# Patient Record
Sex: Female | Born: 1935 | Race: Black or African American | Hispanic: No | Smoking: Former smoker
Health system: Southern US, Community
[De-identification: ages and names within clinical notes are randomized; demographics above are authoritative.]

## PROBLEM LIST (undated history)

## (undated) DIAGNOSIS — I739 Peripheral vascular disease, unspecified: Secondary | ICD-10-CM

## (undated) DIAGNOSIS — R7989 Other specified abnormal findings of blood chemistry: Secondary | ICD-10-CM

## (undated) DIAGNOSIS — I10 Essential (primary) hypertension: Secondary | ICD-10-CM

## (undated) DIAGNOSIS — E876 Hypokalemia: Secondary | ICD-10-CM

## (undated) DIAGNOSIS — R001 Bradycardia, unspecified: Secondary | ICD-10-CM

## (undated) DIAGNOSIS — E039 Hypothyroidism, unspecified: Secondary | ICD-10-CM

## (undated) DIAGNOSIS — I2699 Other pulmonary embolism without acute cor pulmonale: Secondary | ICD-10-CM

## (undated) DIAGNOSIS — D631 Anemia in chronic kidney disease: Secondary | ICD-10-CM

## (undated) DIAGNOSIS — I714 Abdominal aortic aneurysm, without rupture: Secondary | ICD-10-CM

## (undated) DIAGNOSIS — F418 Other specified anxiety disorders: Secondary | ICD-10-CM

## (undated) DIAGNOSIS — F32A Depression, unspecified: Secondary | ICD-10-CM

## (undated) DIAGNOSIS — F419 Anxiety disorder, unspecified: Secondary | ICD-10-CM

## (undated) DIAGNOSIS — N186 End stage renal disease: Secondary | ICD-10-CM

## (undated) DIAGNOSIS — N039 Chronic nephritic syndrome with unspecified morphologic changes: Secondary | ICD-10-CM

## (undated) DIAGNOSIS — M48061 Spinal stenosis, lumbar region without neurogenic claudication: Secondary | ICD-10-CM

## (undated) DIAGNOSIS — K449 Diaphragmatic hernia without obstruction or gangrene: Secondary | ICD-10-CM

## (undated) DIAGNOSIS — D7589 Other specified diseases of blood and blood-forming organs: Secondary | ICD-10-CM

## (undated) DIAGNOSIS — F329 Major depressive disorder, single episode, unspecified: Secondary | ICD-10-CM

## (undated) DIAGNOSIS — D509 Iron deficiency anemia, unspecified: Secondary | ICD-10-CM

## (undated) DIAGNOSIS — D7289 Other specified disorders of white blood cells: Secondary | ICD-10-CM

## (undated) DIAGNOSIS — M1990 Unspecified osteoarthritis, unspecified site: Secondary | ICD-10-CM

## (undated) DIAGNOSIS — Z87898 Personal history of other specified conditions: Secondary | ICD-10-CM

## (undated) HISTORY — PX: DG AV DIALYSIS GRAFT DECLOT OR: HXRAD813

## (undated) HISTORY — DX: Personal history of other specified conditions: Z87.898

## (undated) HISTORY — DX: Bradycardia, unspecified: R00.1

## (undated) HISTORY — DX: Other specified diseases of blood and blood-forming organs: D75.89

## (undated) HISTORY — DX: Anemia in chronic kidney disease: D63.1

## (undated) HISTORY — DX: Other specified disorders of white blood cells: D72.89

## (undated) HISTORY — DX: Spinal stenosis, lumbar region without neurogenic claudication: M48.061

## (undated) HISTORY — DX: Anemia in chronic kidney disease: N03.9

## (undated) HISTORY — DX: Hypokalemia: E87.6

## (undated) HISTORY — PX: ABDOMINAL SURGERY: SHX537

## (undated) HISTORY — DX: Anxiety disorder, unspecified: F41.9

## (undated) HISTORY — DX: Other specified abnormal findings of blood chemistry: R79.89

## (undated) HISTORY — DX: Major depressive disorder, single episode, unspecified: F32.9

## (undated) HISTORY — DX: Depression, unspecified: F32.A

---

## 1997-03-08 DIAGNOSIS — I714 Abdominal aortic aneurysm, without rupture, unspecified: Secondary | ICD-10-CM

## 1997-03-08 HISTORY — DX: Abdominal aortic aneurysm, without rupture, unspecified: I71.40

## 1997-03-08 HISTORY — DX: Abdominal aortic aneurysm, without rupture: I71.4

## 1997-06-28 ENCOUNTER — Ambulatory Visit (HOSPITAL_COMMUNITY): Admission: RE | Admit: 1997-06-28 | Discharge: 1997-06-28 | Payer: Self-pay | Admitting: Family Medicine

## 1997-08-22 ENCOUNTER — Other Ambulatory Visit: Admission: RE | Admit: 1997-08-22 | Discharge: 1997-08-22 | Payer: Self-pay | Admitting: Family Medicine

## 2001-10-09 ENCOUNTER — Emergency Department (HOSPITAL_COMMUNITY): Admission: EM | Admit: 2001-10-09 | Discharge: 2001-10-09 | Payer: Self-pay | Admitting: Emergency Medicine

## 2001-10-10 ENCOUNTER — Emergency Department (HOSPITAL_COMMUNITY): Admission: EM | Admit: 2001-10-10 | Discharge: 2001-10-10 | Payer: Self-pay | Admitting: Emergency Medicine

## 2001-10-10 ENCOUNTER — Encounter: Payer: Self-pay | Admitting: Emergency Medicine

## 2001-10-21 ENCOUNTER — Encounter: Payer: Self-pay | Admitting: Emergency Medicine

## 2001-10-21 ENCOUNTER — Emergency Department (HOSPITAL_COMMUNITY): Admission: EM | Admit: 2001-10-21 | Discharge: 2001-10-21 | Payer: Self-pay | Admitting: Emergency Medicine

## 2003-03-06 ENCOUNTER — Ambulatory Visit (HOSPITAL_COMMUNITY): Admission: RE | Admit: 2003-03-06 | Discharge: 2003-03-06 | Payer: Self-pay | Admitting: Internal Medicine

## 2003-11-02 ENCOUNTER — Emergency Department (HOSPITAL_COMMUNITY): Admission: EM | Admit: 2003-11-02 | Discharge: 2003-11-02 | Payer: Self-pay | Admitting: Emergency Medicine

## 2003-11-07 ENCOUNTER — Emergency Department (HOSPITAL_COMMUNITY): Admission: EM | Admit: 2003-11-07 | Discharge: 2003-11-07 | Payer: Self-pay | Admitting: Emergency Medicine

## 2003-11-12 ENCOUNTER — Inpatient Hospital Stay (HOSPITAL_COMMUNITY): Admission: EM | Admit: 2003-11-12 | Discharge: 2003-11-20 | Payer: Self-pay | Admitting: Emergency Medicine

## 2003-11-22 ENCOUNTER — Encounter (HOSPITAL_COMMUNITY): Admission: RE | Admit: 2003-11-22 | Discharge: 2004-02-20 | Payer: Self-pay | Admitting: Endocrinology

## 2003-11-25 ENCOUNTER — Ambulatory Visit: Payer: Self-pay | Admitting: *Deleted

## 2003-12-26 ENCOUNTER — Emergency Department (HOSPITAL_COMMUNITY): Admission: EM | Admit: 2003-12-26 | Discharge: 2003-12-26 | Payer: Self-pay | Admitting: Emergency Medicine

## 2004-03-13 ENCOUNTER — Ambulatory Visit: Payer: Self-pay | Admitting: *Deleted

## 2004-03-13 ENCOUNTER — Ambulatory Visit: Payer: Self-pay | Admitting: Nurse Practitioner

## 2004-03-31 ENCOUNTER — Ambulatory Visit: Payer: Self-pay | Admitting: Nurse Practitioner

## 2004-06-11 ENCOUNTER — Ambulatory Visit: Payer: Self-pay | Admitting: Nurse Practitioner

## 2004-06-12 ENCOUNTER — Ambulatory Visit: Payer: Self-pay | Admitting: Internal Medicine

## 2004-08-12 ENCOUNTER — Other Ambulatory Visit: Admission: RE | Admit: 2004-08-12 | Discharge: 2004-08-12 | Payer: Self-pay | Admitting: Internal Medicine

## 2004-08-14 ENCOUNTER — Ambulatory Visit (HOSPITAL_COMMUNITY): Admission: RE | Admit: 2004-08-14 | Discharge: 2004-08-14 | Payer: Self-pay | Admitting: Internal Medicine

## 2005-10-07 ENCOUNTER — Ambulatory Visit (HOSPITAL_COMMUNITY): Admission: RE | Admit: 2005-10-07 | Discharge: 2005-10-07 | Payer: Self-pay | Admitting: Internal Medicine

## 2005-10-19 ENCOUNTER — Ambulatory Visit: Payer: Self-pay | Admitting: Cardiology

## 2005-11-29 ENCOUNTER — Ambulatory Visit: Payer: Self-pay | Admitting: Cardiology

## 2006-11-01 ENCOUNTER — Ambulatory Visit (HOSPITAL_COMMUNITY): Admission: RE | Admit: 2006-11-01 | Discharge: 2006-11-01 | Payer: Self-pay | Admitting: Internal Medicine

## 2007-08-16 ENCOUNTER — Ambulatory Visit (HOSPITAL_COMMUNITY): Admission: RE | Admit: 2007-08-16 | Discharge: 2007-08-16 | Payer: Self-pay | Admitting: Internal Medicine

## 2007-12-01 ENCOUNTER — Ambulatory Visit (HOSPITAL_COMMUNITY): Admission: RE | Admit: 2007-12-01 | Discharge: 2007-12-01 | Payer: Self-pay | Admitting: Internal Medicine

## 2008-07-31 ENCOUNTER — Encounter: Payer: Self-pay | Admitting: Physician Assistant

## 2008-09-05 ENCOUNTER — Emergency Department (HOSPITAL_COMMUNITY): Admission: EM | Admit: 2008-09-05 | Discharge: 2008-09-05 | Payer: Self-pay | Admitting: Emergency Medicine

## 2008-12-17 ENCOUNTER — Encounter: Admission: RE | Admit: 2008-12-17 | Discharge: 2008-12-17 | Payer: Self-pay | Admitting: Endocrinology

## 2008-12-18 ENCOUNTER — Inpatient Hospital Stay (HOSPITAL_COMMUNITY): Admission: EM | Admit: 2008-12-18 | Discharge: 2008-12-23 | Payer: Self-pay | Admitting: Emergency Medicine

## 2009-04-16 ENCOUNTER — Inpatient Hospital Stay (HOSPITAL_COMMUNITY): Admission: EM | Admit: 2009-04-16 | Discharge: 2009-04-26 | Payer: Self-pay | Admitting: Emergency Medicine

## 2009-04-17 ENCOUNTER — Ambulatory Visit: Payer: Self-pay | Admitting: Surgery

## 2009-04-18 ENCOUNTER — Encounter (INDEPENDENT_AMBULATORY_CARE_PROVIDER_SITE_OTHER): Payer: Self-pay | Admitting: Internal Medicine

## 2009-04-18 ENCOUNTER — Encounter: Payer: Self-pay | Admitting: Surgery

## 2009-04-18 HISTORY — PX: OTHER SURGICAL HISTORY: SHX169

## 2009-04-21 ENCOUNTER — Encounter (INDEPENDENT_AMBULATORY_CARE_PROVIDER_SITE_OTHER): Payer: Self-pay | Admitting: Internal Medicine

## 2009-06-18 ENCOUNTER — Ambulatory Visit (HOSPITAL_COMMUNITY): Admission: RE | Admit: 2009-06-18 | Discharge: 2009-06-18 | Payer: Self-pay | Admitting: Vascular Surgery

## 2009-06-18 ENCOUNTER — Ambulatory Visit: Payer: Self-pay | Admitting: Vascular Surgery

## 2009-08-07 ENCOUNTER — Ambulatory Visit (HOSPITAL_COMMUNITY): Admission: RE | Admit: 2009-08-07 | Discharge: 2009-08-07 | Payer: Self-pay | Admitting: Nephrology

## 2009-08-28 ENCOUNTER — Ambulatory Visit (HOSPITAL_COMMUNITY): Admission: AD | Admit: 2009-08-28 | Discharge: 2009-08-28 | Payer: Self-pay | Admitting: Vascular Surgery

## 2009-08-28 ENCOUNTER — Ambulatory Visit: Payer: Self-pay | Admitting: Vascular Surgery

## 2009-09-30 ENCOUNTER — Ambulatory Visit (HOSPITAL_COMMUNITY): Admission: RE | Admit: 2009-09-30 | Discharge: 2009-09-30 | Payer: Self-pay | Admitting: Nephrology

## 2009-11-15 ENCOUNTER — Ambulatory Visit: Payer: Self-pay | Admitting: Vascular Surgery

## 2009-11-15 ENCOUNTER — Ambulatory Visit (HOSPITAL_COMMUNITY): Admission: RE | Admit: 2009-11-15 | Discharge: 2009-11-15 | Payer: Self-pay | Admitting: Vascular Surgery

## 2009-11-28 ENCOUNTER — Emergency Department (HOSPITAL_COMMUNITY): Admission: EM | Admit: 2009-11-28 | Discharge: 2009-11-28 | Payer: Self-pay | Admitting: Emergency Medicine

## 2009-12-02 HISTORY — PX: AVGG REMOVAL: SHX5153

## 2009-12-03 ENCOUNTER — Ambulatory Visit (HOSPITAL_COMMUNITY): Admission: RE | Admit: 2009-12-03 | Discharge: 2009-12-03 | Payer: Self-pay | Admitting: Vascular Surgery

## 2009-12-14 ENCOUNTER — Other Ambulatory Visit: Payer: Self-pay | Admitting: Emergency Medicine

## 2009-12-14 ENCOUNTER — Inpatient Hospital Stay (HOSPITAL_COMMUNITY): Admission: EM | Admit: 2009-12-14 | Discharge: 2009-12-16 | Payer: Self-pay | Admitting: Internal Medicine

## 2009-12-22 ENCOUNTER — Ambulatory Visit: Payer: Self-pay | Admitting: Surgery

## 2010-01-09 ENCOUNTER — Ambulatory Visit: Payer: Self-pay | Admitting: Surgery

## 2010-01-09 ENCOUNTER — Ambulatory Visit (HOSPITAL_COMMUNITY): Admission: RE | Admit: 2010-01-09 | Discharge: 2010-01-09 | Payer: Self-pay | Admitting: Surgery

## 2010-01-09 HISTORY — PX: AV FISTULA PLACEMENT, RADIOCEPHALIC: SHX1208

## 2010-02-02 ENCOUNTER — Ambulatory Visit: Payer: Self-pay | Admitting: Surgery

## 2010-03-23 ENCOUNTER — Ambulatory Visit
Admission: RE | Admit: 2010-03-23 | Discharge: 2010-03-23 | Payer: Self-pay | Source: Home / Self Care | Attending: Surgery | Admitting: Surgery

## 2010-03-23 ENCOUNTER — Ambulatory Visit: Admit: 2010-03-23 | Payer: Self-pay | Admitting: Surgery

## 2010-03-28 ENCOUNTER — Encounter: Payer: Self-pay | Admitting: Internal Medicine

## 2010-03-29 ENCOUNTER — Encounter: Payer: Self-pay | Admitting: Internal Medicine

## 2010-03-29 ENCOUNTER — Encounter: Payer: Self-pay | Admitting: Nephrology

## 2010-03-30 ENCOUNTER — Encounter: Payer: Self-pay | Admitting: Nephrology

## 2010-04-06 NOTE — Procedures (Unsigned)
VASCULAR LAB EXAM  INDICATION:  Follow up left AV fistula duplex  HISTORY:  EXAM:  Left arm arteriovenous fistula duplex.  IMPRESSION: 1. Patent left radiocephalic arteriovenous fistula with no focal     stenosis noted. 2. There are 2 patent branches noted at the proximal forearm and     antecubital fossa levels, as described on the attached worksheet. 3. Turbulent flow is noted in a remnant segment of an old     arteriovenous graft noted at the antecubital fossa level. 4. Bidirectional (mostly retrograde) flow is noted in the left wrist     level radial artery. 5. Depth, diameter, and velocity measurements are noted on the     attached worksheet.     ___________________________________________ V. Charlena Cross, MD  CH/MEDQ  D:  03/24/2010  T:  03/24/2010  Job:  [id number]

## 2010-04-08 ENCOUNTER — Ambulatory Visit (HOSPITAL_COMMUNITY)
Admission: RE | Admit: 2010-04-08 | Discharge: 2010-04-08 | Disposition: A | Discharge status: Home / Self Care | Payer: No Typology Code available for payment source | Attending: Surgery | Admitting: Surgery

## 2010-04-08 DIAGNOSIS — Y849 Medical procedure, unspecified as the cause of abnormal reaction of the patient, or of later complication, without mention of misadventure at the time of the procedure: Secondary | ICD-10-CM | POA: Insufficient documentation

## 2010-04-08 DIAGNOSIS — N186 End stage renal disease: Secondary | ICD-10-CM

## 2010-04-08 DIAGNOSIS — T82598A Other mechanical complication of other cardiac and vascular devices and implants, initial encounter: Secondary | ICD-10-CM | POA: Insufficient documentation

## 2010-04-08 DIAGNOSIS — I120 Hypertensive chronic kidney disease with stage 5 chronic kidney disease or end stage renal disease: Secondary | ICD-10-CM

## 2010-04-08 DIAGNOSIS — T82898A Other specified complication of vascular prosthetic devices, implants and grafts, initial encounter: Secondary | ICD-10-CM

## 2010-04-19 NOTE — Op Note (Addendum)
  NAMEJENI, DULING                 ACCOUNT NO.:  [id number]  MEDICAL RECORD NO.:  [id number]           PATIENT TYPE:  O  LOCATION:  [facility]                         FACILITY:  [facility]  PHYSICIAN:  Juleen China IV, MDDATE OF BIRTH:  12/16/1932  DATE OF PROCEDURE:  04/08/2010 DATE OF DISCHARGE:                              OPERATIVE REPORT   PREOPERATIVE DIAGNOSIS:  Non-maturing left atrioventricular fistula.  POSTOPERATIVE DIAGNOSIS:  Non-maturing left atrioventricular fistula.  PROCEDURE PERFORMED: 1. Ultrasound access left cephalic vein. 2. Fistulogram.  INDICATIONS:  This is a 78 year old female with a left radiocephalic fistula that has not matured.  She comes in today for evaluation.  PROCEDURE:  The patient was identified in the holding area and taken to room 8, placed supine on the table.  The left arm was prepped and draped in usual fashion.  Time-out was called.  Ultrasound was used to evaluate the cephalic vein, it was small in caliber but patent.  A digital image was acquired, it was accessed under ultrasound guidance and the micropuncture needle.  A 0.018 Mandrel wire was advanced to the fistula without resistance.  Micropuncture sheath was placed.  Contrast injection was then performed through the sheath.  FINDINGS:  The cephalic vein is very small in caliber, approximately 2 mm in the upper arm.  At the antecubital crease, there are multiple branches going into the deep system.  The cephalic vein is patent in the upper arm but it continues to be small in caliber measuring approximately 2.5 mm.  Centrally, the subclavian vein is open proximally, however, contrast centrally goes through the internal jugular vein into the azygos system to get back centrally.  There appears to be a high-grade stenosis throughout the entire length of the innominate vein.  Based on these images, my initial plan was to proceed with balloon- assisted maturation; however, with the size  of the cephalic vein in addition to the innominate vein stenosis, I think this has very low yield and therefore I elected to abort the procedure with plans for discussions of a right-sided access.  Sheath will be removed in the holding area.  IMPRESSION: 1. Small size cephalic vein throughout the entire upper arm with     branches in the antecubital crease.  The vein     measured between 2 and 2.5 mm. 2. 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  The patient will be considered for right-sided access.     Jorge Ny, MD     VWB/MEDQ  D:  04/08/2010  T:  04/09/2010  Job:  [id number]  Electronically Signed by Arelia Longest IV MD on 04/19/2010 09:59:32 PM

## 2010-04-27 ENCOUNTER — Ambulatory Visit: Payer: PRIVATE HEALTH INSURANCE | Admitting: Surgery

## 2010-04-27 DIAGNOSIS — N186 End stage renal disease: Secondary | ICD-10-CM

## 2010-04-27 NOTE — Assessment & Plan Note (Signed)
OFFICE VISIT  Nicole, [name] DOB:  1932/07/07                                       04/27/2010 DGUYQ#:[id number]  The patient comes back in today for followup.  She had a left radiocephalic fistula placed following removal of an infected forearm graft.  Her vein has not matured as I would like therefore I got a fistulogram.  This showed a very small vein as well as significant central stenosis.  Based on those findings I did not feel that this would be a usable access for her.  She comes back in today to discuss new options.  PHYSICAL EXAMINATION:  Heart rate 67, blood pressure 214/79, temperature is 98.2.  General:  She is well-appearing, in no distress.  I do not really feel a significant thrill in her left arm fistula at this time. Cardiovascular:  Somewhat irregular today.  Lungs:  Respirations are nonlabored.  I reviewed her old vein mapping which shows that her right cephalic vein is acceptable for fistula placement.  It measures 0.28 to 0.4 in the forearm and 0.28 to 0.33 in the upper arm.  PLAN:  I have scheduled the patient to proceed with a right radiocephalic (Cimino) fistula on Friday March 2.  We discussed the risks of nonmaturity and the need for future interventions.  She understands all these and wishes to proceed.    Jorge Ny, MD Electronically Signed  VWB/MEDQ  D:  04/27/2010  T:  04/27/2010  Job:  [id number]  cc:   [facility]

## 2010-05-08 ENCOUNTER — Ambulatory Visit (HOSPITAL_COMMUNITY)
Admission: RE | Admit: 2010-05-08 | Discharge: 2010-05-08 | Disposition: A | Discharge status: Home / Self Care | Payer: No Typology Code available for payment source | Source: Ambulatory Visit | Attending: Surgery | Admitting: Surgery

## 2010-05-08 ENCOUNTER — Ambulatory Visit (HOSPITAL_COMMUNITY): Payer: No Typology Code available for payment source

## 2010-05-08 DIAGNOSIS — I120 Hypertensive chronic kidney disease with stage 5 chronic kidney disease or end stage renal disease: Secondary | ICD-10-CM

## 2010-05-08 DIAGNOSIS — E039 Hypothyroidism, unspecified: Secondary | ICD-10-CM | POA: Insufficient documentation

## 2010-05-08 DIAGNOSIS — Z01818 Encounter for other preprocedural examination: Secondary | ICD-10-CM | POA: Insufficient documentation

## 2010-05-08 DIAGNOSIS — N186 End stage renal disease: Secondary | ICD-10-CM | POA: Insufficient documentation

## 2010-05-08 DIAGNOSIS — Z01812 Encounter for preprocedural laboratory examination: Secondary | ICD-10-CM | POA: Insufficient documentation

## 2010-05-08 DIAGNOSIS — F17200 Nicotine dependence, unspecified, uncomplicated: Secondary | ICD-10-CM | POA: Insufficient documentation

## 2010-05-08 HISTORY — PX: AV FISTULA PLACEMENT, RADIOCEPHALIC: SHX1208

## 2010-05-08 LAB — SURGICAL PCR SCREEN: Staphylococcus aureus: NEGATIVE

## 2010-05-10 NOTE — Op Note (Addendum)
Nicole [name], Nicole [name]                 ACCOUNT NO.:  [id number]  MEDICAL RECORD NO.:  [id number]           PATIENT TYPE:  O  LOCATION:  [facility]                         FACILITY:  [facility]  PHYSICIAN:  Juleen China IV, MDDATE OF BIRTH:  04-11-1932  DATE OF PROCEDURE:  05/08/2010 DATE OF DISCHARGE:  05/08/2010                              OPERATIVE REPORT   PREOPERATIVE DIAGNOSIS:  End-stage renal disease.  POSTOPERATIVE DIAGNOSIS:  End-stage renal disease.  PROCEDURE PERFORMED:  Right radiocephalic fistula.  SURGEON: 1. Durene Cal IV, MD  ANESTHESIA:  MAC.  BLOOD LOSS:  Minimal.  FINDINGS:  Diseased radial artery, small vein.  PROCEDURE IN DETAILS:  The patient was identified in the holding area, taken to room 6, placed supine on the table.  MAC anesthesia was administered.  The patient was prepped and draped in usual fashion. Time-out was called.  Antibiotics were given.  Ultrasound was used to map course of cephalic vein in the forearm and upper arm.  It appeared to be about 3 mm by my ultrasound evaluation.  I made a longitudinal incision between the artery and vein just proximal to the wrist.  The vein was dissected out.  It had a spasm down to less than 1.5 mm; however, with ultrasound appearance of the vein, I elected to proceed with dissecting out of this vein.  The vein was mobilized proximally and distally throughout the length of the incision.  It was marked with an ink pen for orientation.  Next, I dissected out the radial artery.  The radial artery was also rather small.  It was 1.5 mm.  It did have some calcified plaque around it, however, there are multiple soft areas where I felt it was adequate for an anastomosis.  At this point the patient was heparinized.  The vein was then occluded with a right angle, I transected distally and the distal end was tied off with a 3-0 silk tie. I then was able to dilate the vein with sequential dilators up to a #3 dilator.   The artery was occluded.  It was opened with a #11 blade.  I did not get good inflow and therefore I passed a 3 Fogarty across where the artery had been clamped and this established a good inflow.  The artery was then opened longitudinally and end-to-side anastomosis was created with running 7-0 Prolene.  Prior to completion, appropriate flush maneuvers were performed.  I then completed the anastomosis.  At this point I had a signal in the vein as well as the proximal artery, however I did not hear a signal within the distal radial artery.  There was a multiphasic ulnar signal however.  I felt that I wanted to make sure there was no spasm, my hope made a small opening in the hood of the anastomosis and passed a 1.5 dilator which went easily across where the clamp had been.  There was some improved backbleeding.  I then closed the venotomy with 7-0 Prolene.  I reevaluated with Doppler.  There was a faint signal in the distal radial artery at this point.  I did  not augment or change with occlusion of the fistula.  Next, I evaluated the fistula.  I could get a signal up to the mid forearm.  At this point I was not very enthusiastic about the long-term success of this fistula. However, I did feel like it was worthwhile to see if this would be majority of the reason.  Hemostasis was achieved.  I then reapproximated the soft tissue with 3-0 Vicryl and closed the skin with 3- 0 Vicryl.  Dermabond was placed on the wound.  The patient was then reevaluated with Doppler signal.  There was a signal in the fistula. Down near the wrist there was a radial artery signal.  The patient tolerated the procedure well without acute complications.     Jorge Ny, MD     VWB/MEDQ  D:  05/08/2010  T:  05/08/2010  Job:  [id number]  Electronically Signed by Arelia Longest IV MD on 05/10/2010 07:41:09 PM

## 2010-05-19 LAB — SURGICAL PCR SCREEN: MRSA, PCR: NEGATIVE

## 2010-05-19 LAB — POCT I-STAT 4, (NA,K, GLUC, HGB,HCT)
HCT: 43 % (ref 36.0–46.0)
Hemoglobin: 14.6 g/dL (ref 12.0–15.0)
Sodium: 140 mEq/L (ref 135–145)

## 2010-05-20 LAB — CBC
Hemoglobin: 6.5 g/dL — CL (ref 12.0–15.0)
MCH: 35.7 pg — ABNORMAL HIGH (ref 26.0–34.0)
RDW: 19.4 % — ABNORMAL HIGH (ref 11.5–15.5)
WBC: 9.4 10*3/uL (ref 4.0–10.5)

## 2010-05-20 LAB — COMPREHENSIVE METABOLIC PANEL
ALT: 15 U/L (ref 0–35)
AST: 44 U/L — ABNORMAL HIGH (ref 0–37)
Albumin: 2.5 g/dL — ABNORMAL LOW (ref 3.5–5.2)
Alkaline Phosphatase: 54 U/L (ref 39–117)
BUN: 34 mg/dL — ABNORMAL HIGH (ref 6–23)
CO2: 26 mEq/L (ref 19–32)
Calcium: 8.4 mg/dL (ref 8.4–10.5)
Creatinine, Ser: 9.23 mg/dL — ABNORMAL HIGH (ref 0.4–1.2)
GFR calc Af Amer: 5 mL/min — ABNORMAL LOW (ref >60)
Glucose, Bld: 93 mg/dL (ref 70–99)
Potassium: 5 mEq/L (ref 3.5–5.1)
Total Bilirubin: 0.7 mg/dL (ref 0.3–1.2)
Total Protein: 6.5 g/dL (ref 6.0–8.3)

## 2010-05-20 LAB — CROSSMATCH
ABO/RH(D): A POS
Donor AG Type: NEGATIVE

## 2010-05-20 LAB — DIFFERENTIAL
Basophils Absolute: 0 10*3/uL (ref 0.0–0.1)
Basophils Relative: 0 % (ref 0–1)
Eosinophils Relative: 2 % (ref 0–5)
Neutro Abs: 5.9 10*3/uL (ref 1.7–7.7)

## 2010-05-20 LAB — PHOSPHORUS: Phosphorus: 6 mg/dL — ABNORMAL HIGH (ref 2.3–4.6)

## 2010-05-21 LAB — CROSSMATCH
Antibody Screen: POSITIVE
Donor AG Type: NEGATIVE

## 2010-05-21 LAB — HEMOCCULT GUIAC POC 1CARD (OFFICE): Fecal Occult Bld: NEGATIVE

## 2010-05-21 LAB — RENAL FUNCTION PANEL
Albumin: 2.6 g/dL — ABNORMAL LOW (ref 3.5–5.2)
BUN: 27 mg/dL — ABNORMAL HIGH (ref 6–23)
CO2: 33 mEq/L — ABNORMAL HIGH (ref 19–32)
Chloride: 99 mEq/L (ref 96–112)
GFR calc Af Amer: 6 mL/min — ABNORMAL LOW (ref >60)
GFR calc non Af Amer: 4 mL/min — ABNORMAL LOW (ref >60)
GFR calc non Af Amer: 5 mL/min — ABNORMAL LOW (ref >60)
Glucose, Bld: 83 mg/dL (ref 70–99)
Phosphorus: 6.4 mg/dL — ABNORMAL HIGH (ref 2.3–4.6)
Potassium: 4.7 mEq/L (ref 3.5–5.1)
Potassium: 5 mEq/L (ref 3.5–5.1)
Sodium: 139 mEq/L (ref 135–145)
Sodium: 140 mEq/L (ref 135–145)

## 2010-05-21 LAB — CBC
HCT: 17.9 % — ABNORMAL LOW (ref 36.0–46.0)
HCT: 27.6 % — ABNORMAL LOW (ref 36.0–46.0)
Hemoglobin: 10.6 g/dL — ABNORMAL LOW (ref 12.0–15.0)
Hemoglobin: 5.8 g/dL — CL (ref 12.0–15.0)
Hemoglobin: 9.1 g/dL — ABNORMAL LOW (ref 12.0–15.0)
MCH: 33.1 pg (ref 26.0–34.0)
MCH: 33.2 pg (ref 26.0–34.0)
MCHC: 33 g/dL (ref 30.0–36.0)
Platelets: 345 10*3/uL (ref 150–400)
Platelets: 354 10*3/uL (ref 150–400)
Platelets: 371 10*3/uL (ref 150–400)
RBC: 1.65 MIL/uL — ABNORMAL LOW (ref 3.87–5.11)
RBC: 2.99 MIL/uL — ABNORMAL LOW (ref 3.87–5.11)
RBC: 3.19 MIL/uL — ABNORMAL LOW (ref 3.87–5.11)
RBC: 3.26 MIL/uL — ABNORMAL LOW (ref 3.87–5.11)
RDW: 19.6 % — ABNORMAL HIGH (ref 11.5–15.5)
RDW: 20.4 % — ABNORMAL HIGH (ref 11.5–15.5)
RDW: 16.5 % — ABNORMAL HIGH (ref 11.5–15.5)
WBC: 11.3 10*3/uL — ABNORMAL HIGH (ref 4.0–10.5)
WBC: 7.8 10*3/uL (ref 4.0–10.5)
WBC: 8 10*3/uL (ref 4.0–10.5)
WBC: 9.2 10*3/uL (ref 4.0–10.5)
WBC: 9.6 10*3/uL (ref 4.0–10.5)

## 2010-05-21 LAB — DIFFERENTIAL
Basophils Absolute: 0 10*3/uL (ref 0.0–0.1)
Basophils Relative: 0 % (ref 0–1)
Lymphocytes Relative: 25 % (ref 12–46)
Monocytes Relative: 10 % (ref 3–12)
Neutro Abs: 6.1 10*3/uL (ref 1.7–7.7)
Neutrophils Relative %: 64 % (ref 43–77)

## 2010-05-21 LAB — RETICULOCYTES: RBC.: 3.3 MIL/uL — ABNORMAL LOW (ref 3.87–5.11)

## 2010-05-21 LAB — WOUND CULTURE
Culture: NO GROWTH
Gram Stain: NONE SEEN

## 2010-05-21 LAB — BASIC METABOLIC PANEL
CO2: 29 mEq/L (ref 19–32)
Calcium: 8.5 mg/dL (ref 8.4–10.5)
Calcium: 8.6 mg/dL (ref 8.4–10.5)
Creatinine, Ser: 5.58 mg/dL — ABNORMAL HIGH (ref 0.4–1.2)
GFR calc Af Amer: 9 mL/min — ABNORMAL LOW (ref >60)
GFR calc non Af Amer: 7 mL/min — ABNORMAL LOW (ref >60)
GFR calc non Af Amer: 5 mL/min — ABNORMAL LOW (ref >60)
Glucose, Bld: 137 mg/dL — ABNORMAL HIGH (ref 70–99)
Potassium: 2.7 mEq/L — CL (ref 3.5–5.1)
Sodium: 141 mEq/L (ref 135–145)
Sodium: 141 mEq/L (ref 135–145)

## 2010-05-21 LAB — PHOSPHORUS
Phosphorus: 5.7 mg/dL — ABNORMAL HIGH (ref 2.3–4.6)
Phosphorus: 3.8 mg/dL (ref 2.3–4.6)

## 2010-05-21 LAB — POCT I-STAT 4, (NA,K, GLUC, HGB,HCT)
Glucose, Bld: 97 mg/dL (ref 70–99)
HCT: 31 % — ABNORMAL LOW (ref 36.0–46.0)
Hemoglobin: 10.5 g/dL — ABNORMAL LOW (ref 12.0–15.0)
Potassium: 4.4 mEq/L (ref 3.5–5.1)
Potassium: 3.8 mEq/L (ref 3.5–5.1)
Sodium: 141 mEq/L (ref 135–145)

## 2010-05-21 LAB — IRON AND TIBC
Iron: 124 ug/dL (ref 42–135)
Saturation Ratios: 58 % — ABNORMAL HIGH (ref 20–55)
UIBC: 89 ug/dL

## 2010-05-21 LAB — MAGNESIUM: Magnesium: 2 mg/dL (ref 1.5–2.5)

## 2010-05-21 LAB — TSH: TSH: 82.91 u[IU]/mL — ABNORMAL HIGH (ref 0.350–4.500)

## 2010-05-21 LAB — VITAMIN B12: Vitamin B-12: 1508 pg/mL — ABNORMAL HIGH (ref 211–911)

## 2010-05-23 LAB — POTASSIUM: Potassium: 3.2 mEq/L — ABNORMAL LOW (ref 3.5–5.1)

## 2010-05-24 LAB — POCT I-STAT 4, (NA,K, GLUC, HGB,HCT): HCT: 36 % (ref 36.0–46.0)

## 2010-05-25 ENCOUNTER — Ambulatory Visit (INDEPENDENT_AMBULATORY_CARE_PROVIDER_SITE_OTHER): Payer: No Typology Code available for payment source | Admitting: Surgery

## 2010-05-25 DIAGNOSIS — N186 End stage renal disease: Secondary | ICD-10-CM

## 2010-05-25 LAB — POTASSIUM: Potassium: 3.4 mEq/L — ABNORMAL LOW (ref 3.5–5.1)

## 2010-05-26 NOTE — Assessment & Plan Note (Addendum)
OFFICE VISIT  Nicole [name], Nicole [name] DOB:  1932/05/20                                       05/25/2010 [id number]#:[id number]  The patient comes back today for followup.  She is status post right radiocephalic fistula on 05/08/2010.  Intraoperative findings included a diseased radial artery and small vein.  The patient is back today for followup.  I am not very impressed with the thrill within her fistula. Since she is only 2 weeks out I am going to give her another 2-3 weeks to see if this shows any improvement.  I will have her follow up with me at that time.  We will repeat an ultrasound to see if there is any technical explanation for why this is not maturing other than just it being a small vein and then we will proceed most likely with an upper arm access, fistula.    Jorge Ny, MD Electronically Signed  VWB/MEDQ  D:  05/25/2010  T:  05/26/2010  Job:  [id number]  cc:   [facility]

## 2010-05-27 LAB — CBC
HCT: 22.5 % — ABNORMAL LOW (ref 36.0–46.0)
HCT: 27.4 % — ABNORMAL LOW (ref 36.0–46.0)
HCT: 28.8 % — ABNORMAL LOW (ref 36.0–46.0)
HCT: 29.6 % — ABNORMAL LOW (ref 36.0–46.0)
HCT: 29.8 % — ABNORMAL LOW (ref 36.0–46.0)
Hemoglobin: 10.4 g/dL — ABNORMAL LOW (ref 12.0–15.0)
Hemoglobin: 7.5 g/dL — ABNORMAL LOW (ref 12.0–15.0)
Hemoglobin: 9.9 g/dL — ABNORMAL LOW (ref 12.0–15.0)
MCHC: 33.6 g/dL (ref 30.0–36.0)
MCHC: 34.2 g/dL (ref 30.0–36.0)
MCHC: 34.2 g/dL (ref 30.0–36.0)
MCHC: 34.5 g/dL (ref 30.0–36.0)
MCHC: 35 g/dL (ref 30.0–36.0)
MCV: 91.3 fL (ref 78.0–100.0)
MCV: 92.1 fL (ref 78.0–100.0)
MCV: 92.7 fL (ref 78.0–100.0)
MCV: 92.7 fL (ref 78.0–100.0)
MCV: 93.4 fL (ref 78.0–100.0)
Platelets: 140 10*3/uL — ABNORMAL LOW (ref 150–400)
Platelets: 156 10*3/uL (ref 150–400)
Platelets: 167 10*3/uL (ref 150–400)
Platelets: 223 10*3/uL (ref 150–400)
Platelets: 271 10*3/uL (ref 150–400)
Platelets: 275 10*3/uL (ref 150–400)
RBC: 3.19 MIL/uL — ABNORMAL LOW (ref 3.87–5.11)
RBC: 3.27 MIL/uL — ABNORMAL LOW (ref 3.87–5.11)
RDW: 14.6 % (ref 11.5–15.5)
RDW: 14.9 % (ref 11.5–15.5)
RDW: 15 % (ref 11.5–15.5)
RDW: 15.1 % (ref 11.5–15.5)
RDW: 15.5 % (ref 11.5–15.5)
WBC: 11 10*3/uL — ABNORMAL HIGH (ref 4.0–10.5)
WBC: 6.3 10*3/uL (ref 4.0–10.5)
WBC: 6.9 10*3/uL (ref 4.0–10.5)

## 2010-05-27 LAB — POCT I-STAT, CHEM 8
BUN: 79 mg/dL — ABNORMAL HIGH (ref 6–23)
Chloride: 117 mEq/L — ABNORMAL HIGH (ref 96–112)
Chloride: 118 mEq/L — ABNORMAL HIGH (ref 96–112)
HCT: 22 % — ABNORMAL LOW (ref 36.0–46.0)
HCT: 23 % — ABNORMAL LOW (ref 36.0–46.0)
Hemoglobin: 7.5 g/dL — ABNORMAL LOW (ref 12.0–15.0)
Potassium: 5.4 mEq/L — ABNORMAL HIGH (ref 3.5–5.1)
Sodium: 135 mEq/L (ref 135–145)
Sodium: 137 mEq/L (ref 135–145)
TCO2: 15 mmol/L (ref 0–100)

## 2010-05-27 LAB — RENAL FUNCTION PANEL
Albumin: 2.1 g/dL — ABNORMAL LOW (ref 3.5–5.2)
Albumin: 2.2 g/dL — ABNORMAL LOW (ref 3.5–5.2)
Albumin: 2.2 g/dL — ABNORMAL LOW (ref 3.5–5.2)
Albumin: 2.3 g/dL — ABNORMAL LOW (ref 3.5–5.2)
Albumin: 2.6 g/dL — ABNORMAL LOW (ref 3.5–5.2)
BUN: 30 mg/dL — ABNORMAL HIGH (ref 6–23)
BUN: 45 mg/dL — ABNORMAL HIGH (ref 6–23)
BUN: 63 mg/dL — ABNORMAL HIGH (ref 6–23)
CO2: 20 mEq/L (ref 19–32)
CO2: 22 mEq/L (ref 19–32)
CO2: 26 mEq/L (ref 19–32)
Calcium: 7.5 mg/dL — ABNORMAL LOW (ref 8.4–10.5)
Calcium: 7.6 mg/dL — ABNORMAL LOW (ref 8.4–10.5)
Calcium: 7.6 mg/dL — ABNORMAL LOW (ref 8.4–10.5)
Calcium: 7.6 mg/dL — ABNORMAL LOW (ref 8.4–10.5)
Calcium: 7.9 mg/dL — ABNORMAL LOW (ref 8.4–10.5)
Calcium: 7.9 mg/dL — ABNORMAL LOW (ref 8.4–10.5)
Chloride: 101 mEq/L (ref 96–112)
Chloride: 96 mEq/L (ref 96–112)
Creatinine, Ser: 11.71 mg/dL — ABNORMAL HIGH (ref 0.4–1.2)
Creatinine, Ser: 6.61 mg/dL — ABNORMAL HIGH (ref 0.4–1.2)
Creatinine, Ser: 7.92 mg/dL — ABNORMAL HIGH (ref 0.4–1.2)
Creatinine, Ser: 8.49 mg/dL — ABNORMAL HIGH (ref 0.4–1.2)
GFR calc Af Amer: 4 mL/min — ABNORMAL LOW (ref >60)
GFR calc Af Amer: 5 mL/min — ABNORMAL LOW (ref >60)
GFR calc Af Amer: 6 mL/min — ABNORMAL LOW (ref >60)
GFR calc Af Amer: 6 mL/min — ABNORMAL LOW (ref >60)
GFR calc Af Amer: 7 mL/min — ABNORMAL LOW (ref >60)
GFR calc non Af Amer: 3 mL/min — ABNORMAL LOW (ref >60)
GFR calc non Af Amer: 4 mL/min — ABNORMAL LOW (ref >60)
GFR calc non Af Amer: 5 mL/min — ABNORMAL LOW (ref >60)
GFR calc non Af Amer: 5 mL/min — ABNORMAL LOW (ref >60)
Glucose, Bld: 106 mg/dL — ABNORMAL HIGH (ref 70–99)
Glucose, Bld: 107 mg/dL — ABNORMAL HIGH (ref 70–99)
Glucose, Bld: 108 mg/dL — ABNORMAL HIGH (ref 70–99)
Glucose, Bld: 115 mg/dL — ABNORMAL HIGH (ref 70–99)
Phosphorus: 3.2 mg/dL (ref 2.3–4.6)
Phosphorus: 5.1 mg/dL — ABNORMAL HIGH (ref 2.3–4.6)
Phosphorus: 5.2 mg/dL — ABNORMAL HIGH (ref 2.3–4.6)
Phosphorus: 6.6 mg/dL — ABNORMAL HIGH (ref 2.3–4.6)
Potassium: 4.1 mEq/L (ref 3.5–5.1)
Potassium: 4.1 mEq/L (ref 3.5–5.1)
Sodium: 133 mEq/L — ABNORMAL LOW (ref 135–145)
Sodium: 133 mEq/L — ABNORMAL LOW (ref 135–145)
Sodium: 136 mEq/L (ref 135–145)
Sodium: 137 mEq/L (ref 135–145)

## 2010-05-27 LAB — POCT I-STAT 3, ART BLOOD GAS (G3+)
Bicarbonate: 18.7 mEq/L — ABNORMAL LOW (ref 20.0–24.0)
pH, Arterial: 7.444 — ABNORMAL HIGH (ref 7.350–7.400)
pO2, Arterial: 56 mmHg — ABNORMAL LOW (ref 80.0–100.0)

## 2010-05-27 LAB — CROSSMATCH
Antibody Screen: POSITIVE
DAT, IgG: NEGATIVE

## 2010-05-27 LAB — URINE MICROSCOPIC-ADD ON

## 2010-05-27 LAB — DIFFERENTIAL
Basophils Absolute: 0 10*3/uL (ref 0.0–0.1)
Eosinophils Relative: 1 % (ref 0–5)
Lymphocytes Relative: 12 % (ref 12–46)
Neutro Abs: 5.3 10*3/uL (ref 1.7–7.7)

## 2010-05-27 LAB — URINALYSIS, ROUTINE W REFLEX MICROSCOPIC
Bilirubin Urine: NEGATIVE
Glucose, UA: NEGATIVE mg/dL
Ketones, ur: NEGATIVE mg/dL
Nitrite: NEGATIVE
Protein, ur: >300 mg/dL — AB
Protein, ur: >300 mg/dL — AB
Specific Gravity, Urine: 1.017 (ref 1.005–1.030)
Urobilinogen, UA: 0.2 mg/dL (ref 0.0–1.0)
Urobilinogen, UA: 0.2 mg/dL (ref 0.0–1.0)

## 2010-05-27 LAB — HEMOCCULT GUIAC POC 1CARD (OFFICE): Fecal Occult Bld: NEGATIVE

## 2010-05-27 LAB — HEPATITIS B SURFACE ANTIBODY,QUALITATIVE: Hep B S Ab: POSITIVE — AB

## 2010-05-27 LAB — PTH, INTACT AND CALCIUM: Calcium, Total (PTH): 7.5 mg/dL — ABNORMAL LOW (ref 8.4–10.5)

## 2010-05-27 LAB — TSH: TSH: 27.552 u[IU]/mL — ABNORMAL HIGH (ref 0.350–4.500)

## 2010-05-27 LAB — CULTURE, BLOOD (ROUTINE X 2)
Culture: NO GROWTH
Culture: NO GROWTH

## 2010-05-27 LAB — MRSA PCR SCREENING: MRSA by PCR: NEGATIVE

## 2010-05-27 LAB — URINE CULTURE: Colony Count: 70000

## 2010-05-27 LAB — IRON AND TIBC
Iron: 15 ug/dL — ABNORMAL LOW (ref 42–135)
UIBC: 135 ug/dL

## 2010-05-27 LAB — ALT: ALT: 16 U/L (ref 0–35)

## 2010-05-27 LAB — BRAIN NATRIURETIC PEPTIDE: Pro B Natriuretic peptide (BNP): >3200 pg/mL — ABNORMAL HIGH (ref 0.0–100.0)

## 2010-05-27 LAB — POCT CARDIAC MARKERS: Myoglobin, poc: >500 ng/mL (ref 12–200)

## 2010-06-11 LAB — CBC
HCT: 21.2 % — ABNORMAL LOW (ref 36.0–46.0)
HCT: 24.4 % — ABNORMAL LOW (ref 36.0–46.0)
Hemoglobin: 10.1 g/dL — ABNORMAL LOW (ref 12.0–15.0)
Hemoglobin: 8.3 g/dL — ABNORMAL LOW (ref 12.0–15.0)
Hemoglobin: 9.8 g/dL — ABNORMAL LOW (ref 12.0–15.0)
MCHC: 33.4 g/dL (ref 30.0–36.0)
MCHC: 34.3 g/dL (ref 30.0–36.0)
MCHC: 34.4 g/dL (ref 30.0–36.0)
MCV: 94.4 fL (ref 78.0–100.0)
Platelets: 255 10*3/uL (ref 150–400)
Platelets: 261 10*3/uL (ref 150–400)
RBC: 2.58 MIL/uL — ABNORMAL LOW (ref 3.87–5.11)
RBC: 3.05 MIL/uL — ABNORMAL LOW (ref 3.87–5.11)
RBC: 3.14 MIL/uL — ABNORMAL LOW (ref 3.87–5.11)
RBC: 3.14 MIL/uL — ABNORMAL LOW (ref 3.87–5.11)
RDW: 14.9 % (ref 11.5–15.5)
RDW: 15.2 % (ref 11.5–15.5)
RDW: 15.5 % (ref 11.5–15.5)
RDW: 15.6 % — ABNORMAL HIGH (ref 11.5–15.5)
RDW: 15.7 % — ABNORMAL HIGH (ref 11.5–15.5)
WBC: 8.3 10*3/uL (ref 4.0–10.5)
WBC: 9.9 10*3/uL (ref 4.0–10.5)

## 2010-06-11 LAB — UIFE/LIGHT CHAINS/TP QN, 24-HR UR
Alpha 1, Urine: DETECTED — AB
Alpha 1, Urine: DETECTED — AB
Alpha 2, Urine: DETECTED — AB
Beta, Urine: DETECTED — AB
Free Kappa Lt Chains,Ur: 13.1 mg/dL — ABNORMAL HIGH (ref 0.04–1.51)
Free Kappa Lt Chains,Ur: 7.9 mg/dL — ABNORMAL HIGH (ref 0.04–1.51)
Free Kappa/Lambda Ratio: 2.14 ratio (ref 0.46–4.00)
Free Lambda Excretion/Day: 71.03 mg/d
Gamma Globulin, Urine: DETECTED — AB
Time: 24 hours
Total Protein, Urine: 118.6 mg/dL
Volume, Urine: 1925 mL

## 2010-06-11 LAB — URINALYSIS, ROUTINE W REFLEX MICROSCOPIC
Glucose, UA: NEGATIVE mg/dL
Ketones, ur: NEGATIVE mg/dL
Leukocytes, UA: NEGATIVE
Leukocytes, UA: NEGATIVE
Protein, ur: >300 mg/dL — AB
Protein, ur: >300 mg/dL — AB
Specific Gravity, Urine: 1.013 (ref 1.005–1.030)
Urobilinogen, UA: 0.2 mg/dL (ref 0.0–1.0)
pH: 6 (ref 5.0–8.0)

## 2010-06-11 LAB — PROTEIN ELECTROPHORESIS, SERUM
Beta 2: 9 % — ABNORMAL HIGH (ref 3.2–6.5)
Beta Globulin: 6.3 % (ref 4.7–7.2)
M-Spike, %: NOT DETECTED g/dL
Total Protein ELP: 5.5 g/dL — ABNORMAL LOW (ref 6.0–8.3)

## 2010-06-11 LAB — RENAL FUNCTION PANEL
Albumin: 2.2 g/dL — ABNORMAL LOW (ref 3.5–5.2)
Albumin: 2.3 g/dL — ABNORMAL LOW (ref 3.5–5.2)
Albumin: 2.3 g/dL — ABNORMAL LOW (ref 3.5–5.2)
BUN: 36 mg/dL — ABNORMAL HIGH (ref 6–23)
CO2: 17 mEq/L — ABNORMAL LOW (ref 19–32)
Calcium: 8 mg/dL — ABNORMAL LOW (ref 8.4–10.5)
Chloride: 112 mEq/L (ref 96–112)
Chloride: 112 mEq/L (ref 96–112)
Chloride: 117 mEq/L — ABNORMAL HIGH (ref 96–112)
Creatinine, Ser: 4.55 mg/dL — ABNORMAL HIGH (ref 0.4–1.2)
Creatinine, Ser: 4.65 mg/dL — ABNORMAL HIGH (ref 0.4–1.2)
Creatinine, Ser: 5.49 mg/dL — ABNORMAL HIGH (ref 0.4–1.2)
GFR calc Af Amer: 10 mL/min — ABNORMAL LOW (ref >60)
GFR calc Af Amer: 11 mL/min — ABNORMAL LOW (ref >60)
GFR calc Af Amer: 11 mL/min — ABNORMAL LOW (ref >60)
GFR calc Af Amer: 9 mL/min — ABNORMAL LOW (ref >60)
GFR calc non Af Amer: 8 mL/min — ABNORMAL LOW (ref >60)
GFR calc non Af Amer: 8 mL/min — ABNORMAL LOW (ref >60)
GFR calc non Af Amer: 9 mL/min — ABNORMAL LOW (ref >60)
Phosphorus: 5.1 mg/dL — ABNORMAL HIGH (ref 2.3–4.6)
Potassium: 4.7 mEq/L (ref 3.5–5.1)
Potassium: 5.2 mEq/L — ABNORMAL HIGH (ref 3.5–5.1)
Sodium: 135 mEq/L (ref 135–145)
Sodium: 142 mEq/L (ref 135–145)

## 2010-06-11 LAB — HEMOGLOBIN A1C: Mean Plasma Glucose: 105 mg/dL

## 2010-06-11 LAB — COMPREHENSIVE METABOLIC PANEL
ALT: 15 U/L (ref 0–35)
AST: 7 U/L (ref 0–37)
Albumin: 2.2 g/dL — ABNORMAL LOW (ref 3.5–5.2)
Alkaline Phosphatase: 76 U/L (ref 39–117)
BUN: 31 mg/dL — ABNORMAL HIGH (ref 6–23)
BUN: 35 mg/dL — ABNORMAL HIGH (ref 6–23)
CO2: 18 mEq/L — ABNORMAL LOW (ref 19–32)
Calcium: 7.5 mg/dL — ABNORMAL LOW (ref 8.4–10.5)
Chloride: 115 mEq/L — ABNORMAL HIGH (ref 96–112)
Creatinine, Ser: 4.73 mg/dL — ABNORMAL HIGH (ref 0.4–1.2)
GFR calc Af Amer: 11 mL/min — ABNORMAL LOW (ref >60)
GFR calc non Af Amer: 8 mL/min — ABNORMAL LOW (ref >60)
Glucose, Bld: 113 mg/dL — ABNORMAL HIGH (ref 70–99)
Potassium: 4.8 mEq/L (ref 3.5–5.1)
Sodium: 141 mEq/L (ref 135–145)
Total Bilirubin: 0.7 mg/dL (ref 0.3–1.2)
Total Protein: 5.5 g/dL — ABNORMAL LOW (ref 6.0–8.3)
Total Protein: 6.7 g/dL (ref 6.0–8.3)

## 2010-06-11 LAB — CROSSMATCH: Antibody Screen: NEGATIVE

## 2010-06-11 LAB — URINE MICROSCOPIC-ADD ON

## 2010-06-11 LAB — DIFFERENTIAL
Basophils Absolute: 0 10*3/uL (ref 0.0–0.1)
Basophils Relative: 0 % (ref 0–1)
Eosinophils Absolute: 0.2 10*3/uL (ref 0.0–0.7)
Neutro Abs: 5.3 10*3/uL (ref 1.7–7.7)
Neutrophils Relative %: 75 % (ref 43–77)

## 2010-06-11 LAB — HEMOCCULT GUIAC POC 1CARD (OFFICE): Fecal Occult Bld: NEGATIVE

## 2010-06-11 LAB — GLUCOSE, CAPILLARY: Glucose-Capillary: 92 mg/dL (ref 70–99)

## 2010-06-11 LAB — PTH, INTACT AND CALCIUM: Calcium, Total (PTH): 7.7 mg/dL — ABNORMAL LOW (ref 8.4–10.5)

## 2010-06-11 LAB — RETICULOCYTES
RBC.: 2.53 MIL/uL — ABNORMAL LOW (ref 3.87–5.11)
Retic Count, Absolute: 25.3 10*3/uL (ref 19.0–186.0)

## 2010-06-11 LAB — LIPID PANEL
Cholesterol: 114 mg/dL (ref 0–200)
HDL: 34 mg/dL — ABNORMAL LOW (ref >39)
LDL Cholesterol: 59 mg/dL (ref 0–99)
Total CHOL/HDL Ratio: 3.4 RATIO
Triglycerides: 106 mg/dL (ref <150)

## 2010-06-11 LAB — MICROALBUMIN / CREATININE URINE RATIO
Microalb Creat Ratio: 2126.9 mg/g — ABNORMAL HIGH (ref 0.0–30.0)
Microalb, Ur: 71.04 mg/dL — ABNORMAL HIGH (ref 0.00–1.89)

## 2010-06-11 LAB — FERRITIN: Ferritin: 92 ng/mL (ref 10–291)

## 2010-06-11 LAB — FOLATE: Folate: >20 ng/mL

## 2010-06-11 LAB — MAGNESIUM: Magnesium: 1.7 mg/dL (ref 1.5–2.5)

## 2010-06-11 LAB — IRON AND TIBC
Iron: 35 ug/dL — ABNORMAL LOW (ref 42–135)
TIBC: 213 ug/dL — ABNORMAL LOW (ref 250–470)

## 2010-06-15 ENCOUNTER — Ambulatory Visit (INDEPENDENT_AMBULATORY_CARE_PROVIDER_SITE_OTHER): Payer: No Typology Code available for payment source | Admitting: Surgery

## 2010-06-15 ENCOUNTER — Encounter (INDEPENDENT_AMBULATORY_CARE_PROVIDER_SITE_OTHER): Payer: No Typology Code available for payment source

## 2010-06-15 DIAGNOSIS — N186 End stage renal disease: Secondary | ICD-10-CM

## 2010-06-15 DIAGNOSIS — T82598A Other mechanical complication of other cardiac and vascular devices and implants, initial encounter: Secondary | ICD-10-CM

## 2010-06-16 NOTE — Assessment & Plan Note (Signed)
OFFICE VISIT  Nicole [name], Nicole [name] DOB:  1932-09-27                                       06/15/2010 YQMVH#:[id number]  The patient comes back in today for followup of her right radiocephalic fistula which was placed on 05/08/2010.  I was not very pleased with the thrill within her vein at her first postoperative visit.  She comes back in today for evaluation with an ultrasound.  On examination the thrill is much more prominent, however, I still have some reservations.  The ultrasound suggests that the vein is dilating but that there are some smaller areas, particularly around the antecubital crease and at the arterial anastomosis.  I think at this time we need to proceed with a fistulogram and intervention.  I will probably access near the anastomosis to study the vein.  It looks like an area up near the shoulder that may need to be addressed.  I also may have to address the arterial anastomosis.    Jorge Ny, MD Electronically Signed  VWB/MEDQ  D:  06/15/2010  T:  06/16/2010  Job:  [id number]  cc:   [facility]

## 2010-06-21 ENCOUNTER — Emergency Department (HOSPITAL_COMMUNITY): Payer: No Typology Code available for payment source

## 2010-06-21 ENCOUNTER — Emergency Department (HOSPITAL_COMMUNITY)
Admission: EM | Admit: 2010-06-21 | Discharge: 2010-06-22 | Disposition: A | Discharge status: Home / Self Care | Payer: No Typology Code available for payment source | Attending: Emergency Medicine | Admitting: Emergency Medicine

## 2010-06-21 DIAGNOSIS — Z992 Dependence on renal dialysis: Secondary | ICD-10-CM | POA: Insufficient documentation

## 2010-06-21 DIAGNOSIS — E7800 Pure hypercholesterolemia, unspecified: Secondary | ICD-10-CM | POA: Insufficient documentation

## 2010-06-21 DIAGNOSIS — I120 Hypertensive chronic kidney disease with stage 5 chronic kidney disease or end stage renal disease: Secondary | ICD-10-CM | POA: Insufficient documentation

## 2010-06-21 DIAGNOSIS — R059 Cough, unspecified: Secondary | ICD-10-CM | POA: Insufficient documentation

## 2010-06-21 DIAGNOSIS — R5381 Other malaise: Secondary | ICD-10-CM | POA: Insufficient documentation

## 2010-06-21 DIAGNOSIS — N186 End stage renal disease: Secondary | ICD-10-CM | POA: Insufficient documentation

## 2010-06-21 DIAGNOSIS — R42 Dizziness and giddiness: Secondary | ICD-10-CM | POA: Insufficient documentation

## 2010-06-21 DIAGNOSIS — E785 Hyperlipidemia, unspecified: Secondary | ICD-10-CM | POA: Insufficient documentation

## 2010-06-21 DIAGNOSIS — E860 Dehydration: Secondary | ICD-10-CM | POA: Insufficient documentation

## 2010-06-21 DIAGNOSIS — R05 Cough: Secondary | ICD-10-CM | POA: Insufficient documentation

## 2010-06-21 LAB — DIFFERENTIAL
Basophils Absolute: 0 10*3/uL (ref 0.0–0.1)
Basophils Relative: 0 % (ref 0–1)
Eosinophils Relative: 1 % (ref 0–5)
Monocytes Absolute: 1 10*3/uL (ref 0.1–1.0)

## 2010-06-21 LAB — POCT CARDIAC MARKERS
CKMB, poc: 1.5 ng/mL (ref 1.0–8.0)
Myoglobin, poc: >500 ng/mL (ref 12–200)

## 2010-06-21 LAB — CBC
MCHC: 33.4 g/dL (ref 30.0–36.0)
Platelets: 400 10*3/uL (ref 150–400)
RDW: 14 % (ref 11.5–15.5)
WBC: 10.3 10*3/uL (ref 4.0–10.5)

## 2010-06-21 LAB — BASIC METABOLIC PANEL
Calcium: 9.9 mg/dL (ref 8.4–10.5)
GFR calc Af Amer: 5 mL/min — ABNORMAL LOW (ref >60)
GFR calc non Af Amer: 4 mL/min — ABNORMAL LOW (ref >60)
Potassium: 3.7 mEq/L (ref 3.5–5.1)
Sodium: 139 mEq/L (ref 135–145)

## 2010-06-21 LAB — OCCULT BLOOD, POC DEVICE: Fecal Occult Bld: NEGATIVE

## 2010-06-22 NOTE — Procedures (Unsigned)
VASCULAR LAB EXAM  INDICATION:  Non-maturing right radiocephalic AV fistula.  HISTORY: Previous failed left upper extremity AV fistula.  EXAM:  Right arm AV fistula duplex.  IMPRESSION:  Patent right radiocephalic fistula with elevated velocities in the anastomotic area.  A large branch is observed in the antecubital fossa with velocities beyond the branch decreased with dampened waveforms.  ___________________________________________ V. Charlena Cross, MD  LT/MEDQ  D:  06/15/2010  T:  06/15/2010  Job:  [id number]

## 2010-06-24 ENCOUNTER — Ambulatory Visit (HOSPITAL_COMMUNITY)
Admission: RE | Admit: 2010-06-24 | Discharge: 2010-06-24 | Disposition: A | Discharge status: Home / Self Care | Payer: No Typology Code available for payment source | Source: Ambulatory Visit | Attending: Surgery | Admitting: Surgery

## 2010-06-24 DIAGNOSIS — T82598A Other mechanical complication of other cardiac and vascular devices and implants, initial encounter: Secondary | ICD-10-CM | POA: Insufficient documentation

## 2010-06-24 DIAGNOSIS — Y849 Medical procedure, unspecified as the cause of abnormal reaction of the patient, or of later complication, without mention of misadventure at the time of the procedure: Secondary | ICD-10-CM | POA: Insufficient documentation

## 2010-06-24 LAB — POCT I-STAT, CHEM 8
Calcium, Ion: 1.05 mmol/L — ABNORMAL LOW (ref 1.12–1.32)
Chloride: 103 mEq/L (ref 96–112)
Glucose, Bld: 147 mg/dL — ABNORMAL HIGH (ref 70–99)
HCT: 41 % (ref 36.0–46.0)
Hemoglobin: 13.9 g/dL (ref 12.0–15.0)
TCO2: 23 mmol/L (ref 0–100)

## 2010-07-13 ENCOUNTER — Ambulatory Visit (INDEPENDENT_AMBULATORY_CARE_PROVIDER_SITE_OTHER): Payer: No Typology Code available for payment source | Admitting: Surgery

## 2010-07-13 DIAGNOSIS — T82898A Other specified complication of vascular prosthetic devices, implants and grafts, initial encounter: Secondary | ICD-10-CM

## 2010-07-13 DIAGNOSIS — N186 End stage renal disease: Secondary | ICD-10-CM

## 2010-07-13 NOTE — Assessment & Plan Note (Signed)
OFFICE VISIT  Nicole [name], Nicole [name] DOB:  1932/03/24                                       07/13/2010 ZHYQM#:[id number]  The patient comes back today for followup.  She is status post right radiocephalic fistula placed on 05/08/2010.  She has subsequently undergone a fistulogram which revealed a very small caliber vein which I did not feel was going to mature.  She comes back today to discuss additional access options.  I have reviewed her initial vein mapping. This suggests that she has an adequate caliber right basilic vein measuring at smallest diameter 0.38 in the upper arm.  I have discussed proceeding with a right basilic vein transposition.  We discussed the risks and benefits including the risk of steal, the risk of nonmaturity and the aspects of the operation.  She wishes to proceed.  I did discuss that if I felt her vein was not adequate in the operating room that I would place a forearm graft at that time.  In addition, I will consider ligating her right radiocephalic fistula if I feel it is necessary at that time.  Her procedure has been scheduled for Friday, May 18.    Jorge Ny, MD Electronically Signed  VWB/MEDQ  D:  07/13/2010  T:  07/13/2010  Job:  [id number]  cc:   Mindi Slicker. Lowell Guitar, M.D.

## 2010-07-20 NOTE — Op Note (Signed)
  Nicole [name], Nicole [name]                 ACCOUNT NO.:  [id number]  MEDICAL RECORD NO.:  [id number]           PATIENT TYPE:  O  LOCATION:  [facility]                         FACILITY:  [facility]  PHYSICIAN:  Juleen China IV, MDDATE OF BIRTH:  1932/10/28  DATE OF PROCEDURE:  06/24/2010 DATE OF DISCHARGE:  06/24/2010                              OPERATIVE REPORT   PREOPERATIVE DIAGNOSIS:  Nonmaturing right wrist fistula.  POSTOPERATIVE DIAGNOSIS:  Nonmaturing right wrist fistula.  PROCEDURES PERFORMED: 1. Ultrasound access right arm fistula. 2. Fistulogram.  [name] is a 77 year old female who is status post right radiocephalic fistula.  She comes in today for fistulogram for nonmaturity.  PROCEDURE:  The patient was identified in the holding area, taken to room A, placed supine on the table.  The right arm was prepped and draped in the usual fashion.  A time-out was called.  Ultrasound was used to evaluate the fistula.  It was small in caliber.  Lidocaine 1% was used for local anesthesia.  The fistula was accessed under ultrasound guidance with a micropuncture needle.  An  0.018 wire was then advanced without resistance into the fistula.  A micropuncture sheath was placed.  Contrast injections were performed through the sheath.  FINDINGS:  The cephalic vein in the forearm was extremely small.  The cephalic vein in the upper arm also measures very small in caliber. There was drainage into the brachial veins.  The axillary, subclavian, right innominate, and superior vena cava were patent without stenosis.  IMPRESSION:  Small-caliber fistula not amendable to percutaneous intervention.  The patient most likely will need graft placement for dialysis as her cephalic vein is too small for access.     Jorge Ny, MD     VWB/MEDQ  D:  06/25/2010  T:  06/26/2010  Job:  [id number]  Electronically Signed by Arelia Longest IV MD on 07/20/2010 10:57:44 PM

## 2010-07-21 NOTE — Assessment & Plan Note (Signed)
OFFICE VISIT   Nicole [name], Nicole [name]  DOB:  1932/07/01                                       02/02/2010  CHART#:[id number]   The patient comes back today.  She is status post left radiocephalic  fistula on 01/09/2010.  Today her cephalic vein is patent however it  appears small in caliber.  I am concerned that this may not mature.  I  would like to give it a couple more weeks to see if it makes any further  progress.  I am going to bring her back in 3 weeks.  We will get an  ultrasound to evaluate her fistula to see why it is not dilated any  further.  We may need to consider an upper arm fistula versus going to  her right arm for access.     Jorge Ny, MD  Electronically Signed   VWB/MEDQ  D:  02/02/2010  T:  02/02/2010  Job:  [id number]   cc:   [facility]

## 2010-07-21 NOTE — Procedures (Signed)
CEPHALIC VEIN MAPPING   INDICATION:  Preop AVF placement.   HISTORY:  End-stage renal disease.   EXAM:  The right cephalic vein is compressible.   Diameter measurements range from 0.21 cm to 0.40 cm.   The right basilic vein is compressible.   Diameter measurements range from 0.32 cm to 0.41 cm.   The left cephalic vein is compressible.   Diameter measurements range from 0.25 cm to 0.37 cm.   The left basilic vein is not compressible.   See attached worksheet for all measurements.   IMPRESSION:  1. Patent right and left cephalic veins and patent right basilic vein.  2. There is a possible focal thrombus located in the left upper arm      basilic vein.   ___________________________________________  V. Charlena Cross, MD   EM/MEDQ  D:  12/22/2009  T:  12/22/2009  Job:  [id number]

## 2010-07-21 NOTE — Assessment & Plan Note (Signed)
OFFICE VISIT   Nicole [name], Nicole [name]  DOB:  18-Nov-1932                                       03/23/2010  CHART#:[id number]   [name] comes back today for follow-up of her fistula.  I placed a  left radiocephalic fistula on 01/09/2010.  She previously had undergone  removal of infected graft by Dr. Darrick Penna.  I was concerned about the  maturation of her vein and order a duplex for her visit today.  Duplex  shows that her vein diameters are rather on the small side, ranging from  0.21-0.28 going up the arm.  This is different from her preoperative  vein mapping which showed her left cephalic vein to be over 3.  I think  at this point before we abandon this arm as an access site for a  fistula, I would like to get a fistulogram to evaluate the size and  caliber of the vein.  I have scheduled this done Wednesday February 1  and we will make plans accordingly based on what is found.  She does  appear to have an adequate right cephalic vein beginning at the wrist.  If she is not a candidate for revision on the left, we can consider a  fistula on the right.     Jorge Ny, MD  Electronically Signed   VWB/MEDQ  D:  03/23/2010  T:  03/24/2010  Job:  [id number]   cc:   [facility]

## 2010-07-21 NOTE — Assessment & Plan Note (Signed)
OFFICE VISIT   Nicole [name], Nicole [name]  DOB:  01-21-1933                                       12/22/2009  ZOXWR#:[id number]   The patient most recently had an infected left forearm graft taken out  by Dr. Darrick Penna.  She now needs new access.  Her graft was removed on  09/28.  She comes back in today for new access.  She did have vein  mapping performed today.  Her left cephalic vein is widely patent.  It  measures from 0.34 at the wrist and is consistently above 0.3.  I  discussed with her attempting a fistula in the left wrist.  We discussed  the risk of not maturing and the need for additional procedures.  Despite having had a graft in her forearm I still think the cephalic  vein is usable.  It is actually what appears to be adequate size by  ultrasound.  I am going to give her another 3 weeks to get over her  graft removal operation.  She has been scheduled for left radiocephalic  fistula on Friday 11/04.     Jorge Ny, MD  Electronically Signed   VWB/MEDQ  D:  12/22/2009  T:  12/23/2009  Job:  [id number]

## 2010-07-24 ENCOUNTER — Ambulatory Visit (HOSPITAL_COMMUNITY)
Admission: RE | Admit: 2010-07-24 | Discharge: 2010-07-24 | Disposition: A | Discharge status: Home / Self Care | Payer: No Typology Code available for payment source | Source: Ambulatory Visit | Attending: Surgery | Admitting: Surgery

## 2010-07-24 DIAGNOSIS — N186 End stage renal disease: Secondary | ICD-10-CM | POA: Insufficient documentation

## 2010-07-24 DIAGNOSIS — Y832 Surgical operation with anastomosis, bypass or graft as the cause of abnormal reaction of the patient, or of later complication, without mention of misadventure at the time of the procedure: Secondary | ICD-10-CM | POA: Insufficient documentation

## 2010-07-24 DIAGNOSIS — Z01812 Encounter for preprocedural laboratory examination: Secondary | ICD-10-CM | POA: Insufficient documentation

## 2010-07-24 DIAGNOSIS — I120 Hypertensive chronic kidney disease with stage 5 chronic kidney disease or end stage renal disease: Secondary | ICD-10-CM

## 2010-07-24 DIAGNOSIS — T82598A Other mechanical complication of other cardiac and vascular devices and implants, initial encounter: Secondary | ICD-10-CM | POA: Insufficient documentation

## 2010-07-24 DIAGNOSIS — T82898A Other specified complication of vascular prosthetic devices, implants and grafts, initial encounter: Secondary | ICD-10-CM

## 2010-07-24 HISTORY — PX: ARTERIOVENOUS GRAFT PLACEMENT: SUR1029

## 2010-07-24 LAB — SURGICAL PCR SCREEN: MRSA, PCR: NEGATIVE

## 2010-07-24 LAB — POCT I-STAT 4, (NA,K, GLUC, HGB,HCT): HCT: 35 % — ABNORMAL LOW (ref 36.0–46.0)

## 2010-07-24 NOTE — Assessment & Plan Note (Signed)
HEALTHCARE                              CARDIOLOGY OFFICE NOTE   NAME:GOINSEnola, Siebers                        MRN:          [id number]  DATE:10/19/2005                            DOB:          1932/09/25    Ms. Pacetti is a 77 year old female, with past medical history of  hyperthyroidism, hypertension, hyperlipidemia, depression, osteoarthritis,  peripheral vascular disease, gastroesophageal reflux disease, who I am asked  to evaluate for a murmur and carotid bruits.  The patient has no prior  cardiac history.  She does have an echocardiogram from November 13, 2003  which showed normal LV function without regional wall motion abnormalities  and mild concentric left ventricular hypertrophy.  She typically does not  have dyspnea on exertion, orthopnea, PND, pedal edema, palpitations,  presyncope, syncope or exertional chest pain.  She was recently noted by her  primary care physician to have carotid bruits and a murmur, and we were  asked to further evaluate.  Her medications at present include Zocor 40 mg  p.o. q.h.s., Synthroid 0.075 mg p.o. q.d., Toprol 100 mg p.o. q.d.,  lisinopril HCT 20/12.5-mg tablets 1 p.o. b.i.d. and aspirin 325 mg p.o. q.d.  She has no known drug allergies.   SOCIAL HISTORY:  She does smoke a pack of cigarettes every 3-4 days.  She  does not consume alcohol.   FAMILY HISTORY:  Negative for coronary artery disease.   PAST MEDICAL HISTORY:  Significant for hypertension, hyperlipidemia, but  there is no diabetes mellitus.  She has a history of hyperthyroidism  associated with hypercalcemia.  She also has a history of depression,  osteoarthritis.  She also has gastroesophageal reflux disease.  She has had  peripheral vascular disease and has had prior revascularization surgery  approximately 9 years ago by her report.  She does not recall the details.   REVIEW OF SYSTEMS:  She denies any headaches or fevers or chills.  There  is  no productive cough or hemoptysis.  There is no dysphagia, odynophagia,  melena or hematochezia.  There is no dysuria or hematuria.  There is no rash  or seizure activity.  There is no orthopnea, PND or pedal edema.  The  remainder of systems are negative.   PHYSICAL EXAMINATION:  VITAL SIGNS:  Her physical exam today shows a blood  pressure of 172/80 in the right arm and 179/90 in the left arm.  Her pulse  is 57.  She weighs 151 pounds.  GENERAL:  She is well developed, well nourished in no acute distress.  SKIN:  Warm and dry.  She does not appear to be depressed.  There is no peripheral clubbing.  HEENT:  Unremarkable with normal eyelids.  NECK:  Supple.  There are bilateral carotid bruits, right greater than left.  There is no jugular venous distention, and there is no thyromegaly noted.  CHEST:  Clear to auscultation.  Normal expansion.  CARDIOVASCULAR:  Regular rate and rhythm with a normal S1 and S2.  There is  a 1-2/6 systolic ejection murmur at the left sternal border.  There is  no  diastolic murmur noted.  There is no S3 or S4.  ABDOMEN:  Not tender or distended.  Positive bowel sounds.  No  hepatosplenomegaly.  No masses appreciated.  There is a loud mid abdominal  bruit.  She has 2+ femoral pulse on the right and 1+ on the left.  She has  bilateral femoral bruits.  EXTREMITIES:  Her extremities show no edema, and I can palate no cords.  Her  distal pulses are diminished.  NEUROLOGICAL:  Grossly intact.   Her electrocardiogram shows a sinus bradycardia, rate of 57.  There is no  left ventricular hypertrophy noted.  There are no ST changes noted.   DIAGNOSES:  1. Soft systolic murmur.  2. Bilateral carotid bruits.  3. Bilateral femoral and abdominal bruit.  4. Hypertension.  5. Hyperlipidemia.  6. Tobacco abuse.  7. Peripheral vascular disease status post revascularization surgery.  8. Gastroesophageal reflux disease.  9. Hypothyroidism.   PLAN:  Ms. Vlachos  presents for evaluation of a murmur, and we will schedule  her to have an echocardiogram to more fully assess, although this sounds to  be an ejection murmur.  She also has bilateral carotid Dopplers, right  greater than left, and we will schedule her for carotid Dopplers to more  fully evaluate.  She will also need an abdominal ultrasound given her bruit.  I will add Norvasc 5 mg p.o. q.d. as her blood pressure is elevated today.  She will continue on her Zocor, and her goal LDL should be less than 70  given her history of vascular disease.  I will leave this to Dr. Elisabeth Most.  I discussed the importance of exercise and diet, and I also discussed the  importance of discontinuing her tobacco use.  I will see her back in six  weeks.                              Madolyn Frieze Jens Som, MD, [facility]    BSC/MedQ  DD:  10/19/2005  DT:  10/20/2005  Job #:  [id number]   cc:   Lovenia Kim, DO

## 2010-07-24 NOTE — Assessment & Plan Note (Signed)
[facility]                              CARDIOLOGY OFFICE NOTE   NAME:Nicole [name]                        MRN:          [id number]  DATE:11/29/2005                            DOB:          04-12-1932    Nicole [name] is a 77 year old female whom I recently saw on October 19, 2005  secondary to murmur and carotid bruits.  Please refer to my note for  details.  At that time we ordered carotid Dopplers, echocardiogram, and  abdominal ultrasound.  However, she did not have these studies performed  stating that she forgot her appointment.  We did add Norvasc for her blood  pressure.  Since then, she denies any dyspnea, chest pain, palpitations, or  syncope, and there is no pedal edema.   MEDICATIONS AT PRESENT:  1. Zocor 40 mg p.o. nightly.  2. Synthroid 0.075 mg p.o. q. day.  3. Toprol 100 mg p.o. q. day.  4. Lisinopril/Hydrochlorothiazide 20/12.5 mg tablets 1 p.o. b.i.d.  5. Aspirin 325 mg p.o. q. day.  6. Norvasc 5 mg p.o. q. day.   PHYSICAL EXAM:  Blood pressure of 148/62 and her pulse is 60.  NECK:  Supple.  There are bilateral bruits, right greater than left.  CHEST:  Clear.  CARDIOVASCULAR:  Regular rate and rhythm.  There is a 1/6 to 2/6 systolic  ejection murmur over the left sternal border.  ABDOMEN:  Significant for a mid abdominal bruit.  EXTREMITIES:  No edema.   DIAGNOSES:  1. Soft systolic murmur.  2. Bilateral carotid bruits.  3. Bilateral femoral and abdominal bruit.  4. Hypertension.  5. Hyperlipidemia.  6. Tobacco abuse.  7. Peripheral vascular disease status post revascularization surgery.  8. Gastroesophageal reflux disease.  9. Hypothyroidism.   PLAN:  [name] blood pressure is somewhat Improved although her systolic  remains above 140.  Given her history of vascular disease we will increase  her Norvasc to 10 mg p.o. q. day with a goal systolic pressure of less than  130.  She did not have a previous study performed  and we will reschedule her  echocardiogram to assess her murmur, her carotid Dopplers, and her abdominal  ultrasound.  We discussed the importance of discontinuing tobacco use.  Dr.  Elisabeth Most is following her lipids and her liver.  Our goal LDL should be  less than 70 given her history of vascular disease.  We will see her back in  8 weeks to review her blood pressure.                              Madolyn Frieze Jens Som, MD, [facility]    BSC/MedQ  DD:  11/29/2005  DT:  12/01/2005  Job #:  [id number]   cc:   Lovenia Kim, D.O.

## 2010-07-28 NOTE — Op Note (Signed)
Nicole [name], Nicole [name]                 ACCOUNT NO.:  [id number]  MEDICAL RECORD NO.:  [id number]           PATIENT TYPE:  O  LOCATION:  [facility]                         FACILITY:  [facility]  PHYSICIAN:  Juleen China IV, MDDATE OF BIRTH:  1933/01/04  DATE OF PROCEDURE:  07/24/2010 DATE OF DISCHARGE:  07/24/2010                              OPERATIVE REPORT   PREOPERATIVE DIAGNOSIS:  End-stage renal disease.  POSTOPERATIVE DIAGNOSIS:  End-stage renal disease.  PROCEDURE PERFORMED: 1. Ligation of right arm AV fistula. 2. Right forearm AV graft.  ANESTHESIA:  General.  SURGEON: 1. Charlena Cross, MD  ASSISTANT:  Newton Pigg, PA  FINDINGS:  Basilic vein was deemed to be too small, to be used for transposition and therefore, right forearm graft was placed.  This was a 6-mm graft with end-to-side anastomosis to the basilic vein.  INDICATIONS:  This is a 77 year old female who has previously undergone a right radiocephalic fistula which has not matured.  Ultrasound, vein mapping deemed her to be a candidate for basilic vein transposition.  I discussed proceeding with basilic vein transposition and then for graft and if the vein was not adequate, placing a right forearm graft at this time.  PROCEDURE:  The patient was identified in the holding are and taken to room 8, placed supine on the table.  General anesthesia was administered.  The patient was prepped and draped in usual fashion. Time-out was called.  Antibiotics were given.  Ultrasound was used to map the course of the basilic vein in the upper arm.  A transverse incision was made medially over the basilic vein.  Sharp dissection was used to expose the basilic vein.  It appeared to be very small in the arm, I dissected it out further proximally and about the mid arm, it dilated to a nice sized vein.  However, I did not feel that there was enough adequate vein to proceed with this vein transposition, and therefore I  elected to place a graft.  I made a medial incision at the antecubital crease and dissected down sharply to expose the brachial artery.  This was a rather small artery approximately 2.5 mm.  Once the artery was fully mobilized, I created a counter incision slightly off the midline so that I could ligate her previous radiocephalic fistula. Through this counter incision, I identified the radiocephalic fistula and ligated with two 2-0 silk ties.  I then used a straight tunneler to create a horseshoe-shaped path for the graft.  A 6-mm graft was brought onto the field and brought through the tunnel.  At this point, the patient was heparinized.  After the heparin was circulated, the brachial artery was occluded with serrefine clamps and an 11 blade was used to make an arteriotomy, which was extended longitudinally with Potts scissors.  An end-to-side anastomosis was then created with running 6-0 Prolene.  The artery was allowed to be flushed in antegrade and retrograde fashion.  The anastomosis was completed.  There was excellent pulsatile flow through the graft.  The graft was then flushed with heparin saline and re-occluded.  The venous anastomosis was  first to the brachial and at most to the basilic vein where it became dilated in the proximal portion of the incision that had previously been created.  The vein was occluded with vascular clamps and #11 blade was used to make a venotomy which was extended longitudinally with Potts scissors.  The graft was cut at the appropriate length and then beveled to fit the size of the venotomy.  An end-to-side anastomosis was created with running 6- 0 Prolene.  Prior to completion, appropriate flush maneuvers were performed and the anastomosis was completed.  There was an excellent thrill within the graft.  The patient's heparin was then reversed with 50 mg of protamine.  The counter incision was closed with running 3-0 Vicryl.  The transverse incision  was closed with 2 layers of 3-0 Vicryl and the longitudinal incision in the upper arm was closed with 2 layers of 3-0 Vicryl.  The patient tolerated the procedure well.  Dermabond was placed in the wound.  The patient was successfully extubated, taken to recovery room in stable condition.     Jorge Ny, MD     VWB/MEDQ  D:  07/26/2010  T:  07/27/2010  Job:  [id number]  Electronically Signed by Arelia Longest IV MD on 07/28/2010 10:11:21 PM

## 2010-09-07 ENCOUNTER — Ambulatory Visit (HOSPITAL_COMMUNITY): Payer: No Typology Code available for payment source | Attending: Vascular Surgery

## 2010-09-07 DIAGNOSIS — N186 End stage renal disease: Secondary | ICD-10-CM | POA: Insufficient documentation

## 2010-09-07 DIAGNOSIS — Z452 Encounter for adjustment and management of vascular access device: Secondary | ICD-10-CM | POA: Insufficient documentation

## 2010-12-05 ENCOUNTER — Other Ambulatory Visit (HOSPITAL_COMMUNITY): Payer: Self-pay | Admitting: Nephrology

## 2010-12-05 ENCOUNTER — Ambulatory Visit (HOSPITAL_COMMUNITY)
Admission: RE | Admit: 2010-12-05 | Discharge: 2010-12-05 | Disposition: A | Discharge status: Home / Self Care | Payer: No Typology Code available for payment source | Source: Ambulatory Visit | Attending: Nephrology | Admitting: Nephrology

## 2010-12-05 DIAGNOSIS — T82898A Other specified complication of vascular prosthetic devices, implants and grafts, initial encounter: Secondary | ICD-10-CM | POA: Insufficient documentation

## 2010-12-05 DIAGNOSIS — N186 End stage renal disease: Secondary | ICD-10-CM

## 2010-12-05 DIAGNOSIS — I120 Hypertensive chronic kidney disease with stage 5 chronic kidney disease or end stage renal disease: Secondary | ICD-10-CM | POA: Insufficient documentation

## 2010-12-05 DIAGNOSIS — Y849 Medical procedure, unspecified as the cause of abnormal reaction of the patient, or of later complication, without mention of misadventure at the time of the procedure: Secondary | ICD-10-CM | POA: Insufficient documentation

## 2010-12-05 LAB — POCT I-STAT 4, (NA,K, GLUC, HGB,HCT)
HCT: 37 % (ref 36.0–46.0)
Hemoglobin: 12.6 g/dL (ref 12.0–15.0)

## 2010-12-05 MED ORDER — IOHEXOL 300 MG/ML  SOLN
100.0000 mL | Freq: Once | INTRAMUSCULAR | Status: AC | PRN
  Administered 2010-12-05: 25 mL via INTRAVENOUS

## 2011-06-11 ENCOUNTER — Encounter (HOSPITAL_COMMUNITY): Payer: Self-pay | Admitting: Emergency Medicine

## 2011-06-11 ENCOUNTER — Emergency Department (HOSPITAL_COMMUNITY): Payer: No Typology Code available for payment source

## 2011-06-11 ENCOUNTER — Inpatient Hospital Stay (HOSPITAL_COMMUNITY)
Admission: EM | Admit: 2011-06-11 | Discharge: 2011-06-16 | DRG: 682 | Disposition: A | Discharge status: Home / Self Care | Payer: No Typology Code available for payment source | Attending: Nephrology | Admitting: Nephrology

## 2011-06-11 ENCOUNTER — Other Ambulatory Visit: Payer: Self-pay

## 2011-06-11 DIAGNOSIS — N186 End stage renal disease: Secondary | ICD-10-CM | POA: Diagnosis present

## 2011-06-11 DIAGNOSIS — I739 Peripheral vascular disease, unspecified: Secondary | ICD-10-CM | POA: Insufficient documentation

## 2011-06-11 DIAGNOSIS — R0902 Hypoxemia: Secondary | ICD-10-CM | POA: Diagnosis present

## 2011-06-11 DIAGNOSIS — N2581 Secondary hyperparathyroidism of renal origin: Secondary | ICD-10-CM | POA: Diagnosis present

## 2011-06-11 DIAGNOSIS — D631 Anemia in chronic kidney disease: Secondary | ICD-10-CM

## 2011-06-11 DIAGNOSIS — E039 Hypothyroidism, unspecified: Secondary | ICD-10-CM | POA: Insufficient documentation

## 2011-06-11 DIAGNOSIS — Z79899 Other long term (current) drug therapy: Secondary | ICD-10-CM

## 2011-06-11 DIAGNOSIS — F17200 Nicotine dependence, unspecified, uncomplicated: Secondary | ICD-10-CM | POA: Diagnosis present

## 2011-06-11 DIAGNOSIS — J811 Chronic pulmonary edema: Secondary | ICD-10-CM

## 2011-06-11 DIAGNOSIS — Z7982 Long term (current) use of aspirin: Secondary | ICD-10-CM

## 2011-06-11 DIAGNOSIS — I120 Hypertensive chronic kidney disease with stage 5 chronic kidney disease or end stage renal disease: Principal | ICD-10-CM | POA: Diagnosis present

## 2011-06-11 DIAGNOSIS — Z992 Dependence on renal dialysis: Secondary | ICD-10-CM

## 2011-06-11 DIAGNOSIS — I10 Essential (primary) hypertension: Secondary | ICD-10-CM | POA: Insufficient documentation

## 2011-06-11 HISTORY — DX: Essential (primary) hypertension: I10

## 2011-06-11 LAB — DIFFERENTIAL
Basophils Absolute: 0 10*3/uL (ref 0.0–0.1)
Basophils Relative: 0 % (ref 0–1)
Eosinophils Relative: 3 % (ref 0–5)
Monocytes Absolute: 0.8 10*3/uL (ref 0.1–1.0)

## 2011-06-11 LAB — CBC
HCT: 26 % — ABNORMAL LOW (ref 36.0–46.0)
MCHC: 32.3 g/dL (ref 30.0–36.0)
MCV: 106.1 fL — ABNORMAL HIGH (ref 78.0–100.0)
RDW: 17.2 % — ABNORMAL HIGH (ref 11.5–15.5)

## 2011-06-11 LAB — COMPREHENSIVE METABOLIC PANEL
AST: 13 U/L (ref 0–37)
CO2: 22 mEq/L (ref 19–32)
Calcium: 9.1 mg/dL (ref 8.4–10.5)
Creatinine, Ser: 11.06 mg/dL — ABNORMAL HIGH (ref 0.50–1.10)
GFR calc non Af Amer: 3 mL/min — ABNORMAL LOW (ref >90)

## 2011-06-11 LAB — URINALYSIS, ROUTINE W REFLEX MICROSCOPIC
Bilirubin Urine: NEGATIVE
Ketones, ur: NEGATIVE mg/dL
Nitrite: NEGATIVE
Protein, ur: 100 mg/dL — AB
Specific Gravity, Urine: 1.012 (ref 1.005–1.030)
Urobilinogen, UA: 0.2 mg/dL (ref 0.0–1.0)

## 2011-06-11 LAB — CK TOTAL AND CKMB (NOT AT ARMC)
CK, MB: 3.2 ng/mL (ref 0.3–4.0)
Relative Index: INVALID (ref 0.0–2.5)

## 2011-06-11 LAB — POCT I-STAT TROPONIN I

## 2011-06-11 LAB — URINE MICROSCOPIC-ADD ON

## 2011-06-11 NOTE — ED Provider Notes (Signed)
History     CSN: [id number]  Arrival date & time 06/11/11  1907   First MD Initiated Contact with Patient 06/11/11 2022      Chief Complaint  Patient presents with  . Fatigue    (Consider location/radiation/quality/duration/timing/severity/associated sxs/prior treatment) Patient is a 78 y.o. female presenting with weakness. The history is provided by the patient.  Weakness Primary symptoms do not include headaches, loss of consciousness, dizziness, focal weakness, fever, nausea or vomiting. The symptoms began 2 days ago. The symptoms are worsening. The neurological symptoms are diffuse. Context: all the time.  Additional symptoms include weakness. Additional symptoms do not include neck stiffness, pain or photophobia.    Past Medical History  Diagnosis Date  . Hypertension     No past surgical history on file.  No family history on file.  History  Substance Use Topics  . Smoking status: Current Everyday Smoker  . Smokeless tobacco: Not on file  . Alcohol Use:     OB History    Grav Para Term Preterm Abortions TAB SAB Ect Mult Living                  Review of Systems  Constitutional: Negative for fever and chills.  HENT: Negative for congestion, rhinorrhea and neck stiffness.   Eyes: Negative for photophobia.  Respiratory: Positive for cough. Negative for shortness of breath.   Cardiovascular: Negative for chest pain and leg swelling.  Gastrointestinal: Negative for nausea, vomiting, abdominal pain and constipation.  Genitourinary: Negative for urgency, decreased urine volume and difficulty urinating.  Skin: Negative for wound.  Neurological: Positive for weakness and light-headedness. Negative for dizziness, focal weakness, loss of consciousness and headaches.  Psychiatric/Behavioral: Negative for confusion.  All other systems reviewed and are negative.    Allergies  Review of patient's allergies indicates no known allergies.  Home Medications   Current  Outpatient Rx  Name Route Sig Dispense Refill  . AMLODIPINE BESYLATE 10 MG PO TABS Oral Take 10 mg by mouth at bedtime.    Marland Kitchen LISINOPRIL 40 MG PO TABS Oral Take 40 mg by mouth at bedtime.      BP 172/53  Pulse 63  Temp(Src) 98.6 F (37 C) (Oral)  Resp 18  SpO2 88%  Physical Exam  Constitutional: She is oriented to person, place, and time. She appears well-developed and well-nourished. No distress.  HENT:  Head: Normocephalic and atraumatic.  Right Ear: External ear normal.  Left Ear: External ear normal.  Nose: Nose normal.  Mouth/Throat: Oropharynx is clear and moist.  Neck: Neck supple.  Cardiovascular: Normal rate, regular rhythm, normal heart sounds and intact distal pulses.   Pulmonary/Chest: Effort normal. No respiratory distress. She has no wheezes. She has rales in the right lower field and the left lower field.  Abdominal: Soft. She exhibits no distension. There is no tenderness.  Musculoskeletal: She exhibits no edema.  Lymphadenopathy:    She has no cervical adenopathy.  Neurological: She is alert and oriented to person, place, and time.  Skin: Skin is warm and dry. She is not diaphoretic. No pallor.    ED Course  Procedures (including critical care time)  Labs Reviewed  CBC - Abnormal; Notable for the following:    WBC 10.8 (*)    RBC 2.45 (*)    Hemoglobin 8.4 (*)    HCT 26.0 (*)    MCV 106.1 (*)    MCH 34.3 (*)    RDW 17.2 (*)    All other  components within normal limits  COMPREHENSIVE METABOLIC PANEL - Abnormal; Notable for the following:    Glucose, Bld 105 (*)    BUN 64 (*)    Creatinine, Ser 11.06 (*)    Albumin 3.0 (*)    GFR calc non Af Amer 3 (*)    GFR calc Af Amer 3 (*)    All other components within normal limits  URINALYSIS, ROUTINE W REFLEX MICROSCOPIC - Abnormal; Notable for the following:    APPearance CLOUDY (*)    Hgb urine dipstick TRACE (*)    Protein, ur 100 (*)    Leukocytes, UA SMALL (*)    All other components within normal  limits  POCT I-STAT TROPONIN I - Abnormal; Notable for the following:    Troponin i, poc 0.12 (*)    All other components within normal limits  DIFFERENTIAL  CK TOTAL AND CKMB  URINE MICROSCOPIC-ADD ON   Dg Chest 2 View  06/11/2011  *RADIOLOGY REPORT*  Clinical Data: Weakness, cough, smoking history  CHEST - 2 VIEW  Comparison: Chest x-ray of 06/21/2010  Findings: There are prominent interstitial markings consistent with interstitial edema with probable tiny effusions.  Mild cardiomegaly is present.  No acute bony abnormality is seen.  IMPRESSION:   Interstitial edema.  Mild cardiomegaly.  Original Report Authenticated By: Juline Patch, M.D.     Date: 06/12/2011  Rate: 66  Rhythm: normal sinus rhythm  QRS Axis: normal  Intervals: normal  ST/T Wave abnormalities: normal  Conduction Disutrbances:none  Narrative Interpretation:   Old EKG Reviewed: unchanged     1. ESRD (end stage renal disease) on dialysis   2. Pulmonary edema       MDM  78 yo female with weakness, lightheadedness x 3 days. Missed her dialysis yesterday. HR, BP ok, but new hypoxia noted, which improved with nasal cannula O2. Edema seen on CXR. EKG unchanged from prior. No hyperkalemia. Renal contacted for admission secondary to symptomatic fluid overload.         Pricilla Loveless, MD 06/12/11 [phone]

## 2011-06-11 NOTE — H&P (Signed)
  Nicole [name] is an 78 y.o. female presents to ER C/O weakness x 3days Chief Complaint: Weakness x 3days HPI: Overall poor historian.  ESRD presumeably sec to HTN has just felt weak for 3 days.  Dialyses TTS at [facility] and had last HD Tuesday, missed HD yest.  No SOB, No CP, No cough.  No fever/chills.  Past Medical History  Diagnosis Date  . Hypertension     Past Surgical History  Procedure Date  . Abdominal surgery     History reviewed. No pertinent family history. Social History:  reports that she has been smoking.  She does not have any smokeless tobacco history on file. She reports that she does not use illicit drugs. Her alcohol history not on file.  Allergies: No Known Allergies  No current facility-administered medications on file as of 06/11/2011.   Medications Prior to Admission  Medication Sig Dispense Refill  . amLODipine (NORVASC) 10 MG tablet Take 10 mg by mouth at bedtime.      Marland Kitchen lisinopril (PRINIVIL,ZESTRIL) 40 MG tablet Take 40 mg by mouth at bedtime.         Lab Results: UA: benign  Basename 06/11/11 2054  WBC 10.8*  HGB 8.4*  HCT 26.0*  PLT 282   BMET  Basename 06/11/11 2054  NA 143  K 4.6  CL 104  CO2 22  GLUCOSE 105*  BUN 64*  CREATININE 11.06*  CALCIUM 9.1  PHOS --   LFT  Basename 06/11/11 2054  PROT 6.5  ALBUMIN 3.0*  AST 13  ALT 7  ALKPHOS 59  BILITOT 0.3  BILIDIR --  IBILI --   Dg Chest 2 View  06/11/2011  *RADIOLOGY REPORT*  Clinical Data: Weakness, cough, smoking history  CHEST - 2 VIEW  Comparison: Chest x-ray of 06/21/2010  Findings: There are prominent interstitial markings consistent with interstitial edema with probable tiny effusions.  Mild cardiomegaly is present.  No acute bony abnormality is seen.  IMPRESSION:   Interstitial edema.  Mild cardiomegaly.  Original Report Authenticated By: Juline Patch, M.D.    ROS: No change vision No change in bowels No abd pain No arthritic co No neuropathic co  PHYSICAL  EXAM: Blood pressure 196/67, pulse 67, temperature 97.8 F (36.6 C), temperature source Rectal, resp. rate 25, SpO2 87.00%. HEENT: PERRLA EOMI NECK:+ JVD LUNGS:few faint bibasilar crackles CARDIAC:RRR with3/6 systolic murmur at LSB and apex ABD:+ BS NTND no HSM EXT:No CCE, Rt AVG + bruit NEURO:CNI M&SI No asterixis  Assessment: 1. Pulm edema 2. HTN 3. Anemia 4. Heart murmur 5. ESRD PLAN: 1. HD tonight 2. Will get info from her Dx unit in AM 3. Not sure if murmur new, if so check echo 4. Will need lower DW 5. Will need better control of BP 6. Check BC at HD   Nicole [name] T 06/11/2011, 11:57 PM

## 2011-06-11 NOTE — ED Notes (Signed)
Pt is aware that we need to collect urine specimen. Pt was using bedside commode and had bowel movement  While trying to give urine.

## 2011-06-11 NOTE — ED Notes (Signed)
The pt is alert she says she has not been feeling well for the past few days.  She has no pain  And she is not on 02 at home.   Requested a urine specimen pt does not need to go.

## 2011-06-11 NOTE — ED Notes (Signed)
Pt does not wear home O2.  Acuity increased at triage due to vitals.

## 2011-06-11 NOTE — ED Notes (Signed)
C/o generalized weakness x 3 days.  Denies pain.  States, "I just feel so bad."

## 2011-06-12 ENCOUNTER — Other Ambulatory Visit: Payer: Self-pay | Admitting: Nephrology

## 2011-06-12 ENCOUNTER — Other Ambulatory Visit: Payer: Self-pay

## 2011-06-12 ENCOUNTER — Emergency Department (HOSPITAL_COMMUNITY): Payer: No Typology Code available for payment source

## 2011-06-12 DIAGNOSIS — E039 Hypothyroidism, unspecified: Secondary | ICD-10-CM | POA: Insufficient documentation

## 2011-06-12 LAB — CBC
MCH: 34.9 pg — ABNORMAL HIGH (ref 26.0–34.0)
MCHC: 32.8 g/dL (ref 30.0–36.0)
MCV: 106.7 fL — ABNORMAL HIGH (ref 78.0–100.0)
Platelets: 278 10*3/uL (ref 150–400)
RBC: 2.69 MIL/uL — ABNORMAL LOW (ref 3.87–5.11)

## 2011-06-12 LAB — TSH: TSH: 17.418 u[IU]/mL — ABNORMAL HIGH (ref 0.350–4.500)

## 2011-06-12 LAB — CARDIAC PANEL(CRET KIN+CKTOT+MB+TROPI)
CK, MB: 3.5 ng/mL (ref 0.3–4.0)
Relative Index: INVALID (ref 0.0–2.5)
Relative Index: INVALID (ref 0.0–2.5)
Total CK: 62 U/L (ref 7–177)
Troponin I: 0.83 ng/mL (ref <0.30)

## 2011-06-12 MED ORDER — ONDANSETRON HCL 4 MG PO TABS: 4.0000 mg | ORAL_TABLET | Freq: Four times a day (QID) | ORAL | Status: DC | PRN

## 2011-06-12 MED ORDER — SORBITOL 70 % SOLN: 30.0000 mL | Status: DC | PRN

## 2011-06-12 MED ORDER — SODIUM CHLORIDE 0.9 % IJ SOLN
3.0000 mL | Freq: Two times a day (BID) | INTRAMUSCULAR | Status: DC
  Administered 2011-06-14 – 2011-06-16 (×3): 3 mL via INTRAVENOUS

## 2011-06-12 MED ORDER — SIMVASTATIN 80 MG PO TABS: 80.0000 mg | ORAL_TABLET | Freq: Every day | ORAL | Status: DC

## 2011-06-12 MED ORDER — LEVOTHYROXINE SODIUM 100 MCG PO TABS
100.0000 ug | ORAL_TABLET | Freq: Every day | ORAL | Status: DC
  Administered 2011-06-12 – 2011-06-16 (×5): 100 ug via ORAL
  Filled 2011-06-12 (×6): qty 1

## 2011-06-12 MED ORDER — DARBEPOETIN ALFA-POLYSORBATE 60 MCG/0.3ML IJ SOLN
60.0000 ug | INTRAMUSCULAR | Status: DC
  Filled 2011-06-12: qty 0.3

## 2011-06-12 MED ORDER — ACETAMINOPHEN 325 MG PO TABS
650.0000 mg | ORAL_TABLET | Freq: Four times a day (QID) | ORAL | Status: DC | PRN
  Administered 2011-06-16: 650 mg via ORAL
  Filled 2011-06-12: qty 2

## 2011-06-12 MED ORDER — ATORVASTATIN CALCIUM 40 MG PO TABS
40.0000 mg | ORAL_TABLET | Freq: Every day | ORAL | Status: DC
  Administered 2011-06-12 – 2011-06-16 (×5): 40 mg via ORAL
  Filled 2011-06-12 (×5): qty 1

## 2011-06-12 MED ORDER — AMLODIPINE BESYLATE 10 MG PO TABS
10.0000 mg | ORAL_TABLET | Freq: Every day | ORAL | Status: DC
  Administered 2011-06-12 – 2011-06-16 (×5): 10 mg via ORAL
  Filled 2011-06-12 (×5): qty 1

## 2011-06-12 MED ORDER — ADULT MULTIVITAMIN W/MINERALS CH: 1.0000 | ORAL_TABLET | Freq: Every day | ORAL | Status: DC

## 2011-06-12 MED ORDER — SODIUM CHLORIDE 0.9 % IJ SOLN
3.0000 mL | Freq: Two times a day (BID) | INTRAMUSCULAR | Status: DC
  Administered 2011-06-12 – 2011-06-16 (×7): 3 mL via INTRAVENOUS

## 2011-06-12 MED ORDER — ONDANSETRON HCL 4 MG/2ML IJ SOLN: 4.0000 mg | Freq: Four times a day (QID) | INTRAMUSCULAR | Status: DC | PRN

## 2011-06-12 MED ORDER — PARICALCITOL 5 MCG/ML IV SOLN
5.0000 ug | INTRAVENOUS | Status: DC
  Administered 2011-06-15: 5 ug via INTRAVENOUS
  Filled 2011-06-12 (×2): qty 1

## 2011-06-12 MED ORDER — NEPRO/CARBSTEADY PO LIQD: 237.0000 mL | Freq: Three times a day (TID) | ORAL | Status: DC | PRN

## 2011-06-12 MED ORDER — DOCUSATE SODIUM 283 MG RE ENEM: 1.0000 | ENEMA | RECTAL | Status: DC | PRN

## 2011-06-12 MED ORDER — ACETAMINOPHEN 650 MG RE SUPP: 650.0000 mg | Freq: Four times a day (QID) | RECTAL | Status: DC | PRN

## 2011-06-12 MED ORDER — SODIUM CHLORIDE 0.9 % IV SOLN: 250.0000 mL | INTRAVENOUS | Status: DC | PRN

## 2011-06-12 MED ORDER — RENA-VITE PO TABS
1.0000 | ORAL_TABLET | Freq: Every day | ORAL | Status: DC
  Administered 2011-06-12 – 2011-06-15 (×4): 1 via ORAL
  Filled 2011-06-12 (×5): qty 1

## 2011-06-12 MED ORDER — SODIUM CHLORIDE 0.9 % IJ SOLN: 3.0000 mL | INTRAMUSCULAR | Status: DC | PRN

## 2011-06-12 MED ORDER — ZOLPIDEM TARTRATE 5 MG PO TABS: 5.0000 mg | ORAL_TABLET | Freq: Every evening | ORAL | Status: DC | PRN

## 2011-06-12 MED ORDER — AMLODIPINE 1 MG/ML ORAL SUSPENSION: 10.0000 mg | Freq: Every day | ORAL | Status: DC

## 2011-06-12 MED ORDER — HYDROXYZINE HCL 25 MG PO TABS: 25.0000 mg | ORAL_TABLET | Freq: Three times a day (TID) | ORAL | Status: DC | PRN

## 2011-06-12 MED ORDER — CALCIUM CARBONATE 1250 MG/5ML PO SUSP: 500.0000 mg | Freq: Four times a day (QID) | ORAL | Status: DC | PRN

## 2011-06-12 MED ORDER — LISINOPRIL 40 MG PO TABS
40.0000 mg | ORAL_TABLET | Freq: Every day | ORAL | Status: DC
  Administered 2011-06-12 – 2011-06-16 (×5): 40 mg via ORAL
  Filled 2011-06-12 (×5): qty 1

## 2011-06-12 MED ORDER — CLONIDINE HCL 0.1 MG PO TABS
0.1000 mg | ORAL_TABLET | ORAL | Status: DC | PRN
  Administered 2011-06-13 – 2011-06-14 (×3): 0.1 mg via ORAL
  Filled 2011-06-12 (×4): qty 1

## 2011-06-12 MED ORDER — CALCIUM ACETATE 667 MG PO CAPS
1334.0000 mg | ORAL_CAPSULE | Freq: Three times a day (TID) | ORAL | Status: DC
  Administered 2011-06-12 – 2011-06-16 (×14): 1334 mg via ORAL
  Filled 2011-06-12 (×16): qty 2

## 2011-06-12 MED ORDER — PENTAFLUOROPROP-TETRAFLUOROETH EX AERO
INHALATION_SPRAY | CUTANEOUS | Status: AC
  Filled 2011-06-12: qty 103.5

## 2011-06-12 MED ORDER — CAMPHOR-MENTHOL 0.5-0.5 % EX LOTN: 1.0000 "application " | TOPICAL_LOTION | Freq: Three times a day (TID) | CUTANEOUS | Status: DC | PRN

## 2011-06-12 MED ORDER — ASPIRIN EC 81 MG PO TBEC
81.0000 mg | DELAYED_RELEASE_TABLET | Freq: Every day | ORAL | Status: DC
  Administered 2011-06-12 – 2011-06-16 (×5): 81 mg via ORAL
  Filled 2011-06-12 (×5): qty 1

## 2011-06-12 NOTE — Progress Notes (Signed)
S:Pt seen on HD.  Feeling better O:There were no vitals taken for this visit. @IOBRIEF @ @WEIGHTCHANGE @ ZOX:WRUEA and alert CVS:RRR 3/6 systolic M Resp:clear Abd:+BS NTND Ext:no edema NEURO:CNI ox3  @LAB24 @    Dg Chest 2 View  06/11/2011  *RADIOLOGY REPORT*  Clinical Data: Weakness, cough, smoking history  CHEST - 2 VIEW  Comparison: Chest x-ray of 06/21/2010  Findings: There are prominent interstitial markings consistent with interstitial edema with probable tiny effusions.  Mild cardiomegaly is present.  No acute bony abnormality is seen.  IMPRESSION:   Interstitial edema.  Mild cardiomegaly.  Original Report Authenticated By: Juline Patch, M.D.   BMET    Component Value Date/Time   NA 143 06/11/2011 2054   K 4.6 06/11/2011 2054   CL 104 06/11/2011 2054   CO2 22 06/11/2011 2054   GLUCOSE 105* 06/11/2011 2054   BUN 64* 06/11/2011 2054   CREATININE 11.06* 06/11/2011 2054   CALCIUM 9.1 06/11/2011 2054   CALCIUM 7.4* 04/19/2009 1427   GFRNONAA 3* 06/11/2011 2054   GFRAA 3* 06/11/2011 2054   CBC    Component Value Date/Time   WBC 10.8* 06/11/2011 2054   RBC 2.45* 06/11/2011 2054   HGB 8.4* 06/11/2011 2054   HCT 26.0* 06/11/2011 2054   PLT 282 06/11/2011 2054   MCV 106.1* 06/11/2011 2054   MCH 34.3* 06/11/2011 2054   MCHC 32.3 06/11/2011 2054   RDW 17.2* 06/11/2011 2054   LYMPHSABS 1.9 06/11/2011 2054   MONOABS 0.8 06/11/2011 2054   EOSABS 0.4 06/11/2011 2054   BASOSABS 0.0 06/11/2011 2054     Assessment 1. Pulm edema, symptomatically better 2. Htn, improved 3. Hypothyroid 4. Anemia 5. ESRD 6. Sec HPTH  Plan: 1. hemocult stools 2. Recheck HG 3. Check tsh 4 resume synthroid 5. Resume epo 6 resume zemplar   Nicole [name] T

## 2011-06-12 NOTE — ED Provider Notes (Signed)
I saw and evaluated the patient, reviewed the resident's note and I agree with the findings and plan.   .Face to face Exam:  General:  Awake HEENT:  Atraumatic Resp:  Normal effort Abd:  Nondistended Neuro:No focal weakness Lymph: No adenopathy   Maurilio Puryear L Decklyn Hyder, MD 06/12/11 0206 

## 2011-06-13 LAB — GLUCOSE, CAPILLARY

## 2011-06-13 MED ORDER — BENZONATATE 100 MG PO CAPS
100.0000 mg | ORAL_CAPSULE | Freq: Three times a day (TID) | ORAL | Status: DC | PRN
  Administered 2011-06-13 – 2011-06-16 (×5): 100 mg via ORAL
  Filled 2011-06-13 (×5): qty 1

## 2011-06-13 NOTE — Progress Notes (Signed)
S: feels much better. Admits to not taking meds O:BP 157/66  Pulse 55  Temp(Src) 98 F (36.7 C) (Oral)  Resp 16  Ht 5\' 2"  (1.575 m)  Wt 61.009 kg (134 lb 8 oz)  BMI 24.60 kg/m2  SpO2 98% @IOBRIEF @ @WEIGHTCHANGE @ ZOX:WRUEA and alert CVS:RRR 3/6 systolic M Resp:clear Abd:+BS NTND Ext:no edema NEURO:CNI ox3  @LAB24 @    . amLODipine  10 mg Oral Daily  . aspirin EC  81 mg Oral Daily  . atorvastatin  40 mg Oral q1800  . calcium acetate  1,334 mg Oral TID WC  . darbepoetin (ARANESP) injection - DIALYSIS  60 mcg Intravenous Q Sat-HD  . levothyroxine  100 mcg Oral QAC breakfast  . lisinopril  40 mg Oral Daily  . multivitamin  1 tablet Oral QHS  . paricalcitol  5 mcg Intravenous Q T,Th,Sa-HD  . pentafluoroprop-tetrafluoroeth      . sodium chloride  3 mL Intravenous Q12H  . sodium chloride  3 mL Intravenous Q12H   Dg Chest 2 View  06/11/2011  *RADIOLOGY REPORT*  Clinical Data: Weakness, cough, smoking history  CHEST - 2 VIEW  Comparison: Chest x-ray of 06/21/2010  Findings: There are prominent interstitial markings consistent with interstitial edema with probable tiny effusions.  Mild cardiomegaly is present.  No acute bony abnormality is seen.  IMPRESSION:   Interstitial edema.  Mild cardiomegaly.  Original Report Authenticated By: Juline Patch, M.D.   BMET    Component Value Date/Time   NA 143 06/11/2011 2054   K 4.6 06/11/2011 2054   CL 104 06/11/2011 2054   CO2 22 06/11/2011 2054   GLUCOSE 105* 06/11/2011 2054   BUN 64* 06/11/2011 2054   CREATININE 11.06* 06/11/2011 2054   CALCIUM 9.1 06/11/2011 2054   CALCIUM 7.4* 04/19/2009 1427   GFRNONAA 3* 06/11/2011 2054   GFRAA 3* 06/11/2011 2054   CBC    Component Value Date/Time   WBC 10.3 06/12/2011 0915   RBC 2.69* 06/12/2011 0915   HGB 9.4* 06/12/2011 0915   HCT 28.7* 06/12/2011 0915   PLT 278 06/12/2011 0915   MCV 106.7* 06/12/2011 0915   MCH 34.9* 06/12/2011 0915   MCHC 32.8 06/12/2011 0915   RDW 17.5* 06/12/2011 0915   LYMPHSABS 1.9 06/11/2011 2054   MONOABS 0.8 06/11/2011 2054   EOSABS 0.4 06/11/2011 2054   BASOSABS 0.0 06/11/2011 2054     Assessment 1. Pulm edema, symptomatically better 2. Htn, improved 3. Hypothyroid 4. Anemia, hg 9.4 5. ESRD 6. Sec HPTH  Plan: 1.Up walking halls and check O2 sats 2. Await echo 3. If O2  sats are ok then ? Home after echo Kilani Joffe T

## 2011-06-13 NOTE — Progress Notes (Signed)
  Echocardiogram 2D Echocardiogram has been performed.  Zebadiah Willert L 06/13/2011, 2:33 PM

## 2011-06-13 NOTE — Progress Notes (Signed)
Patient ambulated in hallway from her room past nursing station and back to her room. O2 sats were 94% while sitting at rest and were maintained at 94-97% on room air while ambulating. Patient denied having any shortness of breath or dizziness while ambulating.

## 2011-06-14 ENCOUNTER — Inpatient Hospital Stay (HOSPITAL_COMMUNITY): Payer: No Typology Code available for payment source

## 2011-06-14 MED ORDER — HEPARIN SODIUM (PORCINE) 1000 UNIT/ML DIALYSIS
100.0000 [IU]/kg | INTRAMUSCULAR | Status: DC | PRN
  Administered 2011-06-15: 6200 [IU] via INTRAVENOUS_CENTRAL
  Filled 2011-06-14: qty 7

## 2011-06-14 NOTE — Progress Notes (Signed)
Subjective:   No current complaints, slightly weak, but feeling better.  Objective: Vital signs in last 24 hours: Temp:  [98 F (36.7 C)-99.3 F (37.4 C)] 98 F (36.7 C) (04/08 0354) Pulse Rate:  [52-64] 52  (04/08 0354) Resp:  [16-17] 16  (04/08 0354) BP: (163-199)/(48-67) 168/48 mmHg (04/08 0354) SpO2:  [91 %-97 %] 97 % (04/08 0354) Weight:  [61.598 kg (135 lb 12.8 oz)] 61.598 kg (135 lb 12.8 oz) (04/07 2054) Weight change: 2.199 kg (4 lb 13.6 oz)  Intake/Output from previous day: 04/07 0701 - 04/08 0700 In: 720 [P.O.:720] Out: 0  Intake/Output this shift: Total I/O In: 3 [I.V.:3] Out: -    EXAM: General appearance:  Alert, in no apparent distress Resp:  CTA bilaterally  Cardio:  RRR with Gr II/VI systolic murmur GI:  + BS, soft and nontender Extremities:  No edema Access:  AVG @ RFA with + bruit  Lab Results:  Basename 06/12/11 0915 06/11/11 2054  WBC 10.3 10.8*  HGB 9.4* 8.4*  HCT 28.7* 26.0*  PLT 278 282   BMET:  Basename 06/11/11 2054  NA 143  K 4.6  CL 104  CO2 22  GLUCOSE 105*  BUN 64*  CREATININE 11.06*  CALCIUM 9.1  ALBUMIN 3.0*   No results found for this basename: PTH:2 in the last 72 hours Iron Studies: No results found for this basename: IRON,TIBC,TRANSFERRIN,FERRITIN in the last 72 hours  Assessment/Plan: 1.  Weakness - initially believed to be secondary to pulmonary edema (per chest x-ray on 4/5), missed HD on 4/4, symptoms better post-HD on 4/5 after net UF 3.4 L, but wt 61.6 yesterday (still over EDW of 59 kg); TSH was elevated (17.418) on 4/6, but pt was no longer on Synthroid, which was restarted at 100 mcg qd.  Pt had definite IS pulm edema at time of admission, as well as anemia, probably both contributing to presenting complaint (fatigue). Will plan HD in am with extra UF, try to challenge dry weight. Should be ready for discharge after HD tomorrow if remains stable.   2.  ESRD - on HD on TTS @ [facility], K 4.6 on 4/5.  HD tomorrow. 3.   Anemia - Hgb improved to 9.4, s/p Aranesp 60 mcg on Sat (Epogen 6000 U as outpatient). 4.  HTN/Volume - BP slightly high (recently 169/63), on Amlodipine 10 mg qd and Lisinopril 40 mg qd.   5.  Secondary hyperparathyroidism - Ca 9.1, P 5, on Phoslo 2 with meals, Zemplar 5 mcg per HD. 6.  Hypothyroidism - Synthroid restarted at 100 mcg qd.    LOS: 3 days   LYLES,CHARLES 06/14/2011,10:05 AM  Patient seen and examined and agree with assessment and plan as above with additions in bold.   Vinson Moselle  MD [facility] [phone] pgr    [phone] cell 06/14/2011, 2:06 PM

## 2011-06-15 ENCOUNTER — Inpatient Hospital Stay (HOSPITAL_COMMUNITY): Payer: No Typology Code available for payment source

## 2011-06-15 DIAGNOSIS — J811 Chronic pulmonary edema: Secondary | ICD-10-CM

## 2011-06-15 DIAGNOSIS — D631 Anemia in chronic kidney disease: Secondary | ICD-10-CM

## 2011-06-15 LAB — RENAL FUNCTION PANEL
CO2: 23 mEq/L (ref 19–32)
GFR calc Af Amer: 3 mL/min — ABNORMAL LOW (ref >90)
Glucose, Bld: 127 mg/dL — ABNORMAL HIGH (ref 70–99)
Phosphorus: 5.8 mg/dL — ABNORMAL HIGH (ref 2.3–4.6)
Potassium: 5.1 mEq/L (ref 3.5–5.1)
Sodium: 135 mEq/L (ref 135–145)

## 2011-06-15 LAB — CBC
Hemoglobin: 7.6 g/dL — ABNORMAL LOW (ref 12.0–15.0)
MCHC: 33.3 g/dL (ref 30.0–36.0)
RBC: 2.22 MIL/uL — ABNORMAL LOW (ref 3.87–5.11)

## 2011-06-15 LAB — PREPARE RBC (CROSSMATCH)

## 2011-06-15 MED ORDER — CALCIUM ACETATE 667 MG PO CAPS: 1334.0000 mg | ORAL_CAPSULE | Freq: Three times a day (TID) | ORAL | Status: AC

## 2011-06-15 MED ORDER — LEVOTHYROXINE SODIUM 100 MCG PO TABS: 100.0000 ug | ORAL_TABLET | Freq: Every day | ORAL | Status: DC

## 2011-06-15 MED ORDER — LABETALOL HCL 200 MG PO TABS: 200.0000 mg | ORAL_TABLET | Freq: Two times a day (BID) | ORAL | Status: DC

## 2011-06-15 MED ORDER — PARICALCITOL 5 MCG/ML IV SOLN
INTRAVENOUS | Status: AC
  Administered 2011-06-15: 5 ug via INTRAVENOUS
  Filled 2011-06-15: qty 1

## 2011-06-15 MED ORDER — LIDOCAINE-PRILOCAINE 2.5-2.5 % EX CREA
TOPICAL_CREAM | Freq: Once | CUTANEOUS | Status: AC
  Administered 2011-06-15: 1 via TOPICAL
  Filled 2011-06-15: qty 5

## 2011-06-15 MED ORDER — HEPARIN SODIUM (PORCINE) 1000 UNIT/ML DIALYSIS
2500.0000 [IU] | INTRAMUSCULAR | Status: DC | PRN
  Filled 2011-06-15: qty 3

## 2011-06-15 MED ORDER — ASPIRIN 81 MG PO TBEC: 81.0000 mg | DELAYED_RELEASE_TABLET | Freq: Every day | ORAL | Status: DC

## 2011-06-15 MED ORDER — CELECOXIB 200 MG PO CAPS: 200.0000 mg | ORAL_CAPSULE | Freq: Two times a day (BID) | ORAL | Status: AC

## 2011-06-15 MED ORDER — RENA-VITE PO TABS: 1.0000 | ORAL_TABLET | Freq: Every day | ORAL | Status: DC

## 2011-06-15 NOTE — Procedures (Signed)
I was present at this dialysis session. I have reviewed the session itself and made appropriate changes.   Vinson Moselle, MD [employer] 06/15/2011, 1:22 PM

## 2011-06-15 NOTE — Progress Notes (Signed)
Patient's Hb dropped into mid 7 range.  Will hold d/c and plan short HD tomorrow am with transfusion of 2u PRBC's, tentative d/c thereafter tomorrow.   Vinson Moselle  MD 06/15/2011, 3:08 PM

## 2011-06-15 NOTE — Progress Notes (Signed)
Subjective:  Feeling well, no complaints.  Objective: Vital signs in last 24 hours: Temp:  [97.7 F (36.5 C)-98.6 F (37 C)] 97.8 F (36.6 C) (04/09 0606) Pulse Rate:  [48-64] 48  (04/09 0606) Resp:  [17-20] 18  (04/09 0606) BP: (149-186)/(44-70) 159/44 mmHg (04/09 0606) SpO2:  [94 %-99 %] 99 % (04/09 0606) Weight:  [62.3 kg (137 lb 5.6 oz)] 62.3 kg (137 lb 5.6 oz) (04/08 2037) Weight change: 0.702 kg (1 lb 8.8 oz)  Intake/Output from previous day: 04/08 0701 - 04/09 0700 In: 726 [P.O.:720; I.V.:6] Out: -    EXAM: General appearance:  Alert, in no apparent distress Resp:  CTA bilaterally Cardio:  RRR with Gr II/VI systolic murmur GI:  + BS, soft and nontender Extremities:  No edema Access:  AVG @ RFA with + bruit   Lab Results: No results found for this basename: WBC:2,HGB:2,HCT:2,PLT:2 in the last 72 hours BMET: No results found for this basename: NA:2,K:2,CL:2,CO2:2,GLUCOSE:2,BUN:2,CREATININE:2,CALCIUM:2,PHOSPHORUS:2,ALBUMIN:2 in the last 72 hours No results found for this basename: PTH:2 in the last 72 hours Iron Studies: No results found for this basename: IRON,TIBC,TRANSFERRIN,FERRITIN in the last 72 hours  Assessment/Plan: 1.  Weakness - believed to be secondary to pulmonary edema (per chest x-ray on 4/5), missed HD on 4/4, symptoms better post-HD on 4/5 after net UF 3.4 L, but wt 62.3 yesterday (still over EDW of 59 kg); TSH was elevated (17.418) on 4/6, but pt was no longer on Synthroid, which was restarted at 100 mcg qd; also with anemia (Hgb down to 8.4 at admission). 2.  ESRD - on HD on TTS @ [facility], K 4.6 on 4/5. HD today.  3.  Anemia - Hgb improved to 9.4, s/p Aranesp 60 mcg on Sat (Epogen 6000 U as outpatient).  4.  HTN/Volume - BP slightly high (recently 159/44), on Amlodipine 10 mg qd and Lisinopril 40 mg qd; wt of 62.3 kg today (EDW 59 kg).  UF goal of 4 L today.  5.  Secondary hyperparathyroidism - Ca 9.1, P 5, on Phoslo 2 with meals, Zemplar 5 mcg per HD.  6.   Hypothyroidism - Synthroid restarted at 100 mcg qd.     LOS: 4 days   LYLES,CHARLES 06/15/2011,9:20 AM  Patient seen and examined and agree with assessment and plan as above. Probable discharge after HD today with lower dry weight. Synthroid restarted. Original symptoms have resolved.   Vinson Moselle  MD [facility] [employer] [phone] pgr    [phone] cell 06/15/2011, 1:21 PM

## 2011-06-16 ENCOUNTER — Inpatient Hospital Stay (HOSPITAL_COMMUNITY): Payer: No Typology Code available for payment source

## 2011-06-16 MED ORDER — LIDOCAINE-PRILOCAINE 2.5-2.5 % EX CREA
TOPICAL_CREAM | Freq: Once | CUTANEOUS | Status: AC
  Administered 2011-06-16: 11:00:00 via TOPICAL
  Filled 2011-06-16: qty 5

## 2011-06-16 MED ORDER — LEVOTHYROXINE SODIUM 100 MCG PO TABS: 100.0000 ug | ORAL_TABLET | Freq: Every day | ORAL | Status: DC

## 2011-06-16 NOTE — Progress Notes (Signed)
Subjective:  No current complaints  Objective: Vital signs in last 24 hours: Temp:  [97.8 F (36.6 C)-98.4 F (36.9 C)] 98.2 F (36.8 C) (04/10 0558) Pulse Rate:  [49-77] 54  (04/10 0558) Resp:  [15-26] 20  (04/10 0558) BP: (76-177)/(42-84) 166/60 mmHg (04/10 0558) SpO2:  [93 %-100 %] 97 % (04/10 0558) Weight:  [58.8 kg (129 lb 10.1 oz)-59 kg (130 lb 1.1 oz)] 59 kg (130 lb 1.1 oz) (04/09 2230) Weight change: 0.4 kg (14.1 oz)  Intake/Output from previous day: 04/09 0701 - 04/10 0700 In: 720 [P.O.:720] Out: 3451    EXAM: General appearance:  Alert, in no apparent distress Resp:  CTA bilaterally Cardio:  RRR with Gr II/VI systolic murmur GI:  + BS, soft and nontender Extremities:  No edema  Access:  AVG @ RFA with + bruit  Lab Results:  Basename 06/15/11 1044  WBC 7.4  HGB 7.6*  HCT 22.8*  PLT 258   BMET:  Basename 06/15/11 1044  NA 135  K 5.1  CL 95*  CO2 23  GLUCOSE 127*  BUN 72*  CREATININE 11.62*  CALCIUM 8.7  ALBUMIN 2.7*   No results found for this basename: PTH:2 in the last 72 hours Iron Studies: No results found for this basename: IRON,TIBC,TRANSFERRIN,FERRITIN in the last 72 hours  Assessment/Plan: 1.  Weakness - believed to be secondary to pulmonary edema (per chest x-ray on 4/5), missed HD on 4/4, symptoms better s/p HD on 4/5 and 4/9 with post-HD wt of 59 kg yesterday (EDW of 59 kg); TSH was elevated (17.418) on 4/6, but pt was no longer on Synthroid, which was restarted at 100 mcg qd; also with anemia (Hgb down to 8.4 at admission).  2.  ESRD - on HD on TTS @ [facility], K 5.1 pre-HD yesterday.  Extra HD today.  3.  Anemia - Hgb improved to 9.4, s/p Aranesp 60 mcg on Sat (Epogen 6000 U as outpatient), but fell to 7.6 yesterday delaying DC.  HD today for 2.5 hrs to transfuse 2 units of PRBCs.  4.  HTN/Volume - BP slightly high (recently 166/60), on Amlodipine 10 mg qd and Lisinopril 40 mg qd; post-HD wt of 59 kg yesteday (EDW 59 kg). UF goal of 2-3 L today.    5.  Secondary hyperparathyroidism - Ca 8.7, P 5, on Phoslo 2 with meals, Zemplar 5 mcg per HD.  6.  Hypothyroidism - Synthroid restarted at 100 mcg qd.     LOS: 5 days   LYLES,CHARLES 06/16/2011,9:57 AM  Patient seen and examined and agree with assessment and plan as above.  Vinson Moselle  MD [facility] [phone] pgr    [phone] cell 06/16/2011, 5:10 PM

## 2011-06-16 NOTE — Discharge Summary (Signed)
Physician Discharge Summary  Patient ID: Nicole [name] MRN: [id number] DOB/AGE: [id number] 76 y.o.  Admit date: 06/11/2011 Discharge date: 06/16/2011  Discharge Diagnoses:  Principal Problem:  *Pulmonary edema Active Problems:  Anemia in chronic kidney disease  Hypothyroidism  ESRD (end stage renal disease) on dialysis   Discharged Condition: good  Hospital Course:  1. Pulmonary edema: patient admitted with fatigue secondary to pulm edema and anemia. She had her dry weight lowered from 59 to 57 kg, may need further reduction as outpatient. Repeat CXR showed clearing of edema and fatigue resolved.  2. Anemia- transfused 2 units PRBC's on day of discharge for Hb of 7.6.   3. ESRD- had usual HD in hospital and extra HD on Wed 4/10 the day of discharge to give blood and challenge EDW.  4. Hypothyroidism- patient says she wasn't taking thyroid medication at home.  TSH was elevated at 17.4 and pt was restarted on her synthroid at usual dose of 100 mcg/day and Rx was given at discharge. 5.  HTN- discharge on norvasc and lisinopril; she has not taken labetalol since 2010 acc to her pharmacy, will d/c from HD med list.  Also not taking omerprazole or simvastatin.  Will d/c these as well.  Discharge Exam: Blood pressure 157/69, pulse 55, temperature 97.6 F (36.4 C), temperature source Oral, resp. rate 18, height 5\' 2"  (1.575 m), weight 57.7 kg (127 lb 3.3 oz), SpO2 98.00%. See prog notes  Disposition: 01-Home or Self Care   Medication List  As of 06/16/2011  5:17 PM   TAKE these medications         amLODipine 10 MG tablet   Commonly known as: NORVASC   Take 10 mg by mouth at bedtime.      aspirin 81 MG EC tablet   Take 1 tablet (81 mg total) by mouth daily.      calcium acetate 667 MG capsule   Commonly known as: PHOSLO   Take 2 capsules (1,334 mg total) by mouth 3 (three) times daily with meals.      celecoxib 200 MG capsule   Commonly known as: CELEBREX   Take 1 capsule (200 mg  total) by mouth 2 (two) times daily.      levothyroxine 100 MCG tablet   Commonly known as: SYNTHROID, LEVOTHROID   Take 1 tablet (100 mcg total) by mouth daily before breakfast.      lisinopril 40 MG tablet   Commonly known as: PRINIVIL,ZESTRIL   Take 40 mg by mouth at bedtime.      multivitamin Tabs tablet   Take 1 tablet by mouth at bedtime.           Follow-up Information    Follow up with COLADONATO,JOSEPH A, MD. (Resume usual outpatient HD schedule on Tues, Thurs, and Sat as previous)          Signed: Lealon Vanputten D 06/16/2011, 5:17 PM

## 2011-06-16 NOTE — Progress Notes (Signed)
Hemodialysis- Pt due for 1st round HD treatment. When arrived to pt room she stated she has not had her EMLA cream yet. Cream ordered and will get pt for HD asap. KHurtRN

## 2011-06-17 LAB — TYPE AND SCREEN
Donor AG Type: NEGATIVE
Unit division: 0
Unit division: 0

## 2011-06-18 LAB — CULTURE, BLOOD (ROUTINE X 2)
Culture  Setup Time: 201304061147
Culture: NO GROWTH

## 2011-06-21 LAB — CULTURE, BLOOD (ROUTINE X 2)

## 2011-08-16 ENCOUNTER — Other Ambulatory Visit (HOSPITAL_COMMUNITY): Payer: Self-pay | Admitting: Nephrology

## 2011-08-16 DIAGNOSIS — N186 End stage renal disease: Secondary | ICD-10-CM

## 2011-08-18 ENCOUNTER — Telehealth (HOSPITAL_COMMUNITY): Payer: Self-pay

## 2011-08-18 ENCOUNTER — Ambulatory Visit (HOSPITAL_COMMUNITY): Payer: No Typology Code available for payment source

## 2011-08-18 NOTE — Telephone Encounter (Signed)
Nicole [name] came in today a little confused.  She thought that she had an appt today.  I expressed to her and her family that [name] from her kidney center called in and stated that the pt had no transportation to get to her appt so we rescheduled for Monday the 17th.  Her niece stated that the pt told her she had an appt today.  Unfortunately, the pt does not drive.  She is going to contact her son to get him to bring her Monday.

## 2011-08-23 ENCOUNTER — Ambulatory Visit (HOSPITAL_COMMUNITY)
Admission: RE | Admit: 2011-08-23 | Discharge: 2011-08-23 | Disposition: A | Discharge status: Home / Self Care | Payer: No Typology Code available for payment source | Source: Ambulatory Visit | Attending: Nephrology | Admitting: Nephrology

## 2011-08-23 ENCOUNTER — Other Ambulatory Visit (HOSPITAL_COMMUNITY): Payer: Self-pay | Admitting: Nephrology

## 2011-08-23 DIAGNOSIS — Y832 Surgical operation with anastomosis, bypass or graft as the cause of abnormal reaction of the patient, or of later complication, without mention of misadventure at the time of the procedure: Secondary | ICD-10-CM | POA: Insufficient documentation

## 2011-08-23 DIAGNOSIS — N186 End stage renal disease: Secondary | ICD-10-CM | POA: Insufficient documentation

## 2011-08-23 DIAGNOSIS — T82898A Other specified complication of vascular prosthetic devices, implants and grafts, initial encounter: Secondary | ICD-10-CM | POA: Insufficient documentation

## 2011-08-23 DIAGNOSIS — I871 Compression of vein: Secondary | ICD-10-CM | POA: Insufficient documentation

## 2011-08-23 DIAGNOSIS — I120 Hypertensive chronic kidney disease with stage 5 chronic kidney disease or end stage renal disease: Secondary | ICD-10-CM | POA: Insufficient documentation

## 2011-08-23 MED ORDER — FENTANYL CITRATE 0.05 MG/ML IJ SOLN
INTRAMUSCULAR | Status: AC
  Filled 2011-08-23: qty 4

## 2011-08-23 MED ORDER — MIDAZOLAM HCL 5 MG/5ML IJ SOLN
INTRAMUSCULAR | Status: AC | PRN
  Administered 2011-08-23 (×2): 1 mg via INTRAVENOUS

## 2011-08-23 MED ORDER — MIDAZOLAM HCL 2 MG/2ML IJ SOLN
INTRAMUSCULAR | Status: AC
  Filled 2011-08-23: qty 4

## 2011-08-23 MED ORDER — FENTANYL CITRATE 0.05 MG/ML IJ SOLN
INTRAMUSCULAR | Status: AC | PRN
  Administered 2011-08-23: 50 ug via INTRAVENOUS

## 2011-08-23 MED ORDER — IOHEXOL 300 MG/ML  SOLN
100.0000 mL | Freq: Once | INTRAMUSCULAR | Status: AC | PRN
  Administered 2011-08-23: 60 mL via INTRAVENOUS

## 2011-08-23 NOTE — H&P (Signed)
Nicole [name] is an 78 y.o. female.   Chief Complaint: Decreased RUE AFG flow HPI: See above  Past Medical History  Diagnosis Date  . Hypertension     Past Surgical History  Procedure Date  . Abdominal surgery     No family history on file. Social History:  reports that she has been smoking.  She does not have any smokeless tobacco history on file. She reports that she does not use illicit drugs. Her alcohol history not on file.  Allergies: No Known Allergies   (Not in a hospital admission)  No results found for this or any previous visit (from the past 48 hour(s)). No results found.  ROS  There were no vitals taken for this visit. Physical Exam  Constitutional: She is oriented to person, place, and time.  Eyes: Pupils are equal, round, and reactive to light.  Neck: Normal range of motion.  Cardiovascular: Normal rate, regular rhythm and normal heart sounds.   Respiratory: Effort normal and breath sounds normal.  GI: Soft. Bowel sounds are normal.  Musculoskeletal: Normal range of motion.  Neurological: She is alert and oriented to person, place, and time.  Skin: Skin is warm and dry.  Psychiatric: She has a normal mood and affect.     Assessment/Plan Venous PTA  Caretha Rumbaugh,ART A 08/23/2011, 2:54 PM

## 2011-08-23 NOTE — Procedures (Signed)
RUE AVG, venous PTA 7mm No comp

## 2011-08-23 NOTE — Discharge Instructions (Addendum)
Moderate Sedation, Adult Moderate sedation is given to help you relax or even sleep through a procedure. You may remain sleepy, be clumsy, or have poor balance for several hours following this procedure. Arrange for a responsible adult, family member, or friend to take you home. A responsible adult should stay with you for at least 24 hours or until the medicines have worn off.  Do not participate in any activities where you could become injured for the next 24 hours, or until you feel normal again. Do not:   Drive.   Swim.   Ride a bicycle.   Operate heavy machinery.   Cook.   Use power tools.   Climb ladders.   Work at heights.   Do not make important decisions or sign legal documents until you are improved.   Vomiting may occur if you eat too soon. When you can drink without vomiting, try water, juice, or soup. Try solid foods if you feel little or no nausea.   Only take over-the-counter or prescription medications for pain, discomfort, or fever as directed by your caregiver.If pain medications have been prescribed for you, ask your caregiver how soon it is safe to take them.   Make sure you and your family fully understands everything about the medication given to you. Make sure you understand what side effects may occur.   You should not drink alcohol, take sleeping pills, or medications that cause drowsiness for at least 24 hours.   If you smoke, do not smoke alone.   If you are feeling better, you may resume normal activities 24 hours after receiving sedation.   Keep all appointments as scheduled. Follow all instructions.   Ask questions if you do not understand.  SEEK MEDICAL CARE IF:   Your skin is pale or bluish in color.   You continue to feel sick to your stomach (nauseous) or throw up (vomit).   Your pain is getting worse and not helped by medication.   You have bleeding or swelling.   You are still sleepy or feeling clumsy after 24 hours.  SEEK IMMEDIATE  MEDICAL CARE IF:   You develop a rash.   You have difficulty breathing.   You develop any type of allergic problem.   You have a fever.  Document Released: 11/17/2000 Document Revised: 02/11/2011 Document Reviewed: 04/10/2007 [employer] Patient Information 2012 [employer]. 

## 2011-08-27 ENCOUNTER — Ambulatory Visit: Payer: No Typology Code available for payment source | Admitting: Vascular Surgery

## 2011-09-07 ENCOUNTER — Other Ambulatory Visit: Payer: Self-pay

## 2011-09-07 DIAGNOSIS — T82898A Other specified complication of vascular prosthetic devices, implants and grafts, initial encounter: Secondary | ICD-10-CM

## 2011-09-08 IMAGING — CR DG CHEST 2V
2 series · 2 of 2 positions shown · non-contrast
Comparison: Portable chest x-ray of 04/18/2009

CLINICAL DATA: Congestive heart failure, renal failure

CHEST - 2 VIEW

[w chest pa]
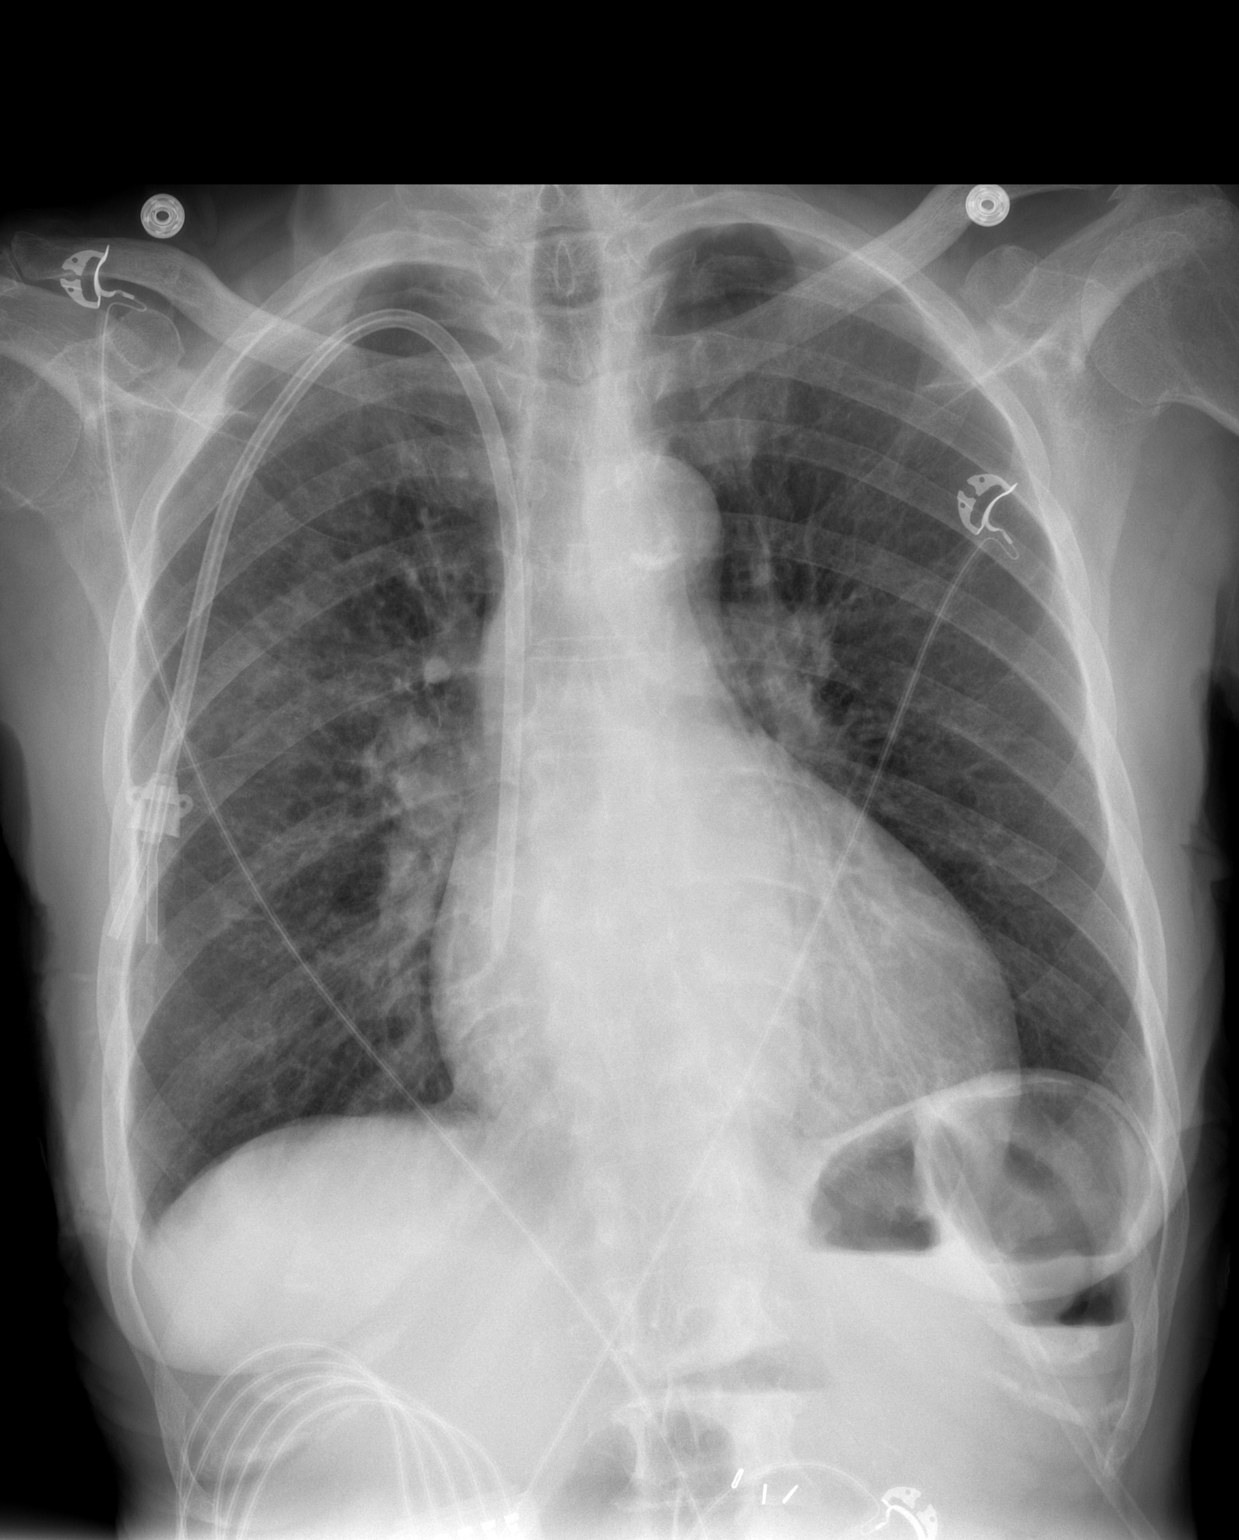

[w chest lat]
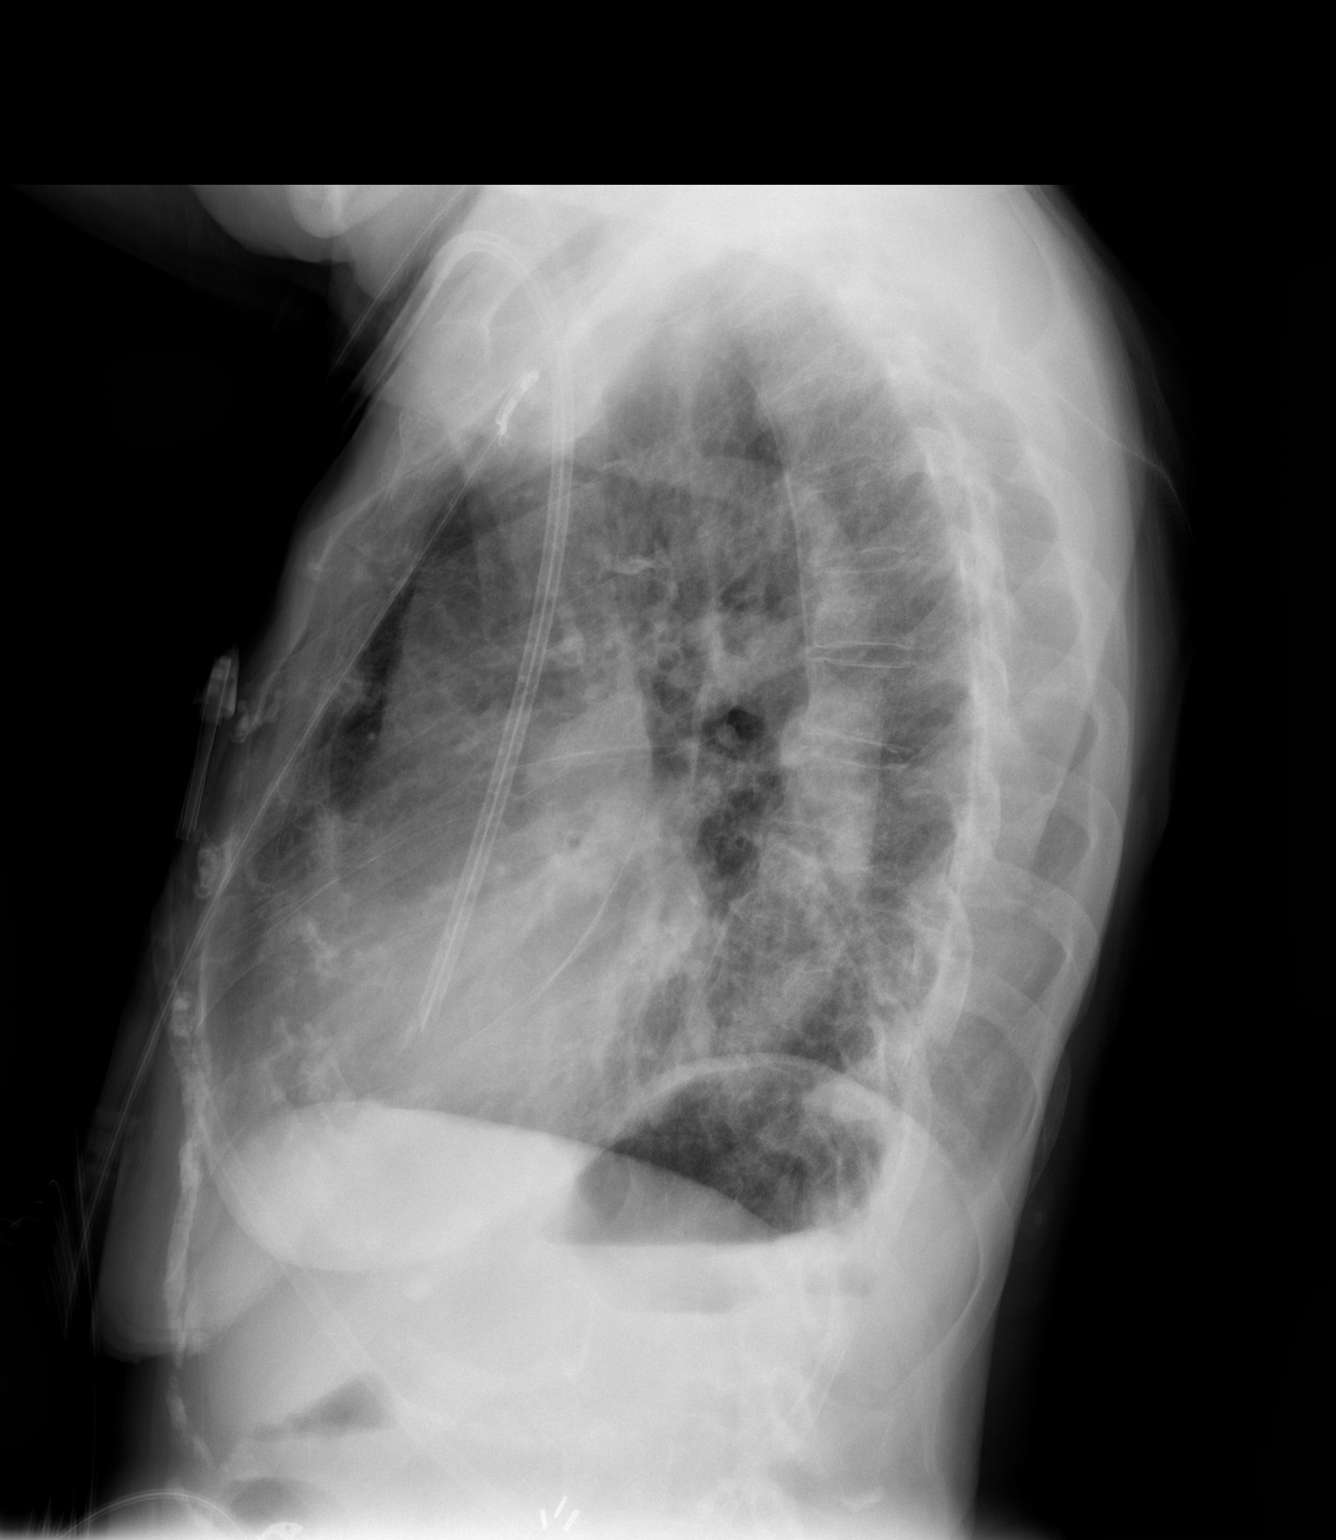

[2 of 2 positions shown; findings below may reference images not displayed]

FINDINGS: Mild pulmonary vascular congestion remains. The
previously noted mild edema has improved. Cardiomegaly is stable.
Dialysis catheter tips remain in the right atrium.  No effusion is
seen.  No bony abnormality is noted.
IMPRESSION: Stable cardiomegaly with mild pulmonary vascular congestion.
Previous edema has improved.

## 2011-09-23 ENCOUNTER — Ambulatory Visit (HOSPITAL_COMMUNITY): Admission: RE | Admit: 2011-09-23 | Payer: No Typology Code available for payment source | Source: Ambulatory Visit

## 2011-09-23 ENCOUNTER — Other Ambulatory Visit (HOSPITAL_COMMUNITY): Payer: Self-pay | Admitting: Medical

## 2011-09-23 ENCOUNTER — Other Ambulatory Visit: Payer: Self-pay | Admitting: Medical

## 2011-09-23 DIAGNOSIS — T82868A Thrombosis of vascular prosthetic devices, implants and grafts, initial encounter: Secondary | ICD-10-CM

## 2011-09-24 ENCOUNTER — Ambulatory Visit (HOSPITAL_COMMUNITY)
Admission: RE | Admit: 2011-09-24 | Discharge: 2011-09-24 | Disposition: A | Discharge status: Home / Self Care | Payer: No Typology Code available for payment source | Source: Ambulatory Visit | Attending: Medical | Admitting: Medical

## 2011-09-24 ENCOUNTER — Encounter: Payer: Self-pay | Admitting: Surgery

## 2011-09-24 ENCOUNTER — Other Ambulatory Visit: Payer: Self-pay | Admitting: Medical

## 2011-09-24 ENCOUNTER — Encounter (HOSPITAL_COMMUNITY): Payer: Self-pay

## 2011-09-24 DIAGNOSIS — M129 Arthropathy, unspecified: Secondary | ICD-10-CM | POA: Insufficient documentation

## 2011-09-24 DIAGNOSIS — Z86711 Personal history of pulmonary embolism: Secondary | ICD-10-CM | POA: Insufficient documentation

## 2011-09-24 DIAGNOSIS — F3289 Other specified depressive episodes: Secondary | ICD-10-CM | POA: Insufficient documentation

## 2011-09-24 DIAGNOSIS — F411 Generalized anxiety disorder: Secondary | ICD-10-CM | POA: Insufficient documentation

## 2011-09-24 DIAGNOSIS — T82898A Other specified complication of vascular prosthetic devices, implants and grafts, initial encounter: Secondary | ICD-10-CM | POA: Insufficient documentation

## 2011-09-24 DIAGNOSIS — I82609 Acute embolism and thrombosis of unspecified veins of unspecified upper extremity: Secondary | ICD-10-CM | POA: Insufficient documentation

## 2011-09-24 DIAGNOSIS — T82868A Thrombosis of vascular prosthetic devices, implants and grafts, initial encounter: Secondary | ICD-10-CM

## 2011-09-24 DIAGNOSIS — Y832 Surgical operation with anastomosis, bypass or graft as the cause of abnormal reaction of the patient, or of later complication, without mention of misadventure at the time of the procedure: Secondary | ICD-10-CM | POA: Insufficient documentation

## 2011-09-24 DIAGNOSIS — I120 Hypertensive chronic kidney disease with stage 5 chronic kidney disease or end stage renal disease: Secondary | ICD-10-CM | POA: Insufficient documentation

## 2011-09-24 DIAGNOSIS — I739 Peripheral vascular disease, unspecified: Secondary | ICD-10-CM | POA: Insufficient documentation

## 2011-09-24 DIAGNOSIS — I76 Septic arterial embolism: Secondary | ICD-10-CM | POA: Insufficient documentation

## 2011-09-24 DIAGNOSIS — F329 Major depressive disorder, single episode, unspecified: Secondary | ICD-10-CM | POA: Insufficient documentation

## 2011-09-24 DIAGNOSIS — K449 Diaphragmatic hernia without obstruction or gangrene: Secondary | ICD-10-CM | POA: Insufficient documentation

## 2011-09-24 DIAGNOSIS — N186 End stage renal disease: Secondary | ICD-10-CM | POA: Insufficient documentation

## 2011-09-24 HISTORY — DX: Hypothyroidism, unspecified: E03.9

## 2011-09-24 HISTORY — DX: Other pulmonary embolism without acute cor pulmonale: I26.99

## 2011-09-24 HISTORY — DX: Unspecified osteoarthritis, unspecified site: M19.90

## 2011-09-24 HISTORY — DX: Abdominal aortic aneurysm, without rupture: I71.4

## 2011-09-24 HISTORY — DX: Diaphragmatic hernia without obstruction or gangrene: K44.9

## 2011-09-24 HISTORY — DX: Iron deficiency anemia, unspecified: D50.9

## 2011-09-24 HISTORY — DX: Peripheral vascular disease, unspecified: I73.9

## 2011-09-24 HISTORY — DX: Other specified anxiety disorders: F41.8

## 2011-09-24 HISTORY — DX: End stage renal disease: N18.6

## 2011-09-24 MED ORDER — IOHEXOL 300 MG/ML  SOLN
100.0000 mL | Freq: Once | INTRAMUSCULAR | Status: AC | PRN
  Administered 2011-09-24: 40 mL via INTRAVENOUS

## 2011-09-24 MED ORDER — MIDAZOLAM HCL 2 MG/2ML IJ SOLN
INTRAMUSCULAR | Status: AC
  Filled 2011-09-24: qty 4

## 2011-09-24 MED ORDER — ALTEPLASE 100 MG IV SOLR
2.0000 mg | Freq: Once | INTRAVENOUS | Status: AC
  Administered 2011-09-24: 2 mg
  Filled 2011-09-24: qty 2

## 2011-09-24 MED ORDER — FENTANYL CITRATE 0.05 MG/ML IJ SOLN
INTRAMUSCULAR | Status: AC | PRN
  Administered 2011-09-24 (×2): 25 ug via INTRAVENOUS
  Administered 2011-09-24: 50 ug via INTRAVENOUS

## 2011-09-24 MED ORDER — FENTANYL CITRATE 0.05 MG/ML IJ SOLN
INTRAMUSCULAR | Status: AC
  Filled 2011-09-24: qty 4

## 2011-09-24 MED ORDER — HEPARIN SODIUM (PORCINE) 1000 UNIT/ML IJ SOLN
INTRAMUSCULAR | Status: AC
  Administered 2011-09-24: 3000 [IU]
  Filled 2011-09-24: qty 1

## 2011-09-24 MED ORDER — MIDAZOLAM HCL 5 MG/5ML IJ SOLN
INTRAMUSCULAR | Status: AC | PRN
  Administered 2011-09-24: 2 mg via INTRAVENOUS

## 2011-09-24 NOTE — H&P (Signed)
Nicole [name] is an 78 y.o. female.   Chief Complaint: Right forearm AVG clotted.  HPI: ESRD on HD> Rt forearm AVG created 07/24/10, declotted 12/05/10, angioplasty 08/23/11. Noted to be clotted when presented for HD yesterday.    Past Medical History  Diagnosis Date  . Hypertension   . ESRD (end stage renal disease)   . Peripheral vascular disease     revascularization appx. 14 years ago  . Hypothyroidism   . Hiatal hernia   . Iron deficiency anemia   . Osteoarthritis   . Depression with anxiety   . Pulmonary embolism   . AAA (abdominal aortic aneurysm) 1999    aortic byfem bypass    Past Surgical History  Procedure Date  . Abdominal surgery about 14 years ago    REVASCULARIZATION  . Ateriovenous graft 04/18/2009    LEFT FOREARM avg  . Av fistula placement, radiocephalic 01/09/10  . Avgg removal 12/02/09    SECONDARY TO INFECTION LT FOREARM  . Av fistula placement, radiocephalic 05/08/10    NONMATURED  . Arteriovenous graft placement 07/24/10    RIGHT FOREARM LOOP  . Dg av dialysis graft declot or 12/05/10, 6/17/123, 09/24/11    CLOTTED GRAFT    No family history on file. Social History:  reports that she has been smoking.  She does not have any smokeless tobacco history on file. She reports that she does not use illicit drugs. Her alcohol history not on file.  Allergies: No Known Allergies  Review of Systems  Constitutional: Negative for fever, chills and weight loss.  Respiratory: Positive for wheezing. Negative for cough and sputum production.   Cardiovascular: Positive for leg swelling. Negative for chest pain.  Gastrointestinal: Negative for heartburn, nausea, vomiting and abdominal pain.  Musculoskeletal: Positive for joint pain.  Neurological: Negative for dizziness, sensory change, seizures and loss of consciousness.  Psychiatric/Behavioral: Positive for depression. Negative for substance abuse. The patient is nervous/anxious. The patient does not have insomnia.      Blood pressure 188/67, pulse 61, SpO2 98.00%. Physical Exam  Constitutional: She is oriented to person, place, and time. She appears well-developed. No distress.  HENT:  Head: Normocephalic and atraumatic.  Cardiovascular: Normal rate and regular rhythm.  Exam reveals no gallop and no friction rub.   Murmur heard. Respiratory: Effort normal. She has wheezes.       L>R   GI: Soft.  Musculoskeletal: Normal range of motion. She exhibits no edema.  Neurological: She is alert and oriented to person, place, and time.  Skin: Skin is warm and dry.  Psychiatric: She has a normal mood and affect. Her behavior is normal. Thought content normal.     Assessment/Plan Procedure details for declot procedure reviewed with patient. Potential complications including but not limited to infection, bleeding, vessel damage, complications with moderate sedation if used, and possible unsuccessful declot requiring HD catheter placement. All patient's questions answered to his satisfaction. Written consent obtained. Patient to present to HD tomorrow for normal scheduled dialysis.   Aubreana Cornacchia D 09/24/2011, 1:17 PM

## 2011-09-24 NOTE — Procedures (Signed)
Successful R FA AVG DECLOT WITH PTA NO COMP STABLE READY FOR USE FULL REPORT IN PACS

## 2011-09-27 ENCOUNTER — Ambulatory Visit: Payer: No Typology Code available for payment source | Admitting: Surgery

## 2011-09-27 ENCOUNTER — Telehealth (HOSPITAL_COMMUNITY): Payer: Self-pay | Admitting: *Deleted

## 2011-09-27 NOTE — Telephone Encounter (Signed)
Spoke with pt, says she's doing well.  Post procedure follow up call

## 2011-12-08 ENCOUNTER — Other Ambulatory Visit (HOSPITAL_COMMUNITY): Payer: Self-pay | Admitting: Nephrology

## 2011-12-08 ENCOUNTER — Institutional Professional Consult (permissible substitution): Payer: No Typology Code available for payment source | Admitting: Cardiology

## 2011-12-08 DIAGNOSIS — N186 End stage renal disease: Secondary | ICD-10-CM

## 2011-12-17 ENCOUNTER — Ambulatory Visit (HOSPITAL_COMMUNITY)
Admission: RE | Admit: 2011-12-17 | Discharge: 2011-12-17 | Disposition: A | Discharge status: Home / Self Care | Payer: No Typology Code available for payment source | Source: Ambulatory Visit | Attending: Nephrology | Admitting: Nephrology

## 2011-12-17 DIAGNOSIS — N186 End stage renal disease: Secondary | ICD-10-CM

## 2011-12-21 HISTORY — PX: ANGIOPLASTY: SHX39

## 2011-12-22 ENCOUNTER — Other Ambulatory Visit (HOSPITAL_COMMUNITY): Payer: Self-pay | Admitting: Nephrology

## 2011-12-22 ENCOUNTER — Encounter (HOSPITAL_COMMUNITY): Payer: Self-pay

## 2011-12-22 ENCOUNTER — Ambulatory Visit (HOSPITAL_COMMUNITY)
Admission: RE | Admit: 2011-12-22 | Discharge: 2011-12-22 | Disposition: A | Discharge status: Home / Self Care | Payer: No Typology Code available for payment source | Source: Ambulatory Visit | Attending: Nephrology | Admitting: Nephrology

## 2011-12-22 DIAGNOSIS — I120 Hypertensive chronic kidney disease with stage 5 chronic kidney disease or end stage renal disease: Secondary | ICD-10-CM | POA: Insufficient documentation

## 2011-12-22 DIAGNOSIS — N186 End stage renal disease: Secondary | ICD-10-CM

## 2011-12-22 DIAGNOSIS — Z86711 Personal history of pulmonary embolism: Secondary | ICD-10-CM | POA: Insufficient documentation

## 2011-12-22 DIAGNOSIS — Y849 Medical procedure, unspecified as the cause of abnormal reaction of the patient, or of later complication, without mention of misadventure at the time of the procedure: Secondary | ICD-10-CM | POA: Insufficient documentation

## 2011-12-22 DIAGNOSIS — Z992 Dependence on renal dialysis: Secondary | ICD-10-CM | POA: Insufficient documentation

## 2011-12-22 DIAGNOSIS — E039 Hypothyroidism, unspecified: Secondary | ICD-10-CM | POA: Insufficient documentation

## 2011-12-22 DIAGNOSIS — T82898A Other specified complication of vascular prosthetic devices, implants and grafts, initial encounter: Secondary | ICD-10-CM | POA: Insufficient documentation

## 2011-12-22 DIAGNOSIS — D509 Iron deficiency anemia, unspecified: Secondary | ICD-10-CM | POA: Insufficient documentation

## 2011-12-22 MED ORDER — IOHEXOL 300 MG/ML  SOLN
100.0000 mL | Freq: Once | INTRAMUSCULAR | Status: AC | PRN
  Administered 2011-12-22: 50 mL via INTRAVENOUS

## 2011-12-22 NOTE — Procedures (Signed)
Successful RT FA AVG SHUNTOGRAM WITH PTA No comp Stable Ready for use Full report in pacs

## 2011-12-22 NOTE — H&P (Signed)
Chief Complaint: Excessive bleeding after HD from Rt arm AVG Referring Physician:Coladonato HPI: Nicole [name] is an 78 y.o. female with ESRD. Having excessive bleeding after HD. Hx of stenosis and clotting in the past. Familiar with PTA dn declot procedures, last intervention 7/13. Here today for shuntogram and poss intervention.  Past Medical History:  Past Medical History  Diagnosis Date  . Hypertension   . ESRD (end stage renal disease)   . Peripheral vascular disease     revascularization appx. 14 years ago  . Hypothyroidism   . Hiatal hernia   . Iron deficiency anemia   . Osteoarthritis   . Depression with anxiety   . Pulmonary embolism   . AAA (abdominal aortic aneurysm) 1999    aortic byfem bypass    Past Surgical History:  Past Surgical History  Procedure Date  . Abdominal surgery about 14 years ago    REVASCULARIZATION  . Ateriovenous graft 04/18/2009    LEFT FOREARM avg  . Av fistula placement, radiocephalic 01/09/10  . Avgg removal 12/02/09    SECONDARY TO INFECTION LT FOREARM  . Av fistula placement, radiocephalic 05/08/10    NONMATURED  . Arteriovenous graft placement 07/24/10    RIGHT FOREARM LOOP  . Dg av dialysis graft declot or 12/05/10, 6/17/123, 09/24/11    CLOTTED GRAFT    Family History: No family history on file.  Social History:  reports that she has been smoking.  She does not have any smokeless tobacco history on file. She reports that she does not use illicit drugs. Her alcohol history not on file.  Allergies: No Known Allergies  Medications: ASA 81, Phoslo 667, Synthroid, Prinivil, Rena-Vit  Please HPI for pertinent positives, otherwise complete 10 system ROS negative.  Physical Exam: There were no vitals taken for this visit. There is no height or weight on file to calculate BMI.   General Appearance:  Alert, cooperative, no distress, appears stated age  Head:  Normocephalic, without obvious abnormality, atraumatic  ENT: Unremarkable    Neck: Supple, symmetrical, trachea midline, no adenopathy, thyroid: not enlarged, symmetric, no tenderness/mass/nodules  Lungs:   Clear to auscultation bilaterally, no w/r/r, respirations unlabored without use of accessory muscles.  Chest Wall:  No tenderness or deformity  Heart:  Regular rate and rhythm, S1, S2 normal, no murmur, rub or gallop. Carotids 2+ without bruit.  Abdomen:   Soft, non-tender, non distended. Bowel sounds active all four quadrants,  no masses, no organomegaly.  Extremities: Rt arm AV loop graft palpable, ecchymosis from recent tx,  Neurologic: Normal affect, no gross deficits.   No results found for this or any previous visit (from the past 48 hour(s)). No results found.  Assessment/Plan ESRD Recurrent stenosis of (R) AVG For completion shuntogram and PTA Pt understands procedure and risks. Consent signed in chart   Brayton El PA-C 12/22/2011, 11:00 AM

## 2011-12-28 ENCOUNTER — Institutional Professional Consult (permissible substitution): Payer: No Typology Code available for payment source | Admitting: Cardiology

## 2012-01-25 ENCOUNTER — Other Ambulatory Visit (HOSPITAL_COMMUNITY): Payer: Self-pay

## 2012-01-25 DIAGNOSIS — Z992 Dependence on renal dialysis: Secondary | ICD-10-CM

## 2012-01-26 ENCOUNTER — Encounter (HOSPITAL_COMMUNITY): Payer: Self-pay

## 2012-01-26 ENCOUNTER — Ambulatory Visit (HOSPITAL_COMMUNITY)
Admission: RE | Admit: 2012-01-26 | Discharge: 2012-01-26 | Disposition: A | Discharge status: Home / Self Care | Payer: No Typology Code available for payment source | Source: Ambulatory Visit | Attending: Nephrology | Admitting: Nephrology

## 2012-01-26 ENCOUNTER — Other Ambulatory Visit (HOSPITAL_COMMUNITY): Payer: Self-pay

## 2012-01-26 VITALS — BP 149/85 | HR 54 | Resp 18

## 2012-01-26 DIAGNOSIS — N186 End stage renal disease: Secondary | ICD-10-CM

## 2012-01-26 DIAGNOSIS — Z992 Dependence on renal dialysis: Secondary | ICD-10-CM

## 2012-01-26 DIAGNOSIS — T82898A Other specified complication of vascular prosthetic devices, implants and grafts, initial encounter: Secondary | ICD-10-CM | POA: Insufficient documentation

## 2012-01-26 DIAGNOSIS — I120 Hypertensive chronic kidney disease with stage 5 chronic kidney disease or end stage renal disease: Secondary | ICD-10-CM | POA: Insufficient documentation

## 2012-01-26 DIAGNOSIS — I871 Compression of vein: Secondary | ICD-10-CM | POA: Insufficient documentation

## 2012-01-26 DIAGNOSIS — Y832 Surgical operation with anastomosis, bypass or graft as the cause of abnormal reaction of the patient, or of later complication, without mention of misadventure at the time of the procedure: Secondary | ICD-10-CM | POA: Insufficient documentation

## 2012-01-26 LAB — POTASSIUM: Potassium: 4.1 mEq/L (ref 3.5–5.1)

## 2012-01-26 MED ORDER — HEPARIN SODIUM (PORCINE) 1000 UNIT/ML IJ SOLN
INTRAMUSCULAR | Status: AC | PRN
  Administered 2012-01-26: 3000 [IU] via INTRAVENOUS

## 2012-01-26 MED ORDER — HEPARIN SODIUM (PORCINE) 1000 UNIT/ML IJ SOLN
INTRAMUSCULAR | Status: AC
  Filled 2012-01-26: qty 1

## 2012-01-26 MED ORDER — MIDAZOLAM HCL 2 MG/2ML IJ SOLN
INTRAMUSCULAR | Status: AC
  Filled 2012-01-26: qty 4

## 2012-01-26 MED ORDER — MIDAZOLAM HCL 2 MG/2ML IJ SOLN
INTRAMUSCULAR | Status: AC | PRN
  Administered 2012-01-26: 0.5 mg via INTRAVENOUS

## 2012-01-26 MED ORDER — IOHEXOL 300 MG/ML  SOLN
100.0000 mL | Freq: Once | INTRAMUSCULAR | Status: AC | PRN
  Administered 2012-01-26: 60 mL via INTRAVENOUS

## 2012-01-26 MED ORDER — FENTANYL CITRATE 0.05 MG/ML IJ SOLN
INTRAMUSCULAR | Status: AC | PRN
  Administered 2012-01-26: 25 ug via INTRAVENOUS
  Administered 2012-01-26: 50 ug via INTRAVENOUS

## 2012-01-26 MED ORDER — ALTEPLASE 100 MG IV SOLR
2.0000 mg | Freq: Once | INTRAVENOUS | Status: AC
  Administered 2012-01-26: 2 mg
  Filled 2012-01-26: qty 2

## 2012-01-26 MED ORDER — FENTANYL CITRATE 0.05 MG/ML IJ SOLN
INTRAMUSCULAR | Status: AC
  Filled 2012-01-26: qty 4

## 2012-01-26 NOTE — Procedures (Signed)
Successful declot of right forearm AV graft.  No immediate complication.  See dictated Radiology report.

## 2012-01-26 NOTE — H&P (Signed)
Nicole [name] is an 78 y.o. female.   Chief Complaint: clotted Right upper arm AVG.  Recently angioplastied in IR 12/2011. HPI: noted to be clotted upon arrival to HD.  Has had 3 declots on this access and  pta on 12/2011.  Past Medical History  Diagnosis Date  . Hypertension   . ESRD (end stage renal disease)   . Peripheral vascular disease     revascularization appx. 14 years ago  . Hypothyroidism   . Hiatal hernia   . Iron deficiency anemia   . Osteoarthritis   . Depression with anxiety   . Pulmonary embolism   . AAA (abdominal aortic aneurysm) 1999    aortic byfem bypass    Past Surgical History  Procedure Date  . Abdominal surgery about 14 years ago    REVASCULARIZATION  . Ateriovenous graft 04/18/2009    LEFT FOREARM avg  . Av fistula placement, radiocephalic 01/09/10  . Avgg removal 12/02/09    SECONDARY TO INFECTION LT FOREARM  . Av fistula placement, radiocephalic 05/08/10    NONMATURED  . Arteriovenous graft placement 07/24/10    RIGHT FOREARM LOOP  . Dg av dialysis graft declot or 12/05/10, 6/17/123, 09/24/11, 01/26/12    CLOTTED GRAFT  . Angioplasty 12/21/11    of RUE AVG   Patient denies any complications with anesthesia or moderate sedation.  Social History:  reports that she has been smoking.  She does not have any smokeless tobacco history on file. She reports that she does not use illicit drugs. Her alcohol history not on file.  Allergies: No Known Allergies    Medication List     As of 01/26/2012  8:42 AM    ASK your doctor about these medications         amLODipine 10 MG tablet   Commonly known as: NORVASC   Take 10 mg by mouth at bedtime.      aspirin 81 MG EC tablet   Take 1 tablet (81 mg total) by mouth daily.      calcium acetate 667 MG capsule   Commonly known as: PHOSLO   Take 2 capsules (1,334 mg total) by mouth 3 (three) times daily with meals.      levothyroxine 100 MCG tablet   Commonly known as: SYNTHROID, LEVOTHROID   Take 1  tablet (100 mcg total) by mouth daily before breakfast.      lisinopril 40 MG tablet   Commonly known as: PRINIVIL,ZESTRIL   Take 40 mg by mouth at bedtime.      multivitamin Tabs tablet   Take 1 tablet by mouth at bedtime.         Review of Systems  Constitutional: Positive for malaise/fatigue. Negative for fever and chills.  HENT: Positive for congestion.   Eyes: Positive for blurred vision.  Respiratory: Positive for cough. Negative for hemoptysis, sputum production, shortness of breath and wheezing.   Cardiovascular: Negative for chest pain and palpitations.  Gastrointestinal: Negative for nausea, vomiting and abdominal pain.  Musculoskeletal: Positive for joint pain.  Neurological: Positive for weakness.  Endo/Heme/Allergies: Negative.   Psychiatric/Behavioral: Positive for depression. Negative for memory loss. The patient is nervous/anxious.     Blood pressure 192/55, pulse 63, resp. rate 16, SpO2 100.00%. Physical Exam  Constitutional: She is oriented to person, place, and time. She appears well-developed. No distress.  HENT:  Head: Normocephalic and atraumatic.  Cardiovascular: Normal rate.  Exam reveals no gallop and no friction rub.   Murmur heard. Respiratory:  Effort normal and breath sounds normal. No respiratory distress. She has no wheezes.       Few scattered rhonchi - clears with cough   GI: Soft. Bowel sounds are normal.  Musculoskeletal: She exhibits edema.       RUE AVG with palpable pulse, no thrill or evidence of hematoma, erythema or pseudoaneurysm  Neurological: She is alert and oriented to person, place, and time.  Skin: Skin is warm and dry.  Psychiatric: She has a normal mood and affect. Her behavior is normal. Judgment and thought content normal.     Assessment/Plan Patient very familiar with declot procedures and angioplasty. Reviewed potential complications including but not limited to infection, bleeding, vessel damage, complications with  contrast and moderate sedation as well as unsuccessful declot requiring HD catheter placement. Patient verbalized her understanding and written consent obtained.     CAMPBELL,PAMELA D 01/26/2012, 8:38 AM

## 2012-01-26 NOTE — ED Notes (Signed)
Pt given sprite and crackers; tolerated well

## 2012-01-27 ENCOUNTER — Telehealth (HOSPITAL_COMMUNITY): Payer: Self-pay | Admitting: *Deleted

## 2012-02-04 ENCOUNTER — Emergency Department (HOSPITAL_COMMUNITY)
Admission: EM | Admit: 2012-02-04 | Discharge: 2012-02-04 | Disposition: A | Discharge status: Home / Self Care | Payer: No Typology Code available for payment source | Attending: Emergency Medicine | Admitting: Emergency Medicine

## 2012-02-04 ENCOUNTER — Encounter (HOSPITAL_COMMUNITY): Payer: Self-pay | Admitting: Emergency Medicine

## 2012-02-04 DIAGNOSIS — I120 Hypertensive chronic kidney disease with stage 5 chronic kidney disease or end stage renal disease: Secondary | ICD-10-CM | POA: Insufficient documentation

## 2012-02-04 DIAGNOSIS — Z862 Personal history of diseases of the blood and blood-forming organs and certain disorders involving the immune mechanism: Secondary | ICD-10-CM | POA: Insufficient documentation

## 2012-02-04 DIAGNOSIS — M545 Low back pain, unspecified: Secondary | ICD-10-CM | POA: Insufficient documentation

## 2012-02-04 DIAGNOSIS — Z8719 Personal history of other diseases of the digestive system: Secondary | ICD-10-CM | POA: Insufficient documentation

## 2012-02-04 DIAGNOSIS — M5430 Sciatica, unspecified side: Secondary | ICD-10-CM | POA: Insufficient documentation

## 2012-02-04 DIAGNOSIS — M549 Dorsalgia, unspecified: Secondary | ICD-10-CM

## 2012-02-04 DIAGNOSIS — Z86711 Personal history of pulmonary embolism: Secondary | ICD-10-CM | POA: Insufficient documentation

## 2012-02-04 DIAGNOSIS — Z79899 Other long term (current) drug therapy: Secondary | ICD-10-CM | POA: Insufficient documentation

## 2012-02-04 DIAGNOSIS — Z8739 Personal history of other diseases of the musculoskeletal system and connective tissue: Secondary | ICD-10-CM | POA: Insufficient documentation

## 2012-02-04 DIAGNOSIS — Z8659 Personal history of other mental and behavioral disorders: Secondary | ICD-10-CM | POA: Insufficient documentation

## 2012-02-04 DIAGNOSIS — I714 Abdominal aortic aneurysm, without rupture, unspecified: Secondary | ICD-10-CM | POA: Insufficient documentation

## 2012-02-04 DIAGNOSIS — F17200 Nicotine dependence, unspecified, uncomplicated: Secondary | ICD-10-CM | POA: Insufficient documentation

## 2012-02-04 DIAGNOSIS — E039 Hypothyroidism, unspecified: Secondary | ICD-10-CM | POA: Insufficient documentation

## 2012-02-04 DIAGNOSIS — N186 End stage renal disease: Secondary | ICD-10-CM | POA: Insufficient documentation

## 2012-02-04 DIAGNOSIS — Z8679 Personal history of other diseases of the circulatory system: Secondary | ICD-10-CM | POA: Insufficient documentation

## 2012-02-04 DIAGNOSIS — Z7982 Long term (current) use of aspirin: Secondary | ICD-10-CM | POA: Insufficient documentation

## 2012-02-04 DIAGNOSIS — Z992 Dependence on renal dialysis: Secondary | ICD-10-CM | POA: Insufficient documentation

## 2012-02-04 LAB — BASIC METABOLIC PANEL
CO2: 26 mEq/L (ref 19–32)
Calcium: 10.1 mg/dL (ref 8.4–10.5)
Sodium: 140 mEq/L (ref 135–145)

## 2012-02-04 LAB — CBC
MCV: 107.2 fL — ABNORMAL HIGH (ref 78.0–100.0)
Platelets: 396 10*3/uL (ref 150–400)
RBC: 3.21 MIL/uL — ABNORMAL LOW (ref 3.87–5.11)
WBC: 8.4 10*3/uL (ref 4.0–10.5)

## 2012-02-04 MED ORDER — OXYCODONE-ACETAMINOPHEN 5-325 MG PO TABS
1.0000 | ORAL_TABLET | Freq: Once | ORAL | Status: AC
  Administered 2012-02-04: 1 via ORAL
  Filled 2012-02-04: qty 1

## 2012-02-04 MED ORDER — IBUPROFEN 400 MG PO TABS
600.0000 mg | ORAL_TABLET | Freq: Once | ORAL | Status: AC
  Administered 2012-02-04: 600 mg via ORAL
  Filled 2012-02-04: qty 2

## 2012-02-04 MED ORDER — OXYCODONE-ACETAMINOPHEN 5-325 MG PO TABS: 1.0000 | ORAL_TABLET | ORAL | Status: DC | PRN

## 2012-02-04 MED ORDER — IBUPROFEN 400 MG PO TABS: 400.0000 mg | ORAL_TABLET | Freq: Four times a day (QID) | ORAL | Status: DC | PRN

## 2012-02-04 NOTE — ED Notes (Signed)
Pt states she is a Nicole [name], [id number] dialysis patient and wasn't able to go yesterday d/t the pain she was having. Pt states she did take her medications this morning.

## 2012-02-04 NOTE — ED Notes (Signed)
Assumed pt care. Pt sts that pain has improved and is currently resting comfortably. Pt informed RN that she does make urine but unable to provide urine sample at this time. Patient will let staff know once she is able to void. Will continue to monitor.

## 2012-02-04 NOTE — ED Provider Notes (Signed)
History     CSN: [id number]  Arrival date & time 02/04/12  1238   First MD Initiated Contact with Patient 02/04/12 1256      Chief Complaint  Patient presents with  . Back Pain    (Consider location/radiation/quality/duration/timing/severity/associated sxs/prior treatment) Patient is a 78 y.o. female presenting with back pain. The history is provided by the patient and a relative. No language interpreter was used.  Back Pain  This is a new problem. The current episode started more than 1 week ago. The problem occurs daily. The problem has been gradually worsening. The pain is associated with no known injury. Pain location: Left para lumbar pain rediates to R LE. The quality of the pain is described as shooting. The pain radiates to the right thigh. The pain is at a severity of 8/10. The pain is moderate. The symptoms are aggravated by certain positions. The pain is the same all the time. Pertinent negatives include no fever, no numbness, no bowel incontinence, no bladder incontinence, no dysuria, no pelvic pain, no paresthesias, no paresis and no tingling.   78 yo female with R lower back pain with radiation into the RLE posteriorly. No incontenence or weakness. No SOB or chest pain.  Difficulty ambulating.  Has taken nothing for pain.  ESRD. She missed dialysis yesterday.   B/P 185/91.  Did not take her blood pressure medication today. Here with her son who she lives with.   Past Medical History  Diagnosis Date  . Hypertension   . ESRD (end stage renal disease)   . Peripheral vascular disease     revascularization appx. 14 years ago  . Hypothyroidism   . Hiatal hernia   . Iron deficiency anemia   . Osteoarthritis   . Depression with anxiety   . Pulmonary embolism   . AAA (abdominal aortic aneurysm) 1999    aortic byfem bypass    Past Surgical History  Procedure Date  . Abdominal surgery about 14 years ago    REVASCULARIZATION  . Ateriovenous graft 04/18/2009    LEFT FOREARM  avg  . Av fistula placement, radiocephalic 01/09/10  . Avgg removal 12/02/09    SECONDARY TO INFECTION LT FOREARM  . Av fistula placement, radiocephalic 05/08/10    NONMATURED  . Arteriovenous graft placement 07/24/10    RIGHT FOREARM LOOP  . Dg av dialysis graft declot or 12/05/10, 6/17/123, 09/24/11, 01/26/12    CLOTTED GRAFT  . Angioplasty 12/21/11    of RUE AVG    History reviewed. No pertinent family history.  History  Substance Use Topics  . Smoking status: Current Every Day Smoker  . Smokeless tobacco: Not on file  . Alcohol Use:     OB History    Grav Para Term Preterm Abortions TAB SAB Ect Mult Living                  Review of Systems  Constitutional: Negative for fever.  Gastrointestinal: Negative for bowel incontinence.  Genitourinary: Negative for bladder incontinence, dysuria and pelvic pain.  Musculoskeletal: Positive for back pain.  Neurological: Negative for tingling, numbness and paresthesias.    Allergies  Review of patient's allergies indicates no known allergies.  Home Medications   Current Outpatient Rx  Name  Route  Sig  Dispense  Refill  . AMLODIPINE BESYLATE 10 MG PO TABS   Oral   Take 10 mg by mouth at bedtime.         . ASPIRIN 81 MG PO TBEC  Oral   Take 81 mg by mouth daily.         Marland Kitchen CALCIUM ACETATE 667 MG PO CAPS   Oral   Take 2 capsules (1,334 mg total) by mouth 3 (three) times daily with meals.         Marland Kitchen LEVOTHYROXINE SODIUM 100 MCG PO TABS   Oral   Take 1 tablet (100 mcg total) by mouth daily before breakfast.   60 tablet   2   . LISINOPRIL 40 MG PO TABS   Oral   Take 40 mg by mouth at bedtime.         Marland Kitchen RENA-VITE PO TABS   Oral   Take 1 tablet by mouth at bedtime.           BP 237/46  Pulse 47  Temp 98.5 F (36.9 C) (Oral)  Resp 20  SpO2 97%  Physical Exam  Nursing note and vitals reviewed. Constitutional: She is oriented to person, place, and time. She appears well-developed and well-nourished.    HENT:  Head: Normocephalic and atraumatic.  Eyes: Conjunctivae normal and EOM are normal. Pupils are equal, round, and reactive to light.  Neck: Normal range of motion. Neck supple.  Cardiovascular: Normal rate.   Pulmonary/Chest: Effort normal.  Abdominal: Soft.  Musculoskeletal: Normal range of motion. She exhibits tenderness. She exhibits no edema.       Tenderness to the R buttocks over the sciatic nerve   Neurological: She is alert and oriented to person, place, and time. She has normal reflexes. She displays normal reflexes. No cranial nerve deficit. Coordination normal.       Arthritis to the R knee with limited ROM. +CMS to RLE.  Skin: Skin is warm and dry.  Psychiatric: She has a normal mood and affect.    ED Course  Procedures (including critical care time)  Labs Reviewed  BASIC METABOLIC PANEL - Abnormal; Notable for the following:    BUN 58 (*)     Creatinine, Ser 13.54 (*)     GFR calc non Af Amer 2 (*)     GFR calc Af Amer 3 (*)     All other components within normal limits  CBC - Abnormal; Notable for the following:    RBC 3.21 (*)     Hemoglobin 11.5 (*)     HCT 34.4 (*)     MCV 107.2 (*)     MCH 35.8 (*)     All other components within normal limits  URINALYSIS, ROUTINE W REFLEX MICROSCOPIC   No results found.   No diagnosis found.    MDM  R lower sciatica back pain better after percocet and ibuprofen. Ambulated in the ER with assistance.  She will use her walker when she gets home and follow up with her dialysis tomorrow.  K is 4.2 Monday.  Understands to return for worsening symptoms.  No fever, weakness or cauda equina symptoms.    Labs Reviewed  BASIC METABOLIC PANEL - Abnormal; Notable for the following:    BUN 58 (*)     Creatinine, Ser 13.54 (*)     GFR calc non Af Amer 2 (*)     GFR calc Af Amer 3 (*)     All other components within normal limits  CBC - Abnormal; Notable for the following:    RBC 3.21 (*)     Hemoglobin 11.5 (*)     HCT  34.4 (*)     MCV 107.2 (*)  MCH 35.8 (*)     All other components within normal limits  URINALYSIS, ROUTINE W REFLEX MICROSCOPIC          Remi Haggard, NP 02/04/12 [phone]

## 2012-02-04 NOTE — ED Notes (Signed)
Pt c/o right lower back pain with radiation down right leg x 1.5 weeks; pt denies fall or obvious injury; pt last dialysis was Tuesday and missed yesterday due to pain

## 2012-02-05 NOTE — ED Provider Notes (Signed)
Medical screening examination/treatment/procedure(s) were conducted as a shared visit with non-physician practitioner(s) and myself.  I personally evaluated the patient during the encounter  Pt with reproducible pain over sciatic area.  No abdominal pain or other symptoms to suggest AAA.  No neuro deficits.  Pain treated.  Pt to have dialysis tomorrow.  Rolan Bucco, MD 02/05/12 [phone]

## 2012-03-18 ENCOUNTER — Emergency Department (HOSPITAL_COMMUNITY)
Admission: EM | Admit: 2012-03-18 | Discharge: 2012-03-18 | Disposition: A | Discharge status: Home / Self Care | Payer: No Typology Code available for payment source | Attending: Emergency Medicine | Admitting: Emergency Medicine

## 2012-03-18 ENCOUNTER — Encounter (HOSPITAL_COMMUNITY): Payer: Self-pay

## 2012-03-18 DIAGNOSIS — I120 Hypertensive chronic kidney disease with stage 5 chronic kidney disease or end stage renal disease: Secondary | ICD-10-CM | POA: Insufficient documentation

## 2012-03-18 DIAGNOSIS — Z862 Personal history of diseases of the blood and blood-forming organs and certain disorders involving the immune mechanism: Secondary | ICD-10-CM | POA: Insufficient documentation

## 2012-03-18 DIAGNOSIS — Z8719 Personal history of other diseases of the digestive system: Secondary | ICD-10-CM | POA: Insufficient documentation

## 2012-03-18 DIAGNOSIS — E039 Hypothyroidism, unspecified: Secondary | ICD-10-CM | POA: Insufficient documentation

## 2012-03-18 DIAGNOSIS — Z8659 Personal history of other mental and behavioral disorders: Secondary | ICD-10-CM | POA: Insufficient documentation

## 2012-03-18 DIAGNOSIS — F17200 Nicotine dependence, unspecified, uncomplicated: Secondary | ICD-10-CM | POA: Insufficient documentation

## 2012-03-18 DIAGNOSIS — M5430 Sciatica, unspecified side: Secondary | ICD-10-CM

## 2012-03-18 DIAGNOSIS — M79609 Pain in unspecified limb: Secondary | ICD-10-CM | POA: Insufficient documentation

## 2012-03-18 DIAGNOSIS — Z992 Dependence on renal dialysis: Secondary | ICD-10-CM | POA: Insufficient documentation

## 2012-03-18 DIAGNOSIS — Z7982 Long term (current) use of aspirin: Secondary | ICD-10-CM | POA: Insufficient documentation

## 2012-03-18 DIAGNOSIS — I739 Peripheral vascular disease, unspecified: Secondary | ICD-10-CM | POA: Insufficient documentation

## 2012-03-18 DIAGNOSIS — Z8679 Personal history of other diseases of the circulatory system: Secondary | ICD-10-CM

## 2012-03-18 DIAGNOSIS — Z79899 Other long term (current) drug therapy: Secondary | ICD-10-CM | POA: Insufficient documentation

## 2012-03-18 DIAGNOSIS — Z9889 Other specified postprocedural states: Secondary | ICD-10-CM | POA: Insufficient documentation

## 2012-03-18 DIAGNOSIS — N186 End stage renal disease: Secondary | ICD-10-CM

## 2012-03-18 DIAGNOSIS — Z86711 Personal history of pulmonary embolism: Secondary | ICD-10-CM | POA: Insufficient documentation

## 2012-03-18 LAB — URINALYSIS, ROUTINE W REFLEX MICROSCOPIC
Bilirubin Urine: NEGATIVE
Nitrite: NEGATIVE
Specific Gravity, Urine: 1.013 (ref 1.005–1.030)
pH: 8.5 — ABNORMAL HIGH (ref 5.0–8.0)

## 2012-03-18 LAB — URINE MICROSCOPIC-ADD ON

## 2012-03-18 MED ORDER — HYDROCODONE-ACETAMINOPHEN 5-325 MG PO TABS
2.0000 | ORAL_TABLET | Freq: Once | ORAL | Status: AC
  Administered 2012-03-18: 2 via ORAL
  Filled 2012-03-18: qty 2

## 2012-03-18 MED ORDER — MELOXICAM 15 MG PO TABS: 15.0000 mg | ORAL_TABLET | Freq: Every day | ORAL | Status: DC

## 2012-03-18 MED ORDER — HYDROCODONE-ACETAMINOPHEN 5-325 MG PO TABS: 1.0000 | ORAL_TABLET | Freq: Four times a day (QID) | ORAL | Status: DC | PRN

## 2012-03-18 MED ORDER — POLYETHYLENE GLYCOL 3350 17 GM/SCOOP PO POWD: 17.0000 g | Freq: Two times a day (BID) | ORAL | Status: DC

## 2012-03-18 NOTE — ED Notes (Signed)
Son a bedside.

## 2012-03-18 NOTE — ED Notes (Signed)
Pt c/o right side flank pain going into right back of leg x 2 weeks. States that pain has gotten progressively worse. Has  Tried her pain pills but they are not helping

## 2012-03-18 NOTE — ED Notes (Signed)
Pt to BR via W/C

## 2012-03-18 NOTE — ED Notes (Signed)
Patient ambulated down hallway with mild dizziness and had increased pain with walking.  Stated 6 on pain scale with ambulating.  Patient assisted back to room and made comfortable in bed.  Call light in reach.

## 2012-03-18 NOTE — ED Provider Notes (Addendum)
79 year old female history of approximately 6 weeks of right lower back and buttocks pain. She states that extends down into her leg posteriorly, worse with ambulation and palpation. She denies urinary symptoms, retention or incontinence or dysuria. She also denies focal weakness or numbness.  On exam the patient is no tenderness to palpation over the right lower back, right buttock and with straight leg raise.moving all extremities x4, normal strength and sensation to all 4 extremities.   Pain medication, followup with surgeon. No indication for MRI at this time.   Medical screening examination/treatment/procedure(s) were conducted as a shared visit with non-physician practitioner(s) and myself.  I personally evaluated the patient during the encounter    Vida Roller, MD 03/18/12 1610  Vida Roller, MD 03/18/12 [phone]

## 2012-03-18 NOTE — ED Provider Notes (Signed)
History     CSN: [id number]  Arrival date & time 03/18/12  0605   First MD Initiated Contact with Patient 03/18/12 [phone]      Chief Complaint  Patient presents with  . Back Pain  . Leg Pain    (Consider location/radiation/quality/duration/timing/severity/associated sxs/prior treatment) The history is provided by the patient and medical records.    Nicole [name] is a 79 y.o. female  with a hx of HTN, ESRD, PVD, anemia, PE, AAA presents to the Emergency Department complaining of gradual, persistent, progressively worsening low back pain that radiates into the R hip and thigh onset 6 weeks ago.  Pt was seen here on 11/29 for similar pain and diagnosed with sciatica.  Pt states the pain medications helped but the pain has persisted.  She did not follow-up with anyone after the last visit.  She does not have any abdominal pain or difficulty urinating.  Associated symptoms include back pain, leg pain, gait disturbance 2/2 pain, decreased activity 2/2 pain.  Nothing makes it better and walking, stairs makes it worse.  Pt denies fever, chills, neck pain, saddle anesthesia, urinary frequency, urinary incontinence, urinary urgency, chest pain, shortness of breath, abdominal pain, nausea, vomiting, diarrhea.      Past Medical History  Diagnosis Date  . Hypertension   . ESRD (end stage renal disease)   . Peripheral vascular disease     revascularization appx. 14 years ago  . Hypothyroidism   . Hiatal hernia   . Iron deficiency anemia   . Osteoarthritis   . Depression with anxiety   . Pulmonary embolism   . AAA (abdominal aortic aneurysm) 1999    aortic byfem bypass    Past Surgical History  Procedure Date  . Abdominal surgery about 14 years ago    REVASCULARIZATION  . Ateriovenous graft 04/18/2009    LEFT FOREARM avg  . Av fistula placement, radiocephalic 01/09/10  . Avgg removal 12/02/09    SECONDARY TO INFECTION LT FOREARM  . Av fistula placement, radiocephalic 05/08/10    NONMATURED   . Arteriovenous graft placement 07/24/10    RIGHT FOREARM LOOP  . Dg av dialysis graft declot or 12/05/10, 6/17/123, 09/24/11, 01/26/12    CLOTTED GRAFT  . Angioplasty 12/21/11    of RUE AVG    No family history on file.  History  Substance Use Topics  . Smoking status: Current Every Day Smoker  . Smokeless tobacco: Not on file  . Alcohol Use:     OB History    Grav Para Term Preterm Abortions TAB SAB Ect Mult Living                  Review of Systems  Constitutional: Positive for activity change (2/2 pain). Negative for fever and fatigue.  HENT: Negative for neck pain and neck stiffness.   Respiratory: Negative for chest tightness and shortness of breath.   Cardiovascular: Negative for chest pain.  Gastrointestinal: Negative for nausea, vomiting, abdominal pain and diarrhea.  Genitourinary: Negative for dysuria, urgency, frequency and hematuria.  Musculoskeletal: Positive for back pain and gait problem ( 2/2 pain). Negative for joint swelling.  Skin: Negative for rash.  Neurological: Negative for weakness, light-headedness, numbness and headaches.  All other systems reviewed and are negative.    Allergies  Review of patient's allergies indicates no known allergies.  Home Medications   Current Outpatient Rx  Name  Route  Sig  Dispense  Refill  . AMLODIPINE BESYLATE 10 MG PO TABS  Oral   Take 10 mg by mouth at bedtime.         . ASPIRIN 81 MG PO TBEC   Oral   Take 81 mg by mouth daily.         Marland Kitchen CALCIUM ACETATE 667 MG PO CAPS   Oral   Take 2 capsules (1,334 mg total) by mouth 3 (three) times daily with meals.         . IBUPROFEN 400 MG PO TABS   Oral   Take 1 tablet (400 mg total) by mouth every 6 (six) hours as needed for pain.   30 tablet   0   . LEVOTHYROXINE SODIUM 100 MCG PO TABS   Oral   Take 1 tablet (100 mcg total) by mouth daily before breakfast.   60 tablet   2   . LISINOPRIL 40 MG PO TABS   Oral   Take 40 mg by mouth at bedtime.          Marland Kitchen RENA-VITE PO TABS   Oral   Take 1 tablet by mouth at bedtime.         . OXYCODONE-ACETAMINOPHEN 5-325 MG PO TABS   Oral   Take 1 tablet by mouth every 4 (four) hours as needed for pain.   10 tablet   0   . HYDROCODONE-ACETAMINOPHEN 5-325 MG PO TABS   Oral   Take 1 tablet by mouth every 6 (six) hours as needed for pain.   30 tablet   0   . MELOXICAM 15 MG PO TABS   Oral   Take 1 tablet (15 mg total) by mouth daily.   30 tablet   2   . POLYETHYLENE GLYCOL 3350 PO POWD   Oral   Take 17 g by mouth 2 (two) times daily. Until daily soft stools  OTC   255 g   0     BP 168/110  Pulse 68  Temp 98.3 F (36.8 C) (Oral)  Resp 20  SpO2 98%  Physical Exam  Nursing note and vitals reviewed. Constitutional: She is oriented to person, place, and time. She appears well-developed and well-nourished. No distress.  HENT:  Head: Normocephalic and atraumatic.  Mouth/Throat: Oropharynx is clear and moist. No oropharyngeal exudate.  Eyes: Conjunctivae normal are normal. Pupils are equal, round, and reactive to light.  Neck: Normal range of motion. Neck supple.       Full ROM without pain  Cardiovascular: Normal rate, regular rhythm and intact distal pulses.   Murmur heard.  Systolic murmur is present with a grade of 3/6  Pulses:      Radial pulses are 2+ on the right side, and 2+ on the left side.       Dorsalis pedis pulses are 2+ on the right side, and 2+ on the left side.       Posterior tibial pulses are 2+ on the right side, and 2+ on the left side.  Pulmonary/Chest: Effort normal and breath sounds normal. No respiratory distress. She has no wheezes. She has no rales. She exhibits no tenderness.  Abdominal: Soft. Bowel sounds are normal. She exhibits no distension and no mass. There is no tenderness. There is no rebound and no guarding.  Musculoskeletal:       Pain to palpation of the right L-spine paraspinal muscles, sacroiliac joint and focally over the R buttock  reproducing symptoms.   Positive straight leg raise  Lymphadenopathy:    She has no cervical adenopathy.  Neurological: She is alert and oriented to person, place, and time. She has normal strength and normal reflexes. No sensory deficit. Gait normal. GCS eye subscore is 4. GCS verbal subscore is 5. GCS motor subscore is 6.  Reflex Scores:      Tricep reflexes are 2+ on the right side and 2+ on the left side.      Bicep reflexes are 2+ on the right side and 2+ on the left side.      Brachioradialis reflexes are 2+ on the right side and 2+ on the left side.      Patellar reflexes are 2+ on the right side and 2+ on the left side.      Achilles reflexes are 2+ on the right side and 2+ on the left side.      Speech is clear and goal oriented, follows commands Normal strength in upper and lower extremities bilaterally including dorsiflexion and plantar flexion, strong and equal grip strength Sensation normal to light and sharp touch Moves extremities without ataxia, coordination intact Normal gait; Normal balance   Skin: Skin is warm and dry. No rash noted. She is not diaphoretic. No erythema.    ED Course  Procedures (including critical care time)  Labs Reviewed  URINALYSIS, ROUTINE W REFLEX MICROSCOPIC - Abnormal; Notable for the following:    APPearance CLOUDY (*)     pH 8.5 (*)     Hgb urine dipstick SMALL (*)     Protein, ur >300 (*)     Leukocytes, UA MODERATE (*)     All other components within normal limits  URINE MICROSCOPIC-ADD ON - Abnormal; Notable for the following:    Squamous Epithelial / LPF MANY (*)     Bacteria, UA FEW (*)     All other components within normal limits  URINE CULTURE   No results found.   1. Sciatica   2. H/O abdominal aortic aneurysm   3. ESRD (end stage renal disease) on dialysis       MDM  LENNI RECKNER presents with sciatic pain.  Considered other causes of back pain including AAA, cauda equina and other concerning causes of back pain  however, exam remains consistent with sciatica.  Normal neurological exam, no evidence of urinary incontinence or retention, pain is consistently reproducible. Pt with Hx of AAA, not palpable on exam, no concern for dissection at this time.   Patient can walk but states is painful.  No loss of bowel or bladder control.  No concern for cauda equina.  No fever, night sweats, weight loss, h/o cancer, IVDU.  Pain treated here in the department with adequate improvement and pt able to ambulate with steady gait afterwards. RICE protocol and pain medicine indicated and discussed with patient. I have also discussed reasons to return immediately to the ER.  Patient expresses understanding and agrees with plan.   1. Medications: vicodin, mobic, usual home medications 2. Treatment: rest, drink plenty of fluids, take medications as prescribed, rest, gentle stretching, use ice to help with pain 3. Follow Up: Please followup with your primary doctor for discussion of your diagnoses and further evaluation after today's visit; if you do not have a primary care doctor use the resource guide provided to find one; follow-up with neurosurgery for further evaluation of your back pain and further treatment options; return to the ER if you experience weakness, loss of function in your legs, bowel or bladder incontinence or inability to urinate.  Dahlia Client Guneet Delpino, PA-C 03/18/12 1630

## 2012-03-18 NOTE — ED Notes (Signed)
Pt son is at bedside not husband

## 2012-03-19 LAB — URINE CULTURE

## 2012-03-19 NOTE — ED Provider Notes (Signed)
Medical screening examination/treatment/procedure(s) were conducted as a shared visit with non-physician practitioner(s) and myself.  I personally evaluated the patient during the encounter  Please see my separate respective documentation pertaining to this patient encounter   Vida Roller, MD 03/19/12 [phone]

## 2012-03-20 ENCOUNTER — Inpatient Hospital Stay (HOSPITAL_COMMUNITY)
Admission: EM | Admit: 2012-03-20 | Discharge: 2012-03-28 | DRG: 477 | Disposition: A | Discharge status: Skilled Nursing Facility | Payer: No Typology Code available for payment source | Attending: Internal Medicine | Admitting: Internal Medicine

## 2012-03-20 ENCOUNTER — Emergency Department (HOSPITAL_COMMUNITY): Payer: No Typology Code available for payment source

## 2012-03-20 ENCOUNTER — Encounter (HOSPITAL_COMMUNITY): Payer: Self-pay

## 2012-03-20 DIAGNOSIS — I120 Hypertensive chronic kidney disease with stage 5 chronic kidney disease or end stage renal disease: Secondary | ICD-10-CM | POA: Diagnosis present

## 2012-03-20 DIAGNOSIS — K5900 Constipation, unspecified: Secondary | ICD-10-CM | POA: Diagnosis present

## 2012-03-20 DIAGNOSIS — R259 Unspecified abnormal involuntary movements: Secondary | ICD-10-CM | POA: Diagnosis present

## 2012-03-20 DIAGNOSIS — N186 End stage renal disease: Secondary | ICD-10-CM | POA: Diagnosis present

## 2012-03-20 DIAGNOSIS — E039 Hypothyroidism, unspecified: Secondary | ICD-10-CM | POA: Diagnosis present

## 2012-03-20 DIAGNOSIS — M549 Dorsalgia, unspecified: Secondary | ICD-10-CM | POA: Diagnosis present

## 2012-03-20 DIAGNOSIS — M869 Osteomyelitis, unspecified: Secondary | ICD-10-CM | POA: Diagnosis present

## 2012-03-20 DIAGNOSIS — D631 Anemia in chronic kidney disease: Secondary | ICD-10-CM | POA: Diagnosis present

## 2012-03-20 DIAGNOSIS — I739 Peripheral vascular disease, unspecified: Secondary | ICD-10-CM | POA: Diagnosis present

## 2012-03-20 DIAGNOSIS — J811 Chronic pulmonary edema: Secondary | ICD-10-CM

## 2012-03-20 DIAGNOSIS — I10 Essential (primary) hypertension: Secondary | ICD-10-CM | POA: Diagnosis present

## 2012-03-20 DIAGNOSIS — M519 Unspecified thoracic, thoracolumbar and lumbosacral intervertebral disc disorder: Principal | ICD-10-CM | POA: Diagnosis present

## 2012-03-20 DIAGNOSIS — Z992 Dependence on renal dialysis: Secondary | ICD-10-CM

## 2012-03-20 LAB — POCT I-STAT, CHEM 8
BUN: 61 mg/dL — ABNORMAL HIGH (ref 6–23)
Chloride: 98 mEq/L (ref 96–112)
Sodium: 137 mEq/L (ref 135–145)

## 2012-03-20 MED ORDER — HYDROCODONE-ACETAMINOPHEN 5-325 MG PO TABS
1.0000 | ORAL_TABLET | Freq: Once | ORAL | Status: AC
  Administered 2012-03-20: 1 via ORAL
  Filled 2012-03-20: qty 1

## 2012-03-20 MED ORDER — AMLODIPINE BESYLATE 10 MG PO TABS
10.0000 mg | ORAL_TABLET | Freq: Once | ORAL | Status: AC
  Administered 2012-03-20: 10 mg via ORAL
  Filled 2012-03-20 (×2): qty 1

## 2012-03-20 MED ORDER — LISINOPRIL 20 MG PO TABS
40.0000 mg | ORAL_TABLET | Freq: Once | ORAL | Status: AC
  Administered 2012-03-20: 40 mg via ORAL
  Filled 2012-03-20: qty 2

## 2012-03-20 NOTE — ED Provider Notes (Signed)
History   This chart was scribed for Nicole Sou, MD by Nicole [name], ED Scribe. This patient was seen in room TR11C/TR11C and the patient's care was started at 7:46 PM    CSN: [id number]  Arrival date & time 03/20/12  1716   First MD Initiated Contact with Patient 03/20/12 1945      Chief Complaint  Patient presents with  . Back Pain  . Leg Pain    The history is provided by the patient. No language interpreter was used.   Nicole [name] is a 79 y.o. female dialysis patient with h/o HTN, PVD, and kidney disease who presents to the Emergency Department complaining of 2 weeks of constant, gradual onset, sharp, mild right side pain that radiates down right buttocks into right knee with no reported injuries, falls, or traumas as cause.  Pain worsened by ambulation and has improved with remaining still.  Pt denies incontinence, fever, abdominal pain.  Pt reports she has not been to dialysis since 01/02.  Pt seen here 01/11 for same complaint and prescribed Vicodin but states she has only been taking one pill each day with no relief to pain.  Pt is a former smoker and denies alcohol use.   Past Medical History  Diagnosis Date  . Hypertension   . ESRD (end stage renal disease)   . Peripheral vascular disease     revascularization appx. 14 years ago  . Hypothyroidism   . Hiatal hernia   . Iron deficiency anemia   . Osteoarthritis   . Depression with anxiety   . Pulmonary embolism   . AAA (abdominal aortic aneurysm) 1999    aortic byfem bypass    Past Surgical History  Procedure Date  . Abdominal surgery about 14 years ago    REVASCULARIZATION  . Ateriovenous graft 04/18/2009    LEFT FOREARM avg  . Av fistula placement, radiocephalic 01/09/10  . Avgg removal 12/02/09    SECONDARY TO INFECTION LT FOREARM  . Av fistula placement, radiocephalic 05/08/10    NONMATURED  . Arteriovenous graft placement 07/24/10    RIGHT FOREARM LOOP  . Dg av dialysis graft declot or 12/05/10, 6/17/123,  09/24/11, 01/26/12    CLOTTED GRAFT  . Angioplasty 12/21/11    of RUE AVG    No family history on file.  History  Substance Use Topics  . Smoking status: Former Games developer  . Smokeless tobacco: Not on file  . Alcohol Use: No    No OB history provided.   Review of Systems  Constitutional: Negative.  Negative for fever.  HENT: Negative.   Respiratory: Negative.   Cardiovascular: Negative.   Gastrointestinal: Negative.  Negative for abdominal pain.  Musculoskeletal: Negative.        Back pain, leg pain  Skin: Negative.   Neurological: Negative.   Hematological: Negative.   Psychiatric/Behavioral: Negative.     Allergies  Review of patient's allergies indicates no known allergies.  Home Medications   Current Outpatient Rx  Name  Route  Sig  Dispense  Refill  . AMLODIPINE BESYLATE 10 MG PO TABS   Oral   Take 10 mg by mouth at bedtime.         . ASPIRIN 81 MG PO TBEC   Oral   Take 81 mg by mouth daily.         Marland Kitchen CALCIUM ACETATE 667 MG PO CAPS   Oral   Take 2 capsules (1,334 mg total) by mouth 3 (three) times daily with  meals.         Marland Kitchen HYDROCODONE-ACETAMINOPHEN 5-325 MG PO TABS   Oral   Take 1 tablet by mouth every 6 (six) hours as needed for pain.   30 tablet   0   . IBUPROFEN 400 MG PO TABS   Oral   Take 1 tablet (400 mg total) by mouth every 6 (six) hours as needed for pain.   30 tablet   0   . LEVOTHYROXINE SODIUM 100 MCG PO TABS   Oral   Take 1 tablet (100 mcg total) by mouth daily before breakfast.   60 tablet   2   . LISINOPRIL 40 MG PO TABS   Oral   Take 40 mg by mouth at bedtime.         . MELOXICAM 15 MG PO TABS   Oral   Take 1 tablet (15 mg total) by mouth daily.   30 tablet   2   . RENA-VITE PO TABS   Oral   Take 1 tablet by mouth at bedtime.         Marland Kitchen POLYETHYLENE GLYCOL 3350 PO POWD   Oral   Take 17 g by mouth 2 (two) times daily. Until daily soft stools  OTC   255 g   0     BP 232/68  Pulse 70  Temp 98.9 F  (37.2 C) (Oral)  Resp 20  SpO2 100%  Physical Exam  Nursing note and vitals reviewed. Constitutional: She appears well-developed and well-nourished.  HENT:  Head: Normocephalic and atraumatic.  Eyes: Conjunctivae normal are normal. Pupils are equal, round, and reactive to light.  Neck: Neck supple. No tracheal deviation present. No thyromegaly present.  Cardiovascular: Normal rate and regular rhythm.   No murmur heard. Pulmonary/Chest: Effort normal and breath sounds normal.  Abdominal: Soft. Bowel sounds are normal. She exhibits no distension and no mass. There is no tenderness.  Musculoskeletal: Normal range of motion. She exhibits no edema and no tenderness.       Left upper extremity with dialysis fistula with good thrill  Neurological: She is alert. Coordination normal.  Skin: Skin is warm and dry. No rash noted.  Psychiatric: She has a normal mood and affect.    ED Course  Procedures (including critical care time) DIAGNOSTIC STUDIES: Oxygen Saturation is 100% on room air, normal by my interpretation.    COORDINATION OF CARE: 7:52 PM- Patient informed of clinical course, understands medical decision-making process, and agrees with plan.  Informed pt that one Vicodin daily is not enough to relieve pain.  Results for orders placed during the hospital encounter of 03/20/12  POCT I-STAT, CHEM 8      Component Value Range   Sodium 137  135 - 145 mEq/L   Potassium 4.2  3.5 - 5.1 mEq/L   Chloride 98  96 - 112 mEq/L   BUN 61 (*) 6 - 23 mg/dL   Creatinine, Ser 46.96 (*) 0.50 - 1.10 mg/dL   Glucose, Bld 86  70 - 99 mg/dL   Calcium, Ion 2.95 (*) 1.13 - 1.30 mmol/L   TCO2 27  0 - 100 mmol/L   Hemoglobin 9.9 (*) 12.0 - 15.0 g/dL   HCT 28.4 (*) 13.2 - 44.0 %    Dg Lumbar Spine Complete  03/20/2012  *RADIOLOGY REPORT*  Clinical Data: Back and leg pain.  LUMBAR SPINE - COMPLETE 4+ VIEW  Comparison: None.  Findings: There is irregular destruction of the endplates about the L2-3  interspace.  There is some sclerosis in the adjacent vertebral bodies.  There is some loss of height of L3 anteriorly.  There is marked narrowing of the L4-5 interspace.  Probable L4 pars defects allow approximately 12 mm anterolisthesis of L4 on L5. Vascular clips in the mid abdomen and over the left hip. Patchy aortic calcifications.  IMPRESSION:  1.  Probable diskitis/osteomyelitis L2-3.  Consider MRI lumbar spine with contrast for further characterization. 2.  Advanced degenerative disc disease L4-5 with grade II anterolisthesis related to L4 pars defects.   Original Report Authenticated By: D. Andria Rhein, MD    Results for orders placed during the hospital encounter of 03/20/12  POCT I-STAT, CHEM 8      Component Value Range   Sodium 137  135 - 145 mEq/L   Potassium 4.2  3.5 - 5.1 mEq/L   Chloride 98  96 - 112 mEq/L   BUN 61 (*) 6 - 23 mg/dL   Creatinine, Ser 56.21 (*) 0.50 - 1.10 mg/dL   Glucose, Bld 86  70 - 99 mg/dL   Calcium, Ion 3.08 (*) 1.13 - 1.30 mmol/L   TCO2 27  0 - 100 mmol/L   Hemoglobin 9.9 (*) 12.0 - 15.0 g/dL   HCT 65.7 (*) 84.6 - 96.2 %   Dg Lumbar Spine Complete  03/20/2012  *RADIOLOGY REPORT*  Clinical Data: Back and leg pain.  LUMBAR SPINE - COMPLETE 4+ VIEW  Comparison: None.  Findings: There is irregular destruction of the endplates about the L2-3 interspace.  There is some sclerosis in the adjacent vertebral bodies.  There is some loss of height of L3 anteriorly.  There is marked narrowing of the L4-5 interspace.  Probable L4 pars defects allow approximately 12 mm anterolisthesis of L4 on L5. Vascular clips in the mid abdomen and over the left hip. Patchy aortic calcifications.  IMPRESSION:  1.  Probable diskitis/osteomyelitis L2-3.  Consider MRI lumbar spine with contrast for further characterization. 2.  Advanced degenerative disc disease L4-5 with grade II anterolisthesis related to L4 pars defects.   Original Report Authenticated By: D. Andria Rhein, MD   Plain xrays  reviewed by me   No diagnosis found.  9:35 PM and improved after treatment with Norco  115 am resting comfortably aler gcs 15 MDM  Awaiting MRI result'  Pt's bp elevated, but has been noncompliant with her medications and is asymptomatic . No signs of hypertensive emergency I stresed to pt she must go to dialysis tomorrow. Pt signed out to Dr. Dierdre Highman at 115 am  Dx #1 lumbar radiculopathy #2 hypertension #3 medication non compliance   I personally performed the services described in this documentation, which was scribed in my presence. The recorded information has been reviewed and is accurate.         Nicole Sou, MD 03/21/12 [phone]

## 2012-03-20 NOTE — ED Notes (Signed)
Pt c/o back pain on the right side radiating down her right leg. No known injury

## 2012-03-20 NOTE — ED Notes (Signed)
Dr. Ethelda Chick made aware of pts.

## 2012-03-20 NOTE — ED Notes (Addendum)
Pt states onset of pain began two weeks ago, shooting pain from lower back to legs affects her walking, A&Ox4, uses walker, nad.

## 2012-03-21 ENCOUNTER — Emergency Department (HOSPITAL_COMMUNITY): Payer: No Typology Code available for payment source

## 2012-03-21 DIAGNOSIS — N186 End stage renal disease: Secondary | ICD-10-CM

## 2012-03-21 DIAGNOSIS — I739 Peripheral vascular disease, unspecified: Secondary | ICD-10-CM

## 2012-03-21 DIAGNOSIS — N039 Chronic nephritic syndrome with unspecified morphologic changes: Secondary | ICD-10-CM

## 2012-03-21 DIAGNOSIS — Z992 Dependence on renal dialysis: Secondary | ICD-10-CM | POA: Diagnosis present

## 2012-03-21 DIAGNOSIS — M519 Unspecified thoracic, thoracolumbar and lumbosacral intervertebral disc disorder: Principal | ICD-10-CM

## 2012-03-21 DIAGNOSIS — R6889 Other general symptoms and signs: Secondary | ICD-10-CM

## 2012-03-21 DIAGNOSIS — E039 Hypothyroidism, unspecified: Secondary | ICD-10-CM

## 2012-03-21 DIAGNOSIS — I10 Essential (primary) hypertension: Secondary | ICD-10-CM | POA: Diagnosis present

## 2012-03-21 DIAGNOSIS — G253 Myoclonus: Secondary | ICD-10-CM

## 2012-03-21 DIAGNOSIS — M869 Osteomyelitis, unspecified: Secondary | ICD-10-CM | POA: Diagnosis present

## 2012-03-21 DIAGNOSIS — R259 Unspecified abnormal involuntary movements: Secondary | ICD-10-CM | POA: Diagnosis present

## 2012-03-21 DIAGNOSIS — M549 Dorsalgia, unspecified: Secondary | ICD-10-CM

## 2012-03-21 LAB — HEPATIC FUNCTION PANEL
ALT: 5 U/L (ref 0–35)
Alkaline Phosphatase: 72 U/L (ref 39–117)
Bilirubin, Direct: 0.2 mg/dL (ref 0.0–0.3)
Indirect Bilirubin: 0.1 mg/dL — ABNORMAL LOW (ref 0.3–0.9)

## 2012-03-21 LAB — CBC
Hemoglobin: 9.4 g/dL — ABNORMAL LOW (ref 12.0–15.0)
MCHC: 34.1 g/dL (ref 30.0–36.0)
RDW: 14.7 % (ref 11.5–15.5)
WBC: 8.4 10*3/uL (ref 4.0–10.5)

## 2012-03-21 LAB — RENAL FUNCTION PANEL
Albumin: 2.9 g/dL — ABNORMAL LOW (ref 3.5–5.2)
Calcium: 8.5 mg/dL (ref 8.4–10.5)
Creatinine, Ser: 18.13 mg/dL — ABNORMAL HIGH (ref 0.50–1.10)
GFR calc Af Amer: 2 mL/min — ABNORMAL LOW (ref >90)
GFR calc non Af Amer: 2 mL/min — ABNORMAL LOW (ref >90)
Phosphorus: 4.9 mg/dL — ABNORMAL HIGH (ref 2.3–4.6)
Sodium: 135 mEq/L (ref 135–145)

## 2012-03-21 LAB — SEDIMENTATION RATE: Sed Rate: 38 mm/hr — ABNORMAL HIGH (ref 0–22)

## 2012-03-21 MED ORDER — HYDRALAZINE HCL 20 MG/ML IJ SOLN
5.0000 mg | INTRAMUSCULAR | Status: DC | PRN
  Administered 2012-03-22 (×3): 5 mg via INTRAVENOUS
  Filled 2012-03-21 (×3): qty 0.25

## 2012-03-21 MED ORDER — HYDROMORPHONE HCL PF 1 MG/ML IJ SOLN: 1.0000 mg | INTRAMUSCULAR | Status: DC | PRN

## 2012-03-21 MED ORDER — ALUM & MAG HYDROXIDE-SIMETH 200-200-20 MG/5ML PO SUSP: 30.0000 mL | Freq: Four times a day (QID) | ORAL | Status: DC | PRN

## 2012-03-21 MED ORDER — ONDANSETRON HCL 4 MG PO TABS: 4.0000 mg | ORAL_TABLET | Freq: Four times a day (QID) | ORAL | Status: DC | PRN

## 2012-03-21 MED ORDER — ACETAMINOPHEN 325 MG PO TABS
650.0000 mg | ORAL_TABLET | Freq: Four times a day (QID) | ORAL | Status: DC | PRN
  Administered 2012-03-24 – 2012-03-25 (×3): 650 mg via ORAL
  Filled 2012-03-21 (×3): qty 2

## 2012-03-21 MED ORDER — LEVOTHYROXINE SODIUM 100 MCG PO TABS
100.0000 ug | ORAL_TABLET | Freq: Every day | ORAL | Status: DC
  Administered 2012-03-21 – 2012-03-22 (×2): 100 ug via ORAL
  Filled 2012-03-21 (×3): qty 1

## 2012-03-21 MED ORDER — HALOPERIDOL LACTATE 5 MG/ML IJ SOLN
0.5000 mg | Freq: Four times a day (QID) | INTRAMUSCULAR | Status: DC | PRN
  Filled 2012-03-21: qty 0.1

## 2012-03-21 MED ORDER — BOOST / RESOURCE BREEZE PO LIQD
1.0000 | Freq: Three times a day (TID) | ORAL | Status: DC
  Administered 2012-03-23 – 2012-03-28 (×12): 1 via ORAL
  Filled 2012-03-21: qty 1

## 2012-03-21 MED ORDER — FLEET ENEMA 7-19 GM/118ML RE ENEM
1.0000 | ENEMA | Freq: Every day | RECTAL | Status: DC | PRN
  Filled 2012-03-21: qty 1

## 2012-03-21 MED ORDER — ONDANSETRON HCL 4 MG/2ML IJ SOLN: 4.0000 mg | Freq: Four times a day (QID) | INTRAMUSCULAR | Status: DC | PRN

## 2012-03-21 MED ORDER — AMLODIPINE BESYLATE 10 MG PO TABS
10.0000 mg | ORAL_TABLET | Freq: Every day | ORAL | Status: DC
  Administered 2012-03-22 – 2012-03-27 (×6): 10 mg via ORAL
  Filled 2012-03-21 (×7): qty 1

## 2012-03-21 MED ORDER — RENA-VITE PO TABS
1.0000 | ORAL_TABLET | Freq: Every day | ORAL | Status: DC
  Administered 2012-03-21 – 2012-03-27 (×7): 1 via ORAL
  Filled 2012-03-21 (×8): qty 1

## 2012-03-21 MED ORDER — HEPARIN SODIUM (PORCINE) 5000 UNIT/ML IJ SOLN
5000.0000 [IU] | Freq: Three times a day (TID) | INTRAMUSCULAR | Status: DC
  Administered 2012-03-22 – 2012-03-26 (×7): 5000 [IU] via SUBCUTANEOUS
  Filled 2012-03-21 (×25): qty 1

## 2012-03-21 MED ORDER — VALPROIC ACID 250 MG PO CAPS: 250.0000 mg | ORAL_CAPSULE | Freq: Three times a day (TID) | ORAL | Status: DC

## 2012-03-21 MED ORDER — HYDROCODONE-ACETAMINOPHEN 5-325 MG PO TABS
1.0000 | ORAL_TABLET | Freq: Four times a day (QID) | ORAL | Status: DC | PRN
  Administered 2012-03-21 – 2012-03-28 (×7): 1 via ORAL
  Filled 2012-03-21 (×6): qty 1

## 2012-03-21 MED ORDER — BISACODYL 10 MG RE SUPP: 10.0000 mg | Freq: Every day | RECTAL | Status: DC | PRN

## 2012-03-21 MED ORDER — DARBEPOETIN ALFA-POLYSORBATE 25 MCG/0.42ML IJ SOLN
25.0000 ug | INTRAMUSCULAR | Status: DC
Start: 2012-03-21 — End: 2012-03-25

## 2012-03-21 MED ORDER — SODIUM CHLORIDE 0.9 % IJ SOLN
3.0000 mL | Freq: Two times a day (BID) | INTRAMUSCULAR | Status: DC
  Administered 2012-03-21 – 2012-03-27 (×13): 3 mL via INTRAVENOUS

## 2012-03-21 MED ORDER — ASPIRIN 81 MG PO TBEC
81.0000 mg | DELAYED_RELEASE_TABLET | Freq: Every day | ORAL | Status: DC
  Administered 2012-03-21 – 2012-03-28 (×8): 81 mg via ORAL
  Filled 2012-03-21 (×9): qty 1

## 2012-03-21 MED ORDER — VALPROIC ACID 250 MG PO CAPS
250.0000 mg | ORAL_CAPSULE | Freq: Three times a day (TID) | ORAL | Status: DC
  Filled 2012-03-21 (×4): qty 1

## 2012-03-21 MED ORDER — LABETALOL HCL 5 MG/ML IV SOLN
20.0000 mg | Freq: Once | INTRAVENOUS | Status: AC
  Administered 2012-03-21: 20 mg via INTRAVENOUS
  Filled 2012-03-21: qty 4

## 2012-03-21 MED ORDER — LISINOPRIL 40 MG PO TABS
40.0000 mg | ORAL_TABLET | Freq: Every day | ORAL | Status: DC
  Administered 2012-03-21 – 2012-03-27 (×7): 40 mg via ORAL
  Filled 2012-03-21 (×8): qty 1

## 2012-03-21 MED ORDER — ACETAMINOPHEN 650 MG RE SUPP: 650.0000 mg | Freq: Four times a day (QID) | RECTAL | Status: DC | PRN

## 2012-03-21 MED ORDER — MELOXICAM 15 MG PO TABS
15.0000 mg | ORAL_TABLET | Freq: Every day | ORAL | Status: DC
  Administered 2012-03-21 – 2012-03-28 (×8): 15 mg via ORAL
  Filled 2012-03-21 (×8): qty 1

## 2012-03-21 MED ORDER — DARBEPOETIN ALFA-POLYSORBATE 25 MCG/0.42ML IJ SOLN
INTRAMUSCULAR | Status: AC
  Filled 2012-03-21: qty 0.42

## 2012-03-21 MED ORDER — POLYETHYLENE GLYCOL 3350 17 G PO PACK
17.0000 g | PACK | Freq: Every day | ORAL | Status: DC
  Administered 2012-03-22: 17 g via ORAL
  Filled 2012-03-21 (×3): qty 1

## 2012-03-21 MED ORDER — LORAZEPAM 2 MG/ML IJ SOLN
1.0000 mg | Freq: Once | INTRAMUSCULAR | Status: AC
  Administered 2012-03-21: 1 mg via INTRAVENOUS
  Filled 2012-03-21: qty 1

## 2012-03-21 MED ORDER — DEXTROSE 5 % IV SOLN
0.5000 mg/min | INTRAVENOUS | Status: DC
  Administered 2012-03-21: 0.5 mg/min via INTRAVENOUS
  Filled 2012-03-21: qty 100

## 2012-03-21 MED ORDER — DOCUSATE SODIUM 100 MG PO CAPS
100.0000 mg | ORAL_CAPSULE | Freq: Two times a day (BID) | ORAL | Status: DC
  Administered 2012-03-21 – 2012-03-28 (×15): 100 mg via ORAL
  Filled 2012-03-21 (×16): qty 1

## 2012-03-21 MED ORDER — IBUPROFEN 400 MG PO TABS
400.0000 mg | ORAL_TABLET | Freq: Four times a day (QID) | ORAL | Status: DC | PRN
  Filled 2012-03-21: qty 1

## 2012-03-21 MED ORDER — CALCIUM ACETATE 667 MG PO CAPS
1334.0000 mg | ORAL_CAPSULE | Freq: Three times a day (TID) | ORAL | Status: DC
  Administered 2012-03-21 – 2012-03-28 (×15): 1334 mg via ORAL
  Filled 2012-03-21 (×24): qty 2

## 2012-03-21 MED ORDER — POLYETHYLENE GLYCOL 3350 17 GM/SCOOP PO POWD: 17.0000 g | Freq: Two times a day (BID) | ORAL | Status: DC

## 2012-03-21 NOTE — Progress Notes (Signed)
Pt arrived to the unit via stretcher from the ED. Pt alert and oriented x4. Ambulatory x 1 assist. No skin breakdown noted. Pt VS stable. Oriented to staff and unit. No distress noted. Will cont to monitor.

## 2012-03-21 NOTE — ED Provider Notes (Signed)
MRI PRELIM REVIEWED Prob discitis/ Osteo L2-L3, final read pending.  MED consult for admit and recs. 2:44 AM d/w Dr Isidoro Donning, wants to speak with renal and NSG and then determine if PT can get dialysis this am.  Will hold off on ABx and BP management at this time, other than home meds.    Sunnie Nielsen, MD 03/21/12 [phone]

## 2012-03-21 NOTE — H&P (Signed)
History and Physical       Hospital Admission Note Date: 03/21/2012  Patient name: Nicole [name] Medical record number: [id number] Date of birth: 10/02/1932 Age: 79 y.o. Gender: female PCP: COLADONATO,JOSEPH A, MD    Chief Complaint:  Back pain   HPI: Patient is a 79 year old female with end-stage renal disease on hemodialysis TTS, peripherovascular disease, hypertension who presented to the ER today with 2 weeks of constant, sharp pain in her lower back mostly on the right side and radiates down to the right buttocks into the right leg. She denies any injuries, fall or any trauma. Patient states that the pain was worsened on ambulation. Patient otherwise denied any urinary symptoms. She was seen in the ED on 03/18/2012 for the same and was discharged home. Patient says that she was taking Vicodin at home with no improvement in the pain. ED workup also showed uncontrolled malignant hypertension with highest BP of 286/74. MRI of the lumbar spine was done, final results are still pending but preliminary results shows probable discitis or osteomyelitis in the L2-L3 region.    Review of Systems:  Constitutional: Denies fever, chills, diaphoresis, appetite change and + fatigue .  HEENT: Denies photophobia, eye pain, redness, hearing loss, ear pain, congestion, sore throat, rhinorrhea, sneezing, mouth sores, trouble swallowing, neck pain, neck stiffness and tinnitus.   Respiratory: Denies SOB, DOE, cough, chest tightness,  and wheezing.   Cardiovascular: Denies chest pain, palpitations and leg swelling.  Gastrointestinal: Denies nausea, vomiting, abdominal pain, diarrhea, constipation, blood in stool and abdominal distention.  Genitourinary: Denies dysuria, urgency, frequency, hematuria, flank pain and difficulty urinating.  Musculoskeletal: Please see history of present illness  Skin: Denies pallor, rash and wound.  Neurological: Denies  dizziness, seizures, syncope, weakness, light-headedness, numbness and headaches.  Hematological: Denies adenopathy. Easy bruising, personal or family bleeding history  Psychiatric/Behavioral: Denies suicidal ideation, mood changes, confusion, nervousness, sleep disturbance and agitation  Past Medical History: Past Medical History  Diagnosis Date  . Hypertension   . ESRD (end stage renal disease)   . Peripheral vascular disease     revascularization appx. 14 years ago  . Hypothyroidism   . Hiatal hernia   . Iron deficiency anemia   . Osteoarthritis   . Depression with anxiety   . Pulmonary embolism   . AAA (abdominal aortic aneurysm) 1999    aortic byfem bypass   Past Surgical History  Procedure Date  . Abdominal surgery about 14 years ago    REVASCULARIZATION  . Ateriovenous graft 04/18/2009    LEFT FOREARM avg  . Av fistula placement, radiocephalic 01/09/10  . Avgg removal 12/02/09    SECONDARY TO INFECTION LT FOREARM  . Av fistula placement, radiocephalic 05/08/10    NONMATURED  . Arteriovenous graft placement 07/24/10    RIGHT FOREARM LOOP  . Dg av dialysis graft declot or 12/05/10, 6/17/123, 09/24/11, 01/26/12    CLOTTED GRAFT  . Angioplasty 12/21/11    of RUE AVG    Medications: Prior to Admission medications   Medication Sig Start Date End Date Taking? Authorizing Provider  amLODipine (NORVASC) 10 MG tablet Take 10 mg by mouth at bedtime.   Yes Historical Provider, MD  aspirin 81 MG EC tablet Take 81 mg by mouth daily. 06/15/11 06/14/12 Yes Maree Krabbe, MD  calcium acetate (PHOSLO) 667 MG capsule Take 2 capsules (1,334 mg total) by mouth 3 (three) times daily with meals. 06/15/11 06/14/12 Yes Maree Krabbe, MD  HYDROcodone-acetaminophen (NORCO/VICODIN) 5-325 MG per  tablet Take 1 tablet by mouth every 6 (six) hours as needed for pain. 03/18/12  Yes Hannah Muthersbaugh, PA-C  ibuprofen (ADVIL,MOTRIN) 400 MG tablet Take 1 tablet (400 mg total) by mouth every 6 (six) hours as  needed for pain. 02/04/12  Yes Remi Haggard, NP  levothyroxine (SYNTHROID, LEVOTHROID) 100 MCG tablet Take 1 tablet (100 mcg total) by mouth daily before breakfast. 06/16/11 06/15/12 Yes Maree Krabbe, MD  lisinopril (PRINIVIL,ZESTRIL) 40 MG tablet Take 40 mg by mouth at bedtime.   Yes Historical Provider, MD  meloxicam (MOBIC) 15 MG tablet Take 1 tablet (15 mg total) by mouth daily. 03/18/12  Yes Hannah Muthersbaugh, PA-C  multivitamin (RENA-VIT) TABS tablet Take 1 tablet by mouth at bedtime. 06/15/11  Yes Maree Krabbe, MD  polyethylene glycol powder (GLYCOLAX/MIRALAX) powder Take 17 g by mouth 2 (two) times daily. Until daily soft stools  OTC 03/18/12  Yes Hannah Muthersbaugh, PA-C    Allergies:  No Known Allergies  Social History:  reports that she has quit smoking. She does not have any smokeless tobacco history on file. She reports that she does not drink alcohol or use illicit drugs.  Family History: No family history on file.  Physical Exam: Blood pressure 215/68, pulse 70, temperature 98.9 F (37.2 C), temperature source Oral, resp. rate 18, SpO2 98.00%. General: Alert, awake, oriented x3, in no acute distress. HEENT: normocephalic, atraumatic, anicteric sclera, pink conjunctiva, pupils equal and reactive to light and accomodation, oropharynx clear Neck: supple, no masses or lymphadenopathy, no goiter, no bruits  Heart: Regular rate and rhythm, without murmurs, rubs or gallops. Lungs: Clear to auscultation bilaterally, no wheezing, rales or rhonchi. Abdomen: Soft, nontender, nondistended, positive bowel sounds, no masses. Extremities: No clubbing, cyanosis or edema with positive pedal pulses. Neuro: Grossly intact, no focal neurological deficits, strength 5/5 upper and lower extremities bilaterally Psych: alert and oriented x 3, normal mood and affect Skin: no rashes or lesions, warm and dry   LABS on Admission:  Basic Metabolic Panel:  Lab 03/20/12 0865  NA 137  K  4.2  CL 98  CO2 --  GLUCOSE 86  BUN 61*  CREATININE 14.60*  CALCIUM --  MG --  PHOS --   CBC:  Lab 03/21/12 0115 03/20/12 2114  WBC 8.4 --  NEUTROABS -- --  HGB 9.4* 9.9*  HCT 27.6* 29.0*  MCV 96.5 --  PLT 179 --     Radiological Exams on Admission: Dg Lumbar Spine Complete  03/20/2012  *RADIOLOGY REPORT*  Clinical Data: Back and leg pain.  LUMBAR SPINE - COMPLETE 4+ VIEW  Comparison: None.  Findings: There is irregular destruction of the endplates about the L2-3 interspace.  There is some sclerosis in the adjacent vertebral bodies.  There is some loss of height of L3 anteriorly.  There is marked narrowing of the L4-5 interspace.  Probable L4 pars defects allow approximately 12 mm anterolisthesis of L4 on L5. Vascular clips in the mid abdomen and over the left hip. Patchy aortic calcifications.  IMPRESSION:  1.  Probable diskitis/osteomyelitis L2-3.  Consider MRI lumbar spine with contrast for further characterization. 2.  Advanced degenerative disc disease L4-5 with grade II anterolisthesis related to L4 pars defects.   Original Report Authenticated By: D. Andria Rhein, MD     Assessment/Plan Principal Problem:  *Acute back pain/lumbar radiculopathy possibly secondary to probable discitis/osteomyelitis in the L2-3 region (final report of MRI still pending)  - admit to step down due to malignant hypertension, obtain  blood cultures x2, ESR, CRP - Pain control, patient will need bone biopsy/culture for definitive diagnosis and treatment - Renal, infectious disease and orthospine consultations will be obtained, patient is afebrile, no leukocytosis.  Patient will need to be started on IV antibiotics, cultures will determine the duration of antibiotics.If blood cultures are positive, patient will need to be ruled out for endocarditis and to identify the source.   Active Problems:  HTN (hypertension), malignant - Will start patient on labetalol drip, once BP is better controlled, place  on oral antihypertensives   Hypothyroidism: Continue Synthroid    Anemia in chronic kidney disease(285.21): Per renal   ESRD (end stage renal disease) on hemodialysis TTS  - Patient needs hemodialysis today per her schedule, will consult renal service   DVT prophylaxis:  heparin subcutaneous   CODE STATUS:Full code Further plan will depend as patient's clinical course evolves and further radiologic and laboratory data become available.   Time Spent on Admission: 1 hour  RAI,RIPUDEEP M.D. [facility] 03/21/2012, 3:33 AM Pager: [phone]  If 7PM-7AM, please contact night-coverage [email or web address] Password [email or web address]

## 2012-03-21 NOTE — ED Notes (Signed)
Pt up to br states needs something  To have bm, neuro here to see pt for  Tremors. Pt aaox4 refused her phos lo because she  Did not eat any lunch she states  Pt made comfortable call light in reach

## 2012-03-21 NOTE — Progress Notes (Signed)
[facility] Progress Note  TEAM 1 - Stepdown/ICU TEAM   Nicole [name] ZOX:[id number] DOB: 1932/10/19 DOA: 03/20/2012 PCP: [name], MD  Brief narrative: 79 year old female patient with chronic kidney disease who presented to the ER because of 2 weeks of constant sharp pain in her lower back primarily on the right side with radiculopathy to the right leg and buttock. Denied any recent injuries fall or trauma. Increased pain on ambulation. Was evaluated in the emergency department on 03/18/2012 for the same but was discharged home. Was taking Vicodin at home without improvement in the pain. In the ER patient had uncontrolled malignant hypertension with highest blood pressure to 86/74 in the setting of severe pain. X-rays including MRI of the lumbar spine revealed question of discitis or osteomyelitis in the L2/L3 region. Patient did require IV labetalol infusion initially to control her blood pressure but she developed bradycardia with heart rates in the 30s so this was discontinued and since that time patient's blood pressure although not well controlled is much better controlled.  Assessment/Plan: Principal Problem:  *Acute back pain/ ? Osteomyelitis vs diskitis spine -Back pain is better but c/o ongoing pain in right leg -Cause of question potential infectious source for the pain infectious disease consultation has been obtained -Patient does not have leukocytosis or fever so we'll defer initiation of antibiotics unless infectious disease service feels this is warranted -Continue supportive care with analgesics -Please note ESR mildly elevated at 38 and CRP low at 0.5 which point less toward an infectious etiology to the patient's back pain  Active Problems:  Abnormal involuntary movements -Patient presented to the hospital with uncontrolled hypertension since admission has developed dyskinetic involuntary movements involving the right side of the face and upper  extremities -Neurology has been consulted -Agree with EEG to rule out seizure activity-started on Depakote by Neurology -Patient recently started on Vicodin on 03/18/2012 and the only other new medication she has received this admission his labetalol and neither of these drugs are likely to cause medication induced dystonia  UPDATE--1604: Spoke with nephrologist who states these movements are likely from uremia from her missing HD several times- (we were not aware of her missing dialysis). He has HELD the MRI and plans are to dialyze today. We will d/c Depakote for now as well.    PVD (peripheral vascular disease) -Given underlying vascular disease the radiculopathy and pain that occurs with activity could also be a sign of claudication so may benefit from lower extremity arterial Dopplers   HTN (hypertension), malignant -Diastolic blood pressure back up to 113 -Maximize pain control -Normally dialyzes on Tuesdays Thursdays and Saturdays 7 may need to be dialyzed (see below) -Currently on amlodipine, lisinopril for blood pressure-if after dialysis blood pressure is not improved may need to adjust medications up or any medications for hypertension   CKD (chronic kidney disease) stage V requiring chronic dialysis -Dialyzes Tuesday Thursday Saturday, will make sure nephrology aware of patient's admission so can have dialysis today   Hypothyroidism -Continue Synthroid   Anemia in chronic kidney disease(285.21) -Hemoglobin stable   DVT prophylaxis: Subcutaneous heparin Code Status: Full Family Communication: Spoke with patient Disposition Plan: Change admit status from stepdown to MedSurg floor  Consultants: Nephrology Neurology  Procedures: None  Antibiotics: None  HPI/Subjective: Patient awake and states hip and leg pain better controlled. Endorses the involuntary movements seen today are new and she has never had these before. Denies shortness of breath or chest  pain.   Objective: Blood pressure  140/113, pulse 61, temperature 98.1 F (36.7 C), temperature source Oral, resp. rate 24, SpO2 97.00%. No intake or output data in the 24 hours ending 03/21/12 1527   Exam: Followup exam completed  Data Reviewed: Basic Metabolic Panel:  Lab 03/20/12 1610  NA 137  K 4.2  CL 98  CO2 --  GLUCOSE 86  BUN 61*  CREATININE 14.60*  CALCIUM --  MG --  PHOS --   Liver Function Tests:  Lab 03/20/12 2011  AST 12  ALT 5  ALKPHOS 72  BILITOT 0.3  PROT 7.1  ALBUMIN 3.3*   No results found for this basename: LIPASE:5,AMYLASE:5 in the last 168 hours No results found for this basename: AMMONIA:5 in the last 168 hours CBC:  Lab 03/21/12 0115 03/20/12 2114  WBC 8.4 --  NEUTROABS -- --  HGB 9.4* 9.9*  HCT 27.6* 29.0*  MCV 96.5 --  PLT 179 --   Cardiac Enzymes: No results found for this basename: CKTOTAL:5,CKMB:5,CKMBINDEX:5,TROPONINI:5 in the last 168 hours BNP (last 3 results) No results found for this basename: PROBNP:3 in the last 8760 hours CBG: No results found for this basename: GLUCAP:5 in the last 168 hours  Recent Results (from the past 240 hour(s))  URINE CULTURE     Status: Normal   Collection Time   03/18/12  6:58 AM      Component Value Range Status Comment   Specimen Description URINE, RANDOM   Final    Special Requests NONE   Final    Culture  Setup Time 03/18/2012 15:55   Final    Colony Count >=100,000 COLONIES/ML   Final    Culture     Final    Value: Multiple bacterial morphotypes present, none predominant. Suggest appropriate recollection if clinically indicated.   Report Status 03/19/2012 FINAL   Final      Studies:  Recent x-ray studies have been reviewed in detail by the Attending Physician  Scheduled Meds:  Reviewed in detail by the Attending Physician   Junious Silk, ANP [facility] Office  [phone] Pager [phone]  On-Call/Text Page:      [name]      password [email or web address]  If  7PM-7AM, please contact night-coverage [email or web address] Password [email or web address] 03/21/2012, 3:27 PM   LOS: 1 day   I have examined the patient and reviewed the chart and agree with the above note which I have modified.   Calvert Cantor, MD [phone]

## 2012-03-21 NOTE — Consult Note (Signed)
[name] Consultation Note    Indication for Consultation:  Management of ESRD/hemodialysis; anemia, hypertension/volume and secondary hyperparathyroidism  HPI: Nicole [name] is a 79 y.o. female ESRD patient (TTS HD) who presented to the ER today with 2 weeks of constant, sharp pain in her lower back mostly on the right side and radiates down to the right buttocks into the right leg, worse with abulation. She denies any falls or trauma and has no urinary symptoms.  She was seen in the ED on 03/18/2012 for the same and was discharged home. Patient says that she was taking Vicodin at home with no improvement. ED workup also showed uncontrolled malignant hypertension with highest BP of 286/74. MRI of the lumbar spine was done, final results are still pending but preliminary results shows probable discitis or osteomyelitis in the L2-L3 region  At the time of this encounter she is resting in the ED with no complaints other than back pain and constipation. However, she exhibits profound myoclonic movements of the head, neck and right upper body and extremity that she does not acknowledge until asked. These symptoms have been present for about a day. While she does endorse starting new medications for her thyroid, she also has not had an HD treatment since 03/14/12 making these symptoms more suggestive of a uremic process.   Past Medical History  Diagnosis Date  . Hypertension   . ESRD (end stage renal disease)   . Peripheral vascular disease     revascularization appx. 14 years ago  . Hypothyroidism   . Hiatal hernia   . Iron deficiency anemia   . Osteoarthritis   . Depression with anxiety   . Pulmonary embolism   . AAA (abdominal aortic aneurysm) 1999    aortic byfem bypass   Past Surgical History  Procedure Date  . Abdominal surgery about 14 years ago    REVASCULARIZATION  . Ateriovenous graft 04/18/2009    LEFT FOREARM avg  . Av fistula placement, radiocephalic  01/09/10  . Avgg removal 12/02/09    SECONDARY TO INFECTION LT FOREARM  . Av fistula placement, radiocephalic 05/08/10    NONMATURED  . Arteriovenous graft placement 07/24/10    RIGHT FOREARM LOOP  . Dg av dialysis graft declot or 12/05/10, 6/17/123, 09/24/11, 01/26/12    CLOTTED GRAFT  . Angioplasty 12/21/11    of RUE AVG   No family history on file. Social History:  reports that she has quit smoking. She does not have any smokeless tobacco history on file. She reports that she does not drink alcohol or use illicit drugs. No Known Allergies Prior to Admission medications   Medication Sig Start Date End Date Taking? Authorizing Provider  amLODipine (NORVASC) 10 MG tablet Take 10 mg by mouth at bedtime.   Yes Historical Provider, MD  aspirin 81 MG EC tablet Take 81 mg by mouth daily. 06/15/11 06/14/12 Yes Maree Krabbe, MD  calcium acetate (PHOSLO) 667 MG capsule Take 2 capsules (1,334 mg total) by mouth 3 (three) times daily with meals. 06/15/11 06/14/12 Yes Maree Krabbe, MD  HYDROcodone-acetaminophen (NORCO/VICODIN) 5-325 MG per tablet Take 1 tablet by mouth every 6 (six) hours as needed for pain. 03/18/12  Yes Hannah Muthersbaugh, PA-C  ibuprofen (ADVIL,MOTRIN) 400 MG tablet Take 1 tablet (400 mg total) by mouth every 6 (six) hours as needed for pain. 02/04/12  Yes Remi Haggard, NP  levothyroxine (SYNTHROID, LEVOTHROID) 100 MCG tablet Take 1 tablet (100 mcg total) by mouth daily  before breakfast. 06/16/11 06/15/12 Yes Maree Krabbe, MD  lisinopril (PRINIVIL,ZESTRIL) 40 MG tablet Take 40 mg by mouth at bedtime.   Yes Historical Provider, MD  meloxicam (MOBIC) 15 MG tablet Take 1 tablet (15 mg total) by mouth daily. 03/18/12  Yes Hannah Muthersbaugh, PA-C  multivitamin (RENA-VIT) TABS tablet Take 1 tablet by mouth at bedtime. 06/15/11  Yes Maree Krabbe, MD  polyethylene glycol powder (GLYCOLAX/MIRALAX) powder Take 17 g by mouth 2 (two) times daily. Until daily soft stools  OTC 03/18/12  Yes  Hannah Muthersbaugh, PA-C   Current Facility-Administered Medications  Medication Dose Route Frequency Provider Last Rate Last Dose  . acetaminophen (TYLENOL) tablet 650 mg  650 mg Oral Q6H PRN Ripudeep Jenna Luo, MD       Or  . acetaminophen (TYLENOL) suppository 650 mg  650 mg Rectal Q6H PRN Ripudeep K Rai, MD      . amLODipine (NORVASC) tablet 10 mg  10 mg Oral QHS Ripudeep K Rai, MD      . aspirin EC tablet 81 mg  81 mg Oral Daily Ripudeep K Rai, MD   81 mg at 03/21/12 1356  . bisacodyl (DULCOLAX) suppository 10 mg  10 mg Rectal Daily PRN Russella Dar, NP      . calcium acetate (PHOSLO) capsule 1,334 mg  1,334 mg Oral TID WC Ripudeep Jenna Luo, MD   1,334 mg at 03/21/12 1357  . docusate sodium (COLACE) capsule 100 mg  100 mg Oral BID Russella Dar, NP   100 mg at 03/21/12 1513  . heparin injection 5,000 Units  5,000 Units Subcutaneous Q8H Ripudeep K Rai, MD      . HYDROcodone-acetaminophen (NORCO/VICODIN) 5-325 MG per tablet 1 tablet  1 tablet Oral Q6H PRN Ripudeep Jenna Luo, MD   1 tablet at 03/21/12 1621  . HYDROmorphone (DILAUDID) injection 1 mg  1 mg Intravenous Q4H PRN Ripudeep K Rai, MD      . ibuprofen (ADVIL,MOTRIN) tablet 400 mg  400 mg Oral Q6H PRN Ripudeep K Rai, MD      . levothyroxine (SYNTHROID, LEVOTHROID) tablet 100 mcg  100 mcg Oral QAC breakfast Ripudeep Jenna Luo, MD   100 mcg at 03/21/12 1358  . lisinopril (PRINIVIL,ZESTRIL) tablet 40 mg  40 mg Oral QHS Ripudeep K Rai, MD      . meloxicam (MOBIC) tablet 15 mg  15 mg Oral Daily Ripudeep K Rai, MD   15 mg at 03/21/12 1358  . multivitamin (RENA-VIT) tablet 1 tablet  1 tablet Oral QHS Ripudeep K Rai, MD      . ondansetron (ZOFRAN) tablet 4 mg  4 mg Oral Q6H PRN Ripudeep Jenna Luo, MD       Or  . ondansetron (ZOFRAN) injection 4 mg  4 mg Intravenous Q6H PRN Ripudeep K Rai, MD      . polyethylene glycol (MIRALAX / GLYCOLAX) packet 17 g  17 g Oral Daily Russella Dar, NP      . sodium chloride 0.9 % injection 3 mL  3 mL Intravenous Q12H  Ripudeep K Rai, MD      . sodium phosphate (FLEET) 7-19 GM/118ML enema 1 enema  1 enema Rectal Daily PRN Russella Dar, NP      . valproic acid (DEPAKENE) 250 MG capsule 250 mg  250 mg Oral Q8H Ulice Dash, [place]       Labs: Basic Metabolic Panel:  Lab 03/20/12 5188  NA 137  K 4.2  CL 98  CO2 --  GLUCOSE 86  BUN 61*  CREATININE 14.60*  CALCIUM --  ALB --  PHOS --   Liver Function Tests:  Lab 03/20/12 2011  AST 12  ALT 5  ALKPHOS 72  BILITOT 0.3  PROT 7.1  ALBUMIN 3.3*   No results found for this basename: LIPASE:3,AMYLASE:3 in the last 168 hours No results found for this basename: AMMONIA:3 in the last 168 hours CBC:  Lab 03/21/12 0115 03/20/12 2114  WBC 8.4 --  NEUTROABS -- --  HGB 9.4* 9.9*  HCT 27.6* 29.0*  MCV 96.5 --  PLT 179 --   Studies/Results: Dg Lumbar Spine Complete  03/20/2012  *RADIOLOGY REPORT*  Clinical Data: Back and leg pain.  LUMBAR SPINE - COMPLETE 4+ VIEW  Comparison: None.  Findings: There is irregular destruction of the endplates about the L2-3 interspace.  There is some sclerosis in the adjacent vertebral bodies.  There is some loss of height of L3 anteriorly.  There is marked narrowing of the L4-5 interspace.  Probable L4 pars defects allow approximately 12 mm anterolisthesis of L4 on L5. Vascular clips in the mid abdomen and over the left hip. Patchy aortic calcifications.  IMPRESSION:  1.  Probable diskitis/osteomyelitis L2-3.  Consider MRI lumbar spine with contrast for further characterization. 2.  Advanced degenerative disc disease L4-5 with grade II anterolisthesis related to L4 pars defects.   Original Report Authenticated By: D. Andria Rhein, MD    Mr Lumbar Spine Wo Contrast  03/21/2012  *RADIOLOGY REPORT*  Clinical Data: Low back pain and right leg pain.  Abnormal lumbar radiographs.  MRI LUMBAR SPINE WITHOUT CONTRAST  Technique:  Multiplanar and multiecho pulse sequences of the lumbar spine were obtained without intravenous contrast.   Comparison: Radiographs dated 03/20/2012 and a CT scan dated 12/26/2003  Findings: Tip of the conus is at L1 and appears normal.  Paraspinal soft tissues demonstrate a single tiny stone in the gallbladder wall and bilateral renal atrophy.  The patient has an aortobifemoral graft.   T10-11 through L1-2:  Normal.  L2-3:  There is a broad-based soft disc protrusion severely compressing the thecal sac trapping the nerve roots.  Hypertrophy of the ligamenta flava contributes to the severe spinal stenosis. The disc protrusion extends into both neural foramina, right more than left.  The right L2 nerve is compressed in the neural foramen.  There is sclerosis and edema in the endplates of L2 and L3 with erosions of the endplates as well.  There is a tiny amount of fluid in the disc space.  There is no paraspinal phlegmon or abscess. There is no epidural abscess.  L3-4:  Normal.  L4-5:  12 mm spondylolisthesis due to bilateral pars defects. Marked disc space narrowing.  Severe right foraminal stenosis.  The right L4 nerve is markedly compressed in the lateral aspect of the neural foramen.  The left L4 nerve appears to exit without severe compression.  No spinal stenosis.  No disc protrusion.  L5-S1:  Tiny bulge of the disc into the left lateral recess. Hypertrophy of the facet joints and ligamenta flava narrows the left lateral recess with osteophyte immediately adjacent to the left S1 nerve root sleeve without neural impingement.  IMPRESSION:  1.  Severe spinal stenosis at L2-3 with broad-based soft disc protrusion extending into the right neural foramen compressing the right L2 nerve in the neural foramen. 2.  Abnormal appearance of the disc space and endplates at L2-3.  I suspect this is  chronic degenerative disc disease but there is a tiny amount of fluid in the disc space as well as a subtle edema in the vertebral bodies which could represent a low grade infection. However, there is no paraspinal soft tissue  inflammation.  The patient does not have an elevated white blood count.  If there is clinical concern for infection , tissue sampling of the disc space could be performed. 3.  Severe right foraminal stenosis at L4-5 compressing the right L4 nerve.  This is felt to be chronic.   Original Report Authenticated By: Francene Boyers, M.D.     ROS:   Physical Exam: Filed Vitals:   03/21/12 1615 03/21/12 1629 03/21/12 1641 03/21/12 1700  BP: 151/91 230/87 231/90 207/93  Pulse: 67 63 61 61  Temp: 98.1 F (36.7 C) 98.4 F (36.9 C)    TempSrc: Oral Oral    Resp: 18 18    SpO2: 98% 100%       General: Elderly, chronically ill-appearing, intense myoclonus of bilat UE's and head and neck Head: Normocephalic, atraumatic, sclera non-icteric, mucus membranes are moist Neck: Supple. Pulsatile JVD , R > L Lungs: Clear bilaterally to auscultation without wheezes, rales, or rhonchi. Breathing is unlabored. Heart: RRR with S1 S2. No murmurs, rubs, or gallops appreciated. Abdomen: Soft, non-tender, non-distended with normoactive bowel sounds. No rebound/guarding. No obvious abdominal masses. M-S:  Strength and tone appear normal for age. Lower extremities:without edema or ischemic changes, no open wounds  Neuro: Alert. Oriented to time, place and person. + asterixis and myoclonus but not appropriately concerned.  Psych:  Responds to questions appropriately with a normal affect. Dialysis Access: RFA AVG with + bruit  Dialysis Orders: Center: [facility]  on TTS - no tx since 03/14/12. EDW 58 kg HD Bath 2K+/ 2.25 Ca++   Time 4:00 Heparin 6000. Access RFA AVG BFR 400 DFR A1.5    Hectorol 2 mcg IV/HD Epogen 2400   Units IV/HD  Venofer none. Has been leaving below EDW at OP center by about 0.5 kg prior to 03/14/12.   Assessment/Plan: 1. Acute back pain/lumbar radiculopathy - Per admit, likely secondary to possible discitis/osteomyelitis vs degenerative disease in the L2-3 region.  Admitted to step-down. Blood cultures  x2, ESR and CRP. ID and ortho consults obtained. IV abx pending.  2.  ESRD -  TTS at [facility].  No treatment since 03/14/12.  Profound myoclonus suggestive of uremia.  3.  Hypertension/volume  - Malignant HTN. Most recent BP 207/93 but has been as high as 280s systolic.  HR in the 60s. On Lisinopril 40mg  and Norvasc 10mg  QHS. PE suggestive of volume overload. Will try for UF goal of 3L and evaluate post wgts.  Consider clonidine 0.1mg  PRN. 4.  Anemia  - Hgb 9.4 on OP Epogen. Tsat 69% in Dec. Last Ferritin 800 in October.  Will start Aranesp 25 Q week on HD. 5.  Metabolic bone disease - Ca+ 10.1 (11.1 corrected) Phos 4.2 in December. Last PTH 322.2 in October.  On Hectorol 2 mcg Op - will hold for now. 2.0Ca++ bath for now. 6.  Nutrition - Albumin 2.7. High protein renal diet and multivitamin. Add Breeze supplement. 7. Hypothyroidism - Continue Synthroid per primary.  Claud Kelp, PA-C [name] [phone] pager 03/21/2012, 5:17 PM   Patient seen and examined and reviewed with Claud Kelp, P.A.Marland Kitchen  79 yo ESRD pt with HTN presenting with low back pain and MRI suggestive of infectious vs degenerative process at L2-L3. Also  missed HD x 2, probably due to back pain, and is showing signs of uremia with marked myoclonic jerking. Plan HD today asap, and possibly again tomorrow if symptoms have not improved. Ordered IV ativan prn for jerking as this works well.  Back pain work-up per primary. Will follow. Vinson Moselle  MD [name] [phone] pgr    [phone] cell 03/21/2012, 6:36 PM

## 2012-03-21 NOTE — ED Notes (Signed)
Pt told renal that because of her back pain she has missed the last 2 dialysis Thursday and sat she is due  today ,renal states that her tremors are from not being dialysized.

## 2012-03-21 NOTE — ED Notes (Signed)
Pt up to br states feels need to have bm but could not. Up on side of bed to eat breakfast , washed face and hands and then wanted to nap

## 2012-03-21 NOTE — Procedures (Signed)
I was present at this dialysis session. I have reviewed the session itself and made appropriate changes.   Vinson Moselle, MD [employer] 03/21/2012, 4:40 PM

## 2012-03-21 NOTE — Progress Notes (Signed)
Dr. Craige Cotta called and informed of hypotensive episode in hemodialysis and Tx not being completed, states to take pt back to 6700 on tele and she will be up to evaluate her shortly. Report given to [employer], Charity fundraiser.

## 2012-03-21 NOTE — Progress Notes (Signed)
Shift event:  RN from dialysis unit paged NP earlier because pt was confused and agitated in HD. This NP ordered Haldol. However, RN called back 2/2 to pt having a hypotensive episode in the 80s then BP up to the 200s a few minutes later. NP to HD to eval pt. S: Pt denies chest pain, SOB. + back pain. RN says pt has calmed down and is not combative now. Had to be restrained to finish HD. RN did not give the Haldol. O: Her BP is in the 150 range now. O2 sats are normal. RR rate normal. HR normal. Pt is an elderly AAF lying on stretcher in HD unit in no distress. She is alert, oriented to self. Thinks it is February and that she is at her nephew's house. However, she is calm and cooperative at present. RRR. Resp effort is normal. MOE x 4. I see no focal neuro deficits. Her speech is clear. A/P: 1. Mental status change-? As to whether this is really a change or if pt may have a hx of dementia/sundowning. She is calm now and is in NAD. Will cont her restraints prn for tonight for safety. HOLD Haldol unless absolutely necessary.  2. Hypotensive episode in setting of HD with hx of HTN-episode only lasted a few moments. The episode did not change her mental status as # 1 occurred prior to BP drop. Will monitor BP closer tonight and use Hydralazine as needed for SBP over 170 as she has been as high as 200 range while in dialysis.  Given the pt is more calm and she is in no distress, I think it is reasonable to send her back to 6700.  Will follow.  Jimmye Norman, NP [facility]

## 2012-03-21 NOTE — ED Notes (Addendum)
Labetalol d/c for bradycardia (low 30s).

## 2012-03-21 NOTE — Progress Notes (Signed)
Pt very restless and confused. Is oriented to person only. Freuently bending arm and trying to climb OOB. Dr. Eliott Nine  Aware of confusion and safty risk of pulling lines out when on dialysis. DR. Killpatric beeped with no answer. Dr. Eliott Nine in unit and aware of her safety risk trying to get OOB while on hemodialysis.

## 2012-03-21 NOTE — Consult Note (Signed)
INFECTIOUS DISEASE CONSULT NOTE  Date of Admission:  03/20/2012  Date of Consult:  03/21/2012  Reason for Consult: Osteomyelitis/Diskitis Referring Physician: Butler Denmark  Impression/Recommendation L2/3 Osteomyelitis/Diskitis ESRD  Would- Continue to hold anbx unless she clinically changes (fever, hypotension, persistent mental status change) Ask IR to eval for aspirate of her L2/3 area.  Comment- she is certainly at risk for seeding given her frequent acsess. Hopefully we can obtain a Cx before treating her empirically.     Thank you so much for this interesting consult,   Nicole [name] [phone]  Nicole [name] is an 79 y.o. female.  HPI: 79 yo F with hx of ESRD comes to ED today with 2 weeks of sharp R low back pain with radiation into leg. She underwent MRI showing: Abnormal appearance of the disc space and endplates at L2-3. I suspect this is chronic degenerative disc disease but there is a tiny amount of fluid in the disc space as well as a subtle edema in the vertebral bodies which could represent a low grade infection. However, there is no paraspinal soft tissue inflammation.  Her ED course was also notable for SBP 286 and then bradycardia after IV lebetalol. She had involuntary movements in ED as well and has been evaluated by neurology. She was also seen by nephrology who felt that these were due to uremia from pt missing several HD sessions.  In ED she had a NL WBC and was afebrile. BCx were sent.   Past Medical History  Diagnosis Date  . Hypertension   . ESRD (end stage renal disease)   . Peripheral vascular disease     revascularization appx. 14 years ago  . Hypothyroidism   . Hiatal hernia   . Iron deficiency anemia   . Osteoarthritis   . Depression with anxiety   . Pulmonary embolism   . AAA (abdominal aortic aneurysm) 1999    aortic byfem bypass    Past Surgical History  Procedure Date  . Abdominal surgery about 14 years ago    REVASCULARIZATION  .  Ateriovenous graft 04/18/2009    LEFT FOREARM avg  . Av fistula placement, radiocephalic 01/09/10  . Avgg removal 12/02/09    SECONDARY TO INFECTION LT FOREARM  . Av fistula placement, radiocephalic 05/08/10    NONMATURED  . Arteriovenous graft placement 07/24/10    RIGHT FOREARM LOOP  . Dg av dialysis graft declot or 12/05/10, 6/17/123, 09/24/11, 01/26/12    CLOTTED GRAFT  . Angioplasty 12/21/11    of RUE AVG     No Known Allergies  Medications:  Scheduled:   . amLODipine  10 mg Oral QHS  . aspirin  81 mg Oral Daily  . calcium acetate  1,334 mg Oral TID WC  . darbepoetin      . darbepoetin (ARANESP) injection - DIALYSIS  25 mcg Intravenous Q Tue-HD  . docusate sodium  100 mg Oral BID  . feeding supplement  1 Container Oral TID BM  . heparin  5,000 Units Subcutaneous Q8H  . levothyroxine  100 mcg Oral QAC breakfast  . lisinopril  40 mg Oral QHS  . meloxicam  15 mg Oral Daily  . multivitamin  1 tablet Oral QHS  . polyethylene glycol  17 g Oral Daily  . sodium chloride  3 mL Intravenous Q12H    Total days of antibiotics 0          Social History:  reports that she has quit smoking. She does not have any  smokeless tobacco history on file. She reports that she does not drink alcohol or use illicit drugs.  No family history on file.  General ROS: per nsg- pt confused. does not make urine. has been constipated. denies f/c. denies problems with HD graft. no cough. see HPI.   Blood pressure 185/83, pulse 65, temperature 98.4 F (36.9 C), temperature source Oral, resp. rate 18, SpO2 100.00%. General appearance: alert, cooperative and no distress Eyes: negative findings: pupils equal, round, reactive to light and accomodation Throat: normal findings: oropharynx pink & moist without lesions or evidence of thrush Neck: no adenopathy and supple, symmetrical, trachea midline Lungs: wheezes expiratory on R Heart: regular rate and rhythm Abdomen: normal findings: bowel sounds normal and  soft, non-tender Extremities: edema none.  and RUE HD access site Neurologic: Mental status: date- february 2014. location- [facility]". Would you believe me if i told you, you are in the hospital- no.    Results for orders placed during the hospital encounter of 03/20/12 (from the past 48 hour(s))  TSH     Status: Abnormal   Collection Time   03/20/12  8:11 PM      Component Value Range Comment   TSH 25.304 (*) 0.350 - 4.500 uIU/mL   HEPATIC FUNCTION PANEL     Status: Abnormal   Collection Time   03/20/12  8:11 PM      Component Value Range Comment   Total Protein 7.1  6.0 - 8.3 g/dL    Albumin 3.3 (*) 3.5 - 5.2 g/dL    AST 12  0 - 37 U/L    ALT 5  0 - 35 U/L    Alkaline Phosphatase 72  39 - 117 U/L    Total Bilirubin 0.3  0.3 - 1.2 mg/dL    Bilirubin, Direct 0.2  0.0 - 0.3 mg/dL    Indirect Bilirubin 0.1 (*) 0.3 - 0.9 mg/dL   POCT I-STAT, CHEM 8     Status: Abnormal   Collection Time   03/20/12  9:14 PM      Component Value Range Comment   Sodium 137  135 - 145 mEq/L    Potassium 4.2  3.5 - 5.1 mEq/L    Chloride 98  96 - 112 mEq/L    BUN 61 (*) 6 - 23 mg/dL    Creatinine, Ser 91.47 (*) 0.50 - 1.10 mg/dL    Glucose, Bld 86  70 - 99 mg/dL    Calcium, Ion 8.29 (*) 1.13 - 1.30 mmol/L    TCO2 27  0 - 100 mmol/L    Hemoglobin 9.9 (*) 12.0 - 15.0 g/dL    HCT 56.2 (*) 13.0 - 46.0 %   CBC     Status: Abnormal   Collection Time   03/21/12  1:15 AM      Component Value Range Comment   WBC 8.4  4.0 - 10.5 K/uL    RBC 2.86 (*) 3.87 - 5.11 MIL/uL    Hemoglobin 9.4 (*) 12.0 - 15.0 g/dL    HCT 86.5 (*) 78.4 - 46.0 %    MCV 96.5  78.0 - 100.0 fL    MCH 32.9  26.0 - 34.0 pg    MCHC 34.1  30.0 - 36.0 g/dL    RDW 69.6  29.5 - 28.4 %    Platelets 179  150 - 400 K/uL   SEDIMENTATION RATE     Status: Abnormal   Collection Time   03/21/12  3:15 AM  Component Value Range Comment   Sed Rate 38 (*) 0 - 22 mm/hr   C-REACTIVE PROTEIN     Status: Abnormal   Collection Time   03/21/12   3:15 AM      Component Value Range Comment   CRP 0.5 (*) <0.60 mg/dL   RENAL FUNCTION PANEL     Status: Abnormal   Collection Time   03/21/12  4:45 PM      Component Value Range Comment   Sodium 135  135 - 145 mEq/L    Potassium 4.3  3.5 - 5.1 mEq/L    Chloride 92 (*) 96 - 112 mEq/L    CO2 24  19 - 32 mEq/L    Glucose, Bld 121 (*) 70 - 99 mg/dL    BUN 67 (*) 6 - 23 mg/dL    Creatinine, Ser 16.10 (*) 0.50 - 1.10 mg/dL    Calcium 8.5  8.4 - 96.0 mg/dL    Phosphorus 4.9 (*) 2.3 - 4.6 mg/dL    Albumin 2.9 (*) 3.5 - 5.2 g/dL    GFR calc non Af Amer 2 (*) >90 mL/min    GFR calc Af Amer 2 (*) >90 mL/min       Component Value Date/Time   SDES URINE, RANDOM 03/18/2012 0658   SPECREQUEST NONE 03/18/2012 0658   CULT Multiple bacterial morphotypes present, none predominant. Suggest appropriate recollection if clinically indicated. 03/18/2012 0658   REPTSTATUS 03/19/2012 FINAL 03/18/2012 0658   Dg Lumbar Spine Complete  03/20/2012  *RADIOLOGY REPORT*  Clinical Data: Back and leg pain.  LUMBAR SPINE - COMPLETE 4+ VIEW  Comparison: None.  Findings: There is irregular destruction of the endplates about the L2-3 interspace.  There is some sclerosis in the adjacent vertebral bodies.  There is some loss of height of L3 anteriorly.  There is marked narrowing of the L4-5 interspace.  Probable L4 pars defects allow approximately 12 mm anterolisthesis of L4 on L5. Vascular clips in the mid abdomen and over the left hip. Patchy aortic calcifications.  IMPRESSION:  1.  Probable diskitis/osteomyelitis L2-3.  Consider MRI lumbar spine with contrast for further characterization. 2.  Advanced degenerative disc disease L4-5 with grade II anterolisthesis related to L4 pars defects.   Original Report Authenticated By: D. Andria Rhein, MD    Mr Lumbar Spine Wo Contrast  03/21/2012  *RADIOLOGY REPORT*  Clinical Data: Low back pain and right leg pain.  Abnormal lumbar radiographs.  MRI LUMBAR SPINE WITHOUT CONTRAST   Technique:  Multiplanar and multiecho pulse sequences of the lumbar spine were obtained without intravenous contrast.  Comparison: Radiographs dated 03/20/2012 and a CT scan dated 12/26/2003  Findings: Tip of the conus is at L1 and appears normal.  Paraspinal soft tissues demonstrate a single tiny stone in the gallbladder wall and bilateral renal atrophy.  The patient has an aortobifemoral graft.   T10-11 through L1-2:  Normal.  L2-3:  There is a broad-based soft disc protrusion severely compressing the thecal sac trapping the nerve roots.  Hypertrophy of the ligamenta flava contributes to the severe spinal stenosis. The disc protrusion extends into both neural foramina, right more than left.  The right L2 nerve is compressed in the neural foramen.  There is sclerosis and edema in the endplates of L2 and L3 with erosions of the endplates as well.  There is a tiny amount of fluid in the disc space.  There is no paraspinal phlegmon or abscess. There is no epidural abscess.  L3-4:  Normal.  L4-5:  12 mm spondylolisthesis due to bilateral pars defects. Marked disc space narrowing.  Severe right foraminal stenosis.  The right L4 nerve is markedly compressed in the lateral aspect of the neural foramen.  The left L4 nerve appears to exit without severe compression.  No spinal stenosis.  No disc protrusion.  L5-S1:  Tiny bulge of the disc into the left lateral recess. Hypertrophy of the facet joints and ligamenta flava narrows the left lateral recess with osteophyte immediately adjacent to the left S1 nerve root sleeve without neural impingement.  IMPRESSION:  1.  Severe spinal stenosis at L2-3 with broad-based soft disc protrusion extending into the right neural foramen compressing the right L2 nerve in the neural foramen. 2.  Abnormal appearance of the disc space and endplates at L2-3.  I suspect this is chronic degenerative disc disease but there is a tiny amount of fluid in the disc space as well as a subtle edema in  the vertebral bodies which could represent a low grade infection. However, there is no paraspinal soft tissue inflammation.  The patient does not have an elevated white blood count.  If there is clinical concern for infection , tissue sampling of the disc space could be performed. 3.  Severe right foraminal stenosis at L4-5 compressing the right L4 nerve.  This is felt to be chronic.   Original Report Authenticated By: Francene Boyers, M.D.    Recent Results (from the past 240 hour(s))  URINE CULTURE     Status: Normal   Collection Time   03/18/12  6:58 AM      Component Value Range Status Comment   Specimen Description URINE, RANDOM   Final    Special Requests NONE   Final    Culture  Setup Time 03/18/2012 15:55   Final    Colony Count >=100,000 COLONIES/ML   Final    Culture     Final    Value: Multiple bacterial morphotypes present, none predominant. Suggest appropriate recollection if clinically indicated.   Report Status 03/19/2012 FINAL   Final       03/21/2012, 7:23 PM     LOS: 1 day

## 2012-03-21 NOTE — ED Notes (Signed)
MD is aware of latest BP.

## 2012-03-21 NOTE — ED Notes (Signed)
Renal dr into see pt now

## 2012-03-21 NOTE — Consult Note (Signed)
NEURO HOSPITALIST CONSULT NOTE    Reason for Consult: Myoclonus  HPI:                                                                                                                                          Nicole [name] is an 79 y.o. female Who was admitted tot he hospital for right back and leg pain. Upon arrival she was noted to have BP 286/74.  During work up patient was noted to have abnormal movents involving her right face, neck and arm while talking or moving.  Neurology was asked to see patient to asess movents and possible considerations for treatment.   Past Medical History  Diagnosis Date  . Hypertension   . ESRD (end stage renal disease)   . Peripheral vascular disease     revascularization appx. 14 years ago  . Hypothyroidism   . Hiatal hernia   . Iron deficiency anemia   . Osteoarthritis   . Depression with anxiety   . Pulmonary embolism   . AAA (abdominal aortic aneurysm) 1999    aortic byfem bypass    Past Surgical History  Procedure Date  . Abdominal surgery about 14 years ago    REVASCULARIZATION  . Ateriovenous graft 04/18/2009    LEFT FOREARM avg  . Av fistula placement, radiocephalic 01/09/10  . Avgg removal 12/02/09    SECONDARY TO INFECTION LT FOREARM  . Av fistula placement, radiocephalic 05/08/10    NONMATURED  . Arteriovenous graft placement 07/24/10    RIGHT FOREARM LOOP  . Dg av dialysis graft declot or 12/05/10, 6/17/123, 09/24/11, 01/26/12    CLOTTED GRAFT  . Angioplasty 12/21/11    of RUE AVG    No family history on file.  Family History: Mother HTN Father HTN  Social History:  reports that she has quit smoking. She does not have any smokeless tobacco history on file. She reports that she does not drink alcohol or use illicit drugs.  No Known Allergies  MEDICATIONS:                                                                                                                     Current Facility-Administered  Medications  Medication Dose Route Frequency Provider Last Rate Last Dose  .  acetaminophen (TYLENOL) tablet 650 mg  650 mg Oral Q6H PRN Ripudeep Jenna Luo, MD       Or  . acetaminophen (TYLENOL) suppository 650 mg  650 mg Rectal Q6H PRN Ripudeep Jenna Luo, MD      . alum & mag hydroxide-simeth (MAALOX/MYLANTA) 200-200-20 MG/5ML suspension 30 mL  30 mL Oral Q6H PRN Ripudeep K Rai, MD      . amLODipine (NORVASC) tablet 10 mg  10 mg Oral QHS Ripudeep K Rai, MD      . aspirin EC tablet 81 mg  81 mg Oral Daily Ripudeep K Rai, MD      . calcium acetate (PHOSLO) capsule 1,334 mg  1,334 mg Oral TID WC Ripudeep K Rai, MD      . HYDROcodone-acetaminophen (NORCO/VICODIN) 5-325 MG per tablet 1 tablet  1 tablet Oral Q6H PRN Ripudeep K Rai, MD      . HYDROmorphone (DILAUDID) injection 1 mg  1 mg Intravenous Q4H PRN Ripudeep K Rai, MD      . ibuprofen (ADVIL,MOTRIN) tablet 400 mg  400 mg Oral Q6H PRN Ripudeep K Rai, MD      . labetalol (NORMODYNE,TRANDATE) 4 mg/mL in dextrose 5 % 125 mL infusion  0.5-3 mg/min Intravenous Titrated Ripudeep K Rai, MD   0.5 mg/min at 03/21/12 0352  . levothyroxine (SYNTHROID, LEVOTHROID) tablet 100 mcg  100 mcg Oral QAC breakfast Ripudeep K Rai, MD      . lisinopril (PRINIVIL,ZESTRIL) tablet 40 mg  40 mg Oral QHS Ripudeep K Rai, MD      . meloxicam (MOBIC) tablet 15 mg  15 mg Oral Daily Ripudeep K Rai, MD      . multivitamin (RENA-VIT) tablet 1 tablet  1 tablet Oral QHS Ripudeep K Rai, MD      . ondansetron (ZOFRAN) tablet 4 mg  4 mg Oral Q6H PRN Ripudeep K Rai, MD       Or  . ondansetron (ZOFRAN) injection 4 mg  4 mg Intravenous Q6H PRN Ripudeep Jenna Luo, MD       Current Outpatient Prescriptions  Medication Sig Dispense Refill  . amLODipine (NORVASC) 10 MG tablet Take 10 mg by mouth at bedtime.      Marland Kitchen aspirin 81 MG EC tablet Take 81 mg by mouth daily.      . calcium acetate (PHOSLO) 667 MG capsule Take 2 capsules (1,334 mg total) by mouth 3 (three) times daily with meals.      Marland Kitchen  HYDROcodone-acetaminophen (NORCO/VICODIN) 5-325 MG per tablet Take 1 tablet by mouth every 6 (six) hours as needed for pain.  30 tablet  0  . ibuprofen (ADVIL,MOTRIN) 400 MG tablet Take 1 tablet (400 mg total) by mouth every 6 (six) hours as needed for pain.  30 tablet  0  . levothyroxine (SYNTHROID, LEVOTHROID) 100 MCG tablet Take 1 tablet (100 mcg total) by mouth daily before breakfast.  60 tablet  2  . lisinopril (PRINIVIL,ZESTRIL) 40 MG tablet Take 40 mg by mouth at bedtime.      . meloxicam (MOBIC) 15 MG tablet Take 1 tablet (15 mg total) by mouth daily.  30 tablet  2  . multivitamin (RENA-VIT) TABS tablet Take 1 tablet by mouth at bedtime.      . polyethylene glycol powder (GLYCOLAX/MIRALAX) powder Take 17 g by mouth 2 (two) times daily. Until daily soft stools  OTC  255 g  0     ROS:  History obtained from the patient  General ROS: negative for - chills, fatigue, fever, night sweats, weight gain or weight loss Psychological ROS: negative for - behavioral disorder, hallucinations, memory difficulties, mood swings or suicidal ideation Ophthalmic ROS: negative for - blurry vision, double vision, eye pain or loss of vision ENT ROS: negative for - epistaxis, nasal discharge, oral lesions, sore throat, tinnitus or vertigo Allergy and Immunology ROS: negative for - hives or itchy/watery eyes Hematological and Lymphatic ROS: negative for - bleeding problems, bruising or swollen lymph nodes Endocrine ROS: negative for - galactorrhea, hair pattern changes, polydipsia/polyuria or temperature intolerance Respiratory ROS: negative for - cough, hemoptysis, shortness of breath or wheezing Cardiovascular ROS: negative for - chest pain, dyspnea on exertion, edema or irregular heartbeat Gastrointestinal ROS: negative for - abdominal pain, diarrhea, hematemesis,  nausea/vomiting or stool incontinence Genito-Urinary ROS: negative for - dysuria, hematuria, incontinence or urinary frequency/urgency Musculoskeletal ROS: positive for -back and leg pain Neurological ROS: as noted in HPI Dermatological ROS: negative for rash and skin lesion changes   Blood pressure 172/59, pulse 48, temperature 98.1 F (36.7 C), temperature source Oral, resp. rate 18, SpO2 96.00%.   Neurologic Examination:                                                                                                      Mental Status: Alert, oriented, thought content appropriate.  Speech fluent without evidence of aphasia at time but then with intermittently have tick/jerking movements of right face and arm along with interruptions of speech do to movements.  Able to follow 3 step commands without difficulty. Cranial Nerves: II: Discs flat bilaterally; Visual fields grossly normal, pupils equal, round, reactive to light and accommodation III,IV, VI: ptosis not present, extra-ocular motions intact bilaterally V,VII: smile symmetric, facial light touch sensation normal bilaterally VIII: hearing normal bilaterally IX,X: gag reflex present XI: bilateral shoulder shrug XII: midline tongue extension Motor: Right : Upper extremity   5/5    Left:     Upper extremity   5/5  Lower extremity   4/5 (pain)    Lower extremity   5/5 Tone and bulk:normal tone throughout; no atrophy noted Sensory: Pinprick and light touch intact throughout, bilaterally Deep Tendon Reflexes: 1+  Bilateral UE and LE with no AJ. symmetric throughout Plantars: Right: downgoing   Left: downgoing Cerebellar: normal finger-to-nose CV: pulses palpable throughout     Lab Results  Component Value Date/Time   CHOL  Value: 114        ATP III CLASSIFICATION:  <200     mg/dL   Desirable  147-829  mg/dL   Borderline High  >=562    mg/dL   High        13/10/6576  3:15 AM    Results for orders placed during the hospital  encounter of 03/20/12 (from the past 48 hour(s))  POCT I-STAT, CHEM 8     Status: Abnormal   Collection Time   03/20/12  9:14 PM      Component Value Range Comment   Sodium 137  135 -  145 mEq/L    Potassium 4.2  3.5 - 5.1 mEq/L    Chloride 98  96 - 112 mEq/L    BUN 61 (*) 6 - 23 mg/dL    Creatinine, Ser 16.10 (*) 0.50 - 1.10 mg/dL    Glucose, Bld 86  70 - 99 mg/dL    Calcium, Ion 9.60 (*) 1.13 - 1.30 mmol/L    TCO2 27  0 - 100 mmol/L    Hemoglobin 9.9 (*) 12.0 - 15.0 g/dL    HCT 45.4 (*) 09.8 - 46.0 %   CBC     Status: Abnormal   Collection Time   03/21/12  1:15 AM      Component Value Range Comment   WBC 8.4  4.0 - 10.5 K/uL    RBC 2.86 (*) 3.87 - 5.11 MIL/uL    Hemoglobin 9.4 (*) 12.0 - 15.0 g/dL    HCT 11.9 (*) 14.7 - 46.0 %    MCV 96.5  78.0 - 100.0 fL    MCH 32.9  26.0 - 34.0 pg    MCHC 34.1  30.0 - 36.0 g/dL    RDW 82.9  56.2 - 13.0 %    Platelets 179  150 - 400 K/uL   SEDIMENTATION RATE     Status: Abnormal   Collection Time   03/21/12  3:15 AM      Component Value Range Comment   Sed Rate 38 (*) 0 - 22 mm/hr   C-REACTIVE PROTEIN     Status: Abnormal   Collection Time   03/21/12  3:15 AM      Component Value Range Comment   CRP 0.5 (*) <0.60 mg/dL     Dg Lumbar Spine Complete  03/20/2012  *RADIOLOGY REPORT*  Clinical Data: Back and leg pain.  LUMBAR SPINE - COMPLETE 4+ VIEW  Comparison: None.  Findings: There is irregular destruction of the endplates about the L2-3 interspace.  There is some sclerosis in the adjacent vertebral bodies.  There is some loss of height of L3 anteriorly.  There is marked narrowing of the L4-5 interspace.  Probable L4 pars defects allow approximately 12 mm anterolisthesis of L4 on L5. Vascular clips in the mid abdomen and over the left hip. Patchy aortic calcifications.  IMPRESSION:  1.  Probable diskitis/osteomyelitis L2-3.  Consider MRI lumbar spine with contrast for further characterization. 2.  Advanced degenerative disc disease L4-5 with  grade II anterolisthesis related to L4 pars defects.   Original Report Authenticated By: D. Andria Rhein, MD    Mr Lumbar Spine Wo Contrast  03/21/2012  *RADIOLOGY REPORT*  Clinical Data: Low back pain and right leg pain.  Abnormal lumbar radiographs.  MRI LUMBAR SPINE WITHOUT CONTRAST  Technique:  Multiplanar and multiecho pulse sequences of the lumbar spine were obtained without intravenous contrast.  Comparison: Radiographs dated 03/20/2012 and a CT scan dated 12/26/2003  Findings: Tip of the conus is at L1 and appears normal.  Paraspinal soft tissues demonstrate a single tiny stone in the gallbladder wall and bilateral renal atrophy.  The patient has an aortobifemoral graft.   T10-11 through L1-2:  Normal.  L2-3:  There is a broad-based soft disc protrusion severely compressing the thecal sac trapping the nerve roots.  Hypertrophy of the ligamenta flava contributes to the severe spinal stenosis. The disc protrusion extends into both neural foramina, right more than left.  The right L2 nerve is compressed in the neural foramen.  There is sclerosis and edema in the endplates of  L2 and L3 with erosions of the endplates as well.  There is a tiny amount of fluid in the disc space.  There is no paraspinal phlegmon or abscess. There is no epidural abscess.  L3-4:  Normal.  L4-5:  12 mm spondylolisthesis due to bilateral pars defects. Marked disc space narrowing.  Severe right foraminal stenosis.  The right L4 nerve is markedly compressed in the lateral aspect of the neural foramen.  The left L4 nerve appears to exit without severe compression.  No spinal stenosis.  No disc protrusion.  L5-S1:  Tiny bulge of the disc into the left lateral recess. Hypertrophy of the facet joints and ligamenta flava narrows the left lateral recess with osteophyte immediately adjacent to the left S1 nerve root sleeve without neural impingement.  IMPRESSION:  1.  Severe spinal stenosis at L2-3 with broad-based soft disc protrusion  extending into the right neural foramen compressing the right L2 nerve in the neural foramen. 2.  Abnormal appearance of the disc space and endplates at L2-3.  I suspect this is chronic degenerative disc disease but there is a tiny amount of fluid in the disc space as well as a subtle edema in the vertebral bodies which could represent a low grade infection. However, there is no paraspinal soft tissue inflammation.  The patient does not have an elevated white blood count.  If there is clinical concern for infection , tissue sampling of the disc space could be performed. 3.  Severe right foraminal stenosis at L4-5 compressing the right L4 nerve.  This is felt to be chronic.   Original Report Authenticated By: Francene Boyers, M.D.      Assessment/Plan: 79 YO female presenting to ED with BP 286/74, right LE pain and speech induced myoclonus. Although likely not seizure activity will order EEG.  Due to elevated BP on admission will also order MRI to evaluate for hypertensive CVA.   Recommendations: 1) MRI brain without contrast to evaluate for CVA 2) EEG  3) 250 Depakote PO Q 8 hours if LFT functions are normal for symptomatic treatment of myoclonus.     Felicie Morn PA-C [facility] [phone]  I personally participated in this patient's evaluation and management and improved the above management recommendations.  Venetia Maxon M.D. [facility] [phone]  03/21/2012, 12:09 PM

## 2012-03-21 NOTE — Progress Notes (Signed)
L;Pt went from being westless and sitting up in bed to being quiet. I went up to the bedside and she did not appear to be breathing. I stopped her dialysis Tx and began rinsing her blood back and put her in trendelenburg. Her BP dramatically had dropped to 81/47 with a pulse of 58. Dr. Eliott Nine at the bedside and states not to start her Tx back. Following pt getting blood rinsed back she again aroused and was restless and confused and became hypertensive.

## 2012-03-21 NOTE — Progress Notes (Signed)
Called because patient continually attempting to get out of bed with dialysis needles in place and treatment ongoing.  Patient does not seem to comprehend need to stay in bed.  Requiring one on one nursing to keep patient in bed.  For patient safety reasons during dialysis, soft wrist restraints ordered. Nicole [name]

## 2012-03-22 ENCOUNTER — Encounter (HOSPITAL_COMMUNITY): Payer: Self-pay | Admitting: Radiology

## 2012-03-22 ENCOUNTER — Inpatient Hospital Stay (HOSPITAL_COMMUNITY): Payer: No Typology Code available for payment source

## 2012-03-22 DIAGNOSIS — R259 Unspecified abnormal involuntary movements: Secondary | ICD-10-CM

## 2012-03-22 DIAGNOSIS — R29818 Other symptoms and signs involving the nervous system: Secondary | ICD-10-CM

## 2012-03-22 LAB — CBC
HCT: 28.1 % — ABNORMAL LOW (ref 36.0–46.0)
Hemoglobin: 9.5 g/dL — ABNORMAL LOW (ref 12.0–15.0)
MCH: 32.5 pg (ref 26.0–34.0)
MCHC: 33.8 g/dL (ref 30.0–36.0)
RDW: 14.7 % (ref 11.5–15.5)

## 2012-03-22 LAB — BASIC METABOLIC PANEL
BUN: 21 mg/dL (ref 6–23)
Chloride: 95 mEq/L — ABNORMAL LOW (ref 96–112)
Creatinine, Ser: 8.1 mg/dL — ABNORMAL HIGH (ref 0.50–1.10)
GFR calc Af Amer: 5 mL/min — ABNORMAL LOW (ref >90)
GFR calc non Af Amer: 4 mL/min — ABNORMAL LOW (ref >90)
Glucose, Bld: 97 mg/dL (ref 70–99)
Potassium: 3.9 mEq/L (ref 3.5–5.1)

## 2012-03-22 LAB — BODY FLUID CELL COUNT WITH DIFFERENTIAL: Color, Fluid: UNDETERMINED — AB

## 2012-03-22 LAB — MRSA PCR SCREENING: MRSA by PCR: NEGATIVE

## 2012-03-22 LAB — PROTIME-INR: Prothrombin Time: 13.4 seconds (ref 11.6–15.2)

## 2012-03-22 LAB — APTT: aPTT: 36 seconds (ref 24–37)

## 2012-03-22 MED ORDER — HYDRALAZINE HCL 20 MG/ML IJ SOLN
INTRAMUSCULAR | Status: AC
  Filled 2012-03-22: qty 1

## 2012-03-22 MED ORDER — DEXTROSE 5 % IV SOLN
2.0000 g | INTRAVENOUS | Status: DC
  Administered 2012-03-23 – 2012-03-25 (×2): 2 g via INTRAVENOUS
  Filled 2012-03-22 (×5): qty 2

## 2012-03-22 MED ORDER — MIDAZOLAM HCL 2 MG/2ML IJ SOLN
INTRAMUSCULAR | Status: AC
  Filled 2012-03-22: qty 4

## 2012-03-22 MED ORDER — VANCOMYCIN HCL 10 G IV SOLR
1250.0000 mg | Freq: Once | INTRAVENOUS | Status: AC
  Administered 2012-03-22: 1250 mg via INTRAVENOUS
  Filled 2012-03-22: qty 1250

## 2012-03-22 MED ORDER — VANCOMYCIN HCL 500 MG IV SOLR
500.0000 mg | INTRAVENOUS | Status: DC
  Administered 2012-03-23 – 2012-03-25 (×2): 500 mg via INTRAVENOUS
  Filled 2012-03-22 (×6): qty 500

## 2012-03-22 MED ORDER — CLONIDINE HCL 0.1 MG PO TABS
0.1000 mg | ORAL_TABLET | Freq: Two times a day (BID) | ORAL | Status: DC
  Administered 2012-03-22 – 2012-03-23 (×3): 0.1 mg via ORAL
  Filled 2012-03-22 (×4): qty 1

## 2012-03-22 MED ORDER — FENTANYL CITRATE 0.05 MG/ML IJ SOLN
INTRAMUSCULAR | Status: AC
  Filled 2012-03-22: qty 4

## 2012-03-22 MED ORDER — DEXTROSE 5 % IV SOLN
1.0000 g | Freq: Once | INTRAVENOUS | Status: AC
  Administered 2012-03-22: 1 g via INTRAVENOUS
  Filled 2012-03-22 (×2): qty 1

## 2012-03-22 MED ORDER — SORBITOL 70 % SOLN
30.0000 mL | Freq: Once | Status: AC
  Administered 2012-03-22: 30 mL via ORAL
  Filled 2012-03-22: qty 30

## 2012-03-22 MED ORDER — LEVOTHYROXINE SODIUM 150 MCG PO TABS
150.0000 ug | ORAL_TABLET | Freq: Every day | ORAL | Status: DC
  Administered 2012-03-23 – 2012-03-28 (×6): 150 ug via ORAL
  Filled 2012-03-22 (×8): qty 1

## 2012-03-22 MED ORDER — POLYETHYLENE GLYCOL 3350 17 G PO PACK
34.0000 g | PACK | Freq: Once | ORAL | Status: DC
  Filled 2012-03-22: qty 2

## 2012-03-22 MED ORDER — FENTANYL CITRATE 0.05 MG/ML IJ SOLN
INTRAMUSCULAR | Status: AC | PRN
  Administered 2012-03-22 (×3): 25 ug via INTRAVENOUS

## 2012-03-22 MED ORDER — BISACODYL 10 MG RE SUPP
10.0000 mg | Freq: Once | RECTAL | Status: DC
Start: 2012-03-22 — End: 2012-03-28

## 2012-03-22 MED ORDER — MIDAZOLAM HCL 2 MG/2ML IJ SOLN
INTRAMUSCULAR | Status: AC | PRN
  Administered 2012-03-22 (×3): 1 mg via INTRAVENOUS

## 2012-03-22 NOTE — Progress Notes (Signed)
INFECTIOUS DISEASE PROGRESS NOTE  ID: Nicole [name] is a 79 y.o. female with   Principal Problem:  *Acute back pain Active Problems:  PVD (peripheral vascular disease)  Hypothyroidism  Anemia in chronic kidney disease(285.21)  HTN (hypertension), malignant  CKD (chronic kidney disease) stage V requiring chronic dialysis  ? Osteomyelitis vs diskitis spine  Abnormal involuntary movements  Subjective: Resting quietly  Abtx:  Anti-infectives    None      Medications:  Scheduled:   . amLODipine  10 mg Oral QHS  . aspirin  81 mg Oral Daily  . bisacodyl  10 mg Rectal Once  . calcium acetate  1,334 mg Oral TID WC  . cloNIDine  0.1 mg Oral BID  . darbepoetin (ARANESP) injection - DIALYSIS  25 mcg Intravenous Q Tue-HD  . docusate sodium  100 mg Oral BID  . feeding supplement  1 Container Oral TID BM  . fentaNYL      . heparin  5,000 Units Subcutaneous Q8H  . hydrALAZINE      . levothyroxine  150 mcg Oral QAC breakfast  . lisinopril  40 mg Oral QHS  . meloxicam  15 mg Oral Daily  . midazolam      . multivitamin  1 tablet Oral QHS  . polyethylene glycol  34 g Oral Once  . sodium chloride  3 mL Intravenous Q12H    Objective: Vital signs in last 24 hours: Temp:  [97.5 F (36.4 C)-98.8 F (37.1 C)] 97.5 F (36.4 C) (01/15 1724) Pulse Rate:  [52-75] 55  (01/15 1620) Resp:  [0-27] 20  (01/15 1724) BP: (81-222)/(47-103) 136/78 mmHg (01/15 1724) SpO2:  [94 %-100 %] 98 % (01/15 1724) Weight:  [55.4 kg (122 lb 2.2 oz)-56.5 kg (124 lb 9 oz)] 55.4 kg (122 lb 2.2 oz) (01/14 2222)   General appearance: alert, cooperative and no distress Resp: clear to auscultation bilaterally Cardio: regular rate and rhythm GI: normal findings: bowel sounds normal and soft, non-tender  Lab Results  Basename 03/22/12 0049 03/21/12 1645 03/21/12 0115  WBC 8.0 -- 8.4  HGB 9.5* -- 9.4*  HCT 28.1* -- 27.6*  NA 136 135 --  K 3.9 4.3 --  CL 95* 92* --  CO2 26 24 --  BUN 21 67* --    CREATININE 8.10* 18.13* --  GLU -- -- --   Liver Panel  Basename 03/21/12 1645 03/20/12 2011  PROT -- 7.1  ALBUMIN 2.9* 3.3*  AST -- 12  ALT -- 5  ALKPHOS -- 72  BILITOT -- 0.3  BILIDIR -- 0.2  IBILI -- 0.1*   Sedimentation Rate  Basename 03/21/12 0315  ESRSEDRATE 38*   C-Reactive Protein  Basename 03/21/12 0315  CRP 0.5*    Microbiology: Recent Results (from the past 240 hour(s))  URINE CULTURE     Status: Normal   Collection Time   03/18/12  6:58 AM      Component Value Range Status Comment   Specimen Description URINE, RANDOM   Final    Special Requests NONE   Final    Culture  Setup Time 03/18/2012 15:55   Final    Colony Count >=100,000 COLONIES/ML   Final    Culture     Final    Value: Multiple bacterial morphotypes present, none predominant. Suggest appropriate recollection if clinically indicated.   Report Status 03/19/2012 FINAL   Final   CULTURE, BLOOD (ROUTINE X 2)     Status: Normal (Preliminary result)  Collection Time   03/21/12  3:18 AM      Component Value Range Status Comment   Specimen Description BLOOD LEFT HAND   Final    Special Requests BOTTLES DRAWN AEROBIC ONLY 10CC   Final    Culture  Setup Time 03/21/2012 09:28   Final    Culture     Final    Value:        BLOOD CULTURE RECEIVED NO GROWTH TO DATE CULTURE WILL BE HELD FOR 5 DAYS BEFORE ISSUING A FINAL NEGATIVE REPORT   Report Status PENDING   Incomplete   CULTURE, BLOOD (ROUTINE X 2)     Status: Normal (Preliminary result)   Collection Time   03/21/12  3:20 AM      Component Value Range Status Comment   Specimen Description BLOOD RIGHT ARM   Final    Special Requests BOTTLES DRAWN AEROBIC AND ANAEROBIC 10CC EA   Final    Culture  Setup Time 03/21/2012 09:28   Final    Culture     Final    Value:        BLOOD CULTURE RECEIVED NO GROWTH TO DATE CULTURE WILL BE HELD FOR 5 DAYS BEFORE ISSUING A FINAL NEGATIVE REPORT   Report Status PENDING   Incomplete   MRSA PCR SCREENING     Status:  Normal   Collection Time   03/21/12 10:47 PM      Component Value Range Status Comment   MRSA by PCR NEGATIVE  NEGATIVE Final     Studies/Results: Dg Lumbar Spine Complete  03/20/2012  *RADIOLOGY REPORT*  Clinical Data: Back and leg pain.  LUMBAR SPINE - COMPLETE 4+ VIEW  Comparison: None.  Findings: There is irregular destruction of the endplates about the L2-3 interspace.  There is some sclerosis in the adjacent vertebral bodies.  There is some loss of height of L3 anteriorly.  There is marked narrowing of the L4-5 interspace.  Probable L4 pars defects allow approximately 12 mm anterolisthesis of L4 on L5. Vascular clips in the mid abdomen and over the left hip. Patchy aortic calcifications.  IMPRESSION:  1.  Probable diskitis/osteomyelitis L2-3.  Consider MRI lumbar spine with contrast for further characterization. 2.  Advanced degenerative disc disease L4-5 with grade II anterolisthesis related to L4 pars defects.   Original Report Authenticated By: D. Andria Rhein, MD    Mr Brain Wo Contrast  03/22/2012  *RADIOLOGY REPORT*  Clinical Data: 79 year old female with severe back pain.  End-stage renal disease.  Diskitis. Weakness.  MRI HEAD WITHOUT CONTRAST  Technique:  Multiplanar, multiecho pulse sequences of the brain and surrounding structures were obtained according to standard protocol without intravenous contrast.  Comparison: None.  Findings: Bone marrow signal the skull within normal limits.  There is decreased marrow signal intermittently in the cervical spine which appears to be degenerative/sclerotic.  Multiple chronic small lacunar infarcts in the brainstem. Heterogeneous diffusion signal in stem, no convincing area of restricted diffusion to suggest acute infarct.  Absent of the distal right vertebral artery flow void.  Other Major intracranial vascular flow voids are preserved.  Chronic lacunar infarct anterior right corona radiata, anterior right caudate, extending to the right external  capsule.  Patchy other cerebral white matter T2 and FLAIR hyperintensity. Other deep gray matter nuclei are within normal limits.  No cortical encephalomalacia.  No midline shift, mass effect, or evidence of mass lesion.  No ventriculomegaly. No acute intracranial hemorrhage identified. Negative pituitary and cervicomedullary junction.  Postoperative  changes to the globes.  Minimal paranasal sinus mucosal thickening.  Mastoids are clear.  Scalp soft tissues are within normal limits.  There is some T2 heterogeneity of the left tonsillar pillar noted (series 10 image 10) which may relate to a benign inclusion cyst.  There is also suggestion of heterogeneity of the left parotid gland (image 13) not included on the other imaging planes.  Favor benign intraparotid lymph node.  IMPRESSION: 1. No acute intracranial abnormality. 2.  Chronic small vessel ischemia to the brain stem and cerebral white matter. 3.  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   Original Report Authenticated By: Erskine Speed, M.D.    Mr Lumbar Spine Wo Contrast  03/21/2012  *RADIOLOGY REPORT*  Clinical Data: Low back pain and right leg pain.  Abnormal lumbar radiographs.  MRI LUMBAR SPINE WITHOUT CONTRAST  Technique:  Multiplanar and multiecho pulse sequences of the lumbar spine were obtained without intravenous contrast.  Comparison: Radiographs dated 03/20/2012 and a CT scan dated 12/26/2003  Findings: Tip of the conus is at L1 and appears normal.  Paraspinal soft tissues demonstrate a single tiny stone in the gallbladder wall and bilateral renal atrophy.  The patient has an aortobifemoral graft.   T10-11 through L1-2:  Normal.  L2-3:  There is a broad-based soft disc protrusion severely compressing the thecal sac trapping the nerve roots.  Hypertrophy of the ligamenta flava contributes to the severe spinal stenosis. The disc protrusion extends into both neural foramina, right more than left.  The right L2 nerve is  compressed in the neural foramen.  There is sclerosis and edema in the endplates of L2 and L3 with erosions of the endplates as well.  There is a tiny amount of fluid in the disc space.  There is no paraspinal phlegmon or abscess. There is no epidural abscess.  L3-4:  Normal.  L4-5:  12 mm spondylolisthesis due to bilateral pars defects. Marked disc space narrowing.  Severe right foraminal stenosis.  The right L4 nerve is markedly compressed in the lateral aspect of the neural foramen.  The left L4 nerve appears to exit without severe compression.  No spinal stenosis.  No disc protrusion.  L5-S1:  Tiny bulge of the disc into the left lateral recess. Hypertrophy of the facet joints and ligamenta flava narrows the left lateral recess with osteophyte immediately adjacent to the left S1 nerve root sleeve without neural impingement.  IMPRESSION:  1.  Severe spinal stenosis at L2-3 with broad-based soft disc protrusion extending into the right neural foramen compressing the right L2 nerve in the neural foramen. 2.  Abnormal appearance of the disc space and endplates at L2-3.  I suspect this is chronic degenerative disc disease but there is a tiny amount of fluid in the disc space as well as a subtle edema in the vertebral bodies which could represent a low grade infection. However, there is no paraspinal soft tissue inflammation.  The patient does not have an elevated white blood count.  If there is clinical concern for infection , tissue sampling of the disc space could be performed. 3.  Severe right foraminal stenosis at L4-5 compressing the right L4 nerve.  This is felt to be chronic.   Original Report Authenticated By: Francene Boyers, M.D.      Assessment/Plan: L2/3 Osteomyelitis/Diskitis Neurologic syndrome (? Uremia)  Would- start vanco and ceftaz with HD Await Cx My great appreciation to IR  Total days of antibiotics 0  Johny Sax Infectious Diseases [phone] 03/22/2012, 5:27 PM    LOS: 2 days

## 2012-03-22 NOTE — Progress Notes (Signed)
03/21/12 23:35 Received patient from hemodialysis.Patient alert and oriented to person,disoriented to place and time.Bilateral wrist restraints taken off so patient can go to bathroom,patient refused to have it be put back after using the bathroom.Patient cooperative at this time,easily redirected.NP,Kirby notified of discontinuation of restraints.Bed alarm turned on.Will continue to monitor. Dontrelle Mazon Joselita,RN

## 2012-03-22 NOTE — Progress Notes (Signed)
ANTIBIOTIC CONSULT NOTE - INITIAL  Pharmacy Consult for Vancomycin + Ceftazidime Indication: Empiric osteomyelitis vs diskitis spine  No Known Allergies  Patient Measurements: Height: 5' (152.4 cm) Weight: 122 lb 2.2 oz (55.4 kg) IBW/kg (Calculated) : 45.5   Vital Signs: Temp: 97.5 F (36.4 C) (01/15 1724) Temp src: Oral (01/15 1724) BP: 136/78 mmHg (01/15 1724) Pulse Rate: 55  (01/15 1620) Intake/Output from previous day: 01/14 0701 - 01/15 0700 In: 100 [P.O.:100] Out: 2010 [Stool:1] Intake/Output from this shift: Total I/O In: 360 [P.O.:360] Out: 4 [Stool:4]  Labs:  [facility] 03/22/12 0049 03/21/12 1645 03/21/12 0115 03/20/12 2114  WBC 8.0 -- 8.4 --  HGB 9.5* -- 9.4* 9.9*  PLT 204 -- 179 --  LABCREA -- -- -- --  CREATININE 8.10* 18.13* -- 14.60*   Estimated Creatinine Clearance: 4.5 ml/min (by C-G formula based on Cr of 8.1). No results found for this basename: VANCOTROUGH:2,VANCOPEAK:2,VANCORANDOM:2,GENTTROUGH:2,GENTPEAK:2,GENTRANDOM:2,TOBRATROUGH:2,TOBRAPEAK:2,TOBRARND:2,AMIKACINPEAK:2,AMIKACINTROU:2,AMIKACIN:2, in the last 72 hours   Microbiology: Recent Results (from the past 720 hour(s))  URINE CULTURE     Status: Normal   Collection Time   03/18/12  6:58 AM      Component Value Range Status Comment   Specimen Description URINE, RANDOM   Final    Special Requests NONE   Final    Culture  Setup Time 03/18/2012 15:55   Final    Colony Count >=100,000 COLONIES/ML   Final    Culture     Final    Value: Multiple bacterial morphotypes present, none predominant. Suggest appropriate recollection if clinically indicated.   Report Status 03/19/2012 FINAL   Final   CULTURE, BLOOD (ROUTINE X 2)     Status: Normal (Preliminary result)   Collection Time   03/21/12  3:18 AM      Component Value Range Status Comment   Specimen Description BLOOD LEFT HAND   Final    Special Requests BOTTLES DRAWN AEROBIC ONLY 10CC   Final    Culture  Setup Time 03/21/2012 09:28   Final      Culture     Final    Value:        BLOOD CULTURE RECEIVED NO GROWTH TO DATE CULTURE WILL BE HELD FOR 5 DAYS BEFORE ISSUING A FINAL NEGATIVE REPORT   Report Status PENDING   Incomplete   CULTURE, BLOOD (ROUTINE X 2)     Status: Normal (Preliminary result)   Collection Time   03/21/12  3:20 AM      Component Value Range Status Comment   Specimen Description BLOOD RIGHT ARM   Final    Special Requests BOTTLES DRAWN AEROBIC AND ANAEROBIC 10CC EA   Final    Culture  Setup Time 03/21/2012 09:28   Final    Culture     Final    Value:        BLOOD CULTURE RECEIVED NO GROWTH TO DATE CULTURE WILL BE HELD FOR 5 DAYS BEFORE ISSUING A FINAL NEGATIVE REPORT   Report Status PENDING   Incomplete   MRSA PCR SCREENING     Status: Normal   Collection Time   03/21/12 10:47 PM      Component Value Range Status Comment   MRSA by PCR NEGATIVE  NEGATIVE Final     Medical History: Past Medical History  Diagnosis Date  . Hypertension   . ESRD (end stage renal disease)   . Peripheral vascular disease     revascularization appx. 14 years ago  . Hypothyroidism   . Hiatal  hernia   . Iron deficiency anemia   . Osteoarthritis   . Depression with anxiety   . Pulmonary embolism   . AAA (abdominal aortic aneurysm) 1999    aortic byfem bypass    Assessment: 79 y.o. F with ESRD who presented to the [facility] on 1/13 with back pain. A spinal MRI showed disc space edema and a small amount of fluid concerning for diskitis/osteomyelitis. IR was consulted for aspiration and then was sent for cultures. ID has recommended to start Vancomycin + Ceftazidime while awaiting culture results -- and pharmacy has been consulted to dose this.   The patient receives HD on T/Th/Sat with her last session noted on Tues, 1/14. No doses of antibiotics have been given yet this admission.   Goal of Therapy:  Pre-HD Vancomycin level of 15-25 mcg/ml Proper antibiotics for infection/cultures adjusted for renal/hepatic function   Plan:   1. Vancomycin 1250 mg IV x 1 dose this evening to load followed by Vancomycin 500 mg post HD sessions on T/Th/Sat (starting on 1/16) 2. Ceftazidime 1g IV x 1 dose this evening followed by Ceftazidime 2g post HD sessions on T/Th/Sat (starting on 1/16) 3. Will continue to follow HD schedule/duration, culture results, LOT, and antibiotic de-escalation plans   Georgina Pillion, PharmD, BCPS Clinical Pharmacist Pager: [phone] 03/22/2012 6:11 PM

## 2012-03-22 NOTE — ED Notes (Signed)
MRI complete.  Report called to floor RN.Tolwerated well.

## 2012-03-22 NOTE — ED Notes (Signed)
Patient is being monitored post sedation for MRI scan.

## 2012-03-22 NOTE — H&P (Signed)
HPI: Nicole [name] is an 79 y.o. female who has been admitted with back pain and found to have significant stenosis as well as disc space edema and a small amount of fluid concerning for diskitis/osetomyelitis. IR is requested to perform disc aspiration. She also has ESRD and had to be dialyzed late last pm. Her mental status has normalized. PMHx and meds reviewed as below.  Past Medical History:  Past Medical History  Diagnosis Date  . Hypertension   . ESRD (end stage renal disease)   . Peripheral vascular disease     revascularization appx. 14 years ago  . Hypothyroidism   . Hiatal hernia   . Iron deficiency anemia   . Osteoarthritis   . Depression with anxiety   . Pulmonary embolism   . AAA (abdominal aortic aneurysm) 1999    aortic byfem bypass    Past Surgical History:  Past Surgical History  Procedure Date  . Abdominal surgery about 14 years ago    REVASCULARIZATION  . Ateriovenous graft 04/18/2009    LEFT FOREARM avg  . Av fistula placement, radiocephalic 01/09/10  . Avgg removal 12/02/09    SECONDARY TO INFECTION LT FOREARM  . Av fistula placement, radiocephalic 05/08/10    NONMATURED  . Arteriovenous graft placement 07/24/10    RIGHT FOREARM LOOP  . Dg av dialysis graft declot or 12/05/10, 6/17/123, 09/24/11, 01/26/12    CLOTTED GRAFT  . Angioplasty 12/21/11    of RUE AVG    Family History: No family history on file.  Social History:  reports that she has quit smoking. She does not have any smokeless tobacco history on file. She reports that she does not drink alcohol or use illicit drugs.  Allergies: No Known Allergies  Medications: Medications Prior to Admission  Medication Sig Dispense Refill  . amLODipine (NORVASC) 10 MG tablet Take 10 mg by mouth at bedtime.      Marland Kitchen aspirin 81 MG EC tablet Take 81 mg by mouth daily.      . calcium acetate (PHOSLO) 667 MG capsule Take 2 capsules (1,334 mg total) by mouth 3 (three) times daily with meals.      Marland Kitchen  HYDROcodone-acetaminophen (NORCO/VICODIN) 5-325 MG per tablet Take 1 tablet by mouth every 6 (six) hours as needed for pain.  30 tablet  0  . ibuprofen (ADVIL,MOTRIN) 400 MG tablet Take 1 tablet (400 mg total) by mouth every 6 (six) hours as needed for pain.  30 tablet  0  . levothyroxine (SYNTHROID, LEVOTHROID) 100 MCG tablet Take 1 tablet (100 mcg total) by mouth daily before breakfast.  60 tablet  2  . lisinopril (PRINIVIL,ZESTRIL) 40 MG tablet Take 40 mg by mouth at bedtime.      . meloxicam (MOBIC) 15 MG tablet Take 1 tablet (15 mg total) by mouth daily.  30 tablet  2  . multivitamin (RENA-VIT) TABS tablet Take 1 tablet by mouth at bedtime.      . polyethylene glycol powder (GLYCOLAX/MIRALAX) powder Take 17 g by mouth 2 (two) times daily. Until daily soft stools  OTC  255 g  0    Please HPI for pertinent positives, otherwise complete 10 system ROS negative.  Physical Exam: Blood pressure 199/66, pulse 60, temperature 98.8 F (37.1 C), temperature source Oral, resp. rate 18, height 5' (1.524 m), weight 122 lb 2.2 oz (55.4 kg), SpO2 98.00%. Body mass index is 23.85 kg/(m^2).   General Appearance:  Alert, cooperative, no distress, appears stated age  Head:  Normocephalic, without obvious abnormality, atraumatic  ENT: Unremarkable  Lungs:   Clear to auscultation bilaterally, no w/r/r, respirations unlabored without use of accessory muscles.  Chest Wall:  No tenderness or deformity  Heart:  Regular rate and rhythm, S1, S2 normal, no murmur, rub or gallop. Carotids 2+ without bruit.  Extremities: Extremities normal, atraumatic, no cyanosis or edema  Neurologic: Normal affect, no gross deficits.   Results for orders placed during the hospital encounter of 03/20/12 (from the past 48 hour(s))  TSH     Status: Abnormal   Collection Time   03/20/12  8:11 PM      Component Value Range Comment   TSH 25.304 (*) 0.350 - 4.500 uIU/mL   HEPATIC FUNCTION PANEL     Status: Abnormal   Collection  Time   03/20/12  8:11 PM      Component Value Range Comment   Total Protein 7.1  6.0 - 8.3 g/dL    Albumin 3.3 (*) 3.5 - 5.2 g/dL    AST 12  0 - 37 U/L    ALT 5  0 - 35 U/L    Alkaline Phosphatase 72  39 - 117 U/L    Total Bilirubin 0.3  0.3 - 1.2 mg/dL    Bilirubin, Direct 0.2  0.0 - 0.3 mg/dL    Indirect Bilirubin 0.1 (*) 0.3 - 0.9 mg/dL   POCT I-STAT, CHEM 8     Status: Abnormal   Collection Time   03/20/12  9:14 PM      Component Value Range Comment   Sodium 137  135 - 145 mEq/L    Potassium 4.2  3.5 - 5.1 mEq/L    Chloride 98  96 - 112 mEq/L    BUN 61 (*) 6 - 23 mg/dL    Creatinine, Ser 29.56 (*) 0.50 - 1.10 mg/dL    Glucose, Bld 86  70 - 99 mg/dL    Calcium, Ion 2.13 (*) 1.13 - 1.30 mmol/L    TCO2 27  0 - 100 mmol/L    Hemoglobin 9.9 (*) 12.0 - 15.0 g/dL    HCT 08.6 (*) 57.8 - 46.0 %   CBC     Status: Abnormal   Collection Time   03/21/12  1:15 AM      Component Value Range Comment   WBC 8.4  4.0 - 10.5 K/uL    RBC 2.86 (*) 3.87 - 5.11 MIL/uL    Hemoglobin 9.4 (*) 12.0 - 15.0 g/dL    HCT 46.9 (*) 62.9 - 46.0 %    MCV 96.5  78.0 - 100.0 fL    MCH 32.9  26.0 - 34.0 pg    MCHC 34.1  30.0 - 36.0 g/dL    RDW 52.8  41.3 - 24.4 %    Platelets 179  150 - 400 K/uL   SEDIMENTATION RATE     Status: Abnormal   Collection Time   03/21/12  3:15 AM      Component Value Range Comment   Sed Rate 38 (*) 0 - 22 mm/hr   C-REACTIVE PROTEIN     Status: Abnormal   Collection Time   03/21/12  3:15 AM      Component Value Range Comment   CRP 0.5 (*) <0.60 mg/dL   CULTURE, BLOOD (ROUTINE X 2)     Status: Normal (Preliminary result)   Collection Time   03/21/12  3:18 AM      Component Value Range Comment   Specimen Description BLOOD  LEFT HAND      Special Requests BOTTLES DRAWN AEROBIC ONLY 10CC      Culture  Setup Time 03/21/2012 09:28      Culture        Value:        BLOOD CULTURE RECEIVED NO GROWTH TO DATE CULTURE WILL BE HELD FOR 5 DAYS BEFORE ISSUING A FINAL NEGATIVE REPORT    Report Status PENDING     CULTURE, BLOOD (ROUTINE X 2)     Status: Normal (Preliminary result)   Collection Time   03/21/12  3:20 AM      Component Value Range Comment   Specimen Description BLOOD RIGHT ARM      Special Requests BOTTLES DRAWN AEROBIC AND ANAEROBIC 10CC EA      Culture  Setup Time 03/21/2012 09:28      Culture        Value:        BLOOD CULTURE RECEIVED NO GROWTH TO DATE CULTURE WILL BE HELD FOR 5 DAYS BEFORE ISSUING A FINAL NEGATIVE REPORT   Report Status PENDING     RENAL FUNCTION PANEL     Status: Abnormal   Collection Time   03/21/12  4:45 PM      Component Value Range Comment   Sodium 135  135 - 145 mEq/L    Potassium 4.3  3.5 - 5.1 mEq/L    Chloride 92 (*) 96 - 112 mEq/L    CO2 24  19 - 32 mEq/L    Glucose, Bld 121 (*) 70 - 99 mg/dL    BUN 67 (*) 6 - 23 mg/dL    Creatinine, Ser 40.98 (*) 0.50 - 1.10 mg/dL    Calcium 8.5  8.4 - 11.9 mg/dL    Phosphorus 4.9 (*) 2.3 - 4.6 mg/dL    Albumin 2.9 (*) 3.5 - 5.2 g/dL    GFR calc non Af Amer 2 (*) >90 mL/min    GFR calc Af Amer 2 (*) >90 mL/min   MRSA PCR SCREENING     Status: Normal   Collection Time   03/21/12 10:47 PM      Component Value Range Comment   MRSA by PCR NEGATIVE  NEGATIVE   BASIC METABOLIC PANEL     Status: Abnormal   Collection Time   03/22/12 12:49 AM      Component Value Range Comment   Sodium 136  135 - 145 mEq/L    Potassium 3.9  3.5 - 5.1 mEq/L    Chloride 95 (*) 96 - 112 mEq/L    CO2 26  19 - 32 mEq/L    Glucose, Bld 97  70 - 99 mg/dL    BUN 21  6 - 23 mg/dL DELTA CHECK NOTED   Creatinine, Ser 8.10 (*) 0.50 - 1.10 mg/dL    Calcium 8.3 (*) 8.4 - 10.5 mg/dL    GFR calc non Af Amer 4 (*) >90 mL/min    GFR calc Af Amer 5 (*) >90 mL/min   CBC     Status: Abnormal   Collection Time   03/22/12 12:49 AM      Component Value Range Comment   WBC 8.0  4.0 - 10.5 K/uL    RBC 2.92 (*) 3.87 - 5.11 MIL/uL    Hemoglobin 9.5 (*) 12.0 - 15.0 g/dL    HCT 14.7 (*) 82.9 - 46.0 %    MCV 96.2  78.0 - 100.0  fL    MCH 32.5  26.0 - 34.0  pg    MCHC 33.8  30.0 - 36.0 g/dL    RDW 09.8  11.9 - 14.7 %    Platelets 204  150 - 400 K/uL    Dg Lumbar Spine Complete  03/20/2012  *RADIOLOGY REPORT*  Clinical Data: Back and leg pain.  LUMBAR SPINE - COMPLETE 4+ VIEW  Comparison: None.  Findings: There is irregular destruction of the endplates about the L2-3 interspace.  There is some sclerosis in the adjacent vertebral bodies.  There is some loss of height of L3 anteriorly.  There is marked narrowing of the L4-5 interspace.  Probable L4 pars defects allow approximately 12 mm anterolisthesis of L4 on L5. Vascular clips in the mid abdomen and over the left hip. Patchy aortic calcifications.  IMPRESSION:  1.  Probable diskitis/osteomyelitis L2-3.  Consider MRI lumbar spine with contrast for further characterization. 2.  Advanced degenerative disc disease L4-5 with grade II anterolisthesis related to L4 pars defects.   Original Report Authenticated By: D. Andria Rhein, MD    Mr Lumbar Spine Wo Contrast  03/21/2012  *RADIOLOGY REPORT*  Clinical Data: Low back pain and right leg pain.  Abnormal lumbar radiographs.  MRI LUMBAR SPINE WITHOUT CONTRAST  Technique:  Multiplanar and multiecho pulse sequences of the lumbar spine were obtained without intravenous contrast.  Comparison: Radiographs dated 03/20/2012 and a CT scan dated 12/26/2003  Findings: Tip of the conus is at L1 and appears normal.  Paraspinal soft tissues demonstrate a single tiny stone in the gallbladder wall and bilateral renal atrophy.  The patient has an aortobifemoral graft.   T10-11 through L1-2:  Normal.  L2-3:  There is a broad-based soft disc protrusion severely compressing the thecal sac trapping the nerve roots.  Hypertrophy of the ligamenta flava contributes to the severe spinal stenosis. The disc protrusion extends into both neural foramina, right more than left.  The right L2 nerve is compressed in the neural foramen.  There is sclerosis and edema in the  endplates of L2 and L3 with erosions of the endplates as well.  There is a tiny amount of fluid in the disc space.  There is no paraspinal phlegmon or abscess. There is no epidural abscess.  L3-4:  Normal.  L4-5:  12 mm spondylolisthesis due to bilateral pars defects. Marked disc space narrowing.  Severe right foraminal stenosis.  The right L4 nerve is markedly compressed in the lateral aspect of the neural foramen.  The left L4 nerve appears to exit without severe compression.  No spinal stenosis.  No disc protrusion.  L5-S1:  Tiny bulge of the disc into the left lateral recess. Hypertrophy of the facet joints and ligamenta flava narrows the left lateral recess with osteophyte immediately adjacent to the left S1 nerve root sleeve without neural impingement.  IMPRESSION:  1.  Severe spinal stenosis at L2-3 with broad-based soft disc protrusion extending into the right neural foramen compressing the right L2 nerve in the neural foramen. 2.  Abnormal appearance of the disc space and endplates at L2-3.  I suspect this is chronic degenerative disc disease but there is a tiny amount of fluid in the disc space as well as a subtle edema in the vertebral bodies which could represent a low grade infection. However, there is no paraspinal soft tissue inflammation.  The patient does not have an elevated white blood count.  If there is clinical concern for infection , tissue sampling of the disc space could be performed. 3.  Severe right foraminal stenosis  at L4-5 compressing the right L4 nerve.  This is felt to be chronic.   Original Report Authenticated By: Francene Boyers, M.D.     Assessment/Plan L2-L3 diskitis/osteo D/W NIR MD and agreeable to disc aspiration. Explained procedure to pt in detail, including use of sedation, risks, complications. She ate breakfast at ~0800 and received sub-q heparin about 0645, so will aim for about 2pm today. Ordered stat coags Consent signed in chart  Brayton El  PA-C 03/22/2012, 11:09 AM

## 2012-03-22 NOTE — Progress Notes (Signed)
OT Cancellation Note  Patient Details Name: [name] MRN: [id number] DOB: 10/01/1932   Cancelled Treatment:    Reason Eval/Treat Not Completed: Patient at procedure or test/ unavailable. Will check back as schedule allows. Thank you.  Glendale Chard, OTR/L Pager: [phone] 03/22/2012    [name] 03/22/2012, 2:40 PM

## 2012-03-22 NOTE — Procedures (Signed)
S/P L2- L3 fluoro guided disc aspiration.

## 2012-03-22 NOTE — Progress Notes (Signed)
[facility] Progress Note   Nicole [name]:[id number] DOB: [birth year] DOA: 03/20/2012 PCP: [name], MD  Brief narrative: 79 year old female patient with chronic kidney disease who presented to the ER because of 2 weeks of constant sharp pain in her lower back primarily on the right side with radiculopathy to the right leg and buttock. Denied any recent injuries fall or trauma. Increased pain on ambulation. Was evaluated in the emergency department on 03/18/2012 for the same but was discharged home. Was taking Vicodin at home without improvement in the pain. In the ER patient had uncontrolled malignant hypertension with highest blood pressure to 86/74 in the setting of severe pain. X-rays including MRI of the lumbar spine revealed question of discitis or osteomyelitis in the L2/L3 region. Patient did require IV labetalol infusion initially to control her blood pressure but she developed bradycardia with heart rates in the 30s so this was discontinued and since that time patient's blood pressure although not well controlled is much better controlled.  Assessment/Plan: Principal Problem:  *Acute back pain/ ? Osteomyelitis vs diskitis spine -Back pain is better but c/o ongoing pain in right leg -Cause of question potential infectious source for the pain infectious disease consultation has been obtained -Patient does not have leukocytosis or fever so we'll defer initiation of antibiotics unless infectious disease service feels this is warranted -Continue supportive care with analgesics -Please note ESR mildly elevated at 38 and CRP low at 0.5 which point less toward an infectious etiology to the patient's back pain - IR aspirated L2-L3 disk space on 1/1/514 - culture and crystals pending  - empiric antibiotics initiated on 03/22/12 by Dr. Ninetta Lights ID    Abnormal involuntary movements -Patient presented to the hospital with uncontrolled hypertension since admission has developed  dyskinetic involuntary movements involving the right side of the face and upper extremities -Neurology has been consulted but the overall consensus is that she has uremia from non compliance dialysis.    PVD (peripheral vascular disease) -Given underlying vascular disease the radiculopathy and pain that occurs with activity could also be a sign of claudication so may benefit from lower extremity arterial Dopplers   HTN (hypertension), malignant -Diastolic blood pressure uncontrolled prior to HD. -Maximize pain control -Normally dialyzes on Tuesdays Thursdays and Saturdays  -Currently on amlodipine, lisinopril for blood pressure-if after dialysis blood pressure is not improved may need to adjust medications up or any medications for hypertension   CKD (chronic kidney disease) stage V requiring chronic dialysis -Dialyzes Tuesday Thursday Saturday, will make sure nephrology aware of patient's admission so can have dialysis today   Hypothyroidism TSH very elevated -Continue Synthroid but increase dose to 150 mcg daily   Anemia in chronic kidney disease(285.21) -Hemoglobin stable  Constipation  Started LOCs   DVT prophylaxis: Subcutaneous heparin Code Status: Full Family Communication: Spoke with patient Disposition Plan: home  Consultants: Nephrology Neurology ID Procedures: None  Antibiotics: Vanc Fortaz   HPI/Subjective: C/o constipation   Objective: Blood pressure 136/78, pulse 55, temperature 97.5 F (36.4 C), temperature source Oral, resp. rate 20, height 5' (1.524 m), weight 55.4 kg (122 lb 2.2 oz), SpO2 98.00%.  Intake/Output Summary (Last 24 hours) at 03/22/12 1827 Last data filed at 03/22/12 1330  Gross per 24 hour  Intake    460 ml  Output   2014 ml  Net  -1554 ml     Exam: axox3  CVS: RRR RS; CTAB Abdomen : soft, nt  Data Reviewed: Basic Metabolic Panel:  Lab 03/22/12  1610 03/21/12 1645 03/20/12 2114  NA 136 135 137  K 3.9 4.3 4.2  CL 95*  92* 98  CO2 26 24 --  GLUCOSE 97 121* 86  BUN 21 67* 61*  CREATININE 8.10* 18.13* 14.60*  CALCIUM 8.3* 8.5 --  MG -- -- --  PHOS -- 4.9* --   Liver Function Tests:  Lab 03/21/12 1645 03/20/12 2011  AST -- 12  ALT -- 5  ALKPHOS -- 72  BILITOT -- 0.3  PROT -- 7.1  ALBUMIN 2.9* 3.3*   No results found for this basename: LIPASE:5,AMYLASE:5 in the last 168 hours No results found for this basename: AMMONIA:5 in the last 168 hours CBC:  Lab 03/22/12 0049 03/21/12 0115 03/20/12 2114  WBC 8.0 8.4 --  NEUTROABS -- -- --  HGB 9.5* 9.4* 9.9*  HCT 28.1* 27.6* 29.0*  MCV 96.2 96.5 --  PLT 204 179 --   Cardiac Enzymes: No results found for this basename: CKTOTAL:5,CKMB:5,CKMBINDEX:5,TROPONINI:5 in the last 168 hours BNP (last 3 results) No results found for this basename: PROBNP:3 in the last 8760 hours CBG: No results found for this basename: GLUCAP:5 in the last 168 hours  Recent Results (from the past 240 hour(s))  URINE CULTURE     Status: Normal   Collection Time   03/18/12  6:58 AM      Component Value Range Status Comment   Specimen Description URINE, RANDOM   Final    Special Requests NONE   Final    Culture  Setup Time 03/18/2012 15:55   Final    Colony Count >=100,000 COLONIES/ML   Final    Culture     Final    Value: Multiple bacterial morphotypes present, none predominant. Suggest appropriate recollection if clinically indicated.   Report Status 03/19/2012 FINAL   Final   CULTURE, BLOOD (ROUTINE X 2)     Status: Normal (Preliminary result)   Collection Time   03/21/12  3:18 AM      Component Value Range Status Comment   Specimen Description BLOOD LEFT HAND   Final    Special Requests BOTTLES DRAWN AEROBIC ONLY 10CC   Final    Culture  Setup Time 03/21/2012 09:28   Final    Culture     Final    Value:        BLOOD CULTURE RECEIVED NO GROWTH TO DATE CULTURE WILL BE HELD FOR 5 DAYS BEFORE ISSUING A FINAL NEGATIVE REPORT   Report Status PENDING   Incomplete     CULTURE, BLOOD (ROUTINE X 2)     Status: Normal (Preliminary result)   Collection Time   03/21/12  3:20 AM      Component Value Range Status Comment   Specimen Description BLOOD RIGHT ARM   Final    Special Requests BOTTLES DRAWN AEROBIC AND ANAEROBIC 10CC EA   Final    Culture  Setup Time 03/21/2012 09:28   Final    Culture     Final    Value:        BLOOD CULTURE RECEIVED NO GROWTH TO DATE CULTURE WILL BE HELD FOR 5 DAYS BEFORE ISSUING A FINAL NEGATIVE REPORT   Report Status PENDING   Incomplete   MRSA PCR SCREENING     Status: Normal   Collection Time   03/21/12 10:47 PM      Component Value Range Status Comment   MRSA by PCR NEGATIVE  NEGATIVE Final      Studies:  Recent x-ray  studies have been reviewed in detail by the Attending Physician  Scheduled    . amLODipine  10 mg Oral QHS  . aspirin  81 mg Oral Daily  . bisacodyl  10 mg Rectal Once  . calcium acetate  1,334 mg Oral TID WC  . cefTAZidime (FORTAZ)  IV  1 g Intravenous Once  . cefTAZidime (FORTAZ)  IV  2 g Intravenous Q T,Th,Sat-1800  . cloNIDine  0.1 mg Oral BID  . darbepoetin (ARANESP) injection - DIALYSIS  25 mcg Intravenous Q Tue-HD  . docusate sodium  100 mg Oral BID  . feeding supplement  1 Container Oral TID BM  . fentaNYL      . heparin  5,000 Units Subcutaneous Q8H  . hydrALAZINE      . levothyroxine  150 mcg Oral QAC breakfast  . lisinopril  40 mg Oral QHS  . meloxicam  15 mg Oral Daily  . midazolam      . multivitamin  1 tablet Oral QHS  . polyethylene glycol  34 g Oral Once  . sodium chloride  3 mL Intravenous Q12H  . vancomycin  1,250 mg Intravenous Once  . vancomycin  500 mg Intravenous Joseph Art   Pager [phone]  On-Call/Text Page:      [name]      password [email or web address]  If 7PM-7AM, please contact night-coverage [email or web address] Password [facility] 03/22/2012, 6:27 PM   LOS: 2 days

## 2012-03-22 NOTE — Progress Notes (Signed)
PT Cancellation Note  Patient Details Name: Nicole [name] MRN: [id number] DOB: [birth year]   Cancelled Treatment:    Reason Eval/Treat Not Completed: Patient at procedure or test/unavailable  Currently in Interventional Radiology;  Will follow-up for PT eval tomorrow;  Van Clines [facility] 03/22/2012, 2:40 PM

## 2012-03-22 NOTE — Progress Notes (Signed)
[facility] Progress Note  Subjective:  Most pressing complaint is desire to have a bowel movement.  Acknowledges that her jerking movements have lessened today, but only when specifically asked. State's she's had a similar incident in the past but never this severe. Elevated SBPs overnight. Afebrile. Objective Filed Vitals:   03/21/12 2222 03/22/12 0040 03/22/12 0521 03/22/12 0730  BP: 206/75 196/76 179/68 199/66  Pulse: 54 52 66 60  Temp: 97.6 F (36.4 C)  98.5 F (36.9 C) 98.8 F (37.1 C)  TempSrc: Oral  Oral   Resp: 18  16 18   Height: 5' (1.524 m)     Weight: 55.4 kg (122 lb 2.2 oz)     SpO2: 99%  97% 98%   Physical Exam General: Resting in bed.  Mild facial twitching/ excessive blinking.  NAD Heart: RRR Lungs: CTA bilaterally.  No wheezes, rales or rhonchi noted. Abdomen: Soft, non-tender, non-distended. Normal BS Extremities: Moves all limbs.  No LE edema. Dialysis Access: RFA  AVG + thrill/bruit  Dialysis Orders: Center: [facility] on TTS - no tx since 03/14/12.  EDW 58 kg HD Bath 2K+/ 2.25 Ca++ Time 4:00 Heparin 6000. Access RFA AVG BFR 400 DFR A1.5  Hectorol 2 mcg IV/HD Epogen 2400 Units IV/HD Venofer none. Has been leaving below EDW at OP center by about 0.5 kg prior to 03/14/12.   Assessment/Plan: 1. Low back pain / possible L2/3 osteo/discitis by xray and MRI- Blood cultures negative x 1 day, ESR 38. CRP 0.5.  ID recommends holding empiric ABX, unless clinical picture changes. IR amenable to disk space aspiration of L2/L3 later this afternoon. 2. Constipation - Sorbitol 70% ordered x 1. 3. ESRD - TTS at [facility]. Myoclonus resolving s/p HD yesterday.  Involuntary movements now confined to mild facial twitches and frequent blinking. Will plan next HD tomorrow 4. Hypertension/volume - Malignant HTN. Most recent BP 199/66 HR in the 60s. On Lisinopril 40mg  and Norvasc 10mg  QHS. Clonidine 0.1 PRN available. Net UF 2kg during yesterday's treatment. Under  EDW by about 2.5  kg, but likely has lost weight. UF 1-2 kg with HD tomorrow as tolerated. 5. Anemia - Hgb 9.5 on OP Epogen. Tsat 69% in Dec. Last Ferritin 800 in October. Aranesp 25 Tuesdays on HD 6.  Metabolic bone disease - Hypercalcemia resolved Ca+ 8.3 (9.2 corrected) Phos 4.9. Last PTH 322.2 in October. Resume 2.25 Ca++ bath and OP Hectorol 2 mcg Q HD. 7. Nutrition - Albumin 2.7. High protein renal diet, multivitamin, Breeze supplement. 8. Hypothyroidism - high TSH, discussed with Dr Lavera Guise, he will address  Scot Jun. Thad Ranger [facility] [phone] pager 03/22/2012,10:39 AM  LOS: 2 days   Patient seen and examined and reviewed with Katherine Mantle..  Patient looks much better today, but still struggling with constipation and back pain. For IR disc space aspiration procedure today. HD tomorrow. Agree with assessment and plan as above.  Vinson Moselle  MD [facility] [phone] pgr    [phone] cell 03/22/2012, 2:14 PM    Additional Objective Labs: Basic Metabolic Panel:  Lab 03/22/12 2952 03/21/12 1645 03/20/12 2114  NA 136 135 137  K 3.9 4.3 4.2  CL 95* 92* 98  CO2 26 24 --  GLUCOSE 97 121* 86  BUN 21 67* 61*  CREATININE 8.10* 18.13* 14.60*  CALCIUM 8.3* 8.5 --  ALB -- -- --  PHOS -- 4.9* --   Liver Function Tests:  Lab 03/21/12 1645 03/20/12 2011  AST --  12  ALT -- 5  ALKPHOS -- 72  BILITOT -- 0.3  PROT -- 7.1  ALBUMIN 2.9* 3.3*   No results found for this basename: LIPASE:3,AMYLASE:3 in the last 168 hours CBC:  Lab 03/22/12 0049 03/21/12 0115 03/20/12 2114  WBC 8.0 8.4 --  NEUTROABS -- -- --  HGB 9.5* 9.4* 9.9*  HCT 28.1* 27.6* 29.0*  MCV 96.2 96.5 --  PLT 204 179 --   Blood Culture    Component Value Date/Time   SDES BLOOD RIGHT ARM 03/21/2012 0320   SPECREQUEST BOTTLES DRAWN AEROBIC AND ANAEROBIC 10CC EA 03/21/2012 0320   CULT        BLOOD CULTURE RECEIVED NO GROWTH TO DATE CULTURE WILL BE HELD FOR 5 DAYS BEFORE ISSUING A FINAL NEGATIVE  REPORT 03/21/2012 0320   REPTSTATUS PENDING 03/21/2012 0320    Studies/Results: Dg Lumbar Spine Complete  03/20/2012  *RADIOLOGY REPORT*  Clinical Data: Back and leg pain.  LUMBAR SPINE - COMPLETE 4+ VIEW  Comparison: None.  Findings: There is irregular destruction of the endplates about the L2-3 interspace.  There is some sclerosis in the adjacent vertebral bodies.  There is some loss of height of L3 anteriorly.  There is marked narrowing of the L4-5 interspace.  Probable L4 pars defects allow approximately 12 mm anterolisthesis of L4 on L5. Vascular clips in the mid abdomen and over the left hip. Patchy aortic calcifications.  IMPRESSION:  1.  Probable diskitis/osteomyelitis L2-3.  Consider MRI lumbar spine with contrast for further characterization. 2.  Advanced degenerative disc disease L4-5 with grade II anterolisthesis related to L4 pars defects.   Original Report Authenticated By: D. Andria Rhein, MD    Mr Lumbar Spine Wo Contrast  03/21/2012  *RADIOLOGY REPORT*  Clinical Data: Low back pain and right leg pain.  Abnormal lumbar radiographs.  MRI LUMBAR SPINE WITHOUT CONTRAST  Technique:  Multiplanar and multiecho pulse sequences of the lumbar spine were obtained without intravenous contrast.  Comparison: Radiographs dated 03/20/2012 and a CT scan dated 12/26/2003  Findings: Tip of the conus is at L1 and appears normal.  Paraspinal soft tissues demonstrate a single tiny stone in the gallbladder wall and bilateral renal atrophy.  The patient has an aortobifemoral graft.   T10-11 through L1-2:  Normal.  L2-3:  There is a broad-based soft disc protrusion severely compressing the thecal sac trapping the nerve roots.  Hypertrophy of the ligamenta flava contributes to the severe spinal stenosis. The disc protrusion extends into both neural foramina, right more than left.  The right L2 nerve is compressed in the neural foramen.  There is sclerosis and edema in the endplates of L2 and L3 with erosions of the  endplates as well.  There is a tiny amount of fluid in the disc space.  There is no paraspinal phlegmon or abscess. There is no epidural abscess.  L3-4:  Normal.  L4-5:  12 mm spondylolisthesis due to bilateral pars defects. Marked disc space narrowing.  Severe right foraminal stenosis.  The right L4 nerve is markedly compressed in the lateral aspect of the neural foramen.  The left L4 nerve appears to exit without severe compression.  No spinal stenosis.  No disc protrusion.  L5-S1:  Tiny bulge of the disc into the left lateral recess. Hypertrophy of the facet joints and ligamenta flava narrows the left lateral recess with osteophyte immediately adjacent to the left S1 nerve root sleeve without neural impingement.  IMPRESSION:  1.  Severe spinal stenosis at L2-3  with broad-based soft disc protrusion extending into the right neural foramen compressing the right L2 nerve in the neural foramen. 2.  Abnormal appearance of the disc space and endplates at L2-3.  I suspect this is chronic degenerative disc disease but there is a tiny amount of fluid in the disc space as well as a subtle edema in the vertebral bodies which could represent a low grade infection. However, there is no paraspinal soft tissue inflammation.  The patient does not have an elevated white blood count.  If there is clinical concern for infection , tissue sampling of the disc space could be performed. 3.  Severe right foraminal stenosis at L4-5 compressing the right L4 nerve.  This is felt to be chronic.   Original Report Authenticated By: Francene Boyers, M.D.    Medications:      . amLODipine  10 mg Oral QHS  . aspirin  81 mg Oral Daily  . calcium acetate  1,334 mg Oral TID WC  . cloNIDine  0.1 mg Oral BID  . darbepoetin (ARANESP) injection - DIALYSIS  25 mcg Intravenous Q Tue-HD  . docusate sodium  100 mg Oral BID  . feeding supplement  1 Container Oral TID BM  . heparin  5,000 Units Subcutaneous Q8H  . levothyroxine  150 mcg Oral QAC  breakfast  . lisinopril  40 mg Oral QHS  . meloxicam  15 mg Oral Daily  . multivitamin  1 tablet Oral QHS  . polyethylene glycol  17 g Oral Daily  . sodium chloride  3 mL Intravenous Q12H

## 2012-03-23 ENCOUNTER — Ambulatory Visit (HOSPITAL_COMMUNITY): Payer: Medicare Other

## 2012-03-23 LAB — RENAL FUNCTION PANEL
Albumin: 2.7 g/dL — ABNORMAL LOW (ref 3.5–5.2)
BUN: 34 mg/dL — ABNORMAL HIGH (ref 6–23)
Calcium: 9 mg/dL (ref 8.4–10.5)
Creatinine, Ser: 11.3 mg/dL — ABNORMAL HIGH (ref 0.50–1.10)
GFR calc non Af Amer: 3 mL/min — ABNORMAL LOW (ref >90)

## 2012-03-23 LAB — CBC
MCH: 31.8 pg (ref 26.0–34.0)
MCHC: 32.6 g/dL (ref 30.0–36.0)
MCV: 97.4 fL (ref 78.0–100.0)
Platelets: 211 10*3/uL (ref 150–400)
RDW: 14.9 % (ref 11.5–15.5)

## 2012-03-23 MED ORDER — CLONIDINE HCL 0.2 MG PO TABS
0.2000 mg | ORAL_TABLET | Freq: Two times a day (BID) | ORAL | Status: DC
  Administered 2012-03-23 – 2012-03-28 (×10): 0.2 mg via ORAL
  Filled 2012-03-23 (×12): qty 1

## 2012-03-23 MED ORDER — POLYETHYLENE GLYCOL 3350 17 G PO PACK
17.0000 g | PACK | Freq: Every day | ORAL | Status: DC
  Administered 2012-03-23 – 2012-03-28 (×5): 17 g via ORAL
  Filled 2012-03-23 (×5): qty 1

## 2012-03-23 NOTE — Procedures (Signed)
I was present at this dialysis session. I have reviewed the session itself and made appropriate changes.   Vinson Moselle, MD [employer] 03/23/2012, 11:09 AM

## 2012-03-23 NOTE — Progress Notes (Signed)
[facility] Progress Note   Nicole [name] RUE:[id number] DOB: 27-Mar-1932 DOA: 03/20/2012 PCP: Nicole Cords, MD  Brief narrative: 79 year old female patient with chronic kidney disease who presented to the ER because of 2 weeks of constant sharp pain in her lower back primarily on the right side with radiculopathy to the right leg and buttock. Denied any recent injuries fall or trauma. Increased pain on ambulation. Was evaluated in the emergency department on 03/18/2012 for the same but was discharged home. Was taking Vicodin at home without improvement in the pain. In the ER patient had uncontrolled malignant hypertension with highest blood pressure to 86/74 in the setting of severe pain. X-rays including MRI of the lumbar spine revealed question of discitis or osteomyelitis in the L2/L3 region. Patient did require IV labetalol infusion initially to control her blood pressure but she developed bradycardia with heart rates in the 30s so this was discontinued and since that time patient's blood pressure although not well controlled is much better controlled.  Assessment/Plan: Principal Problem:  *Acute back pain/ ? Osteomyelitis vs diskitis spine -Back pain is better but c/o ongoing pain in right leg -Cause of question potential infectious source for the pain infectious disease consultation has been obtained -Patient does not have leukocytosis or fever so we'll defer initiation of antibiotics unless infectious disease service feels this is warranted -Continue supportive care with analgesics -Please note ESR mildly elevated at 38 and CRP low at 0.5 which point less toward an infectious etiology to the patient's back pain - IR aspirated L2-L3 disk space on 1/1/514 - culture pending  - empiric antibiotics initiated on 03/22/12 by Dr. Ninetta [name] ID    Abnormal involuntary movements -Patient presented to the hospital with uncontrolled hypertension since admission has developed dyskinetic  involuntary movements involving the right side of the face and upper extremities -Neurology has been consulted but the overall consensus is that she has uremia from non compliance dialysis. - MRI negative for acute stroke     PVD (peripheral vascular disease) -Given underlying vascular disease the radiculopathy and pain that occurs with activity could also be a sign of claudication so may benefit from lower extremity arterial Dopplers   HTN (hypertension), malignant -Diastolic blood pressure uncontrolled prior to HD. -Maximize pain control -Normally dialyzes on Tuesdays Thursdays and Saturdays  -Currently on amlodipine, lisinopril for blood pressure-    CKD (chronic kidney disease) stage V requiring chronic dialysis -Dialyzes Tuesday Thursday Saturday,    Hypothyroidism TSH very elevated -Continue Synthroid but increase dose to 150 mcg daily   Anemia in chronic kidney disease(285.21) -Hemoglobin stable  Constipation  Started LOCs   DVT prophylaxis: Subcutaneous heparin Code Status: Full Family Communication: Spoke with patient Disposition Plan: SNF   Consultants: Nephrology Neurology ID Procedures: Aspirate of L2-L3 disk space   Antibiotics: Vanc1/15 Nicole [name] 1/15  HPI/Subjective: C/o weak all over and back pain   Objective: Blood pressure 192/68, pulse 52, temperature 98.5 F (36.9 C), temperature source Oral, resp. rate 16, height 5' (1.524 m), weight 56.1 kg (123 lb 10.9 oz), SpO2 100.00%.  Intake/Output Summary (Last 24 hours) at 03/23/12 1447 Last data filed at 03/23/12 1003  Gross per 24 hour  Intake    370 ml  Output      0 ml  Net    370 ml     Exam: axox3 CVS: RRR RS; CTAB Abdomen : soft, nt  Data Reviewed: Basic Metabolic Panel:  Lab 03/23/12 1478 03/22/12 0049 03/21/12 1645 03/20/12 2114  NA  138 136 135 137  K 4.2 3.9 4.3 4.2  CL 97 95* 92* 98  CO2 27 26 24  --  GLUCOSE 101* 97 121* 86  BUN 34* 21 67* 61*  CREATININE 11.30* 8.10*  18.13* 14.60*  CALCIUM 9.0 8.3* 8.5 --  MG -- -- -- --  PHOS 4.3 -- 4.9* --   Liver Function Tests:  Lab 03/23/12 0713 03/21/12 1645 03/20/12 2011  AST -- -- 12  ALT -- -- 5  ALKPHOS -- -- 72  BILITOT -- -- 0.3  PROT -- -- 7.1  ALBUMIN 2.7* 2.9* 3.3*   No results found for this basename: LIPASE:5,AMYLASE:5 in the last 168 hours No results found for this basename: AMMONIA:5 in the last 168 hours CBC:  Lab 03/23/12 0713 03/22/12 0049 03/21/12 0115 03/20/12 2114  WBC 7.6 8.0 8.4 --  NEUTROABS -- -- -- --  HGB 7.4* 9.5* 9.4* 9.9*  HCT 22.7* 28.1* 27.6* 29.0*  MCV 97.4 96.2 96.5 --  PLT 211 204 179 --   Cardiac Enzymes: No results found for this basename: CKTOTAL:5,CKMB:5,CKMBINDEX:5,TROPONINI:5 in the last 168 hours BNP (last 3 results) No results found for this basename: PROBNP:3 in the last 8760 hours CBG: No results found for this basename: GLUCAP:5 in the last 168 hours  Recent Results (from the past 240 hour(s))  URINE CULTURE     Status: Normal   Collection Time   03/18/12  6:58 AM      Component Value Range Status Comment   Specimen Description URINE, RANDOM   Final    Special Requests NONE   Final    Culture  Setup Time 03/18/2012 15:55   Final    Colony Count >=100,000 COLONIES/ML   Final    Culture     Final    Value: Multiple bacterial morphotypes present, none predominant. Suggest appropriate recollection if clinically indicated.   Report Status 03/19/2012 FINAL   Final   CULTURE, BLOOD (ROUTINE X 2)     Status: Normal (Preliminary result)   Collection Time   03/21/12  3:18 AM      Component Value Range Status Comment   Specimen Description BLOOD LEFT HAND   Final    Special Requests BOTTLES DRAWN AEROBIC ONLY 10CC   Final    Culture  Setup Time 03/21/2012 09:28   Final    Culture     Final    Value:        BLOOD CULTURE RECEIVED NO GROWTH TO DATE CULTURE WILL BE HELD FOR 5 DAYS BEFORE ISSUING A FINAL NEGATIVE REPORT   Report Status PENDING   Incomplete     CULTURE, BLOOD (ROUTINE X 2)     Status: Normal (Preliminary result)   Collection Time   03/21/12  3:20 AM      Component Value Range Status Comment   Specimen Description BLOOD RIGHT ARM   Final    Special Requests BOTTLES DRAWN AEROBIC AND ANAEROBIC 10CC EA   Final    Culture  Setup Time 03/21/2012 09:28   Final    Culture     Final    Value:        BLOOD CULTURE RECEIVED NO GROWTH TO DATE CULTURE WILL BE HELD FOR 5 DAYS BEFORE ISSUING A FINAL NEGATIVE REPORT   Report Status PENDING   Incomplete   MRSA PCR SCREENING     Status: Normal   Collection Time   03/21/12 10:47 PM      Component Value Range Status  Comment   MRSA by PCR NEGATIVE  NEGATIVE Final   WOUND CULTURE     Status: Normal (Preliminary result)   Collection Time   03/22/12  3:01 PM      Component Value Range Status Comment   Specimen Description WOUND ASPIRATE BACK   Final    Special Requests NONE   Final    Gram Stain     Final    Value: RARE WBC PRESENT, PREDOMINANTLY PMN     NO SQUAMOUS EPITHELIAL CELLS SEEN     NO ORGANISMS SEEN   Culture NO GROWTH 1 DAY   Final    Report Status PENDING   Incomplete      Studies:  Recent x-ray studies have been reviewed in detail by the Attending Physician  Scheduled    . amLODipine  10 mg Oral QHS  . aspirin  81 mg Oral Daily  . bisacodyl  10 mg Rectal Once  . calcium acetate  1,334 mg Oral TID WC  . cefTAZidime (FORTAZ)  IV  2 g Intravenous Q T,Th,Sat-1800  . cloNIDine  0.2 mg Oral BID  . darbepoetin (ARANESP) injection - DIALYSIS  25 mcg Intravenous Q Tue-HD  . docusate sodium  100 mg Oral BID  . feeding supplement  1 Container Oral TID BM  . heparin  5,000 Units Subcutaneous Q8H  . levothyroxine  150 mcg Oral QAC breakfast  . lisinopril  40 mg Oral QHS  . meloxicam  15 mg Oral Daily  . multivitamin  1 tablet Oral QHS  . polyethylene glycol  17 g Oral Daily  . sodium chloride  3 mL Intravenous Q12H  . vancomycin  500 mg Intravenous Joseph Art   Pager [phone]  On-Call/Text Page:      [name]      password [email or web address]  If 7PM-7AM, please contact night-coverage [email or web address] Password [email or web address] 03/23/2012, 2:47 PM   LOS: 3 days

## 2012-03-23 NOTE — Evaluation (Signed)
Physical Therapy Evaluation Patient Details Name: Nicole [name] MRN: [id number] DOB: June 29, 1932 Today's Date: 03/23/2012 Time: 7846-9629 PT Time Calculation (min): 26 min  PT Assessment / Plan / Recommendation Clinical Impression  79 yo adm with low back pain and uremia. Per pt, she was highly functioning PTA--driving herself to dialysis, independent with use of cane. Currently is weak, impulsive, with decr knowledge of use of DME and will benefit from PT to incr safety prior to d/c home where she is alone several hours per day.    PT Assessment  Patient needs continued PT services    Follow Up Recommendations  SNF;Supervision for mobility/OOB         Barriers to Discharge Decreased caregiver support      Equipment Recommendations  Rolling walker with 5" wheels    Recommendations for Other Services OT consult   Frequency Min 3X/week    Precautions / Restrictions Precautions Precautions: Fall   Pertinent Vitals/Pain Pt reported dizziness with ambulation, sat and assessed BP: 169/61      Mobility  Bed Mobility Bed Mobility: Rolling Left;Left Sidelying to Sit;Sitting - Scoot to Edge of Bed;Sit to Supine Rolling Left: 6: Modified independent (Device/Increase time);With rail Left Sidelying to Sit: 4: Min assist;With rails;HOB flat Sitting - Scoot to Edge of Bed: 4: Min guard;With rail Sit to Supine: 5: Supervision;HOB flat Details for Bed Mobility Assistance: struggling to raise torso to sitting and required assist due to weakness Transfers Transfers: Sit to Stand;Stand to Sit Sit to Stand: 4: Min assist;With upper extremity assist;From bed;From toilet;From chair/3-in-1 Stand to Sit: 3: Mod assist;With upper extremity assist;To bed;To chair/3-in-1;To toilet Details for Transfer Assistance: vc for safe use of RW/hand placement (pt tries to pull up on RW with it tipping backwards); as pt approaches seat, she becomes more anxious, pushing RW aside, flexing forward at hips to  "lunge" to grab edge of seat with one hand as she turns her hips to sit on surface.mod assist to guide hips to surface to prevent fall.3rd transfer to bed was better with pt slowing down to listen to instructions Ambulation/Gait Ambulation/Gait Assistance: 3: Mod assist Ambulation Distance (Feet): 25 Feet (12, 5,  7 (seated rests to/from bathroom)) Assistive device: Rolling walker;None Ambulation/Gait Assistance Details: initially Rt HHA to bathroom with pt lunging to hold onto sink with LUE; on exit from bathroom, used RW, however pt remained forward flexed at hips with RW way too far ahead of her with unsafe use; reported she had to sit after 5 ft due to dizziness Gait Pattern: Step-through pattern;Decreased stride length;Right foot flat;Left foot flat;Lateral trunk lean to right;Lateral trunk lean to left;Trunk flexed              PT Diagnosis: Difficulty walking;Generalized weakness  PT Problem List: Decreased strength;Decreased activity tolerance;Decreased balance;Decreased mobility;Decreased cognition;Decreased knowledge of use of DME;Decreased safety awareness PT Treatment Interventions: DME instruction;Gait training;Functional mobility training;Therapeutic activities;Therapeutic exercise;Balance training;Cognitive remediation;Patient/family education   PT Goals Acute Rehab PT Goals PT Goal Formulation: With patient Time For Goal Achievement: 04/06/12 Potential to Achieve Goals: Good Pt will go Supine/Side to Sit: with modified independence;with HOB 0 degrees PT Goal: Supine/Side to Sit - Progress: Goal set today Pt will go Sit to Stand: with supervision;with upper extremity assist PT Goal: Sit to Stand - Progress: Goal set today Pt will go Stand to Sit: with supervision;with upper extremity assist PT Goal: Stand to Sit - Progress: Goal set today Pt will Ambulate: 51 - 150 feet;with supervision;with least restrictive assistive  device PT Goal: Ambulate - Progress: Goal set today Pt  will Perform Home Exercise Program: with supervision, verbal cues required/provided (for strengthening) PT Goal: Perform Home Exercise Program - Progress: Goal set today  Visit Information  Last PT Received On: 03/23/12 Assistance Needed: +2    Subjective Data  Subjective: "I just can't...don't make me sit in that chair" pt tearful; reports feels too weak and dizzy Patient Stated Goal: ultimately go home   Prior Functioning  Home Living Lives With: Son (and daughter-in-law) Available Help at Discharge: Family;Available PRN/intermittently (son works days; his wife works nights) Type of Home: House Home Access: Stairs to enter Secretary/administrator of Steps: 1 Entrance Stairs-Rails: None Home Layout: Two level;Bed/bath upstairs Alternate Level Stairs-Number of Steps: 12 Alternate Level Stairs-Rails: Can reach both Bathroom Shower/Tub: Engineer, manufacturing systems: Standard Bathroom Accessibility: Yes How Accessible: Accessible via walker Home Adaptive Equipment: Straight cane Additional Comments: only uses cane when she goes out Prior Function Level of Independence: Independent with assistive device(s) (cane at times) Able to Take Stairs?: Yes Driving: Yes (drives herself to dialysis) Communication Communication: No difficulties    Cognition  Overall Cognitive Status: Impaired Area of Impairment: Safety/judgement Arousal/Alertness: Lethargic Behavior During Session: Anxious Safety/Judgement: Decreased safety judgement for tasks assessed;Impulsive Safety/Judgement - Other Comments: reaching for toilet (and later for chair) when still 2-3 ft from it, puts her hand on seat and quickly swings her hips around to sit with nearly missing surface each time    Extremity/Trunk Assessment Right Lower Extremity Assessment RLE ROM/Strength/Tone: Deficits RLE ROM/Strength/Tone Deficits: grossly 4/5 Left Lower Extremity Assessment LLE ROM/Strength/Tone: Deficits LLE  ROM/Strength/Tone Deficits: grossly 4/5 Trunk Assessment Trunk Assessment: Kyphotic   Balance Balance Balance Assessed: Yes Static Sitting Balance Static Sitting - Balance Support: Bilateral upper extremity supported;Feet supported Static Sitting - Level of Assistance: 4: Min assist  End of Session PT - End of Session Equipment Utilized During Treatment: Gait belt Activity Tolerance: Patient limited by fatigue;Other (comment) (and dizziness) Patient left: in bed;with call bell/phone within reach;Other (comment) (refused to sit up in chair) Nurse Communication: Mobility status  GP     Trinadee Verhagen 03/23/2012, 2:00 PM  Pager [phone]

## 2012-03-23 NOTE — Progress Notes (Signed)
[facility] Progress Note  Subjective:   Feeling better, but still with some back/leg pain.  Had a bowel movement last evening.  Does not recall having L2/L3 aspirate yesterday but is otherwise oriented.  Objective Filed Vitals:   03/23/12 0703 03/23/12 0730 03/23/12 0800 03/23/12 0830  BP: 192/69   196/68  Pulse: 51   51  Temp:      TempSrc:      Resp: 14 14 16 16   Height:      Weight:      SpO2:       Physical Exam General: Resting in bed.  NAD Heart: RRR Lungs: CTA bilaterally.  No wheezes, rales, rhonchi appreciated Abdomen: Soft, non-tender, non-distended.  Normal BS Extremities: No LE edema Dialysis Access: RFA AVG in use  Dialysis Orders: Center: [facility] on TTS - no tx since 03/14/12.  EDW 58 kg HD Bath 2K+/ 2.25 Ca++ Time 4:00 Heparin 6000. Access RFA AVG BFR 400 DFR A1.5  Hectorol 2 mcg IV/HD Epogen 2400 Units IV/HD Venofer none. Has been leaving below EDW at OP center by about 0.5 kg prior to 03/14/12.   Assessment/Plan: 1. Low back pain / possible L2/3 osteo/discitis by xray and MRI- Blood cultures negative x 2 days, ESR 38. CRP 0.5. L2/L3 aspirate in IR yesterday.  Now on Vanc and Fortaz pending cultures per ID. 2. Constipation - Resolved with Sorbitol.  On Dulcolax BID. 3. ESRD - TTS at [facility]. Myoclonus resolved. 4. Hypertension/volume - Malignant HTN. SBPs still on 190s, HR in the 60s. On Lisinopril 40mg  and Norvasc 10mg  QHS at home and here. Added po clonidine 1/15 at 0.1 mg bid. Will increase to 0.2 bid. Clonidine 0.1 PRN available.  Under EDW,  likely has lost weight, need to challenge volume with HD on Sat. 5. Anemia - Hgb 7.4 from 9.5 yesterday. Denies blood in stool.  No acute blood loss since yesterday. Will repeat CBC and order stool cards x 3. Tsat 69% in Dec. Last Ferritin 800 in October. Aranesp 25 Tuesdays on HD 6. Metabolic bone disease - Hypercalcemia resolved Ca+ 9.0 (10.0 corrected) Phos 4.3. Last PTH 322.2 in October. Resume 2.25 Ca++ bath and  OP Hectorol 2 mcg Q HD. 7. Nutrition - Albumin 2.7. High protein renal diet, multivitamin, Breeze supplement. 8. Hypothyroidism - high TSH, Synthroid dose increased per primary.   Scot Jun. Thad Ranger [facility] [phone] pager 03/23/2012,8:50 AM  LOS: 3 days   Patient seen and examined and reviewed with Claud Kelp, P.A., with additions as indicated. Vinson Moselle  MD [facility] [phone] pgr    [phone] cell 03/23/2012, 11:11 AM    Additional Objective Labs: Basic Metabolic Panel:  Lab 03/23/12 4696 03/22/12 0049 03/21/12 1645  NA 138 136 135  K 4.2 3.9 4.3  CL 97 95* 92*  CO2 27 26 24   GLUCOSE 101* 97 121*  BUN 34* 21 67*  CREATININE 11.30* 8.10* 18.13*  CALCIUM 9.0 8.3* 8.5  ALB -- -- --  PHOS 4.3 -- 4.9*   Liver Function Tests:  Lab 03/23/12 0713 03/21/12 1645 03/20/12 2011  AST -- -- 12  ALT -- -- 5  ALKPHOS -- -- 72  BILITOT -- -- 0.3  PROT -- -- 7.1  ALBUMIN 2.7* 2.9* 3.3*   CBC:  Lab 03/23/12 0713 03/22/12 0049 03/21/12 0115  WBC 7.6 8.0 8.4  NEUTROABS -- -- --  HGB 7.4* 9.5* 9.4*  HCT 22.7* 28.1* 27.6*  MCV 97.4 96.2 96.5  PLT 211 204 179   Blood Culture    Component Value Date/Time   SDES WOUND ASPIRATE BACK 03/22/2012 1501   SPECREQUEST NONE 03/22/2012 1501   CULT NO GROWTH 1 DAY 03/22/2012 1501   REPTSTATUS PENDING 03/22/2012 1501    Studies/Results: Mr Brain Wo Contrast  03/22/2012  *RADIOLOGY REPORT*  Clinical Data: 79 year old female with severe back pain.  End-stage renal disease.  Diskitis. Weakness.  MRI HEAD WITHOUT CONTRAST  Technique:  Multiplanar, multiecho pulse sequences of the brain and surrounding structures were obtained according to standard protocol without intravenous contrast.  Comparison: None.  Findings: Bone marrow signal the skull within normal limits.  There is decreased marrow signal intermittently in the cervical spine which appears to be degenerative/sclerotic.  Multiple chronic small  lacunar infarcts in the brainstem. Heterogeneous diffusion signal in stem, no convincing area of restricted diffusion to suggest acute infarct.  Absent of the distal right vertebral artery flow void.  Other Major intracranial vascular flow voids are preserved.  Chronic lacunar infarct anterior right corona radiata, anterior right caudate, extending to the right external capsule.  Patchy other cerebral white matter T2 and FLAIR hyperintensity. Other deep gray matter nuclei are within normal limits.  No cortical encephalomalacia.  No midline shift, mass effect, or evidence of mass lesion.  No ventriculomegaly. No acute intracranial hemorrhage identified. Negative pituitary and cervicomedullary junction.  Postoperative changes to the globes.  Minimal paranasal sinus mucosal thickening.  Mastoids are clear.  Scalp soft tissues are within normal limits.  There is some T2 heterogeneity of the left tonsillar pillar noted (series 10 image 10) which may relate to a benign inclusion cyst.  There is also suggestion of heterogeneity of the left parotid gland (image 13) not included on the other imaging planes.  Favor benign intraparotid lymph node.  IMPRESSION: 1. No acute intracranial abnormality. 2.  Chronic small vessel ischemia to the brain stem and cerebral white matter. 3.  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   Original Report Authenticated By: Erskine Speed, M.D.    Medications:      . amLODipine  10 mg Oral QHS  . aspirin  81 mg Oral Daily  . bisacodyl  10 mg Rectal Once  . calcium acetate  1,334 mg Oral TID WC  . cefTAZidime (FORTAZ)  IV  2 g Intravenous Q T,Th,Sat-1800  . cloNIDine  0.1 mg Oral BID  . darbepoetin (ARANESP) injection - DIALYSIS  25 mcg Intravenous Q Tue-HD  . docusate sodium  100 mg Oral BID  . feeding supplement  1 Container Oral TID BM  . heparin  5,000 Units Subcutaneous Q8H  . levothyroxine  150 mcg Oral QAC breakfast  . lisinopril  40 mg Oral QHS  .  meloxicam  15 mg Oral Daily  . multivitamin  1 tablet Oral QHS  . polyethylene glycol  34 g Oral Once  . sodium chloride  3 mL Intravenous Q12H  . vancomycin  500 mg Intravenous Q T,Th,Sa-HD

## 2012-03-23 NOTE — Evaluation (Signed)
Occupational Therapy Evaluation Patient Details Name: Nicole [name] MRN: [id number] DOB: Mar 18, 1932 Today's Date: 03/23/2012 Time: 4098-1191 OT Time Calculation (min): 13 min  OT Assessment / Plan / Recommendation Clinical Impression  This 79 y.o. female admitted with low back pain and uremia.  Pt with questionable osteomyelitis/diskitis L3-4 with work up underway.  OT eval very limited due to pain, and pt. becoming highly anxious with onset of pain, and insisted on returning to supine.  Spoke with PT re: pt performance with her, and agree with recommendation for SNF.  Attempted to broach subject with pt, but she reports she can't think about that right now.  Will continue to follow her acutely to increase safety and independence with BADLs    OT Assessment  Patient needs continued OT Services    Follow Up Recommendations  SNF;Supervision/Assistance - 24 hour    Barriers to Discharge Decreased caregiver support    Equipment Recommendations  None recommended by OT    Recommendations for Other Services    Frequency  Min 2X/week    Precautions / Restrictions Precautions Precautions: Fall Restrictions Weight Bearing Restrictions: No       ADL  Eating/Feeding: Independent Where Assessed - Eating/Feeding: Bed level Grooming: Wash/dry hands;Wash/dry face;Teeth care;Brushing hair;Set up Where Assessed - Grooming: Unsupported sitting Upper Body Bathing: Set up;Supervision/safety Where Assessed - Upper Body Bathing: Unsupported sitting;Supine, head of bed up Lower Body Bathing: Moderate assistance Where Assessed - Lower Body Bathing: Supported sit to stand Upper Body Dressing: Set up Where Assessed - Upper Body Dressing: Unsupported sitting Lower Body Dressing: Moderate assistance Where Assessed - Lower Body Dressing: Supported sit to stand ADL Comments: Pt only willing to participate in limited eval. Pt sat EOB, donned socks then started flailing arms complaining of pain and  insisting she had to lie down.  Pt. leaned forward to doff socks before lying down.  She would not discuss disposition with OT.  Per PT notes, pt was very unsteady when up    OT Diagnosis: Generalized weakness;Acute pain;Cognitive deficits  OT Problem List: Decreased strength;Decreased activity tolerance;Impaired balance (sitting and/or standing);Decreased cognition;Decreased safety awareness;Decreased knowledge of use of DME or AE;Pain OT Treatment Interventions: Self-care/ADL training;DME and/or AE instruction;Therapeutic activities;Patient/family education;Balance training   OT Goals Acute Rehab OT Goals OT Goal Formulation: With patient Time For Goal Achievement: 04/06/12 Potential to Achieve Goals: Good ADL Goals Pt Will Perform Grooming: with supervision;Standing at sink ADL Goal: Grooming - Progress: Goal set today Pt Will Perform Lower Body Bathing: with supervision;Sit to stand from chair;Sit to stand from bed ADL Goal: Lower Body Bathing - Progress: Goal set today Pt Will Perform Lower Body Dressing: with supervision;Sit to stand from chair;Sit to stand from bed ADL Goal: Lower Body Dressing - Progress: Goal set today Pt Will Transfer to Toilet: with supervision;Ambulation;Comfort height toilet ADL Goal: Toilet Transfer - Progress: Goal set today Pt Will Perform Toileting - Clothing Manipulation: with supervision;Standing ADL Goal: Toileting - Clothing Manipulation - Progress: Goal set today Pt Will Perform Toileting - Hygiene: with supervision;Sit to stand from 3-in-1/toilet ADL Goal: Toileting - Hygiene - Progress: Goal set today  Visit Information  Last OT Received On: 03/23/12 Assistance Needed: +2    Subjective Data  Subjective: "I've got to lay down!" Patient Stated Goal: To get rid of the pain   Prior Functioning     Home Living Lives With: Son (and dtr in law) Available Help at Discharge: Available 24 hours/day (Pt providing inconsistent info to PT and  OT) Type of Home: House Home Access: Stairs to enter [employer] of Steps: 1 Entrance Stairs-Rails: None Home Layout: Two level;Bed/bath upstairs Alternate Level Stairs-Number of Steps: 12 Alternate Level Stairs-Rails: Can reach both Bathroom Shower/Tub: Engineer, manufacturing systems: Standard Bathroom Accessibility: Yes How Accessible: Accessible via walker Home Adaptive Equipment: Straight cane Additional Comments: only uses cane when she goes out.  She reports to OT that son works days, and dtr in Social worker works nights, and she has essentially 24 hour assist.  However, she informed PT that she stays alone sometimes for hours at a time Prior Function Level of Independence: Independent with assistive device(s) (cane at times) Able to Take Stairs?: Yes Driving: Yes Vocation: Retired Comments: Pt reports she sponge bathes Communication Communication: No difficulties Dominant Hand: Right         Vision/Perception Vision - Assessment Vision Assessment: Vision not tested   Cognition  Overall Cognitive Status: Impaired Area of Impairment: Safety/judgement Arousal/Alertness: Awake/alert Behavior During Session: Anxious Safety/Judgement: Decreased safety judgement for tasks assessed;Impulsive Safety/Judgement - Other Comments: Pt becomes very anxious with activity and and starts to flail with onset of pain.  Difficult to re-direct her    Extremity/Trunk Assessment Right Upper Extremity Assessment RUE ROM/Strength/Tone: Within functional levels RUE Coordination: WFL - gross/fine motor Left Upper Extremity Assessment LUE ROM/Strength/Tone: Within functional levels LUE Coordination: WFL - gross/fine motor Right Lower Extremity Assessment RLE ROM/Strength/Tone: Deficits RLE ROM/Strength/Tone Deficits: grossly 4/5 Left Lower Extremity Assessment LLE ROM/Strength/Tone: Deficits LLE ROM/Strength/Tone Deficits: grossly 4/5 Trunk Assessment Trunk Assessment: Kyphotic      Mobility Bed Mobility Bed Mobility: Left Sidelying to Sit;Sitting - Scoot to Edge of Bed;Sit to Sidelying Left Rolling Left: 6: Modified independent (Device/Increase time);With rail Left Sidelying to Sit: 6: Modified independent (Device/Increase time);With rails Sitting - Scoot to Edge of Bed: 5: Supervision Sit to Supine: 6: Modified independent (Device/Increase time);With rail Details for Bed Mobility Assistance: pt with no difficulty with bed mobility Transfers Sit to Stand: 4: Min assist;With upper extremity assist;From bed;From toilet;From chair/3-in-1 Stand to Sit: 3: Mod assist;With upper extremity assist;To bed;To chair/3-in-1;To toilet Details for Transfer Assistance: vc for safe use of RW/hand placement (pt tries to pull up on RW with it tipping backwards); as pt approaches seat, she becomes more anxious, pushing RW aside, flexing forward at hips to "lunge" to grab edge of seat with one hand as she turns her hips to sit on surface.mod assist to guide hips to surface to prevent fall.3rd transfer to bed was better with pt slowing down to listen to instructions     Shoulder Instructions     Exercise     Balance Balance Balance Assessed: Yes Static Sitting Balance Static Sitting - Balance Support: No upper extremity supported;Feet supported Static Sitting - Level of Assistance: 5: Stand by assistance Static Sitting - Comment/# of Minutes: 4 Dynamic Sitting Balance Dynamic Sitting - Balance Support: No upper extremity supported;Feet supported Dynamic Sitting - Level of Assistance: 5: Stand by assistance Dynamic Sitting Balance - Compensations: 4 Dynamic Sitting - Balance Activities: Other (comment) (donning/doffing socks)   End of Session OT - End of Session Activity Tolerance: Patient limited by pain Patient left: in bed;with call bell/phone within reach;with bed alarm set  GO     Nicole [name] 03/23/2012, 3:37 PM

## 2012-03-24 ENCOUNTER — Inpatient Hospital Stay (HOSPITAL_COMMUNITY): Payer: No Typology Code available for payment source

## 2012-03-24 DIAGNOSIS — M519 Unspecified thoracic, thoracolumbar and lumbosacral intervertebral disc disorder: Secondary | ICD-10-CM

## 2012-03-24 LAB — WOUND CULTURE

## 2012-03-24 NOTE — Progress Notes (Signed)
Physical Therapy Treatment Patient Details Name: Nicole [name] MRN: [id number] DOB: 14-Jun-1932 Today's Date: 03/24/2012 Time: 2956-2130 PT Time Calculation (min): 23 min  PT Assessment / Plan / Recommendation Comments on Treatment Session  Pt's participation in PT significantly limited today due to lethargy/fatigue.  Only able to do exercises with assist and too lethargic for OOB.      Follow Up Recommendations  SNF;Supervision for mobility/OOB     Does the patient have the potential to tolerate intense rehabilitation     Barriers to Discharge        Equipment Recommendations  Rolling walker with 5" wheels    Recommendations for Other Services    Frequency Min 3X/week   Plan Discharge plan remains appropriate    Precautions / Restrictions Precautions Precautions: Fall Restrictions Weight Bearing Restrictions: No   Pertinent Vitals/Pain No c/o pain, no obvious distress    Mobility  Bed Mobility Bed Mobility: Not assessed (pt. declined) Transfers Transfers: Not assessed (pt. declined) Ambulation/Gait Ambulation/Gait Assistance: Not tested (comment) (pt. dcelined)    Exercises General Exercises - Lower Extremity Ankle Circles/Pumps: AAROM;Both;10 reps;Supine Short Arc Quad: AAROM;Both;Supine Heel Slides: AAROM;Both;10 reps;Supine Hip ABduction/ADduction: AAROM;Both;10 reps;Supine   PT Diagnosis:    PT Problem List:   PT Treatment Interventions:     PT Goals Acute Rehab PT Goals PT Goal: Perform Home Exercise Program - Progress: Progressing toward goal  Visit Information  Last PT Received On: 03/24/12 Assistance Needed: +2    Subjective Data  Subjective: Pt. reports feeling bad, does not want to eat her left over lunch tray.  She declined OOB but agrees to in bed exercises.   Cognition  Overall Cognitive Status: Impaired Area of Impairment: Awareness of deficits;Problem solving Arousal/Alertness: Lethargic Orientation Level: Other (comment) (too lethargic  to determine) Behavior During Session: Lethargic    Balance     End of Session PT - End of Session Activity Tolerance: Patient limited by fatigue;Other (comment) (limited by lethargy)   GP     Ferman Hamming 03/24/2012, 3:53 PM Weldon Picking PT Acute Rehab Services [phone] [facility] [phone]

## 2012-03-24 NOTE — Progress Notes (Signed)
ANTIBIOTIC CONSULT NOTE - FOLLOW UP  Pharmacy Consult for Vancomycin + Ceftazidime Indication: Empiric osteomyelitis vs diskitis spine  No Known Allergies  Patient Measurements: Height: 5' (152.4 cm) Weight: 124 lb 1.9 oz (56.3 kg) IBW/kg (Calculated) : 45.5   Vital Signs: Temp: 98.4 F (36.9 C) (01/17 1019) Temp src: Oral (01/17 1019) BP: 151/77 mmHg (01/17 1019) Pulse Rate: 44  (01/17 1019) Intake/Output from previous day: 01/16 0701 - 01/17 0700 In: 710 [P.O.:660; IV Piggyback:50] Out: 0  Intake/Output from this shift: Total I/O In: 60 [P.O.:60] Out: -   Labs:  Basename 03/23/12 0713 03/22/12 0049 03/21/12 1645  WBC 7.6 8.0 --  HGB 7.4* 9.5* --  PLT 211 204 --  LABCREA -- -- --  CREATININE 11.30* 8.10* 18.13*   Estimated Creatinine Clearance: 3.3 ml/min (by C-G formula based on Cr of 11.3). No results found for this basename: VANCOTROUGH:2,VANCOPEAK:2,VANCORANDOM:2,GENTTROUGH:2,GENTPEAK:2,GENTRANDOM:2,TOBRATROUGH:2,TOBRAPEAK:2,TOBRARND:2,AMIKACINPEAK:2,AMIKACINTROU:2,AMIKACIN:2, in the last 72 hours   Microbiology: Recent Results (from the past 720 hour(s))  URINE CULTURE     Status: Normal   Collection Time   03/18/12  6:58 AM      Component Value Range Status Comment   Specimen Description URINE, RANDOM   Final    Special Requests NONE   Final    Culture  Setup Time 03/18/2012 15:55   Final    Colony Count >=100,000 COLONIES/ML   Final    Culture     Final    Value: Multiple bacterial morphotypes present, none predominant. Suggest appropriate recollection if clinically indicated.   Report Status 03/19/2012 FINAL   Final   CULTURE, BLOOD (ROUTINE X 2)     Status: Normal (Preliminary result)   Collection Time   03/21/12  3:18 AM      Component Value Range Status Comment   Specimen Description BLOOD LEFT HAND   Final    Special Requests BOTTLES DRAWN AEROBIC ONLY 10CC   Final    Culture  Setup Time 03/21/2012 09:28   Final    Culture     Final    Value:         BLOOD CULTURE RECEIVED NO GROWTH TO DATE CULTURE WILL BE HELD FOR 5 DAYS BEFORE ISSUING A FINAL NEGATIVE REPORT   Report Status PENDING   Incomplete   CULTURE, BLOOD (ROUTINE X 2)     Status: Normal (Preliminary result)   Collection Time   03/21/12  3:20 AM      Component Value Range Status Comment   Specimen Description BLOOD RIGHT ARM   Final    Special Requests BOTTLES DRAWN AEROBIC AND ANAEROBIC 10CC EA   Final    Culture  Setup Time 03/21/2012 09:28   Final    Culture     Final    Value:        BLOOD CULTURE RECEIVED NO GROWTH TO DATE CULTURE WILL BE HELD FOR 5 DAYS BEFORE ISSUING A FINAL NEGATIVE REPORT   Report Status PENDING   Incomplete   MRSA PCR SCREENING     Status: Normal   Collection Time   03/21/12 10:47 PM      Component Value Range Status Comment   MRSA by PCR NEGATIVE  NEGATIVE Final   WOUND CULTURE     Status: Normal   Collection Time   03/22/12  3:01 PM      Component Value Range Status Comment   Specimen Description WOUND ASPIRATE BACK   Final    Special Requests NONE   Final  Gram Stain     Final    Value: RARE WBC PRESENT, PREDOMINANTLY PMN     NO SQUAMOUS EPITHELIAL CELLS SEEN     NO ORGANISMS SEEN   Culture NO GROWTH 2 DAYS   Final    Report Status 03/24/2012 FINAL   Final     Anti-infectives     Start     Dose/Rate Route Frequency Ordered Stop   03/23/12 1800   cefTAZidime (FORTAZ) 2 g in dextrose 5 % 50 mL IVPB        2 g 100 mL/hr over 30 Minutes Intravenous Every T-Th-Sa (1800) 03/22/12 1814     03/23/12 1200   vancomycin (VANCOCIN) 500 mg in sodium chloride 0.9 % 100 mL IVPB        500 mg 100 mL/hr over 60 Minutes Intravenous Every T-Th-Sa (Hemodialysis) 03/22/12 1814     03/22/12 1930   vancomycin (VANCOCIN) 1,250 mg in sodium chloride 0.9 % 250 mL IVPB        1,250 mg 166.7 mL/hr over 90 Minutes Intravenous  Once 03/22/12 1813 03/22/12 2149   03/22/12 1845   cefTAZidime (FORTAZ) 1 g in dextrose 5 % 50 mL IVPB        1 g 100 mL/hr over  30 Minutes Intravenous  Once 03/22/12 1813 03/22/12 2017          Assessment: 79 y.o. F with ESRD who presented [facility] the [facility] on 1/13 with back pain. A spinal MRI showed disc space edema and a small amount of fluid concerning for diskitis/osteomyelitis. IR was consulted for aspiration and then was sent for cultures. The patient was started on Vancomycin + Ceftazidime on 03/22/12 while awaiting culture results --thus far have shown no growth.  The patient was loaded with Vancomycin 1250 mg on 1/15 and received an appropriate maintenance dose after dialyzing on 1/16. Doses remain appropriate at this time.    Goal of Therapy:  Pre-HD Vancomycin level of 15-25 mcg/ml  Proper antibiotics for infection/cultures adjusted for renal/hepatic function   Plan:  1. Continue Vancomycin 500 mg post HD sessions on T/Th/Sat 2. Continue Ceftazidime 2g post HD sessions on T/Th/Sat 3. Will continue to follow HD schedule/duration, culture results, LOT, and antibiotic de-escalation plans  Georgina Pillion, PharmD, BCPS Clinical Pharmacist Pager: [phone] 03/24/2012 2:30 PM

## 2012-03-24 NOTE — Progress Notes (Signed)
[facility] Progress Note   ARACELI ARANGO WUJ:[id number] DOB: 1932-05-03 DOA: 03/20/2012 PCP: [name], MD  Brief narrative: 79 year old female patient with chronic kidney disease who presented to the ER because of 2 weeks of constant sharp pain in her lower back primarily on the right side with radiculopathy to the right leg and buttock. Denied any recent injuries fall or trauma. Increased pain on ambulation. Was evaluated in the emergency department on 03/18/2012 for the same but was discharged home. Was taking Vicodin at home without improvement in the pain. In the ER patient had uncontrolled malignant hypertension with highest blood pressure to 86/74 in the setting of severe pain. X-rays including MRI of the lumbar spine revealed question of discitis or osteomyelitis in the L2/L3 region. Patient did require IV labetalol infusion initially to control her blood pressure but she developed bradycardia with heart rates in the 30s so this was discontinued and since that time patient's blood pressure although not well controlled is much better controlled.  Assessment/Plan:   *Acute back pain/ ? Osteomyelitis vs diskitis spine -Back pain is better but c/o ongoing pain in right leg -Cause of question potential infectious source for the pain infectious disease consultation has been obtained -Patient does not have leukocytosis or fever so we'll defer initiation of antibiotics unless infectious disease service feels this is warranted -Continue supportive care with analgesics -Please note ESR mildly elevated at 38 and CRP low at 0.5 which point less toward an infectious etiology to the patient's back pain - IR aspirated L2-L3 disk space on 1/1/514 - culture pending  - empiric antibiotics initiated on 03/22/12 by Dr. Ninetta Lights ID    Abnormal involuntary movements -Patient presented to the hospital with uncontrolled hypertension since admission has developed dyskinetic involuntary movements  involving the right side of the face and upper extremities -Neurology has been consulted but the overall consensus is that she has uremia from non compliance dialysis. - MRI negative for acute stroke - symptoms resolved completely after 2 HD sessions.      PVD (peripheral vascular disease) -Given underlying vascular disease the radiculopathy and pain that occurs with activity could also be a sign of claudication so may benefit from lower extremity arterial Dopplers as outpatient    HTN (hypertension), malignant -Diastolic blood pressure uncontrolled prior to HD. -Normally dialyzes on Tuesdays Thursdays and Saturdays  -Currently on amlodipine, lisinopril for blood pressure- with good control     CKD (chronic kidney disease) stage V requiring chronic dialysis -Dialyzes Tuesday Thursday Saturday,    Hypothyroidism TSH very elevated -Continue Synthroid but increased dose to 150 mcg daily   Anemia in chronic kidney disease(285.21) -Hemoglobin stable  Constipation  Started LOCs   DVT prophylaxis: Subcutaneous heparin Code Status: Full Family Communication: Spoke with patient Disposition Plan: SNF   Consultants: Nephrology Neurology ID Procedures: Aspirate of L2-L3 disk space   Antibiotics: Vanc1/15 [name] 1/15  HPI/Subjective: C/o weak all over  Objective: Blood pressure 142/42, pulse 46, temperature 98.4 F (36.9 C), temperature source Oral, resp. rate 18, height 5' (1.524 m), weight 56.3 kg (124 lb 1.9 oz), SpO2 98.00%.  Intake/Output Summary (Last 24 hours) at 03/24/12 0755 Last data filed at 03/24/12 0700  Gross per 24 hour  Intake    710 ml  Output      0 ml  Net    710 ml   Patient Vitals for the past 24 hrs:  BP Temp Temp src Pulse Resp SpO2 Weight  03/24/12 1019 151/77 mmHg 98.4  F (36.9 C) Oral 44  18  98 % -  03/24/12 0621 142/42 mmHg - - - - - -  03/24/12 0543 - 98.4 F (36.9 C) Oral 46  18  98 % -  03/24/12 0027 130/46 mmHg 98 F (36.7 C) Oral 46   17  97 % -  03/23/12 2109 135/88 mmHg 99.2 F (37.3 C) Oral 70  17  100 % 56.3 kg (124 lb 1.9 oz)  03/23/12 1856 135/60 mmHg 97.5 F (36.4 C) Oral 74  18  98 % -      Exam: axox3 CVS: RRR RS; CTAB Abdomen : soft, nt  Data Reviewed: Basic Metabolic Panel:  Lab 03/23/12 0454 03/22/12 0049 03/21/12 1645 03/20/12 2114  NA 138 136 135 137  K 4.2 3.9 4.3 4.2  CL 97 95* 92* 98  CO2 27 26 24  --  GLUCOSE 101* 97 121* 86  BUN 34* 21 67* 61*  CREATININE 11.30* 8.10* 18.13* 14.60*  CALCIUM 9.0 8.3* 8.5 --  MG -- -- -- --  PHOS 4.3 -- 4.9* --   Liver Function Tests:  Lab 03/23/12 0713 03/21/12 1645 03/20/12 2011  AST -- -- 12  ALT -- -- 5  ALKPHOS -- -- 72  BILITOT -- -- 0.3  PROT -- -- 7.1  ALBUMIN 2.7* 2.9* 3.3*   No results found for this basename: LIPASE:5,AMYLASE:5 in the last 168 hours No results found for this basename: AMMONIA:5 in the last 168 hours CBC:  Lab 03/23/12 0713 03/22/12 0049 03/21/12 0115 03/20/12 2114  WBC 7.6 8.0 8.4 --  NEUTROABS -- -- -- --  HGB 7.4* 9.5* 9.4* 9.9*  HCT 22.7* 28.1* 27.6* 29.0*  MCV 97.4 96.2 96.5 --  PLT 211 204 179 --   Cardiac Enzymes: No results found for this basename: CKTOTAL:5,CKMB:5,CKMBINDEX:5,TROPONINI:5 in the last 168 hours BNP (last 3 results) No results found for this basename: PROBNP:3 in the last 8760 hours CBG: No results found for this basename: GLUCAP:5 in the last 168 hours  Recent Results (from the past 240 hour(s))  URINE CULTURE     Status: Normal   Collection Time   03/18/12  6:58 AM      Component Value Range Status Comment   Specimen Description URINE, RANDOM   Final    Special Requests NONE   Final    Culture  Setup Time 03/18/2012 15:55   Final    Colony Count >=100,000 COLONIES/ML   Final    Culture     Final    Value: Multiple bacterial morphotypes present, none predominant. Suggest appropriate recollection if clinically indicated.   Report Status 03/19/2012 FINAL   Final   CULTURE, BLOOD  (ROUTINE X 2)     Status: Normal (Preliminary result)   Collection Time   03/21/12  3:18 AM      Component Value Range Status Comment   Specimen Description BLOOD LEFT HAND   Final    Special Requests BOTTLES DRAWN AEROBIC ONLY 10CC   Final    Culture  Setup Time 03/21/2012 09:28   Final    Culture     Final    Value:        BLOOD CULTURE RECEIVED NO GROWTH TO DATE CULTURE WILL BE HELD FOR 5 DAYS BEFORE ISSUING A FINAL NEGATIVE REPORT   Report Status PENDING   Incomplete   CULTURE, BLOOD (ROUTINE X 2)     Status: Normal (Preliminary result)   Collection Time   03/21/12  3:20 AM      Component Value Range Status Comment   Specimen Description BLOOD RIGHT ARM   Final    Special Requests BOTTLES DRAWN AEROBIC AND ANAEROBIC 10CC EA   Final    Culture  Setup Time 03/21/2012 09:28   Final    Culture     Final    Value:        BLOOD CULTURE RECEIVED NO GROWTH TO DATE CULTURE WILL BE HELD FOR 5 DAYS BEFORE ISSUING A FINAL NEGATIVE REPORT   Report Status PENDING   Incomplete   MRSA PCR SCREENING     Status: Normal   Collection Time   03/21/12 10:47 PM      Component Value Range Status Comment   MRSA by PCR NEGATIVE  NEGATIVE Final   WOUND CULTURE     Status: Normal   Collection Time   03/22/12  3:01 PM      Component Value Range Status Comment   Specimen Description WOUND ASPIRATE BACK   Final    Special Requests NONE   Final    Gram Stain     Final    Value: RARE WBC PRESENT, PREDOMINANTLY PMN     NO SQUAMOUS EPITHELIAL CELLS SEEN     NO ORGANISMS SEEN   Culture NO GROWTH 2 DAYS   Final    Report Status 03/24/2012 FINAL   Final      Studies:  Recent x-ray studies have been reviewed in detail by the Attending Physician  Scheduled    . amLODipine  10 mg Oral QHS  . aspirin  81 mg Oral Daily  . bisacodyl  10 mg Rectal Once  . calcium acetate  1,334 mg Oral TID WC  . cefTAZidime (FORTAZ)  IV  2 g Intravenous Q T,Th,Sat-1800  . cloNIDine  0.2 mg Oral BID  . darbepoetin (ARANESP)  injection - DIALYSIS  25 mcg Intravenous Q Tue-HD  . docusate sodium  100 mg Oral BID  . feeding supplement  1 Container Oral TID BM  . heparin  5,000 Units Subcutaneous Q8H  . levothyroxine  150 mcg Oral QAC breakfast  . lisinopril  40 mg Oral QHS  . meloxicam  15 mg Oral Daily  . multivitamin  1 tablet Oral QHS  . polyethylene glycol  17 g Oral Daily  . sodium chloride  3 mL Intravenous Q12H  . vancomycin  500 mg Intravenous Joseph Art   Pager [phone]  On-Call/Text Page:      [name]      password [email or web address]  If 7PM-7AM, please contact night-coverage [email or web address] Password [email or web address] 03/24/2012, 7:55 AM   LOS: 4 days

## 2012-03-24 NOTE — Progress Notes (Signed)
INFECTIOUS DISEASE PROGRESS NOTE  ID: Nicole [name] is a 79 y.o. female with   Principal Problem:  *Acute back pain Active Problems:  PVD (peripheral vascular disease)  Hypothyroidism  Anemia in chronic kidney disease(285.21)  HTN (hypertension), malignant  CKD (chronic kidney disease) stage V requiring chronic dialysis  ? Osteomyelitis vs diskitis spine  Abnormal involuntary movements  Subjective: Feeling poorly, no back pain.  Abtx:  Anti-infectives     Start     Dose/Rate Route Frequency Ordered Stop   03/23/12 1800   cefTAZidime (FORTAZ) 2 g in dextrose 5 % 50 mL IVPB        2 g 100 mL/hr over 30 Minutes Intravenous Every T-Th-Sa (1800) 03/22/12 1814     03/23/12 1200   vancomycin (VANCOCIN) 500 mg in sodium chloride 0.9 % 100 mL IVPB        500 mg 100 mL/hr over 60 Minutes Intravenous Every T-Th-Sa (Hemodialysis) 03/22/12 1814     03/22/12 1930   vancomycin (VANCOCIN) 1,250 mg in sodium chloride 0.9 % 250 mL IVPB        1,250 mg 166.7 mL/hr over 90 Minutes Intravenous  Once 03/22/12 1813 03/22/12 2149   03/22/12 1845   cefTAZidime (FORTAZ) 1 g in dextrose 5 % 50 mL IVPB        1 g 100 mL/hr over 30 Minutes Intravenous  Once 03/22/12 1813 03/22/12 2017          Medications:  Scheduled:   . amLODipine  10 mg Oral QHS  . aspirin  81 mg Oral Daily  . bisacodyl  10 mg Rectal Once  . calcium acetate  1,334 mg Oral TID WC  . cefTAZidime (FORTAZ)  IV  2 g Intravenous Q T,Th,Sat-1800  . cloNIDine  0.2 mg Oral BID  . darbepoetin (ARANESP) injection - DIALYSIS  25 mcg Intravenous Q Tue-HD  . docusate sodium  100 mg Oral BID  . feeding supplement  1 Container Oral TID BM  . heparin  5,000 Units Subcutaneous Q8H  . levothyroxine  150 mcg Oral QAC breakfast  . lisinopril  40 mg Oral QHS  . meloxicam  15 mg Oral Daily  . multivitamin  1 tablet Oral QHS  . polyethylene glycol  17 g Oral Daily  . sodium chloride  3 mL Intravenous Q12H  . vancomycin  500 mg  Intravenous Q T,Th,Sa-HD    Objective: Vital signs in last 24 hours: Temp:  [97.5 F (36.4 C)-99.2 F (37.3 C)] 98.4 F (36.9 C) (01/17 1019) Pulse Rate:  [44-74] 44  (01/17 1019) Resp:  [17-18] 18  (01/17 1019) BP: (130-151)/(42-88) 151/77 mmHg (01/17 1019) SpO2:  [97 %-100 %] 98 % (01/17 1019) Weight:  [56.3 kg (124 lb 1.9 oz)] 56.3 kg (124 lb 1.9 oz) (01/16 2109)   General appearance: alert and fatigued  Lab Results  [facility] 03/23/12 0713 03/22/12 0049  WBC 7.6 8.0  HGB 7.4* 9.5*  HCT 22.7* 28.1*  NA 138 136  K 4.2 3.9  CL 97 95*  CO2 27 26  BUN 34* 21  CREATININE 11.30* 8.10*  GLU -- --   Liver Panel  Basename 03/23/12 0713  PROT --  ALBUMIN 2.7*  AST --  ALT --  ALKPHOS --  BILITOT --  BILIDIR --  IBILI --   Sedimentation Rate No results found for this basename: ESRSEDRATE in the last 72 hours C-Reactive Protein No results found for this basename: CRP:2 in the last 72  hours  Microbiology: Recent Results (from the past 240 hour(s))  URINE CULTURE     Status: Normal   Collection Time   03/18/12  6:58 AM      Component Value Range Status Comment   Specimen Description URINE, RANDOM   Final    Special Requests NONE   Final    Culture  Setup Time 03/18/2012 15:55   Final    Colony Count >=100,000 COLONIES/ML   Final    Culture     Final    Value: Multiple bacterial morphotypes present, none predominant. Suggest appropriate recollection if clinically indicated.   Report Status 03/19/2012 FINAL   Final   CULTURE, BLOOD (ROUTINE X 2)     Status: Normal (Preliminary result)   Collection Time   03/21/12  3:18 AM      Component Value Range Status Comment   Specimen Description BLOOD LEFT HAND   Final    Special Requests BOTTLES DRAWN AEROBIC ONLY 10CC   Final    Culture  Setup Time 03/21/2012 09:28   Final    Culture     Final    Value:        BLOOD CULTURE RECEIVED NO GROWTH TO DATE CULTURE WILL BE HELD FOR 5 DAYS BEFORE ISSUING A FINAL NEGATIVE REPORT     Report Status PENDING   Incomplete   CULTURE, BLOOD (ROUTINE X 2)     Status: Normal (Preliminary result)   Collection Time   03/21/12  3:20 AM      Component Value Range Status Comment   Specimen Description BLOOD RIGHT ARM   Final    Special Requests BOTTLES DRAWN AEROBIC AND ANAEROBIC 10CC EA   Final    Culture  Setup Time 03/21/2012 09:28   Final    Culture     Final    Value:        BLOOD CULTURE RECEIVED NO GROWTH TO DATE CULTURE WILL BE HELD FOR 5 DAYS BEFORE ISSUING A FINAL NEGATIVE REPORT   Report Status PENDING   Incomplete   MRSA PCR SCREENING     Status: Normal   Collection Time   03/21/12 10:47 PM      Component Value Range Status Comment   MRSA by PCR NEGATIVE  NEGATIVE Final   WOUND CULTURE     Status: Normal   Collection Time   03/22/12  3:01 PM      Component Value Range Status Comment   Specimen Description WOUND ASPIRATE BACK   Final    Special Requests NONE   Final    Gram Stain     Final    Value: RARE WBC PRESENT, PREDOMINANTLY PMN     NO SQUAMOUS EPITHELIAL CELLS SEEN     NO ORGANISMS SEEN   Culture NO GROWTH 2 DAYS   Final    Report Status 03/24/2012 FINAL   Final     Studies/Results: No results found.   Assessment/Plan: Diskitis/Osteo Day 2 anbx  Would plan for 42 days of anbx with HD Glad to see in ID clinic if needed.  Available if questions  Johny Sax Infectious Diseases [phone] 03/24/2012, 5:25 PM   LOS: 4 days

## 2012-03-24 NOTE — Procedures (Deleted)
I was present at this dialysis session. I have reviewed the session itself and made appropriate changes.   Vinson Moselle, MD [employer] 03/24/2012, 2:01 PM

## 2012-03-24 NOTE — Progress Notes (Signed)
[facility] Progress Note  Subjective:   " I Just don't feel good. I'm just down."  Denies back pain, fatigue, constipation, N/V.   Objective Filed Vitals:   03/23/12 2109 03/24/12 0027 03/24/12 0543 03/24/12 0621  BP: 135/88 130/46  142/42  Pulse: 70 46 46   Temp: 99.2 F (37.3 C) 98 F (36.7 C) 98.4 F (36.9 C)   TempSrc: Oral Oral Oral   Resp: 17 17 18    Height:      Weight: 56.3 kg (124 lb 1.9 oz)     SpO2: 100% 97% 98%    Physical Exam General: Resting in bed, fetal position and covers over head. NAD Heart: RRR Lungs: CTA bilaterally.  No wheezes, rales, or rhonchi appreciated. Abdomen: Soft, non-tender, non-distended. Normal BS Extremities: No LE edema Dialysis Access: RFA AVG with + bruit  Dialysis Orders: Center: [facility] on TTS - no tx since 03/14/12.  EDW 58 kg HD Bath 2K+/ 2.25 Ca++ Time 4:00 Heparin 6000. Access RFA AVG BFR 400 DFR A1.5  Hectorol 2 mcg IV/HD Epogen 2400 Units IV/HD Venofer none. Has been leaving below EDW at OP center by about 0.5 kg prior to 03/14/12.   Assessment/Plan: 1. Back pain/? Lumbar discitis- Blood and aspirate cx's are negative. On abx, ID following 2. Constipation - Resolved with Sorbitol. On Dulcolax BID. 3. ESRD, cont hd TTS 4. Hypertension/volume - added clonidine to labetalol and norvasc regimen. Under EDW, likely has lost weight. Will challenge volume with HD on Sat. 5. Anemia - Hgb 7.4 from 9.5  Denies blood in stool. No acute blood loss since yesterday. Awaiting repeat CBC. Tsat 69% in Dec. Last Ferritin 800 in October. Aranesp 25 Tuesdays on HD. Will order CBC pre hd and transfuse 1 unit for HGB < 8. Tight heparin. 6. Metabolic bone disease - Hypercalcemia resolved Ca+ 9.0 (10.0 corrected) Phos 4.3. Last PTH 322.2 in October. Resume 2.25 Ca++ bath and OP Hectorol 2 mcg Q HD. 7. Nutrition - Albumin 2.7. High protein renal diet, multivitamin, Breeze supplement. 8. Hypothyroidism - high TSH, Synthroid dose increased per  primary. 9. Dispo - Primary may recommend SNF temporarily.  Scot Jun. Thad Ranger [facility] [phone] pager 03/24/2012,10:14 AM  LOS: 4 days   Patient seen and examined and above reviewed.  Agree with assessment and plan as above. Vinson Moselle  MD [facility] [phone] pgr    [phone] cell 03/24/2012, 3:46 PM   Additional Objective Labs: Basic Metabolic Panel:  Lab 03/23/12 2542 03/22/12 0049 03/21/12 1645  NA 138 136 135  K 4.2 3.9 4.3  CL 97 95* 92*  CO2 27 26 24   GLUCOSE 101* 97 121*  BUN 34* 21 67*  CREATININE 11.30* 8.10* 18.13*  CALCIUM 9.0 8.3* 8.5  ALB -- -- --  PHOS 4.3 -- 4.9*   Liver Function Tests:  Lab 03/23/12 0713 03/21/12 1645 03/20/12 2011  AST -- -- 12  ALT -- -- 5  ALKPHOS -- -- 72  BILITOT -- -- 0.3  PROT -- -- 7.1  ALBUMIN 2.7* 2.9* 3.3*   CBC:  Lab 03/23/12 0713 03/22/12 0049 03/21/12 0115  WBC 7.6 8.0 8.4  NEUTROABS -- -- --  HGB 7.4* 9.5* 9.4*  HCT 22.7* 28.1* 27.6*  MCV 97.4 96.2 96.5  PLT 211 204 179   Blood Culture    Component Value Date/Time   SDES WOUND ASPIRATE BACK 03/22/2012 1501   SPECREQUEST NONE 03/22/2012 1501   CULT NO GROWTH 2  DAYS 03/22/2012 1501   REPTSTATUS 03/24/2012 FINAL 03/22/2012 1501    Studies/Results: Mr Brain Wo Contrast  03/22/2012  *RADIOLOGY REPORT*  Clinical Data: 79 year old female with severe back pain.  End-stage renal disease.  Diskitis. Weakness.  MRI HEAD WITHOUT CONTRAST  Technique:  Multiplanar, multiecho pulse sequences of the brain and surrounding structures were obtained according to standard protocol without intravenous contrast.  Comparison: None.  Findings: Bone marrow signal the skull within normal limits.  There is decreased marrow signal intermittently in the cervical spine which appears to be degenerative/sclerotic.  Multiple chronic small lacunar infarcts in the brainstem. Heterogeneous diffusion signal in stem, no convincing area of restricted diffusion  to suggest acute infarct.  Absent of the distal right vertebral artery flow void.  Other Major intracranial vascular flow voids are preserved.  Chronic lacunar infarct anterior right corona radiata, anterior right caudate, extending to the right external capsule.  Patchy other cerebral white matter T2 and FLAIR hyperintensity. Other deep gray matter nuclei are within normal limits.  No cortical encephalomalacia.  No midline shift, mass effect, or evidence of mass lesion.  No ventriculomegaly. No acute intracranial hemorrhage identified. Negative pituitary and cervicomedullary junction.  Postoperative changes to the globes.  Minimal paranasal sinus mucosal thickening.  Mastoids are clear.  Scalp soft tissues are within normal limits.  There is some T2 heterogeneity of the left tonsillar pillar noted (series 10 image 10) which may relate to a benign inclusion cyst.  There is also suggestion of heterogeneity of the left parotid gland (image 13) not included on the other imaging planes.  Favor benign intraparotid lymph node.  IMPRESSION: 1. No acute intracranial abnormality. 2.  Chronic small vessel ischemia to the brain stem and cerebral white matter. 3.  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   Original Report Authenticated By: Erskine Speed, M.D.    Medications:      . amLODipine  10 mg Oral QHS  . aspirin  81 mg Oral Daily  . bisacodyl  10 mg Rectal Once  . calcium acetate  1,334 mg Oral TID WC  . cefTAZidime (FORTAZ)  IV  2 g Intravenous Q T,Th,Sat-1800  . cloNIDine  0.2 mg Oral BID  . darbepoetin (ARANESP) injection - DIALYSIS  25 mcg Intravenous Q Tue-HD  . docusate sodium  100 mg Oral BID  . feeding supplement  1 Container Oral TID BM  . heparin  5,000 Units Subcutaneous Q8H  . levothyroxine  150 mcg Oral QAC breakfast  . lisinopril  40 mg Oral QHS  . meloxicam  15 mg Oral Daily  . multivitamin  1 tablet Oral QHS  . polyethylene glycol  17 g Oral Daily  . sodium  chloride  3 mL Intravenous Q12H  . vancomycin  500 mg Intravenous Q T,Th,Sa-HD

## 2012-03-24 NOTE — Progress Notes (Signed)
EEG completed.

## 2012-03-25 LAB — PREPARE RBC (CROSSMATCH)

## 2012-03-25 LAB — RENAL FUNCTION PANEL
Calcium: 8.9 mg/dL (ref 8.4–10.5)
Creatinine, Ser: 10.66 mg/dL — ABNORMAL HIGH (ref 0.50–1.10)
GFR calc Af Amer: 4 mL/min — ABNORMAL LOW (ref >90)
Glucose, Bld: 163 mg/dL — ABNORMAL HIGH (ref 70–99)
Phosphorus: 4.3 mg/dL (ref 2.3–4.6)
Sodium: 136 mEq/L (ref 135–145)

## 2012-03-25 LAB — CBC
Hemoglobin: 7.1 g/dL — ABNORMAL LOW (ref 12.0–15.0)
MCH: 33.8 pg (ref 26.0–34.0)
MCHC: 34.1 g/dL (ref 30.0–36.0)
MCV: 99 fL (ref 78.0–100.0)
Platelets: 188 10*3/uL (ref 150–400)

## 2012-03-25 LAB — GLUCOSE, CAPILLARY: Glucose-Capillary: 104 mg/dL — ABNORMAL HIGH (ref 70–99)

## 2012-03-25 MED ORDER — HYDROCODONE-ACETAMINOPHEN 5-325 MG PO TABS
ORAL_TABLET | ORAL | Status: AC
  Administered 2012-03-25: 1 via ORAL
  Filled 2012-03-25: qty 1

## 2012-03-25 MED ORDER — HEPARIN SODIUM (PORCINE) 1000 UNIT/ML IJ SOLN
1100.0000 [IU] | Freq: Once | INTRAMUSCULAR | Status: DC
  Filled 2012-03-25: qty 1.1

## 2012-03-25 MED ORDER — DARBEPOETIN ALFA-POLYSORBATE 100 MCG/0.5ML IJ SOLN: 100.0000 ug | INTRAMUSCULAR | Status: DC

## 2012-03-25 NOTE — Progress Notes (Addendum)
[facility] Progress Note  Subjective:   " I Just don't feel good. I'm just down."  Denies back pain, fatigue, constipation, N/V.   Objective Filed Vitals:   03/25/12 1030 03/25/12 1100 03/25/12 1130 03/25/12 1201  BP: 116/49 118/51 123/54 120/51  Pulse: 53 54 51 56  Temp:      TempSrc:      Resp:      Height:      Weight:      SpO2:       Physical Exam General: Resting in bed, fetal position and covers over head. NAD Heart: RRR Lungs: CTA bilaterally.  No wheezes, rales, or rhonchi appreciated. Abdomen: Soft, non-tender, non-distended. Normal BS Extremities: No LE edema Dialysis Access: RFA AVG with + bruit  Dialysis Orders: Center: [facility] on TTS - no tx since 03/14/12.  EDW 58 kg HD Bath 2K+/ 2.25 Ca++ Time 4:00 Heparin 6000. Access RFA AVG BFR 400 DFR A1.5  Hectorol 2 mcg IV/HD Epogen 2400 Units IV/HD Venofer none. Has been leaving below EDW at OP center by about 0.5 kg prior to 03/14/12.   Assessment/Plan: 1. Back pain/? Lumbar discitis- Blood and aspirate cx's are negative. On abx, no further back pain. ID signed off, f/u clinic, recommended 42d of abx (on vanc/fortaz now).  2. Constipation - Resolved with Sorbitol. On Dulcolax BID. 3. ESRD, cont hd TTS 4. Hypertension/volume - added clonidine to labetalol and norvasc regimen. Under EDW, likely has lost weight; challenge EDW today 5. Anemia - Hgb dropping, 7.1 today. Giving 1 unit prbc today (+Ab's so won't get it in HD likely). Tsat 69% in Dec. Last Ferritin 800 in October. Will increase Aranesp to 100/wk, ? GI blood loss 6. Metabolic bone disease - Hypercalcemia resolved Ca+ 9.0 (10.0 corrected) Phos 4.3. Last PTH 322.2 in October. Resume 2.25 Ca++ bath and Hectorol 2 mcg Q HD. 7. Nutrition - Albumin 2.7. High protein renal diet, multivitamin, Breeze supplement. 8. Hypothyroidism - high TSH, Synthroid dose increased per primary. 9. Dispo - prob needs SNF temporarily.   Vinson Moselle  MD [facility] [phone] pgr    [phone] cell 03/25/2012, 12:27 PM   Additional Objective Labs: Basic Metabolic Panel:  Lab 03/25/12 6213 03/23/12 0713 03/22/12 0049 03/21/12 1645  NA 136 138 136 --  K 3.8 4.2 3.9 --  CL 94* 97 95* --  CO2 26 27 26  --  GLUCOSE 163* 101* 97 --  BUN 29* 34* 21 --  CREATININE 10.66* 11.30* 8.10* --  CALCIUM 8.9 9.0 8.3* --  ALB -- -- -- --  PHOS 4.3 4.3 -- 4.9*   Liver Function Tests:  Lab 03/25/12 0850 03/23/12 0713 03/21/12 1645 03/20/12 2011  AST -- -- -- 12  ALT -- -- -- 5  ALKPHOS -- -- -- 72  BILITOT -- -- -- 0.3  PROT -- -- -- 7.1  ALBUMIN 2.6* 2.7* 2.9* --   CBC:  Lab 03/25/12 0855 03/23/12 0713 03/22/12 0049 03/21/12 0115  WBC 7.0 7.6 8.0 --  NEUTROABS -- -- -- --  HGB 7.1* 7.4* 9.5* --  HCT 20.8* 22.7* 28.1* --  MCV 99.0 97.4 96.2 96.5  PLT 188 211 204 --   Blood Culture    Component Value Date/Time   SDES WOUND ASPIRATE BACK 03/22/2012 1501   SPECREQUEST NONE 03/22/2012 1501   CULT NO GROWTH 2 DAYS 03/22/2012 1501   REPTSTATUS 03/24/2012 FINAL 03/22/2012 1501    Studies/Results: No results found. Medications:      .  amLODipine  10 mg Oral QHS  . aspirin  81 mg Oral Daily  . bisacodyl  10 mg Rectal Once  . calcium acetate  1,334 mg Oral TID WC  . cefTAZidime (FORTAZ)  IV  2 g Intravenous Q T,Th,Sat-1800  . cloNIDine  0.2 mg Oral BID  . darbepoetin (ARANESP) injection - DIALYSIS  25 mcg Intravenous Q Tue-HD  . docusate sodium  100 mg Oral BID  . feeding supplement  1 Container Oral TID BM  . heparin  1,100 Units Intravenous Once  . heparin  5,000 Units Subcutaneous Q8H  . levothyroxine  150 mcg Oral QAC breakfast  . lisinopril  40 mg Oral QHS  . meloxicam  15 mg Oral Daily  . multivitamin  1 tablet Oral QHS  . polyethylene glycol  17 g Oral Daily  . sodium chloride  3 mL Intravenous Q12H  . vancomycin  500 mg Intravenous Q T,Th,Sa-HD

## 2012-03-25 NOTE — Procedures (Signed)
I was present at this dialysis session. I have reviewed the session itself and made appropriate changes.   Vinson Moselle, MD [employer] 03/25/2012, 12:32 PM

## 2012-03-25 NOTE — Progress Notes (Signed)
[facility] Progress Note   Nicole [name] ZOX:[id number] DOB: [birth year] DOA: 03/20/2012 PCP: [name], MD  Brief narrative: 79 year old female patient with chronic kidney disease who presented to the ER because of 2 weeks of constant sharp pain in her lower back primarily on the right side with radiculopathy to the right leg and buttock. Denied any recent injuries fall or trauma. Increased pain on ambulation. Was evaluated in the emergency department on 03/18/2012 for the same but was discharged home. Was taking Vicodin at home without improvement in the pain. In the ER patient had uncontrolled malignant hypertension with highest blood pressure to 86/74 in the setting of severe pain. X-rays including MRI of the lumbar spine revealed question of discitis or osteomyelitis in the L2/L3 region. Patient did require IV labetalol infusion initially to control her blood pressure but she developed bradycardia with heart rates in the 30s so this was discontinued and since that time patient's blood pressure although not well controlled is much better controlled.  Assessment/Plan:   *Acute back pain/ ? Osteomyelitis vs diskitis spine -Back pain is better but c/o ongoing pain in right leg -Cause of question potential infectious source for the pain infectious disease consultation has been obtained -Patient does not have leukocytosis or fever so we'll defer initiation of antibiotics unless infectious disease service feels this is warranted -Continue supportive care with analgesics -Please note ESR mildly elevated at 38 and CRP low at 0.5 which point less toward an infectious etiology to the patient's back pain - IR aspirated L2-L3 disk space on 1/1/514 - culture pending  - empiric antibiotics initiated on 03/22/12 by Dr. Ninetta Lights ID    Abnormal involuntary movements -Patient presented to the hospital with uncontrolled hypertension since admission has developed dyskinetic involuntary movements  involving the right side of the face and upper extremities -Neurology has been consulted but the overall consensus is that she has uremia from non compliance dialysis. - MRI negative for acute stroke - symptoms resolved completely after 2 HD sessions.      PVD (peripheral vascular disease) -Given underlying vascular disease the radiculopathy and pain that occurs with activity could also be a sign of claudication so may benefit from lower extremity arterial Dopplers as outpatient    HTN (hypertension), malignant -Diastolic blood pressure uncontrolled prior to HD. -Normally dialyzes on Tuesdays Thursdays and Saturdays  -Currently on amlodipine, lisinopril for blood pressure- with good control     CKD (chronic kidney disease) stage V requiring chronic dialysis -Dialyzes Tuesday Thursday Saturday,    Hypothyroidism TSH very elevated -Continue Synthroid but increased dose to 150 mcg daily   Anemia in chronic kidney disease(285.21) -Hemoglobin stable  Constipation  Started LOCs   DVT prophylaxis: Subcutaneous heparin Code Status: Full Family Communication: Spoke with patient Disposition Plan: SNF   Consultants: Nephrology Neurology ID Procedures: Aspirate of L2-L3 disk space   Antibiotics: Vanc1/15 [name] 1/15  HPI/Subjective: Seen during HD  Objective: Blood pressure 109/51, pulse 53, temperature 97.1 F (36.2 C), temperature source Oral, resp. rate 18, height 5' (1.524 m), weight 56.2 kg (123 lb 14.4 oz), SpO2 94.00%.  Intake/Output Summary (Last 24 hours) at 03/25/12 0951 Last data filed at 03/24/12 1700  Gross per 24 hour  Intake    240 ml  Output      0 ml  Net    240 ml   Patient Vitals for the past 24 hrs:  BP Temp Temp src Pulse Resp SpO2 Weight  03/25/12 0930 109/51 mmHg - -  53  - - -  03/25/12 0858 136/55 mmHg - - 43  18  - -  03/25/12 0847 144/62 mmHg 97.1 F (36.2 C) Oral 47  18  94 % 56.2 kg (123 lb 14.4 oz)  03/25/12 0553 - 98.4 F (36.9 C) Oral  45  17  97 % -  03/24/12 2125 145/64 mmHg 98.9 F (37.2 C) Oral 88  17  97 % 57.9 kg (127 lb 10.3 oz)  03/24/12 1752 161/79 mmHg 97.8 F (36.6 C) Oral 58  18  96 % -  03/24/12 1019 151/77 mmHg 98.4 F (36.9 C) Oral 44  18  98 % -      Exam: axox3 CVS: RRR RS; CTAB Abdomen : soft, nt  Data Reviewed: Basic Metabolic Panel:  Lab 03/25/12 1610 03/23/12 0713 03/22/12 0049 03/21/12 1645 03/20/12 2114  NA 136 138 136 135 137  K 3.8 4.2 3.9 4.3 4.2  CL 94* 97 95* 92* 98  CO2 26 27 26 24  --  GLUCOSE 163* 101* 97 121* 86  BUN 29* 34* 21 67* 61*  CREATININE 10.66* 11.30* 8.10* 18.13* 14.60*  CALCIUM 8.9 9.0 8.3* 8.5 --  MG -- -- -- -- --  PHOS 4.3 4.3 -- 4.9* --   Liver Function Tests:  Lab 03/25/12 0850 03/23/12 0713 03/21/12 1645 03/20/12 2011  AST -- -- -- 12  ALT -- -- -- 5  ALKPHOS -- -- -- 72  BILITOT -- -- -- 0.3  PROT -- -- -- 7.1  ALBUMIN 2.6* 2.7* 2.9* 3.3*   No results found for this basename: LIPASE:5,AMYLASE:5 in the last 168 hours No results found for this basename: AMMONIA:5 in the last 168 hours CBC:  Lab 03/25/12 0855 03/23/12 0713 03/22/12 0049 03/21/12 0115 03/20/12 2114  WBC 7.0 7.6 8.0 8.4 --  NEUTROABS -- -- -- -- --  HGB 7.1* 7.4* 9.5* 9.4* 9.9*  HCT 20.8* 22.7* 28.1* 27.6* 29.0*  MCV 99.0 97.4 96.2 96.5 --  PLT 188 211 204 179 --   Cardiac Enzymes: No results found for this basename: CKTOTAL:5,CKMB:5,CKMBINDEX:5,TROPONINI:5 in the last 168 hours BNP (last 3 results) No results found for this basename: PROBNP:3 in the last 8760 hours CBG: No results found for this basename: GLUCAP:5 in the last 168 hours  Recent Results (from the past 240 hour(s))  URINE CULTURE     Status: Normal   Collection Time   03/18/12  6:58 AM      Component Value Range Status Comment   Specimen Description URINE, RANDOM   Final    Special Requests NONE   Final    Culture  Setup Time 03/18/2012 15:55   Final    Colony Count >=100,000 COLONIES/ML   Final     Culture     Final    Value: Multiple bacterial morphotypes present, none predominant. Suggest appropriate recollection if clinically indicated.   Report Status 03/19/2012 FINAL   Final   CULTURE, BLOOD (ROUTINE X 2)     Status: Normal (Preliminary result)   Collection Time   03/21/12  3:18 AM      Component Value Range Status Comment   Specimen Description BLOOD LEFT HAND   Final    Special Requests BOTTLES DRAWN AEROBIC ONLY 10CC   Final    Culture  Setup Time 03/21/2012 09:28   Final    Culture     Final    Value:        BLOOD CULTURE RECEIVED NO GROWTH  TO DATE CULTURE WILL BE HELD FOR 5 DAYS BEFORE ISSUING A FINAL NEGATIVE REPORT   Report Status PENDING   Incomplete   CULTURE, BLOOD (ROUTINE X 2)     Status: Normal (Preliminary result)   Collection Time   03/21/12  3:20 AM      Component Value Range Status Comment   Specimen Description BLOOD RIGHT ARM   Final    Special Requests BOTTLES DRAWN AEROBIC AND ANAEROBIC 10CC EA   Final    Culture  Setup Time 03/21/2012 09:28   Final    Culture     Final    Value:        BLOOD CULTURE RECEIVED NO GROWTH TO DATE CULTURE WILL BE HELD FOR 5 DAYS BEFORE ISSUING A FINAL NEGATIVE REPORT   Report Status PENDING   Incomplete   MRSA PCR SCREENING     Status: Normal   Collection Time   03/21/12 10:47 PM      Component Value Range Status Comment   MRSA by PCR NEGATIVE  NEGATIVE Final   WOUND CULTURE     Status: Normal   Collection Time   03/22/12  3:01 PM      Component Value Range Status Comment   Specimen Description WOUND ASPIRATE BACK   Final    Special Requests NONE   Final    Gram Stain     Final    Value: RARE WBC PRESENT, PREDOMINANTLY PMN     NO SQUAMOUS EPITHELIAL CELLS SEEN     NO ORGANISMS SEEN   Culture NO GROWTH 2 DAYS   Final    Report Status 03/24/2012 FINAL   Final      Studies:  Recent x-ray studies have been reviewed in detail by the Attending Physician  Scheduled    . amLODipine  10 mg Oral QHS  . aspirin  81 mg  Oral Daily  . bisacodyl  10 mg Rectal Once  . calcium acetate  1,334 mg Oral TID WC  . cefTAZidime (FORTAZ)  IV  2 g Intravenous Q T,Th,Sat-1800  . cloNIDine  0.2 mg Oral BID  . darbepoetin (ARANESP) injection - DIALYSIS  25 mcg Intravenous Q Tue-HD  . docusate sodium  100 mg Oral BID  . feeding supplement  1 Container Oral TID BM  . heparin  1,100 Units Intravenous Once  . heparin  5,000 Units Subcutaneous Q8H  . levothyroxine  150 mcg Oral QAC breakfast  . lisinopril  40 mg Oral QHS  . meloxicam  15 mg Oral Daily  . multivitamin  1 tablet Oral QHS  . polyethylene glycol  17 g Oral Daily  . sodium chloride  3 mL Intravenous Q12H  . vancomycin  500 mg Intravenous Joseph Art   Pager [phone]  On-Call/Text Page:      [name]      password [email or web address]  If 7PM-7AM, please contact night-coverage [email or web address] Password [email or web address] 03/25/2012, 9:51 AM   LOS: 5 days

## 2012-03-25 NOTE — Progress Notes (Deleted)
Subjective:   On hd no cos, lives at home with son and daughter in law , realizes she has to try to walk with PT Objective Vital signs in last 24 hours: Filed Vitals:   03/25/12 1030 03/25/12 1100 03/25/12 1130 03/25/12 1201  BP: 116/49 118/51 123/54 120/51  Pulse: 53 54 51 56  Temp:      TempSrc:      Resp:      Height:      Weight:      SpO2:       Weight change: 1.8 kg (3 lb 15.5 oz)  Intake/Output Summary (Last 24 hours) at 03/25/12 1224 Last data filed at 03/24/12 1700  Gross per 24 hour  Intake    240 ml  Output      0 ml  Net    240 ml   Labs: Basic Metabolic Panel:  Lab 03/25/12 1308 03/23/12 0713 03/22/12 0049 03/21/12 1645  NA 136 138 136 --  K 3.8 4.2 3.9 --  CL 94* 97 95* --  CO2 26 27 26  --  GLUCOSE 163* 101* 97 --  BUN 29* 34* 21 --  CREATININE 10.66* 11.30* 8.10* --  CALCIUM 8.9 9.0 8.3* --  ALB -- -- -- --  PHOS 4.3 4.3 -- 4.9*   Liver Function Tests:  Lab 03/25/12 0850 03/23/12 0713 03/21/12 1645 03/20/12 2011  AST -- -- -- 12  ALT -- -- -- 5  ALKPHOS -- -- -- 72  BILITOT -- -- -- 0.3  PROT -- -- -- 7.1  ALBUMIN 2.6* 2.7* 2.9* --   No results found for this basename: LIPASE:3,AMYLASE:3 in the last 168 hours No results found for this basename: AMMONIA:3 in the last 168 hours CBC:  Lab 03/25/12 0855 03/23/12 0713 03/22/12 0049 03/21/12 0115  WBC 7.0 7.6 8.0 --  NEUTROABS -- -- -- --  HGB 7.1* 7.4* 9.5* --  HCT 20.8* 22.7* 28.1* --  MCV 99.0 97.4 96.2 96.5  PLT 188 211 204 --   Cardiac Enzymes: No results found for this basename: CKTOTAL:5,CKMB:5,CKMBINDEX:5,TROPONINI:5 in the last 168 hours CBG: No results found for this basename: GLUCAP:5 in the last 168 hours  Iron Studies: No results found for this basename: IRON,TIBC,TRANSFERRIN,FERRITIN in the last 72 hours Studies/Results: No results found. Medications:      . amLODipine  10 mg Oral QHS  . aspirin  81 mg Oral Daily  . bisacodyl  10 mg Rectal Once  . calcium acetate  1,334  mg Oral TID WC  . cefTAZidime (FORTAZ)  IV  2 g Intravenous Q T,Th,Sat-1800  . cloNIDine  0.2 mg Oral BID  . darbepoetin (ARANESP) injection - DIALYSIS  25 mcg Intravenous Q Tue-HD  . docusate sodium  100 mg Oral BID  . feeding supplement  1 Container Oral TID BM  . heparin  1,100 Units Intravenous Once  . heparin  5,000 Units Subcutaneous Q8H  . levothyroxine  150 mcg Oral QAC breakfast  . lisinopril  40 mg Oral QHS  . meloxicam  15 mg Oral Daily  . multivitamin  1 tablet Oral QHS  . polyethylene glycol  17 g Oral Daily  . sodium chloride  3 mL Intravenous Q12H  . vancomycin  500 mg Intravenous Q T,Th,Sa-HD   I  have reviewed scheduled and prn medications.  Physical Exam  General: on HD and NAD  Heart: RRR , no rub or murmur Lungs: CTA bilaterally. Abdomen: Soft, non-tender,. Normal BS  Extremities: No  LE edema  Dialysis Access: RFA AVG patent on hd  Dialysis Orders: Center: [facility] on TTS - no tx since 03/14/12.  EDW 58 kg HD Bath 2K+/ 2.25 Ca++ Time 4:00 Heparin 6000. Access RFA AVG BFR 400 DFR A1.5  Hectorol 2 mcg IV/HD Epogen 2400 Units IV/HD Venofer none. Has been leaving below EDW at OP center by about 0.5 kg prior to 03/14/12.   Assessment/Plan:  1. Back pain/? Lumbar discitis- pain resolving and Blood and aspirate cx's are negative. On abx, ID recommending 42 days antibiotics , on day #3.Vanco / [name] 2. Constipation - Resolved with Sorbitol. On Dulcolax BID. 3. ESRD, cont hd TTS 4. Hypertension/volume - added clonidine to labetalol and norvasc regimen. 2 kg Under EDW, 56.2 kg  likely has lost weight. challenge volume with HD  5. Anemia - Hgb this am 7.1<yesterday 7.4 from 9.5 Denies blood in stool. No acute blood loss/ Tsat 69% in Dec. Last Ferritin 800 in October. Aranesp 25 Tuesdays on HD. Increase to 60 and  transfuse 1 unit for HGB < 8. Tight heparin. 6. Metabolic bone disease - Hypercalcemia resolved Ca+ 8.9 (10.3 corrected) Phos 4.3. Last PTH 322.2 in October. Resume  2.25 Ca++ bath and OP Hectorol 2 mcg Q HD. 7. Nutrition - Albumin 2.6 High protein renal diet, multivitamin, Breeze supplement. 8. Hypothyroidism - high TSH, 163  Synthroid dose increased per primary. 9. Dispo - May  need SNF temporarily.  Lenny Pastel, PA-C [facility] [phone] 03/25/2012,12:24 PM  LOS: 5 days

## 2012-03-25 NOTE — Procedures (Signed)
EEG report.  Brief clinical history: 79 years old female with no frank history of epileptic seizures but new onset abnormal movements involving face, neck, and right arm. No taking antiepileptic drugs.   Technique: this is a 17 channel routine scalp EEG performed at the bedside with bipolar and monopolar montages arranged in accordance to the international 10/20 system of electrode placement. One channel was dedicated to EKG recording.  The study was performed predominantly during wakefulness, drowsiness, and stage 2 sleep.  Intermittent photic stimulation was the sole activating procedure employed during the test.  Description: As the tracing begins, patient falls rapidly into drowsiness but as soon as she becomes wake, she is able to achieve a posterior dominant,medium amplitude, well sustained, symmetric, and reactive 10 Hz rhythm.  The sleep portion of the test demonstrated normal architecture without intermixed epileptiform discharges .  Intermittent photic stimulation did not induce a driving response.  No focal or generalized epileptiform discharges noted.  No pathologic areas of slowing seen.  EKG showed sinus rhythm.  Impression: this is a normal EEG performed predominantly during wakefulness, drowsiness, and sleep. Please, be aware that a normal EEG does not exclude the possibility of epilepsy.  Clinical correlation is advised.  Wyatt Portela, MD

## 2012-03-25 NOTE — Progress Notes (Deleted)
Subjective:  No cos , awoken from sleep , back pain denies Objective Vital signs in last 24 hours: Filed Vitals:   03/25/12 1030 03/25/12 1100 03/25/12 1130 03/25/12 1201  BP: 116/49 118/51 123/54 120/51  Pulse: 53 54 51 56  Temp:      TempSrc:      Resp:      Height:      Weight:      SpO2:       Weight change: 1.8 kg (3 lb 15.5 oz)  Intake/Output Summary (Last 24 hours) at 03/25/12 1207 Last data filed at 03/24/12 1700  Gross per 24 hour  Intake    240 ml  Output      0 ml  Net    240 ml   Labs: Basic Metabolic Panel:  Lab 03/25/12 2440 03/23/12 0713 03/22/12 0049 03/21/12 1645  NA 136 138 136 --  K 3.8 4.2 3.9 --  CL 94* 97 95* --  CO2 26 27 26  --  GLUCOSE 163* 101* 97 --  BUN 29* 34* 21 --  CREATININE 10.66* 11.30* 8.10* --  CALCIUM 8.9 9.0 8.3* --  ALB -- -- -- --  PHOS 4.3 4.3 -- 4.9*   Liver Function Tests:  Lab 03/25/12 0850 03/23/12 0713 03/21/12 1645 03/20/12 2011  AST -- -- -- 12  ALT -- -- -- 5  ALKPHOS -- -- -- 72  BILITOT -- -- -- 0.3  PROT -- -- -- 7.1  ALBUMIN 2.6* 2.7* 2.9* --   No results found for this basename: LIPASE:3,AMYLASE:3 in the last 168 hours No results found for this basename: AMMONIA:3 in the last 168 hours CBC:  Lab 03/25/12 0855 03/23/12 0713 03/22/12 0049 03/21/12 0115  WBC 7.0 7.6 8.0 --  NEUTROABS -- -- -- --  HGB 7.1* 7.4* 9.5* --  HCT 20.8* 22.7* 28.1* --  MCV 99.0 97.4 96.2 96.5  PLT 188 211 204 --   Cardiac Enzymes: No results found for this basename: CKTOTAL:5,CKMB:5,CKMBINDEX:5,TROPONINI:5 in the last 168 hours CBG: No results found for this basename: GLUCAP:5 in the last 168 hours  Iron Studies: No results found for this basename: IRON,TIBC,TRANSFERRIN,FERRITIN in the last 72 hours Studies/Results: No results found. Medications:      . amLODipine  10 mg Oral QHS  . aspirin  81 mg Oral Daily  . bisacodyl  10 mg Rectal Once  . calcium acetate  1,334 mg Oral TID WC  . cefTAZidime (FORTAZ)  IV  2 g  Intravenous Q T,Th,Sat-1800  . cloNIDine  0.2 mg Oral BID  . darbepoetin (ARANESP) injection - DIALYSIS  25 mcg Intravenous Q Tue-HD  . docusate sodium  100 mg Oral BID  . feeding supplement  1 Container Oral TID BM  . heparin  1,100 Units Intravenous Once  . heparin  5,000 Units Subcutaneous Q8H  . levothyroxine  150 mcg Oral QAC breakfast  . lisinopril  40 mg Oral QHS  . meloxicam  15 mg Oral Daily  . multivitamin  1 tablet Oral QHS  . polyethylene glycol  17 g Oral Daily  . sodium chloride  3 mL Intravenous Q12H  . vancomycin  500 mg Intravenous Q T,Th,Sa-HD   I  have reviewed scheduled and prn medications.  Physical Exam  General: Resting in bed and  NAD  Heart: RRR , soft 1/6 sem lsb ,  Lungs: CTA bilaterally. No wheezes, rales, or rhonchi appreciated.  Abdomen: Soft, non-tender,  Normal BS  Extremities: No LE edema  Dialysis Access: RFA AVG with + bruit   Dialysis Orders: Center: [facility] on TTS - no tx since 03/14/12.  EDW 58 kg HD Bath 2K+/ 2.25 Ca++ Time 4:00 Heparin 6000. Access RFA AVG BFR 400 DFR A1.5  Hectorol 2 mcg IV/HD Epogen 2400 Units IV/HD Venofer none. Has been leaving below EDW at OP center by about 0.5 kg prior to 03/14/12.   Assessment/Plan:  1. Back pain/? Lumbar discitis- Blood and aspirate cx's are negative. On abx, no further back pain. ID signed off, f/u at ID clinic,( recommended 42d of  vanc/fortaz now). DAY # 4 2. Constipation - Resolved with Sorbitol. On Dulcolax BID. 3. ESRD, cont hd TTS [facility] 4. Hypertension/volume - On  clonidine , lisinopril,  and norvasc regimen. Under EDW~~5 kg/ likely has lost weight; Yesterday to 53.5 kg 5. Anemia - Hgb dropping, 7.1  yesterday. Giving 1 unit prbc today (+Ab's so won't get it in HD likely). Tsat 69% in Dec. Last Ferritin 800 in October. Will increase Aranesp to 100/wk, ? GI blood loss 6. Metabolic bone disease -  Ca+ 8,9 (10.3 corrected) Phos 4.3. Last PTH 322.2 in October. Resume 2.25 Ca++ bath and Hectorol 2 mcg Q  HD. phoslo 2 ac  binder  7. Nutrition - Albumin 2.6. High protein renal diet, multivitamin, Breeze supplement. 8. Hypothyroidism - high TSH, Synthroid dose increased per primary. 9. Dispo - prob needs SNF temporarily.  Lenny Pastel, PA-C [facility] [phone] 03/25/2012,12:07 PM  LOS: 5 days

## 2012-03-26 DIAGNOSIS — I10 Essential (primary) hypertension: Secondary | ICD-10-CM

## 2012-03-26 LAB — TYPE AND SCREEN
ABO/RH(D): A POS
Donor AG Type: NEGATIVE

## 2012-03-26 NOTE — Progress Notes (Signed)
Subjective:  Awoken from sleep nocos Objective Vital signs in last 24 hours: Filed Vitals:   03/25/12 2030 03/25/12 2103 03/25/12 2137 03/26/12 0537  BP: 123/47  139/60 145/99  Pulse: 72 55 75 62  Temp: 98.9 F (37.2 C) 98.9 F (37.2 C) 98.3 F (36.8 C) 98.3 F (36.8 C)  TempSrc: Oral Oral Oral Oral  Resp: 18 18 18 18   Height:      Weight:  55.4 kg (122 lb 2.2 oz)    SpO2:    100%   Weight change: -1.7 kg (-3 lb 12 oz)  Intake/Output Summary (Last 24 hours) at 03/26/12 1106 Last data filed at 03/25/12 1700  Gross per 24 hour  Intake    120 ml  Output   1856 ml  Net  -1736 ml   Labs: Basic Metabolic Panel:  Lab 03/25/12 1610 03/23/12 0713 03/22/12 0049 03/21/12 1645  NA 136 138 136 --  K 3.8 4.2 3.9 --  CL 94* 97 95* --  CO2 26 27 26  --  GLUCOSE 163* 101* 97 --  BUN 29* 34* 21 --  CREATININE 10.66* 11.30* 8.10* --  CALCIUM 8.9 9.0 8.3* --  ALB -- -- -- --  PHOS 4.3 4.3 -- 4.9*   Liver Function Tests:  Lab 03/25/12 0850 03/23/12 0713 03/21/12 1645 03/20/12 2011  AST -- -- -- 12  ALT -- -- -- 5  ALKPHOS -- -- -- 72  BILITOT -- -- -- 0.3  PROT -- -- -- 7.1  ALBUMIN 2.6* 2.7* 2.9* --   No results found for this basename: LIPASE:3,AMYLASE:3 in the last 168 hours No results found for this basename: AMMONIA:3 in the last 168 hours CBC:  Lab 03/25/12 0855 03/23/12 0713 03/22/12 0049 03/21/12 0115  WBC 7.0 7.6 8.0 --  NEUTROABS -- -- -- --  HGB 7.1* 7.4* 9.5* --  HCT 20.8* 22.7* 28.1* --  MCV 99.0 97.4 96.2 96.5  PLT 188 211 204 --   Cardiac Enzymes: No results found for this basename: CKTOTAL:5,CKMB:5,CKMBINDEX:5,TROPONINI:5 in the last 168 hours CBG:  Lab 03/25/12 1631  GLUCAP 104*    Iron Studies: No results found for this basename: IRON,TIBC,TRANSFERRIN,FERRITIN in the last 72 hours Studies/Results: No results found. Medications:      . amLODipine  10 mg Oral QHS  . aspirin  81 mg Oral Daily  . bisacodyl  10 mg Rectal Once  . calcium acetate   1,334 mg Oral TID WC  . cefTAZidime (FORTAZ)  IV  2 g Intravenous Q T,Th,Sat-1800  . cloNIDine  0.2 mg Oral BID  . darbepoetin (ARANESP) injection - DIALYSIS  100 mcg Intravenous Q Sat-HD  . docusate sodium  100 mg Oral BID  . feeding supplement  1 Container Oral TID BM  . heparin  1,100 Units Intravenous Once  . heparin  5,000 Units Subcutaneous Q8H  . levothyroxine  150 mcg Oral QAC breakfast  . lisinopril  40 mg Oral QHS  . meloxicam  15 mg Oral Daily  . multivitamin  1 tablet Oral QHS  . polyethylene glycol  17 g Oral Daily  . sodium chloride  3 mL Intravenous Q12H  . vancomycin  500 mg Intravenous Q T,Th,Sa-HD   I  have reviewed scheduled and prn medications.  Physical Exam  General: Resting in bed. NAD  Heart: RRR , 1/6 sem lsb Lungs: CTA bilaterally. .  Abdomen: Soft, non-tender. Normal BS  Extremities: No LE edema  Dialysis Access: RFA AVG with + bruit  Dialysis Orders: Center: [facility] on TTS - no tx since 03/14/12.  EDW 58 kg HD Bath 2K+/ 2.25 Ca++ Time 4:00 Heparin 6000. Access RFA AVG BFR 400 DFR A1.5  Hectorol 2 mcg IV/HD Epogen 2400 Units IV/HD Venofer none. Has been leaving below EDW at OP center by about 0.5 kg prior to 03/14/12.   Assessment/Plan:  1. Back pain/? Lumbar discitis- Blood and aspirate cx's are negative. On abx, no further back pain. ID signed off, f/u ID  clinic recommended 42d of abx (on vanc/fortaz now day #4)  2. Constipation - Resolved with Sorbitol. On Dulcolax BID. 3. ESRD, cont hd TTS [facility] 4. Hypertension/volume - added clonidine to lisinopril, and norvasc regimen. Under EDW by 5 kg  yesterday  Hd post wt 55 kg/ likely has lost weight. 5. Anemia - Hgb dropping, 7.1 . Got 1 unit PRBC's on 1/18. Tsat 69% in Dec. Last Ferritin 800 in October. Will increase Aranesp to 100/wk, ? GI blood loss/ fu hgb am, ordered stool for occult blood 6. Metabolic bone disease - Hypercalcemia resolved Ca+ 8.9 (10.4 corrected) Phos 4.3. Last PTH 322.2 in October on   Hectorol 2 mcg Q HD. 7. Nutrition - Albumin 2.6. High protein renal diet, multivitamin, Breeze supplement. 8. Hypothyroidism - high TSH, Synthroid dose increased per primary 9. .Dispo - prob needs SNF temporarily.  Lenny Pastel, PA-C [facility] [phone] 03/26/2012,11:06 AM  LOS: 6 days   Patient seen and examined and above reviewed.  Agree with assessment and plan as above. Vinson Moselle  MD [employer] [phone] pgr    [phone] cell 03/26/2012, 9:20 PM

## 2012-03-26 NOTE — Progress Notes (Signed)
[facility] Progress Note   BRUCE MAYERS WGN:[id number] DOB: 1932/11/12 DOA: 03/20/2012 PCP: [name], MD  Brief narrative: 79 year old female patient with chronic kidney disease who presented to the ER because of 2 weeks of constant sharp pain in her lower back primarily on the right side with radiculopathy to the right leg and buttock. Denied any recent injuries fall or trauma. Increased pain on ambulation. Was evaluated in the emergency department on 03/18/2012 for the same but was discharged home. Was taking Vicodin at home without improvement in the pain. In the ER patient had uncontrolled malignant hypertension with highest blood pressure to 86/74 in the setting of severe pain. X-rays including MRI of the lumbar spine revealed question of discitis or osteomyelitis in the L2/L3 region. Patient did require IV labetalol infusion initially to control her blood pressure but she developed bradycardia with heart rates in the 30s so this was discontinued and since that time patient's blood pressure although not well controlled is much better controlled.  Assessment/Plan:   *Acute back pain/ ? Osteomyelitis vs diskitis spine -Back pain is better  -Continue supportive care with analgesics - ESR mildly elevated at 38 and CRP low at 0.5 - IR aspirated L2-L3 disk space on 1/1/514 - culture without growth  - empiric antibiotics initiated on 03/22/12 by Dr. Ninetta Lights ID.  -plan for 42 days total of iv abx with HD.     Abnormal involuntary movements -Patient presented to the hospital with uncontrolled hypertension since admission has developed dyskinetic involuntary movements involving the right side of the face and upper extremities -Neurology has been consulted but the overall consensus is that she had uremia from non compliance dialysis. - MRI negative for acute stroke - EEG negative for epileptiform discharges  - symptoms resolved completely after 2 HD sessions.      PVD  (peripheral vascular disease) -Given underlying vascular disease the radiculopathy and pain that occurs with activity could also be a sign of claudication so may benefit from lower extremity arterial Dopplers as outpatient    HTN (hypertension), malignant -Diastolic blood pressure uncontrolled prior to HD. -Normally dialyzes on Tuesdays Thursdays and Saturdays  -Currently on amlodipine, lisinopril for blood pressure- with good control     CKD (chronic kidney disease) stage V requiring chronic dialysis -Dialyzes Tuesday Thursday Saturday,    Hypothyroidism TSH very elevated -Continue Synthroid but increased dose to 150 mcg daily   Anemia in chronic kidney disease(285.21) -Hemoglobin stable  Constipation  Started LOCs   DVT prophylaxis: Subcutaneous heparin Code Status: Full Family Communication: Spoke with patient Disposition Plan: SNF   Consultants: Nephrology Neurology ID Procedures: Aspirate of L2-L3 disk space  EEG  Antibiotics: Vanc1/15 [name] 1/15  HPI/Subjective: No new events   Objective: Blood pressure 145/99, pulse 62, temperature 98.3 F (36.8 C), temperature source Oral, resp. rate 18, height 5' (1.524 m), weight 55.4 kg (122 lb 2.2 oz), SpO2 100.00%.  Intake/Output Summary (Last 24 hours) at 03/26/12 0732 Last data filed at 03/25/12 1700  Gross per 24 hour  Intake    120 ml  Output   1856 ml  Net  -1736 ml   Patient Vitals for the past 24 hrs:  BP Temp Temp src Pulse Resp SpO2 Weight  03/26/12 0537 145/99 mmHg 98.3 F (36.8 C) Oral 62  18  100 % -  03/25/12 2137 139/60 mmHg 98.3 F (36.8 C) Oral 75  18  - -  03/25/12 2103 - 98.9 F (37.2 C) Oral 55  18  -  55.4 kg (122 lb 2.2 oz)  03/25/12 2030 123/47 mmHg 98.9 F (37.2 C) Oral 72  18  - -  03/25/12 1930 137/24 mmHg 99 F (37.2 C) Oral 50  18  - -  03/25/12 1830 149/62 mmHg 98.2 F (36.8 C) Oral 70  18  - -  03/25/12 1806 137/51 mmHg 98.1 F (36.7 C) Oral 74  20  - -  03/25/12 1748 109/48  mmHg 98 F (36.7 C) Oral 65  18  96 % -  03/25/12 1308 132/55 mmHg - - 44  - - 53.5 kg (117 lb 15.1 oz)  03/25/12 1230 99/52 mmHg - - 61  - - -  03/25/12 1201 120/51 mmHg - - 56  - - -  03/25/12 1130 123/54 mmHg - - 51  - - -  03/25/12 1100 118/51 mmHg - - 54  - - -  03/25/12 1030 116/49 mmHg - - 53  - - -  03/25/12 1000 127/51 mmHg - - 54  - - -  03/25/12 0930 109/51 mmHg - - 53  - - -  03/25/12 0858 136/55 mmHg - - 43  18  - -  03/25/12 0847 144/62 mmHg 97.1 F (36.2 C) Oral 47  18  94 % 56.2 kg (123 lb 14.4 oz)      Exam: axox3 CVS: RRR RS; CTAB Abdomen : soft, nt  Data Reviewed: Basic Metabolic Panel:  Lab 03/25/12 1610 03/23/12 0713 03/22/12 0049 03/21/12 1645 03/20/12 2114  NA 136 138 136 135 137  K 3.8 4.2 3.9 4.3 4.2  CL 94* 97 95* 92* 98  CO2 26 27 26 24  --  GLUCOSE 163* 101* 97 121* 86  BUN 29* 34* 21 67* 61*  CREATININE 10.66* 11.30* 8.10* 18.13* 14.60*  CALCIUM 8.9 9.0 8.3* 8.5 --  MG -- -- -- -- --  PHOS 4.3 4.3 -- 4.9* --   Liver Function Tests:  Lab 03/25/12 0850 03/23/12 0713 03/21/12 1645 03/20/12 2011  AST -- -- -- 12  ALT -- -- -- 5  ALKPHOS -- -- -- 72  BILITOT -- -- -- 0.3  PROT -- -- -- 7.1  ALBUMIN 2.6* 2.7* 2.9* 3.3*   No results found for this basename: LIPASE:5,AMYLASE:5 in the last 168 hours No results found for this basename: AMMONIA:5 in the last 168 hours CBC:  Lab 03/25/12 0855 03/23/12 0713 03/22/12 0049 03/21/12 0115 03/20/12 2114  WBC 7.0 7.6 8.0 8.4 --  NEUTROABS -- -- -- -- --  HGB 7.1* 7.4* 9.5* 9.4* 9.9*  HCT 20.8* 22.7* 28.1* 27.6* 29.0*  MCV 99.0 97.4 96.2 96.5 --  PLT 188 211 204 179 --   Cardiac Enzymes: No results found for this basename: CKTOTAL:5,CKMB:5,CKMBINDEX:5,TROPONINI:5 in the last 168 hours BNP (last 3 results) No results found for this basename: PROBNP:3 in the last 8760 hours CBG:  Lab 03/25/12 1631  GLUCAP 104*    Recent Results (from the past 240 hour(s))  URINE CULTURE     Status: Normal     Collection Time   03/18/12  6:58 AM      Component Value Range Status Comment   Specimen Description URINE, RANDOM   Final    Special Requests NONE   Final    Culture  Setup Time 03/18/2012 15:55   Final    Colony Count >=100,000 COLONIES/ML   Final    Culture     Final    Value: Multiple bacterial morphotypes present, none predominant. Suggest  appropriate recollection if clinically indicated.   Report Status 03/19/2012 FINAL   Final   CULTURE, BLOOD (ROUTINE X 2)     Status: Normal (Preliminary result)   Collection Time   03/21/12  3:18 AM      Component Value Range Status Comment   Specimen Description BLOOD LEFT HAND   Final    Special Requests BOTTLES DRAWN AEROBIC ONLY 10CC   Final    Culture  Setup Time 03/21/2012 09:28   Final    Culture     Final    Value:        BLOOD CULTURE RECEIVED NO GROWTH TO DATE CULTURE WILL BE HELD FOR 5 DAYS BEFORE ISSUING A FINAL NEGATIVE REPORT   Report Status PENDING   Incomplete   CULTURE, BLOOD (ROUTINE X 2)     Status: Normal (Preliminary result)   Collection Time   03/21/12  3:20 AM      Component Value Range Status Comment   Specimen Description BLOOD RIGHT ARM   Final    Special Requests BOTTLES DRAWN AEROBIC AND ANAEROBIC 10CC EA   Final    Culture  Setup Time 03/21/2012 09:28   Final    Culture     Final    Value:        BLOOD CULTURE RECEIVED NO GROWTH TO DATE CULTURE WILL BE HELD FOR 5 DAYS BEFORE ISSUING A FINAL NEGATIVE REPORT   Report Status PENDING   Incomplete   MRSA PCR SCREENING     Status: Normal   Collection Time   03/21/12 10:47 PM      Component Value Range Status Comment   MRSA by PCR NEGATIVE  NEGATIVE Final   WOUND CULTURE     Status: Normal   Collection Time   03/22/12  3:01 PM      Component Value Range Status Comment   Specimen Description WOUND ASPIRATE BACK   Final    Special Requests NONE   Final    Gram Stain     Final    Value: RARE WBC PRESENT, PREDOMINANTLY PMN     NO SQUAMOUS EPITHELIAL CELLS SEEN      NO ORGANISMS SEEN   Culture NO GROWTH 2 DAYS   Final    Report Status 03/24/2012 FINAL   Final      Studies:  Recent x-ray studies have been reviewed in detail by the Attending Physician  Scheduled    . amLODipine  10 mg Oral QHS  . aspirin  81 mg Oral Daily  . bisacodyl  10 mg Rectal Once  . calcium acetate  1,334 mg Oral TID WC  . cefTAZidime (FORTAZ)  IV  2 g Intravenous Q T,Th,Sat-1800  . cloNIDine  0.2 mg Oral BID  . darbepoetin (ARANESP) injection - DIALYSIS  100 mcg Intravenous Q Sat-HD  . docusate sodium  100 mg Oral BID  . feeding supplement  1 Container Oral TID BM  . heparin  1,100 Units Intravenous Once  . heparin  5,000 Units Subcutaneous Q8H  . levothyroxine  150 mcg Oral QAC breakfast  . lisinopril  40 mg Oral QHS  . meloxicam  15 mg Oral Daily  . multivitamin  1 tablet Oral QHS  . polyethylene glycol  17 g Oral Daily  . sodium chloride  3 mL Intravenous Q12H  . vancomycin  500 mg Intravenous Joseph Art   Pager [phone]  On-Call/Text Page:      [name]  password [email or web address]  If 7PM-7AM, please contact night-coverage [email or web address] Password [email or web address] 03/26/2012, 7:32 AM   LOS: 6 days

## 2012-03-27 LAB — CBC
MCH: 33.3 pg (ref 26.0–34.0)
MCHC: 34.1 g/dL (ref 30.0–36.0)
MCV: 97.8 fL (ref 78.0–100.0)
Platelets: 226 10*3/uL (ref 150–400)
RDW: 15.8 % — ABNORMAL HIGH (ref 11.5–15.5)

## 2012-03-27 LAB — CULTURE, BLOOD (ROUTINE X 2)

## 2012-03-27 NOTE — Progress Notes (Signed)
ANTIBIOTIC CONSULT NOTE - FOLLOW UP  Pharmacy Consult for Vancomycin + Ceftazidime Indication: Empiric osteomyelitis vs diskitis spine  No Known Allergies  Patient Measurements: Height: 5' (152.4 cm) Weight: 121 lb 7.6 oz (55.1 kg) IBW/kg (Calculated) : 45.5   Vital Signs: Temp: 97.9 F (36.6 C) (01/20 0926) Temp src: Oral (01/20 0926) BP: 120/42 mmHg (01/20 0926) Pulse Rate: 78  (01/20 0926) Intake/Output from previous day: 01/19 0701 - 01/20 0700 In: 600 [P.O.:600] Out: -  Intake/Output from this shift:    Labs:  Basename 03/27/12 0757 03/25/12 0855 03/25/12 0850  WBC 7.7 7.0 --  HGB 9.1* 7.1* --  PLT 226 188 --  LABCREA -- -- --  CREATININE -- -- 10.66*   Estimated Creatinine Clearance: 3.4 ml/min (by C-G formula based on Cr of 10.66). No results found for this basename: VANCOTROUGH:2,VANCOPEAK:2,VANCORANDOM:2,GENTTROUGH:2,GENTPEAK:2,GENTRANDOM:2,TOBRATROUGH:2,TOBRAPEAK:2,TOBRARND:2,AMIKACINPEAK:2,AMIKACINTROU:2,AMIKACIN:2, in the last 72 hours   Microbiology: Recent Results (from the past 720 hour(s))  URINE CULTURE     Status: Normal   Collection Time   03/18/12  6:58 AM      Component Value Range Status Comment   Specimen Description URINE, RANDOM   Final    Special Requests NONE   Final    Culture  Setup Time 03/18/2012 15:55   Final    Colony Count >=100,000 COLONIES/ML   Final    Culture     Final    Value: Multiple bacterial morphotypes present, none predominant. Suggest appropriate recollection if clinically indicated.   Report Status 03/19/2012 FINAL   Final   CULTURE, BLOOD (ROUTINE X 2)     Status: Normal   Collection Time   03/21/12  3:18 AM      Component Value Range Status Comment   Specimen Description BLOOD LEFT HAND   Final    Special Requests BOTTLES DRAWN AEROBIC ONLY 10CC   Final    Culture  Setup Time 03/21/2012 09:28   Final    Culture NO GROWTH 5 DAYS   Final    Report Status 03/27/2012 FINAL   Final   CULTURE, BLOOD (ROUTINE X 2)      Status: Normal   Collection Time   03/21/12  3:20 AM      Component Value Range Status Comment   Specimen Description BLOOD RIGHT ARM   Final    Special Requests BOTTLES DRAWN AEROBIC AND ANAEROBIC 10CC EA   Final    Culture  Setup Time 03/21/2012 09:28   Final    Culture NO GROWTH 5 DAYS   Final    Report Status 03/27/2012 FINAL   Final   MRSA PCR SCREENING     Status: Normal   Collection Time   03/21/12 10:47 PM      Component Value Range Status Comment   MRSA by PCR NEGATIVE  NEGATIVE Final   WOUND CULTURE     Status: Normal   Collection Time   03/22/12  3:01 PM      Component Value Range Status Comment   Specimen Description WOUND ASPIRATE BACK   Final    Special Requests NONE   Final    Gram Stain     Final    Value: RARE WBC PRESENT, PREDOMINANTLY PMN     NO SQUAMOUS EPITHELIAL CELLS SEEN     NO ORGANISMS SEEN   Culture NO GROWTH 2 DAYS   Final    Report Status 03/24/2012 FINAL   Final     Anti-infectives     Start  Dose/Rate Route Frequency Ordered Stop   03/23/12 1800   cefTAZidime (FORTAZ) 2 g in dextrose 5 % 50 mL IVPB        2 g 100 mL/hr over 30 Minutes Intravenous Every T-Th-Sa (1800) 03/22/12 1814     03/23/12 1200   vancomycin (VANCOCIN) 500 mg in sodium chloride 0.9 % 100 mL IVPB        500 mg 100 mL/hr over 60 Minutes Intravenous Every T-Th-Sa (Hemodialysis) 03/22/12 1814     03/22/12 1930   vancomycin (VANCOCIN) 1,250 mg in sodium chloride 0.9 % 250 mL IVPB        1,250 mg 166.7 mL/hr over 90 Minutes Intravenous  Once 03/22/12 1813 03/22/12 2149   03/22/12 1845   cefTAZidime (FORTAZ) 1 g in dextrose 5 % 50 mL IVPB        1 g 100 mL/hr over 30 Minutes Intravenous  Once 03/22/12 1813 03/22/12 2017          Assessment: 79 y.o. F with ESRD who presented to the [facility] on 1/13 with back pain. A spinal MRI showed disc space edema and a small amount of fluid concerning for diskitis/osteomyelitis. IR was consulted for aspiration and then was sent for  cultures. The patient was started on Vancomycin + Ceftazidime on 03/22/12 while awaiting culture results --which have resulted as no growth.  The patient was loaded with Vancomycin 1250 mg on 1/15 and received an appropriate maintenance dose after dialyzing on 1/16 and 1/18. Today is D#6/42 of empiric coverage. Doses remain appropriate at this time. Will plan to check a pre-HD Vancomycin level tomorrow.  Vanc 1/15 >> Ceftaz 1/15 >>  1/15 Wound aspirate >> negative final 1/14 BCx >> ngtd  Goal of Therapy:  Pre-HD Vancomycin level of 15-25 mcg/ml  Proper antibiotics for infection/cultures adjusted for renal/hepatic function   Plan:  1. Continue Vancomycin 500 mg post HD sessions on T/Th/Sat 2. Continue Ceftazidime 2g post HD sessions on T/Th/Sat 3. Obtain a pre-HD Vancomycin level on Tues, 1/21 4. Will continue to follow HD schedule/duration, culture results, LOT, and antibiotic de-escalation plans  Georgina Pillion, PharmD, BCPS Clinical Pharmacist Pager: [phone] 03/27/2012 1:38 PM

## 2012-03-27 NOTE — Progress Notes (Signed)
[facility] Progress Note  Subjective:   Resting in bed. No complaints. Back not hurting; leg still hurts some Ready to each lunch  Objective Filed Vitals:   03/26/12 1752 03/26/12 2216 03/27/12 0619 03/27/12 0926  BP: 121/54 143/63 142/41 120/42  Pulse: 66 41 62 78  Temp: 98 F (36.7 C) 98 F (36.7 C) 98 F (36.7 C) 97.9 F (36.6 C)  TempSrc: Oral Oral Oral Oral  Resp: 18 18 18 16   Height:      Weight:  55.1 kg (121 lb 7.6 oz)    SpO2: 100% 98% 100% 100%   Physical Exam General: Chronically-ill appearing. NAD Heart: RRR Lungs: CTA bilaterally.  No wheezes, rales or rhonchi Abdomen: Soft, non-tender, non distended.  Normal BS Extremities: No LE edema Dialysis Access: RFA AVG with bruit  Dialysis Orders: Center: [facility] on TTS   EDW 58 kg HD Bath 2K+/ 2.25 Ca++ Time 4:00 Heparin 6000. Access RFA AVG BFR 400 DFR A1.5  Hectorol 2 mcg IV/HD Epogen 2400 Units IV/HD Venofer none. Has been leaving below EDW at OP center by about 0.5 kg prior to 03/14/12.    Assessment/Plan: 1. Back pain/? Lumbar discitis- Blood and aspirate cx's are negative. On abx, no further back pain. ID signed off, f/u ID clinic recommended 42d of abx (on vanc/fortaz now day #5)  2. Constipation - Resolved with Sorbitol. On Dulcolax BID. 3. ESRD, cont hd TTS [facility] 4. Hypertension/volume - SBPs 120s-140s on clonidine, lisinopril and norvasc  Under EDW by 3kg today. New EDW likely ~55 kg based on post wgts 5. Anemia - Hgb improving, 9.1  S/p 1 unit PRBC's on 1/18. Tsat 69% in Dec. Last Ferritin 800 in October. On Aranesp 100/wk, Stool cards for occult blood still pending. Will increase OP epogen at discharge  6. Metabolic bone disease - Hypercalcemia resolved Ca+ 8.9 (10.4 corrected) Phos 4.3. Last PTH 322.2 in October on Hectorol 2 mcg Q HD. 7. Nutrition - Albumin 2.6. High protein renal diet, multivitamin, Breeze supplement. 8. Hypothyroidism - high TSH, Synthroid dose increased per  primary 9. Dispo - Awaiting SNF, likely d/c tomorrow after HD per primary   Clydie Braun E. Thad Ranger [facility] [phone] pager 03/27/2012,10:18 AM  LOS: 7 days    Additional Objective Labs: Basic Metabolic Panel:  Lab 03/25/12 8295 03/23/12 0713 03/22/12 0049 03/21/12 1645  NA 136 138 136 --  K 3.8 4.2 3.9 --  CL 94* 97 95* --  CO2 26 27 26  --  GLUCOSE 163* 101* 97 --  BUN 29* 34* 21 --  CREATININE 10.66* 11.30* 8.10* --  CALCIUM 8.9 9.0 8.3* --  ALB -- -- -- --  PHOS 4.3 4.3 -- 4.9*   Liver Function Tests:  Lab 03/25/12 0850 03/23/12 0713 03/21/12 1645 03/20/12 2011  AST -- -- -- 12  ALT -- -- -- 5  ALKPHOS -- -- -- 72  BILITOT -- -- -- 0.3  PROT -- -- -- 7.1  ALBUMIN 2.6* 2.7* 2.9* --   CBC:  Lab 03/27/12 0757 03/25/12 0855 03/23/12 0713 03/22/12 0049 03/21/12 0115  WBC 7.7 7.0 7.6 -- --  NEUTROABS -- -- -- -- --  HGB 9.1* 7.1* 7.4* -- --  HCT 26.7* 20.8* 22.7* -- --  MCV 97.8 99.0 97.4 96.2 96.5  PLT 226 188 211 -- --   Blood Culture    Component Value Date/Time   SDES WOUND ASPIRATE BACK 03/22/2012 1501   SPECREQUEST NONE 03/22/2012 1501  CULT NO GROWTH 2 DAYS 03/22/2012 1501   REPTSTATUS 03/24/2012 FINAL 03/22/2012 1501    CBG:  Lab 03/25/12 1631  GLUCAP 104*   Medications:      . amLODipine  10 mg Oral QHS  . aspirin  81 mg Oral Daily  . bisacodyl  10 mg Rectal Once  . calcium acetate  1,334 mg Oral TID WC  . cefTAZidime (FORTAZ)  IV  2 g Intravenous Q T,Th,Sat-1800  . cloNIDine  0.2 mg Oral BID  . darbepoetin (ARANESP) injection - DIALYSIS  100 mcg Intravenous Q Sat-HD  . docusate sodium  100 mg Oral BID  . feeding supplement  1 Container Oral TID BM  . heparin  1,100 Units Intravenous Once  . heparin  5,000 Units Subcutaneous Q8H  . levothyroxine  150 mcg Oral QAC breakfast  . lisinopril  40 mg Oral QHS  . meloxicam  15 mg Oral Daily  . multivitamin  1 tablet Oral QHS  . polyethylene glycol  17 g Oral Daily  . sodium  chloride  3 mL Intravenous Q12H  . vancomycin  500 mg Intravenous Q T,Th,Sa-HD   I have seen and examined this patient and agree with plan as outlined above.  To complete 42 days of V/F for culture negative discitis.  Await SNF. Possible D/c next couple of days??  Nex HD 03/28/12 Kenyata Guess B,MD 03/27/2012 12:47 PM

## 2012-03-27 NOTE — Progress Notes (Signed)
[facility] Progress Note   Nicole [name] ZOX:[id number] DOB: Dec 16, 1932 DOA: 03/20/2012 PCP: [name], MD  Brief narrative: 79 year old female patient with chronic kidney disease who presented to the ER because of 2 weeks of constant sharp pain in her lower back primarily on the right side with radiculopathy to the right leg and buttock. Denied any recent injuries fall or trauma. Increased pain on ambulation. Was evaluated in the emergency department on 03/18/2012 for the same but was discharged home. Was taking Vicodin at home without improvement in the pain. In the ER patient had uncontrolled malignant hypertension with highest blood pressure to 86/74 in the setting of severe pain. X-rays including MRI of the lumbar spine revealed question of discitis or osteomyelitis in the L2/L3 region. Patient did require IV labetalol infusion initially to control her blood pressure but she developed bradycardia with heart rates in the 30s so this was discontinued and since that time patient's blood pressure although not well controlled is much better controlled.  Assessment/Plan:   *Acute back pain/ ? Osteomyelitis vs diskitis spine -Back pain is better  -Continue supportive care with analgesics - ESR mildly elevated at 38 and CRP low at 0.5 - IR aspirated L2-L3 disk space on 1/1/514 - culture without growth  - empiric antibiotics initiated on 03/22/12 by Dr. Ninetta Lights ID.  -plan for 42 days total of iv abx with HD.     Abnormal involuntary movements -Patient presented to the hospital with uncontrolled hypertension since admission has developed dyskinetic involuntary movements involving the right side of the face and upper extremities -Neurology has been consulted but the overall consensus is that she had uremia from non compliance dialysis. - MRI negative for acute stroke - EEG negative for epileptiform discharges  - symptoms resolved completely after 2 HD sessions.      PVD  (peripheral vascular disease) -Given underlying vascular disease the radiculopathy and pain that occurs with activity could also be a sign of claudication so may benefit from lower extremity arterial Dopplers as outpatient    HTN (hypertension), malignant -Diastolic blood pressure uncontrolled prior to HD. -Normally dialyzes on Tuesdays Thursdays and Saturdays  -Currently on amlodipine, lisinopril for blood pressure- with good control     CKD (chronic kidney disease) stage V requiring chronic dialysis -Dialyzes Tuesday Thursday Saturday,    Hypothyroidism TSH very elevated -Continue Synthroid but increased dose to 150 mcg daily   Anemia in chronic kidney disease(285.21) -Hemoglobin stable  Constipation  Started LOCs   DVT prophylaxis: Subcutaneous heparin Code Status: Full Family Communication: Spoke with patient Disposition Plan: SNF   Consultants: Nephrology Neurology ID Procedures: Aspirate of L2-L3 disk space  EEG  Antibiotics: Vanc1/15 [name] 1/15  HPI/Subjective: No new events   Objective: Blood pressure 142/41, pulse 62, temperature 98 F (36.7 C), temperature source Oral, resp. rate 16, height 5' (1.524 m), weight 55.1 kg (121 lb 7.6 oz), SpO2 100.00%.  Intake/Output Summary (Last 24 hours) at 03/27/12 0926 Last data filed at 03/26/12 1700  Gross per 24 hour  Intake    600 ml  Output      0 ml  Net    600 ml   Patient Vitals for the past 24 hrs:  BP Temp Temp src Pulse Resp SpO2 Weight  03/27/12 0926 - - Oral - 16  - -  03/27/12 0619 142/41 mmHg 98 F (36.7 C) Oral 62  18  100 % -  03/26/12 2216 143/63 mmHg 98 F (36.7 C) Oral 41  18  98 % 55.1 kg (121 lb 7.6 oz)  03/26/12 1752 121/54 mmHg 98 F (36.7 C) Oral 66  18  100 % -  03/26/12 1356 129/58 mmHg 97.8 F (36.6 C) Oral 60  18  100 % -  03/26/12 1000 120/51 mmHg 97.8 F (36.6 C) Oral 60  18  100 % -      Exam: axox3 CVS: RRR RS; CTAB Abdomen : soft, nt  Data Reviewed: Basic  Metabolic Panel:  Lab 03/25/12 9562 03/23/12 0713 03/22/12 0049 03/21/12 1645 03/20/12 2114  NA 136 138 136 135 137  K 3.8 4.2 3.9 4.3 4.2  CL 94* 97 95* 92* 98  CO2 26 27 26 24  --  GLUCOSE 163* 101* 97 121* 86  BUN 29* 34* 21 67* 61*  CREATININE 10.66* 11.30* 8.10* 18.13* 14.60*  CALCIUM 8.9 9.0 8.3* 8.5 --  MG -- -- -- -- --  PHOS 4.3 4.3 -- 4.9* --   Liver Function Tests:  Lab 03/25/12 0850 03/23/12 0713 03/21/12 1645 03/20/12 2011  AST -- -- -- 12  ALT -- -- -- 5  ALKPHOS -- -- -- 72  BILITOT -- -- -- 0.3  PROT -- -- -- 7.1  ALBUMIN 2.6* 2.7* 2.9* 3.3*   No results found for this basename: LIPASE:5,AMYLASE:5 in the last 168 hours No results found for this basename: AMMONIA:5 in the last 168 hours CBC:  Lab 03/27/12 0757 03/25/12 0855 03/23/12 0713 03/22/12 0049 03/21/12 0115  WBC 7.7 7.0 7.6 8.0 8.4  NEUTROABS -- -- -- -- --  HGB 9.1* 7.1* 7.4* 9.5* 9.4*  HCT 26.7* 20.8* 22.7* 28.1* 27.6*  MCV 97.8 99.0 97.4 96.2 96.5  PLT 226 188 211 204 179   Cardiac Enzymes: No results found for this basename: CKTOTAL:5,CKMB:5,CKMBINDEX:5,TROPONINI:5 in the last 168 hours BNP (last 3 results) No results found for this basename: PROBNP:3 in the last 8760 hours CBG:  Lab 03/25/12 1631  GLUCAP 104*    Recent Results (from the past 240 hour(s))  URINE CULTURE     Status: Normal   Collection Time   03/18/12  6:58 AM      Component Value Range Status Comment   Specimen Description URINE, RANDOM   Final    Special Requests NONE   Final    Culture  Setup Time 03/18/2012 15:55   Final    Colony Count >=100,000 COLONIES/ML   Final    Culture     Final    Value: Multiple bacterial morphotypes present, none predominant. Suggest appropriate recollection if clinically indicated.   Report Status 03/19/2012 FINAL   Final   CULTURE, BLOOD (ROUTINE X 2)     Status: Normal   Collection Time   03/21/12  3:18 AM      Component Value Range Status Comment   Specimen Description BLOOD LEFT  HAND   Final    Special Requests BOTTLES DRAWN AEROBIC ONLY 10CC   Final    Culture  Setup Time 03/21/2012 09:28   Final    Culture NO GROWTH 5 DAYS   Final    Report Status 03/27/2012 FINAL   Final   CULTURE, BLOOD (ROUTINE X 2)     Status: Normal   Collection Time   03/21/12  3:20 AM      Component Value Range Status Comment   Specimen Description BLOOD RIGHT ARM   Final    Special Requests BOTTLES DRAWN AEROBIC AND ANAEROBIC 10CC EA   Final  Culture  Setup Time 03/21/2012 09:28   Final    Culture NO GROWTH 5 DAYS   Final    Report Status 03/27/2012 FINAL   Final   MRSA PCR SCREENING     Status: Normal   Collection Time   03/21/12 10:47 PM      Component Value Range Status Comment   MRSA by PCR NEGATIVE  NEGATIVE Final   WOUND CULTURE     Status: Normal   Collection Time   03/22/12  3:01 PM      Component Value Range Status Comment   Specimen Description WOUND ASPIRATE BACK   Final    Special Requests NONE   Final    Gram Stain     Final    Value: RARE WBC PRESENT, PREDOMINANTLY PMN     NO SQUAMOUS EPITHELIAL CELLS SEEN     NO ORGANISMS SEEN   Culture NO GROWTH 2 DAYS   Final    Report Status 03/24/2012 FINAL   Final      Studies:  Recent x-ray studies have been reviewed in detail by the Attending Physician  Scheduled    . amLODipine  10 mg Oral QHS  . aspirin  81 mg Oral Daily  . bisacodyl  10 mg Rectal Once  . calcium acetate  1,334 mg Oral TID WC  . cefTAZidime (FORTAZ)  IV  2 g Intravenous Q T,Th,Sat-1800  . cloNIDine  0.2 mg Oral BID  . darbepoetin (ARANESP) injection - DIALYSIS  100 mcg Intravenous Q Sat-HD  . docusate sodium  100 mg Oral BID  . feeding supplement  1 Container Oral TID BM  . heparin  1,100 Units Intravenous Once  . heparin  5,000 Units Subcutaneous Q8H  . levothyroxine  150 mcg Oral QAC breakfast  . lisinopril  40 mg Oral QHS  . meloxicam  15 mg Oral Daily  . multivitamin  1 tablet Oral QHS  . polyethylene glycol  17 g Oral Daily  .  sodium chloride  3 mL Intravenous Q12H  . vancomycin  500 mg Intravenous Joseph Art   Pager [phone]  On-Call/Text Page:      [name]      password [email or web address]  If 7PM-7AM, please contact night-coverage [email or web address] Password [email or web address] 03/27/2012, 9:26 AM   LOS: 7 days

## 2012-03-27 NOTE — Clinical Social Work Note (Signed)
Patient assessed and bed search initiated. CSW talked with patient's son at 8:02 pm and patient has chosen [facility]. CSW will follow-up with facility on 1/21 and patient will d/c to a facility same date. Full assessment note to follow.  Genelle Bal, MSW, LCSW [phone]

## 2012-03-28 LAB — CBC
MCH: 32.6 pg (ref 26.0–34.0)
MCH: 33.7 pg (ref 26.0–34.0)
MCHC: 33.2 g/dL (ref 30.0–36.0)
MCV: 97.8 fL (ref 78.0–100.0)
Platelets: 307 10*3/uL (ref 150–400)
RBC: 2.7 MIL/uL — ABNORMAL LOW (ref 3.87–5.11)
RDW: 15.5 % (ref 11.5–15.5)
RDW: 15.3 % (ref 11.5–15.5)
WBC: 6.8 10*3/uL (ref 4.0–10.5)

## 2012-03-28 LAB — VANCOMYCIN, RANDOM: Vancomycin Rm: 13.1 ug/mL

## 2012-03-28 MED ORDER — VANCOMYCIN HCL 500 MG IV SOLR: 500.0000 mg | INTRAVENOUS | Status: DC

## 2012-03-28 MED ORDER — VANCOMYCIN HCL 1000 MG IV SOLR
750.0000 mg | INTRAVENOUS | Status: AC
  Administered 2012-03-28: 750 mg via INTRAVENOUS
  Filled 2012-03-28: qty 750

## 2012-03-28 MED ORDER — HYDROCODONE-ACETAMINOPHEN 5-325 MG PO TABS
ORAL_TABLET | ORAL | Status: AC
  Administered 2012-03-28: 1 via ORAL
  Filled 2012-03-28: qty 1

## 2012-03-28 MED ORDER — HYDROCODONE-ACETAMINOPHEN 5-325 MG PO TABS: 1.0000 | ORAL_TABLET | Freq: Four times a day (QID) | ORAL | Status: DC | PRN

## 2012-03-28 MED ORDER — BOOST / RESOURCE BREEZE PO LIQD: 1.0000 | Freq: Three times a day (TID) | ORAL | Status: DC

## 2012-03-28 MED ORDER — DEXTROSE 5 % IV SOLN: 2.0000 g | INTRAVENOUS | Status: DC

## 2012-03-28 MED ORDER — LEVOTHYROXINE SODIUM 150 MCG PO TABS: 150.0000 ug | ORAL_TABLET | Freq: Every day | ORAL | Status: DC

## 2012-03-28 NOTE — Progress Notes (Signed)
ANTIBIOTIC CONSULT NOTE - FOLLOW UP  Pharmacy Consult for Vancomycin + Ceftazidime Indication: Empiric osteomyelitis vs diskitis spine  No Known Allergies  Patient Measurements: Height: 5' (152.4 cm) Weight: 121 lb 11.1 oz (55.2 kg) IBW/kg (Calculated) : 45.5   Vital Signs: Temp: 98.1 F (36.7 C) (01/21 0730) Temp src: Oral (01/21 0730) BP: 126/47 mmHg (01/21 0900) Pulse Rate: 41  (01/21 0900) Intake/Output from previous day: 01/20 0701 - 01/21 0700 In: 240 [P.O.:240] Out: -  Intake/Output from this shift:    Labs:  Basename 03/28/12 0753 03/27/12 0757  WBC 7.5 7.7  HGB 8.4* 9.1*  PLT 259 226  LABCREA -- --  CREATININE -- --   Estimated Creatinine Clearance: 3.4 ml/min (by C-G formula based on Cr of 10.66).  Basename 03/28/12 0757  VANCOTROUGH --  Leodis Binet --  VANCORANDOM 13.1  GENTTROUGH --  GENTPEAK --  GENTRANDOM --  TOBRATROUGH --  TOBRAPEAK --  TOBRARND --  AMIKACINPEAK --  AMIKACINTROU --  AMIKACIN --     Microbiology: Recent Results (from the past 720 hour(s))  URINE CULTURE     Status: Normal   Collection Time   03/18/12  6:58 AM      Component Value Range Status Comment   Specimen Description URINE, RANDOM   Final    Special Requests NONE   Final    Culture  Setup Time 03/18/2012 15:55   Final    Colony Count >=100,000 COLONIES/ML   Final    Culture     Final    Value: Multiple bacterial morphotypes present, none predominant. Suggest appropriate recollection if clinically indicated.   Report Status 03/19/2012 FINAL   Final   CULTURE, BLOOD (ROUTINE X 2)     Status: Normal   Collection Time   03/21/12  3:18 AM      Component Value Range Status Comment   Specimen Description BLOOD LEFT HAND   Final    Special Requests BOTTLES DRAWN AEROBIC ONLY 10CC   Final    Culture  Setup Time 03/21/2012 09:28   Final    Culture NO GROWTH 5 DAYS   Final    Report Status 03/27/2012 FINAL   Final   CULTURE, BLOOD (ROUTINE X 2)     Status: Normal   Collection Time   03/21/12  3:20 AM      Component Value Range Status Comment   Specimen Description BLOOD RIGHT ARM   Final    Special Requests BOTTLES DRAWN AEROBIC AND ANAEROBIC 10CC EA   Final    Culture  Setup Time 03/21/2012 09:28   Final    Culture NO GROWTH 5 DAYS   Final    Report Status 03/27/2012 FINAL   Final   MRSA PCR SCREENING     Status: Normal   Collection Time   03/21/12 10:47 PM      Component Value Range Status Comment   MRSA by PCR NEGATIVE  NEGATIVE Final   WOUND CULTURE     Status: Normal   Collection Time   03/22/12  3:01 PM      Component Value Range Status Comment   Specimen Description WOUND ASPIRATE BACK   Final    Special Requests NONE   Final    Gram Stain     Final    Value: RARE WBC PRESENT, PREDOMINANTLY PMN     NO SQUAMOUS EPITHELIAL CELLS SEEN     NO ORGANISMS SEEN   Culture NO GROWTH 2 DAYS   Final  Report Status 03/24/2012 FINAL   Final     Anti-infectives     Start     Dose/Rate Route Frequency Ordered Stop   03/30/12 1200   vancomycin (VANCOCIN) 500 mg in sodium chloride 0.9 % 100 mL IVPB        500 mg 100 mL/hr over 60 Minutes Intravenous Every T-Th-Sa (Hemodialysis) 03/28/12 0859     03/28/12 1200   vancomycin (VANCOCIN) 750 mg in sodium chloride 0.9 % 150 mL IVPB        750 mg 150 mL/hr over 60 Minutes Intravenous Every Tue (Hemodialysis) 03/28/12 0859 04/04/12 1159   03/28/12 0000   dextrose 5 % SOLN 50 mL with cefTAZidime 2 G SOLR 2 g        2 g 100 mL/hr over 30 Minutes Intravenous 3 times weekly 03/28/12 0840     03/28/12 0000   sodium chloride 0.9 % SOLN 100 mL with vancomycin 500 MG SOLR 500 mg        500 mg 100 mL/hr over 60 Minutes Intravenous 3 times weekly 03/28/12 0840     03/23/12 1800   cefTAZidime (FORTAZ) 2 g in dextrose 5 % 50 mL IVPB        2 g 100 mL/hr over 30 Minutes Intravenous Every T-Th-Sa (1800) 03/22/12 1814     03/23/12 1200   vancomycin (VANCOCIN) 500 mg in sodium chloride 0.9 % 100 mL IVPB  Status:   Discontinued        500 mg 100 mL/hr over 60 Minutes Intravenous Every T-Th-Sa (Hemodialysis) 03/22/12 1814 03/28/12 0859   03/22/12 1930   vancomycin (VANCOCIN) 1,250 mg in sodium chloride 0.9 % 250 mL IVPB        1,250 mg 166.7 mL/hr over 90 Minutes Intravenous  Once 03/22/12 1813 03/22/12 2149   03/22/12 1845   cefTAZidime (FORTAZ) 1 g in dextrose 5 % 50 mL IVPB        1 g 100 mL/hr over 30 Minutes Intravenous  Once 03/22/12 1813 03/22/12 2017          Assessment: 79 y.o. F with ESRD who presented to the [facility] on 1/13 with back pain. A spinal MRI showed disc space edema and a small amount of fluid concerning for diskitis/osteomyelitis. IR was consulted for aspiration and then was sent for cultures. The patient was started on Vancomycin + Ceftazidime on 03/22/12 while awaiting culture results --which have resulted as no growth.  Today is D#7/42 of empiric coverage. The patient was loaded with Vancomycin 1250 mg on 1/15 and has received appropriate maintenance doses after HD sessions on 1/16 and 1/18. A pre-HD Vancomycin level today was slightly SUBtherapeutic (Vanc level~13.1, goal of 15-25 mcg/ml). Will give a higher dose of Vancomycin post-HD today, and then continue with normal maintenance doses. Ceftazidime doses remain appropriate.   It is noted that the patient is to be on antibiotics x 6 weeks per ID. Would recommend weekly pre-HD Vancomycin levels as an outpatient (at the HD center) to ensure appropriateness of Vancomycin doses.   Vanc 1/15 >> Ceftaz 1/15 >>  1/15 Wound aspirate >> negative final 1/14 BCx >> ngtd  Goal of Therapy:  Pre-HD Vancomycin level of 15-25 mcg/ml  Proper antibiotics for infection/cultures adjusted for renal/hepatic function   Plan:  1. Vancomycin 750 mg IV post HD session today (1/21)  2. Continue Vancomycin 500 mg IV post HD sessions on T/Th/Sat (starting back on Thurs, 1/23)  3. Continue Ceftazidime 2g IV post  HD sessions on T/Th/Sat  4. Upon  discharge, would recommend weekly Vancomycin levels at the HD center for monitoring of dose appropriateness 5. Will continue to follow HD schedule/duration, culture results, LOT, and antibiotic de-escalation plans   Georgina Pillion, PharmD, BCPS Clinical Pharmacist Pager: [phone] 03/28/2012 9:05 AM

## 2012-03-28 NOTE — Clinical Social Work Psychosocial (Signed)
Clinical Social Work Department BRIEF PSYCHOSOCIAL ASSESSMENT 03/28/2012  Patient:  Nicole [name], Nicole [name]     Account Number:  [id number]     Admit date:  03/20/2012  Clinical Social Worker:  Delmer Islam  Date/Time:  03/28/2012 12:29 PM  Referred by:  Physician  Date Referred:  03/26/2012 Referred for  SNF Placement   Other Referral:   Interview type:   Other interview type:    PSYCHOSOCIAL DATA Living Status:  FAMILY Admitted from facility:   Level of care:   Primary support name:  Nicole [name] Primary support relationship to patient:  CHILD, ADULT Degree of support available:   Good support    CURRENT CONCERNS Current Concerns  Post-Acute Placement   Other Concerns:    SOCIAL WORK ASSESSMENT / PLAN On 03/27/12 CSW talked with patient at the bedside and then later same date with son by phone regarding discharge plans. Both patient and son in agreement with short-term rehab. Patient reported that she has never been to a skilled nursing facility. She was given a SNF list for [facility] and encouraged to talk with her son to assist in picking a facility.    During conversation with son on 1/20, CSW explained SNF search process and requested that he assist patient with SNF choice. Son was agreeable to assisting patient.   Assessment/plan status:  Psychosocial Support/Ongoing Assessment of Needs Other assessment/ plan:   Information/referral to community resources:   Patient given SNF list for [facility]    PATIENT'S/FAMILY'S RESPONSE TO PLAN OF CARE: Patient and son agreeable to SNF. CSW received a call from son at 8:02 pm on 1/20 advising that SNF choice is [facility].

## 2012-03-28 NOTE — Progress Notes (Signed)
[facility] Progress Note  Subjective:   Seen on HD. No complaints of back or leg pain. " I'm just ready to get better so I can get out of here."  Objective Filed Vitals:   03/28/12 0900 03/28/12 0930 03/28/12 1000 03/28/12 1018  BP: 126/47 115/59 113/51 116/58  Pulse: 41 39 46 58  Temp:      TempSrc:      Resp:    18  Height:      Weight:      SpO2:       Physical Exam General: Resting in bed. Appropriate but despondent. Heart: RRR Lungs: CTA bilaterally.  No wheezes or rhonchi Abdomen: Soft, non-tender, non-distended. Normal BS Extremities: No LE edema Dialysis Access: RFA AVG in use  Dialysis Orders: Center: [facility] on TTS  EDW 58 kg HD Bath 2K+/ 2.25 Ca++ Time 4:00 Heparin 6000. Access RFA AVG BFR 400 DFR A1.5  Hectorol 2 mcg IV/HD Epogen 2400 Units IV/HD Venofer none. Has been leaving below EDW at OP center by about 0.5 kg prior to 03/14/12.    Assessment/Plan: 1. Back pain/? Lumbar discitis- Blood and aspirate cx's are negative. On abx, no further back pain. ID signed off, f/u ID clinic recommended 42d of abx - now on Day 6. At discharge will continue Vancomycin 500 mg IV and Fortaz 2g IV QHD through 05/04/12. Weekly Vanc levels per pharmacy. 2. Constipation - Resolved with Sorbitol. On Dulcolax BID. 3. ESRD, cont hd TTS [facility] 4. Hypertension/volume - SBPs 110s- 120s on clonidine, lisinopril and norvasc Under EDW by 3kg today. New EDW likely ~55 kg. HD post wgt today to verify new EDW. 5. Anemia - Hgb 8.4 S/p 1 unit PRBC's on 1/18. Tsat 69% in Dec. Last Ferritin 800 in October. On Aranesp 100/wk, Stool cards for occult blood still pending. Will increase OP epogen at discharge  6. Metabolic bone disease - Hypercalcemia resolved Ca+ 8.9 (10.4 corrected) Phos 4.3. Last PTH 322.2 in October on Hectorol 2 mcg Q HD. 7. Nutrition - Albumin 2.6. High protein renal diet, multivitamin, Breeze supplement. 8. Hypothyroidism - high TSH, Synthroid dose increased per  primary 9. Dispo - Discharge home today per primary.    Scot Jun. Broadus John, PA-C [facility] [phone] pager 03/28/2012,10:27 AM  LOS: 8 days   I have seen and examined this patient and agree with plan as outlined in the note above.  Vanco and fortaz through 05/04/12; new EDW.  For D/C home today Bufford Helms B,MD 03/28/2012 10:59 AM  Additional Objective Labs: Basic Metabolic Panel:  Lab 03/25/12 9562 03/23/12 0713 03/22/12 0049 03/21/12 1645  NA 136 138 136 --  K 3.8 4.2 3.9 --  CL 94* 97 95* --  CO2 26 27 26  --  GLUCOSE 163* 101* 97 --  BUN 29* 34* 21 --  CREATININE 10.66* 11.30* 8.10* --  CALCIUM 8.9 9.0 8.3* --  ALB -- -- -- --  PHOS 4.3 4.3 -- 4.9*   Liver Function Tests:  Lab 03/25/12 0850 03/23/12 0713 03/21/12 1645  AST -- -- --  ALT -- -- --  ALKPHOS -- -- --  BILITOT -- -- --  PROT -- -- --  ALBUMIN 2.6* 2.7* 2.9*   No results found for this basename: LIPASE:3,AMYLASE:3 in the last 168 hours CBC:  Lab 03/28/12 0845 03/28/12 0753 03/27/12 0757 03/25/12 0855 03/23/12 0713  WBC 6.8 7.5 7.7 -- --  NEUTROABS -- -- -- -- --  HGB 9.1* 8.4* 9.1* -- --  HCT 26.4* 25.3* 26.7* -- --  MCV 97.8 98.1 97.8 99.0 97.4  PLT 307 259 226 -- --   Blood Culture    Component Value Date/Time   SDES WOUND ASPIRATE BACK 03/22/2012 1501   SPECREQUEST NONE 03/22/2012 1501   CULT NO GROWTH 2 DAYS 03/22/2012 1501   REPTSTATUS 03/24/2012 FINAL 03/22/2012 1501    CBG:  Lab 03/25/12 1631  GLUCAP 104*   Medications:      . amLODipine  10 mg Oral QHS  . aspirin  81 mg Oral Daily  . bisacodyl  10 mg Rectal Once  . calcium acetate  1,334 mg Oral TID WC  . cefTAZidime (FORTAZ)  IV  2 g Intravenous Q T,Th,Sat-1800  . cloNIDine  0.2 mg Oral BID  . darbepoetin (ARANESP) injection - DIALYSIS  100 mcg Intravenous Q Sat-HD  . docusate sodium  100 mg Oral BID  . feeding supplement  1 Container Oral TID BM  . heparin  1,100 Units Intravenous Once  . heparin  5,000  Units Subcutaneous Q8H  . levothyroxine  150 mcg Oral QAC breakfast  . lisinopril  40 mg Oral QHS  . meloxicam  15 mg Oral Daily  . multivitamin  1 tablet Oral QHS  . polyethylene glycol  17 g Oral Daily  . sodium chloride  3 mL Intravenous Q12H  . vancomycin  500 mg Intravenous Q T,Th,Sa-HD  . vancomycin  750 mg Intravenous Q Tue-HD

## 2012-03-28 NOTE — Clinical Social Work Placement (Addendum)
Clinical Social Work Department CLINICAL SOCIAL WORK PLACEMENT NOTE 03/28/2012  Patient:  Nicole [name], Nicole [name]  Account Number:  [id number] Admit date:  03/20/2012  Clinical Social Worker:  Genelle Bal, LCSW  Date/time:  03/28/2012 12:46 PM  Clinical Social Work is seeking post-discharge placement for this patient at the following level of care:   SKILLED NURSING   (*CSW will update this form in Epic as items are completed)   03/27/2012  Patient/family provided with [facility] Department of Clinical Social Work's list of facilities offering this level of care within the geographic area requested by the patient (or if unable, by the patient's family).  03/27/2012  Patient/family informed of their freedom to choose among providers that offer the needed level of care, that participate in Medicare, Medicaid or managed care program needed by the patient, have an available bed and are willing to accept the patient.    Patient/family informed of [facility]' ownership interest in [facility], as well as of the fact that they are under no obligation to receive care at this facility.  PASARR submitted to EDS on 03/27/2012 PASARR number received from [employer] on 03/28/2012  FL2 transmitted to all facilities in geographic area requested by pt/family on  03/28/2012 FL2 transmitted to all facilities within larger geographic area on   Patient informed that his/her managed care company has contracts with or will negotiate with  certain facilities, including the following:     Patient/family informed of bed offers received:  03/28/2012 Patient chooses bed at [facility] Physician recommends and patient chooses bed at    Patient to be transferred to [facility] on  03/28/2012 Patient to be transferred to facility by ambulance  The following physician request were entered in Epic:   Additional Comments:

## 2012-03-28 NOTE — Discharge Summary (Signed)
Physician Discharge Summary  DERIONA ALTEMOSE BMW:[id number] DOB: 24-Jun-1932 DOA: 03/20/2012  PCP: [name], MD  Admit date: 03/20/2012 Discharge date: 03/28/2012  Time spent: 40  minutes  Recommendations for Outpatient Follow-up:  1. Follow up with ID   Discharge Diagnoses:   Diskitis spine L2-L3 - s/p aspiration - plan for 5 more weeks of iv abx with HD   PVD (peripheral vascular disease)  Hypothyroidism  Anemia in chronic kidney disease(285.21)  HTN (hypertension), malignant  CKD (chronic kidney disease) stage V requiring chronic dialysis  Abnormal involuntary movements due to uremia resolved    Discharge Condition: fair  Diet recommendation: renal   Filed Weights   03/25/12 2103 03/26/12 2216 03/28/12 0730  Weight: 55.4 kg (122 lb 2.2 oz) 55.1 kg (121 lb 7.6 oz) 55.2 kg (121 lb 11.1 oz)    History of present illness:  Patient is a 79 year old female with end-stage renal disease on hemodialysis TTS, peripherovascular disease, hypertension who presented to the ER today with 2 weeks of constant, sharp pain in her lower back mostly on the right side and radiates down to the right buttocks into the right leg. She denies any injuries, fall or any trauma. Patient states that the pain was worsened on ambulation. Patient otherwise denied any urinary symptoms. She was seen in the ED on 03/18/2012 for the same and was discharged home. Patient says that she was taking Vicodin at home with no improvement in the pain.  ED workup also showed uncontrolled malignant hypertension with highest BP of 286/74. MRI of the lumbar spine was done, final results are still pending but preliminary results shows probable discitis or osteomyelitis in the L2-L3 region.      Hospital Course:  *Acute back pain/ probably from  Osteomyelitis  And L2-L3  diskitis spine  -Back pain is better after aspiration  -Continue supportive care with analgesics  - ESR mildly elevated at 38 and CRP low at 0.5  - IR  aspirated L2-L3 disk space on 1/1/514 - culture without growth  - empiric antibiotics initiated on 03/22/12 by Dr. Ninetta Lights ID.  -plan for 42 days total of iv abx with HD. Vanc and [place] outpt f/u with ID  Abnormal involuntary movements  -Patient presented to the hospital with uncontrolled hypertension since admission has developed dyskinetic involuntary movements involving the right side of the face and upper extremities  -Neurology has been consulted but the overall consensus is that she had uremia from non compliance dialysis.  - MRI negative for acute stroke  - EEG negative for epileptiform discharges  - symptoms resolved completely after 2 HD sessions.  PVD (peripheral vascular disease)  -Given underlying vascular disease the radiculopathy and pain that occurs with activity could also be a sign of claudication so may benefit from lower extremity arterial Dopplers as outpatient  HTN (hypertension), malignant  -Diastolic blood pressure uncontrolled prior to HD.  -Normally dialyzes on Tuesdays Thursdays and Saturdays  -Currently on amlodipine, lisinopril for blood pressure- with good control  CKD (chronic kidney disease) stage V requiring chronic dialysis  -Dialyzes Tuesday Thursday Saturday,  Hypothyroidism  TSH very elevated -Continue Synthroid but increased dose to 150 mcg daily  Anemia in chronic kidney disease(285.21)  -Hemoglobin stable      Procedures:  IR aspiration   Consultations:  ID  Renal  Discharge Exam: Filed Vitals:   03/28/12 0800 03/28/12 0830 03/28/12 0900 03/28/12 0930  BP: 108/43 101/51 126/47 115/59  Pulse: 40 40 41 39  Temp:  TempSrc:      Resp:      Height:      Weight:      SpO2:        General: axox3 Cardiovascular: rrr Respiratory: ctab   Discharge Instructions      Discharge Orders    Future Orders Please Complete By Expires   Diet renal 60/[id number]      Increase activity slowly          Medication List     As of  03/28/2012  9:55 AM    TAKE these medications         amLODipine 10 MG tablet   Commonly known as: NORVASC   Take 10 mg by mouth at bedtime.      aspirin 81 MG EC tablet   Take 81 mg by mouth daily.      calcium acetate 667 MG capsule   Commonly known as: PHOSLO   Take 2 capsules (1,334 mg total) by mouth 3 (three) times daily with meals.      dextrose 5 % SOLN 50 mL with cefTAZidime 2 G SOLR 2 g   Inject 2 g into the vein 3 (three) times a week.      feeding supplement Liqd   Take 1 Container by mouth 3 (three) times daily between meals.      HYDROcodone-acetaminophen 5-325 MG per tablet   Commonly known as: NORCO/VICODIN   Take 1 tablet by mouth every 6 (six) hours as needed for pain.      ibuprofen 400 MG tablet   Commonly known as: ADVIL,MOTRIN   Take 1 tablet (400 mg total) by mouth every 6 (six) hours as needed for pain.      levothyroxine 150 MCG tablet   Commonly known as: SYNTHROID, LEVOTHROID   Take 1 tablet (150 mcg total) by mouth daily before breakfast.      lisinopril 40 MG tablet   Commonly known as: PRINIVIL,ZESTRIL   Take 40 mg by mouth at bedtime.      meloxicam 15 MG tablet   Commonly known as: MOBIC   Take 1 tablet (15 mg total) by mouth daily.      multivitamin Tabs tablet   Take 1 tablet by mouth at bedtime.      polyethylene glycol powder powder   Commonly known as: GLYCOLAX/MIRALAX   Take 17 g by mouth 2 (two) times daily. Until daily soft stools    OTC      sodium chloride 0.9 % SOLN 100 mL with vancomycin 500 MG SOLR 500 mg   Inject 500 mg into the vein 3 (three) times a week.         Follow-up Information    Follow up with [name], MD.   Contact information:   [address] [facility] [place] [address] [phone]       Schedule an appointment as soon as possible for a visit with Johny Sax, MD.   Contact information:   [address]  [address] [place]  [place] [address] [phone]           The results of significant diagnostics from this hospitalization (including imaging, microbiology, ancillary and laboratory) are listed below for reference.    Significant Diagnostic Studies: Dg Lumbar Spine Complete  03/20/2012  *RADIOLOGY REPORT*  Clinical Data: Back and leg pain.  LUMBAR SPINE - COMPLETE 4+ VIEW  Comparison: None.  Findings: There is irregular destruction of the  endplates about the L2-3 interspace.  There is some sclerosis in the adjacent vertebral bodies.  There is some loss of height of L3 anteriorly.  There is marked narrowing of the L4-5 interspace.  Probable L4 pars defects allow approximately 12 mm anterolisthesis of L4 on L5. Vascular clips in the mid abdomen and over the left hip. Patchy aortic calcifications.  IMPRESSION:  1.  Probable diskitis/osteomyelitis L2-3.  Consider MRI lumbar spine with contrast for further characterization. 2.  Advanced degenerative disc disease L4-5 with grade II anterolisthesis related to L4 pars defects.   Original Report Authenticated By: D. Andria Rhein, MD    Mr Brain Wo Contrast  03/22/2012  *RADIOLOGY REPORT*  Clinical Data: 79 year old female with severe back pain.  End-stage renal disease.  Diskitis. Weakness.  MRI HEAD WITHOUT CONTRAST  Technique:  Multiplanar, multiecho pulse sequences of the brain and surrounding structures were obtained according to standard protocol without intravenous contrast.  Comparison: None.  Findings: Bone marrow signal the skull within normal limits.  There is decreased marrow signal intermittently in the cervical spine which appears to be degenerative/sclerotic.  Multiple chronic small lacunar infarcts in the brainstem. Heterogeneous diffusion signal in stem, no convincing area of restricted diffusion to suggest acute infarct.  Absent of the distal right vertebral artery flow void.  Other Major intracranial vascular flow voids are preserved.  Chronic lacunar infarct anterior  right corona radiata, anterior right caudate, extending to the right external capsule.  Patchy other cerebral white matter T2 and FLAIR hyperintensity. Other deep gray matter nuclei are within normal limits.  No cortical encephalomalacia.  No midline shift, mass effect, or evidence of mass lesion.  No ventriculomegaly. No acute intracranial hemorrhage identified. Negative pituitary and cervicomedullary junction.  Postoperative changes to the globes.  Minimal paranasal sinus mucosal thickening.  Mastoids are clear.  Scalp soft tissues are within normal limits.  There is some T2 heterogeneity of the left tonsillar pillar noted (series 10 image 10) which may relate to a benign inclusion cyst.  There is also suggestion of heterogeneity of the left parotid gland (image 13) not included on the other imaging planes.  Favor benign intraparotid lymph node.  IMPRESSION: 1. No acute intracranial abnormality. 2.  Chronic small vessel ischemia to the brain stem and cerebral white matter. 3.  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   Original Report Authenticated By: Erskine Speed, M.D.    Mr Lumbar Spine Wo Contrast  03/21/2012  *RADIOLOGY REPORT*  Clinical Data: Low back pain and right leg pain.  Abnormal lumbar radiographs.  MRI LUMBAR SPINE WITHOUT CONTRAST  Technique:  Multiplanar and multiecho pulse sequences of the lumbar spine were obtained without intravenous contrast.  Comparison: Radiographs dated 03/20/2012 and a CT scan dated 12/26/2003  Findings: Tip of the conus is at L1 and appears normal.  Paraspinal soft tissues demonstrate a single tiny stone in the gallbladder wall and bilateral renal atrophy.  The patient has an aortobifemoral graft.   T10-11 through L1-2:  Normal.  L2-3:  There is a broad-based soft disc protrusion severely compressing the thecal sac trapping the nerve roots.  Hypertrophy of the ligamenta flava contributes to the severe spinal stenosis. The disc protrusion extends  into both neural foramina, right more than left.  The right L2 nerve is compressed in the neural foramen.  There is sclerosis and edema in the endplates of L2 and L3 with erosions of the endplates as well.  There is a tiny amount  of fluid in the disc space.  There is no paraspinal phlegmon or abscess. There is no epidural abscess.  L3-4:  Normal.  L4-5:  12 mm spondylolisthesis due to bilateral pars defects. Marked disc space narrowing.  Severe right foraminal stenosis.  The right L4 nerve is markedly compressed in the lateral aspect of the neural foramen.  The left L4 nerve appears to exit without severe compression.  No spinal stenosis.  No disc protrusion.  L5-S1:  Tiny bulge of the disc into the left lateral recess. Hypertrophy of the facet joints and ligamenta flava narrows the left lateral recess with osteophyte immediately adjacent to the left S1 nerve root sleeve without neural impingement.  IMPRESSION:  1.  Severe spinal stenosis at L2-3 with broad-based soft disc protrusion extending into the right neural foramen compressing the right L2 nerve in the neural foramen. 2.  Abnormal appearance of the disc space and endplates at L2-3.  I suspect this is chronic degenerative disc disease but there is a tiny amount of fluid in the disc space as well as a subtle edema in the vertebral bodies which could represent a low grade infection. However, there is no paraspinal soft tissue inflammation.  The patient does not have an elevated white blood count.  If there is clinical concern for infection , tissue sampling of the disc space could be performed. 3.  Severe right foraminal stenosis at L4-5 compressing the right L4 nerve.  This is felt to be chronic.   Original Report Authenticated By: Francene Boyers, M.D.     Microbiology: Recent Results (from the past 240 hour(s))  CULTURE, BLOOD (ROUTINE X 2)     Status: Normal   Collection Time   03/21/12  3:18 AM      Component Value Range Status Comment   Specimen  Description BLOOD LEFT HAND   Final    Special Requests BOTTLES DRAWN AEROBIC ONLY 10CC   Final    Culture  Setup Time 03/21/2012 09:28   Final    Culture NO GROWTH 5 DAYS   Final    Report Status 03/27/2012 FINAL   Final   CULTURE, BLOOD (ROUTINE X 2)     Status: Normal   Collection Time   03/21/12  3:20 AM      Component Value Range Status Comment   Specimen Description BLOOD RIGHT ARM   Final    Special Requests BOTTLES DRAWN AEROBIC AND ANAEROBIC 10CC EA   Final    Culture  Setup Time 03/21/2012 09:28   Final    Culture NO GROWTH 5 DAYS   Final    Report Status 03/27/2012 FINAL   Final   MRSA PCR SCREENING     Status: Normal   Collection Time   03/21/12 10:47 PM      Component Value Range Status Comment   MRSA by PCR NEGATIVE  NEGATIVE Final   WOUND CULTURE     Status: Normal   Collection Time   03/22/12  3:01 PM      Component Value Range Status Comment   Specimen Description WOUND ASPIRATE BACK   Final    Special Requests NONE   Final    Gram Stain     Final    Value: RARE WBC PRESENT, PREDOMINANTLY PMN     NO SQUAMOUS EPITHELIAL CELLS SEEN     NO ORGANISMS SEEN   Culture NO GROWTH 2 DAYS   Final    Report Status 03/24/2012 FINAL   Final  Labs: Basic Metabolic Panel:  Lab 03/25/12 1610 03/23/12 0713 03/22/12 0049 03/21/12 1645  NA 136 138 136 135  K 3.8 4.2 3.9 4.3  CL 94* 97 95* 92*  CO2 26 27 26 24   GLUCOSE 163* 101* 97 121*  BUN 29* 34* 21 67*  CREATININE 10.66* 11.30* 8.10* 18.13*  CALCIUM 8.9 9.0 8.3* 8.5  MG -- -- -- --  PHOS 4.3 4.3 -- 4.9*   Liver Function Tests:  Lab 03/25/12 0850 03/23/12 0713 03/21/12 1645  AST -- -- --  ALT -- -- --  ALKPHOS -- -- --  BILITOT -- -- --  PROT -- -- --  ALBUMIN 2.6* 2.7* 2.9*   No results found for this basename: LIPASE:5,AMYLASE:5 in the last 168 hours No results found for this basename: AMMONIA:5 in the last 168 hours CBC:  Lab 03/28/12 0845 03/28/12 0753 03/27/12 0757 03/25/12 0855 03/23/12 0713  WBC  6.8 7.5 7.7 7.0 7.6  NEUTROABS -- -- -- -- --  HGB 9.1* 8.4* 9.1* 7.1* 7.4*  HCT 26.4* 25.3* 26.7* 20.8* 22.7*  MCV 97.8 98.1 97.8 99.0 97.4  PLT 307 259 226 188 211   Cardiac Enzymes: No results found for this basename: CKTOTAL:5,CKMB:5,CKMBINDEX:5,TROPONINI:5 in the last 168 hours BNP: BNP (last 3 results) No results found for this basename: PROBNP:3 in the last 8760 hours CBG:  Lab 03/25/12 1631  GLUCAP 104*       Signed:  Sabino Denning  [facility] 03/28/2012, 9:55 AM

## 2012-06-16 ENCOUNTER — Other Ambulatory Visit (HOSPITAL_COMMUNITY): Payer: Self-pay | Admitting: Nephrology

## 2012-06-16 DIAGNOSIS — N186 End stage renal disease: Secondary | ICD-10-CM

## 2012-06-23 ENCOUNTER — Inpatient Hospital Stay (HOSPITAL_COMMUNITY)
Admission: RE | Admit: 2012-06-23 | Discharge: 2012-06-23 | Disposition: A | Discharge status: Home / Self Care | Payer: No Typology Code available for payment source | Source: Ambulatory Visit | Attending: Nephrology | Admitting: Nephrology

## 2012-07-05 ENCOUNTER — Ambulatory Visit (HOSPITAL_COMMUNITY): Admission: RE | Admit: 2012-07-05 | Payer: No Typology Code available for payment source | Source: Ambulatory Visit

## 2012-07-12 ENCOUNTER — Ambulatory Visit (HOSPITAL_COMMUNITY)
Admission: RE | Admit: 2012-07-12 | Discharge: 2012-07-12 | Disposition: A | Discharge status: Home / Self Care | Payer: No Typology Code available for payment source | Source: Ambulatory Visit | Attending: Nephrology | Admitting: Nephrology

## 2012-07-12 ENCOUNTER — Other Ambulatory Visit (HOSPITAL_COMMUNITY): Payer: Self-pay | Admitting: Nephrology

## 2012-07-12 DIAGNOSIS — E039 Hypothyroidism, unspecified: Secondary | ICD-10-CM | POA: Insufficient documentation

## 2012-07-12 DIAGNOSIS — M1990 Unspecified osteoarthritis, unspecified site: Secondary | ICD-10-CM | POA: Insufficient documentation

## 2012-07-12 DIAGNOSIS — D509 Iron deficiency anemia, unspecified: Secondary | ICD-10-CM | POA: Insufficient documentation

## 2012-07-12 DIAGNOSIS — N186 End stage renal disease: Secondary | ICD-10-CM | POA: Insufficient documentation

## 2012-07-12 DIAGNOSIS — I739 Peripheral vascular disease, unspecified: Secondary | ICD-10-CM | POA: Insufficient documentation

## 2012-07-12 DIAGNOSIS — I871 Compression of vein: Secondary | ICD-10-CM | POA: Insufficient documentation

## 2012-07-12 DIAGNOSIS — I120 Hypertensive chronic kidney disease with stage 5 chronic kidney disease or end stage renal disease: Secondary | ICD-10-CM | POA: Insufficient documentation

## 2012-07-12 DIAGNOSIS — F341 Dysthymic disorder: Secondary | ICD-10-CM | POA: Insufficient documentation

## 2012-07-12 DIAGNOSIS — Z87891 Personal history of nicotine dependence: Secondary | ICD-10-CM | POA: Insufficient documentation

## 2012-07-12 DIAGNOSIS — T82898A Other specified complication of vascular prosthetic devices, implants and grafts, initial encounter: Secondary | ICD-10-CM | POA: Insufficient documentation

## 2012-07-12 DIAGNOSIS — Z86711 Personal history of pulmonary embolism: Secondary | ICD-10-CM | POA: Insufficient documentation

## 2012-07-12 DIAGNOSIS — I714 Abdominal aortic aneurysm, without rupture, unspecified: Secondary | ICD-10-CM | POA: Insufficient documentation

## 2012-07-12 DIAGNOSIS — Y832 Surgical operation with anastomosis, bypass or graft as the cause of abnormal reaction of the patient, or of later complication, without mention of misadventure at the time of the procedure: Secondary | ICD-10-CM | POA: Insufficient documentation

## 2012-07-12 MED ORDER — IOHEXOL 300 MG/ML  SOLN
100.0000 mL | Freq: Once | INTRAMUSCULAR | Status: AC | PRN
  Administered 2012-07-12: 40 mL via INTRAVENOUS

## 2012-07-12 NOTE — H&P (Signed)
Nicole [name] is an 79 y.o. female.   Chief Complaint: Rt arm dialysis graft stenosis per shuntogram Scheduled now for angioplasty/stent placement HPI: HTN; ESRD; IDA; AAA; hx PE  Past Medical History  Diagnosis Date  . Hypertension   . ESRD (end stage renal disease)   . Peripheral vascular disease     revascularization appx. 14 years ago  . Hypothyroidism   . Hiatal hernia   . Iron deficiency anemia   . Osteoarthritis   . Depression with anxiety   . Pulmonary embolism   . AAA (abdominal aortic aneurysm) 1999    aortic byfem bypass    Past Surgical History  Procedure Laterality Date  . Abdominal surgery  about 14 years ago    REVASCULARIZATION  . Ateriovenous graft  04/18/2009    LEFT FOREARM avg  . Av fistula placement, radiocephalic  01/09/10  . Avgg removal  12/02/09    SECONDARY TO INFECTION LT FOREARM  . Av fistula placement, radiocephalic  05/08/10    NONMATURED  . Arteriovenous graft placement  07/24/10    RIGHT FOREARM LOOP  . Dg av dialysis graft declot or  12/05/10, 6/17/123, 09/24/11, 01/26/12    CLOTTED GRAFT  . Angioplasty  12/21/11    of RUE AVG    No family history on file. Social History:  reports that she has quit smoking. She does not have any smokeless tobacco history on file. She reports that she does not drink alcohol or use illicit drugs.  Allergies: No Known Allergies   (Not in a hospital admission)  No results found for this or any previous visit (from the past 48 hour(s)). No results found.  Review of Systems  Constitutional: Negative for fever.  Respiratory: Negative for shortness of breath.   Gastrointestinal: Negative for nausea and vomiting.  Neurological: Positive for weakness.    There were no vitals taken for this visit. Physical Exam  Constitutional: She is oriented to person, place, and time.  Cardiovascular: Normal rate, regular rhythm and normal heart sounds.   No murmur heard. Respiratory: Effort normal and breath sounds  normal.  Neurological: She is alert and oriented to person, place, and time.  Psychiatric: She has a normal mood and affect. Her behavior is normal. Judgment and thought content normal.     Assessment/Plan Rt arm dialysis graft stenosis Pt scheduled for pta/stent Pt aware of procedure benefits and risks and agreeable to proceed Consent signed and in chart  Keshia Weare A 07/12/2012, 2:48 PM

## 2012-07-12 NOTE — Procedures (Signed)
Successful RUE AVG PTA NO COMP STABLE FULL REPORT IN PACS

## 2012-09-05 ENCOUNTER — Encounter (HOSPITAL_COMMUNITY): Payer: Self-pay | Admitting: *Deleted

## 2012-09-05 ENCOUNTER — Inpatient Hospital Stay (HOSPITAL_COMMUNITY)
Admission: EM | Admit: 2012-09-05 | Discharge: 2012-09-15 | DRG: 371 | Disposition: A | Discharge status: Skilled Nursing Facility | Payer: No Typology Code available for payment source | Attending: Internal Medicine | Admitting: Internal Medicine

## 2012-09-05 ENCOUNTER — Emergency Department (HOSPITAL_COMMUNITY): Payer: No Typology Code available for payment source

## 2012-09-05 DIAGNOSIS — R9431 Abnormal electrocardiogram [ECG] [EKG]: Secondary | ICD-10-CM | POA: Diagnosis present

## 2012-09-05 DIAGNOSIS — M549 Dorsalgia, unspecified: Secondary | ICD-10-CM

## 2012-09-05 DIAGNOSIS — M1990 Unspecified osteoarthritis, unspecified site: Secondary | ICD-10-CM | POA: Diagnosis present

## 2012-09-05 DIAGNOSIS — M4640 Discitis, unspecified, site unspecified: Secondary | ICD-10-CM

## 2012-09-05 DIAGNOSIS — Z113 Encounter for screening for infections with a predominantly sexual mode of transmission: Secondary | ICD-10-CM

## 2012-09-05 DIAGNOSIS — M869 Osteomyelitis, unspecified: Secondary | ICD-10-CM | POA: Diagnosis present

## 2012-09-05 DIAGNOSIS — N186 End stage renal disease: Secondary | ICD-10-CM | POA: Diagnosis present

## 2012-09-05 DIAGNOSIS — D631 Anemia in chronic kidney disease: Secondary | ICD-10-CM | POA: Diagnosis present

## 2012-09-05 DIAGNOSIS — K6812 Psoas muscle abscess: Secondary | ICD-10-CM | POA: Diagnosis present

## 2012-09-05 DIAGNOSIS — M519 Unspecified thoracic, thoracolumbar and lumbosacral intervertebral disc disorder: Secondary | ICD-10-CM | POA: Diagnosis present

## 2012-09-05 DIAGNOSIS — R7881 Bacteremia: Secondary | ICD-10-CM

## 2012-09-05 DIAGNOSIS — IMO0002 Reserved for concepts with insufficient information to code with codable children: Secondary | ICD-10-CM

## 2012-09-05 DIAGNOSIS — Z79899 Other long term (current) drug therapy: Secondary | ICD-10-CM

## 2012-09-05 DIAGNOSIS — D509 Iron deficiency anemia, unspecified: Secondary | ICD-10-CM | POA: Diagnosis present

## 2012-09-05 DIAGNOSIS — Z86711 Personal history of pulmonary embolism: Secondary | ICD-10-CM

## 2012-09-05 DIAGNOSIS — I10 Essential (primary) hypertension: Secondary | ICD-10-CM | POA: Diagnosis present

## 2012-09-05 DIAGNOSIS — I120 Hypertensive chronic kidney disease with stage 5 chronic kidney disease or end stage renal disease: Secondary | ICD-10-CM | POA: Diagnosis present

## 2012-09-05 DIAGNOSIS — N2581 Secondary hyperparathyroidism of renal origin: Secondary | ICD-10-CM | POA: Diagnosis present

## 2012-09-05 DIAGNOSIS — I739 Peripheral vascular disease, unspecified: Secondary | ICD-10-CM | POA: Diagnosis present

## 2012-09-05 DIAGNOSIS — F29 Unspecified psychosis not due to a substance or known physiological condition: Secondary | ICD-10-CM | POA: Diagnosis not present

## 2012-09-05 DIAGNOSIS — M4646 Discitis, unspecified, lumbar region: Secondary | ICD-10-CM | POA: Diagnosis present

## 2012-09-05 DIAGNOSIS — E46 Unspecified protein-calorie malnutrition: Secondary | ICD-10-CM | POA: Diagnosis present

## 2012-09-05 DIAGNOSIS — M4626 Osteomyelitis of vertebra, lumbar region: Secondary | ICD-10-CM | POA: Diagnosis present

## 2012-09-05 DIAGNOSIS — E039 Hypothyroidism, unspecified: Secondary | ICD-10-CM | POA: Diagnosis present

## 2012-09-05 DIAGNOSIS — Z87891 Personal history of nicotine dependence: Secondary | ICD-10-CM

## 2012-09-05 DIAGNOSIS — Z992 Dependence on renal dialysis: Secondary | ICD-10-CM | POA: Diagnosis present

## 2012-09-05 DIAGNOSIS — N039 Chronic nephritic syndrome with unspecified morphologic changes: Secondary | ICD-10-CM

## 2012-09-05 DIAGNOSIS — I498 Other specified cardiac arrhythmias: Secondary | ICD-10-CM | POA: Diagnosis present

## 2012-09-05 LAB — BASIC METABOLIC PANEL
CO2: 25 mEq/L (ref 19–32)
Chloride: 91 mEq/L — ABNORMAL LOW (ref 96–112)
GFR calc Af Amer: 3 mL/min — ABNORMAL LOW (ref >90)
Potassium: 4.2 mEq/L (ref 3.5–5.1)

## 2012-09-05 LAB — SEDIMENTATION RATE: Sed Rate: 54 mm/hr — ABNORMAL HIGH (ref 0–22)

## 2012-09-05 LAB — CBC WITH DIFFERENTIAL/PLATELET
Basophils Absolute: 0 10*3/uL (ref 0.0–0.1)
Basophils Relative: 0 % (ref 0–1)
HCT: 37.2 % (ref 36.0–46.0)
Hemoglobin: 12.4 g/dL (ref 12.0–15.0)
Lymphocytes Relative: 32 % (ref 12–46)
MCHC: 33.3 g/dL (ref 30.0–36.0)
Monocytes Absolute: 0.6 10*3/uL (ref 0.1–1.0)
Neutro Abs: 3.2 10*3/uL (ref 1.7–7.7)
Neutrophils Relative %: 54 % (ref 43–77)
RDW: 15.3 % (ref 11.5–15.5)
WBC: 5.9 10*3/uL (ref 4.0–10.5)

## 2012-09-05 LAB — MRSA PCR SCREENING: MRSA by PCR: NEGATIVE

## 2012-09-05 LAB — TSH: TSH: 114.71 u[IU]/mL — ABNORMAL HIGH (ref 0.350–4.500)

## 2012-09-05 MED ORDER — HYDRALAZINE HCL 20 MG/ML IJ SOLN
10.0000 mg | Freq: Once | INTRAMUSCULAR | Status: AC
  Administered 2012-09-05: 10 mg via INTRAVENOUS
  Filled 2012-09-05: qty 1

## 2012-09-05 MED ORDER — SODIUM CHLORIDE 0.9 % IV SOLN: 100.0000 mL | INTRAVENOUS | Status: DC | PRN

## 2012-09-05 MED ORDER — HEPARIN SODIUM (PORCINE) 5000 UNIT/ML IJ SOLN
5000.0000 [IU] | Freq: Three times a day (TID) | INTRAMUSCULAR | Status: DC
  Administered 2012-09-05: 5000 [IU] via SUBCUTANEOUS
  Filled 2012-09-05 (×5): qty 1

## 2012-09-05 MED ORDER — DOXERCALCIFEROL 4 MCG/2ML IV SOLN
1.0000 ug | INTRAVENOUS | Status: DC
  Administered 2012-09-12: 1 ug via INTRAVENOUS
  Filled 2012-09-05 (×3): qty 2

## 2012-09-05 MED ORDER — LIDOCAINE HCL (PF) 1 % IJ SOLN: 5.0000 mL | INTRAMUSCULAR | Status: DC | PRN

## 2012-09-05 MED ORDER — LEVOTHYROXINE SODIUM 150 MCG PO TABS
150.0000 ug | ORAL_TABLET | Freq: Every day | ORAL | Status: DC
  Administered 2012-09-06 – 2012-09-15 (×9): 150 ug via ORAL
  Filled 2012-09-05 (×11): qty 1

## 2012-09-05 MED ORDER — LISINOPRIL 40 MG PO TABS
40.0000 mg | ORAL_TABLET | Freq: Every day | ORAL | Status: DC
  Administered 2012-09-06 – 2012-09-09 (×4): 40 mg via ORAL
  Filled 2012-09-05 (×6): qty 1

## 2012-09-05 MED ORDER — HYDROCODONE-ACETAMINOPHEN 5-325 MG PO TABS
1.0000 | ORAL_TABLET | Freq: Once | ORAL | Status: AC
  Administered 2012-09-05: 1 via ORAL
  Filled 2012-09-05: qty 1

## 2012-09-05 MED ORDER — NEPRO/CARBSTEADY PO LIQD
237.0000 mL | Freq: Two times a day (BID) | ORAL | Status: DC
  Administered 2012-09-06 – 2012-09-15 (×9): 237 mL via ORAL

## 2012-09-05 MED ORDER — CLONIDINE HCL 0.1 MG PO TABS
0.1000 mg | ORAL_TABLET | Freq: Once | ORAL | Status: AC
  Administered 2012-09-05: 0.1 mg via ORAL
  Filled 2012-09-05: qty 1

## 2012-09-05 MED ORDER — CALCIUM ACETATE 667 MG PO CAPS
1334.0000 mg | ORAL_CAPSULE | Freq: Three times a day (TID) | ORAL | Status: DC
  Administered 2012-09-06 – 2012-09-15 (×15): 1334 mg via ORAL
  Filled 2012-09-05 (×31): qty 2

## 2012-09-05 MED ORDER — NEPRO/CARBSTEADY PO LIQD: 237.0000 mL | ORAL | Status: DC | PRN

## 2012-09-05 MED ORDER — HEPARIN SODIUM (PORCINE) 1000 UNIT/ML DIALYSIS: 20.0000 [IU]/kg | INTRAMUSCULAR | Status: DC | PRN

## 2012-09-05 MED ORDER — HYDROMORPHONE HCL PF 1 MG/ML IJ SOLN
0.5000 mg | INTRAMUSCULAR | Status: DC | PRN
  Administered 2012-09-05 (×2): 0.5 mg via INTRAVENOUS
  Filled 2012-09-05 (×2): qty 1

## 2012-09-05 MED ORDER — PENTAFLUOROPROP-TETRAFLUOROETH EX AERO: 1.0000 "application " | INHALATION_SPRAY | CUTANEOUS | Status: DC | PRN

## 2012-09-05 MED ORDER — SODIUM CHLORIDE 0.9 % IV SOLN
INTRAVENOUS | Status: AC
  Administered 2012-09-05: 13:00:00 via INTRAVENOUS

## 2012-09-05 MED ORDER — RENA-VITE PO TABS
1.0000 | ORAL_TABLET | Freq: Every day | ORAL | Status: DC
  Administered 2012-09-05 – 2012-09-14 (×10): 1 via ORAL
  Filled 2012-09-05 (×13): qty 1

## 2012-09-05 MED ORDER — SODIUM CHLORIDE 0.9 % IV SOLN: Freq: Once | INTRAVENOUS | Status: DC

## 2012-09-05 MED ORDER — LIDOCAINE-PRILOCAINE 2.5-2.5 % EX CREA: 1.0000 "application " | TOPICAL_CREAM | CUTANEOUS | Status: DC | PRN

## 2012-09-05 MED ORDER — HYDROMORPHONE HCL PF 1 MG/ML IJ SOLN
0.5000 mg | INTRAMUSCULAR | Status: DC | PRN
  Administered 2012-09-05: 0.5 mg via INTRAVENOUS
  Filled 2012-09-05: qty 1

## 2012-09-05 MED ORDER — SODIUM CHLORIDE 0.9 % IJ SOLN
3.0000 mL | Freq: Two times a day (BID) | INTRAMUSCULAR | Status: DC
  Administered 2012-09-05 – 2012-09-15 (×13): 3 mL via INTRAVENOUS

## 2012-09-05 MED ORDER — AMLODIPINE BESYLATE 10 MG PO TABS
10.0000 mg | ORAL_TABLET | Freq: Once | ORAL | Status: AC
  Administered 2012-09-05: 10 mg via ORAL
  Filled 2012-09-05: qty 1

## 2012-09-05 MED ORDER — ALTEPLASE 2 MG IJ SOLR
2.0000 mg | Freq: Once | INTRAMUSCULAR | Status: DC | PRN
  Filled 2012-09-05: qty 2

## 2012-09-05 MED ORDER — AMLODIPINE BESYLATE 10 MG PO TABS
10.0000 mg | ORAL_TABLET | Freq: Every day | ORAL | Status: DC
  Administered 2012-09-05 – 2012-09-09 (×5): 10 mg via ORAL
  Filled 2012-09-05 (×6): qty 1

## 2012-09-05 MED ORDER — HEPARIN SODIUM (PORCINE) 1000 UNIT/ML DIALYSIS: 1000.0000 [IU] | INTRAMUSCULAR | Status: DC | PRN

## 2012-09-05 MED ORDER — VANCOMYCIN HCL IN DEXTROSE 1-5 GM/200ML-% IV SOLN: 1000.0000 mg | Freq: Once | INTRAVENOUS | Status: DC

## 2012-09-05 NOTE — H&P (Signed)
Date: 09/05/2012               Patient Name:  Nicole [name] MRN: [id number]  DOB: [birth year] Age / Sex: 79 y.o., female   PCP: [name], MD (TTS HD, [facility])         Medical Service: Internal Medicine Teaching Service         Attending Physician: Dr. Kem Kays    First Contact: Dr. Mikey Bussing Pager: [phone]  Second Contact: Dr. Everardo Beals Pager: [phone]       After Hours (After 5p/  First Contact Pager: [phone]  weekends / holidays): Second Contact Pager: [phone]   Chief Complaint: Back Pain  History of Present Illness:  Nicole [name] is a 79 year old female with a PMH significant for ESRD, PVD, Hypothyroidism, Iron deficiency anemia, depression, and AAA.  She presented to [facility] ED for lower back pain for the past 3 days.  She reports the pain is sharp, constant, worsening, 10/10, radiates down the front of right leg.  The pain in back/legs feels like never before (worse than in January); she did not try any medications to help with pain. Denies fever/chills.  Hasn't been able to sit to have a bowel movement for the last 3 days.  Tried some back pills without relief (Done's back pills).   She does feel  better after dilaudid 0.5mg  given in the ED.  Pain is worse with movement, it hurts to walk or sit up in bed. She lives with son who helps with food.  Review of Systems: Constitutional: Denies fever, chills, diaphoresis, and fatigue. Decreased appetite HEENT: Denies photophobia, eye pain, redness, hearing loss, ear pain, congestion, sore throat, rhinorrhea, sneezing, mouth sores, trouble swallowing, neck pain, neck stiffness and tinnitus.  Respiratory: Denies SOB, DOE, cough, chest tightness, and wheezing.  Cardiovascular: Denies chest pain, palpitations and leg swelling.  Gastrointestinal: Denies nausea, vomiting, abdominal pain, diarrhea, blood in stool and abdominal distention.  Genitourinary: Denies dysuria, urgency, frequency, hematuria, flank pain and difficulty urinating.    Musculoskeletal: Denies other myalgias, joint swelling, arthralgias Skin: Denies pallor, rash and wound.  Neurological: Denies dizziness, seizures, syncope, weakness, lightheadedness, numbness and headaches.   Meds: Medication Sig  . amLODipine (NORVASC) 10 MG tablet Take 10 mg by mouth at bedtime.  Marland Kitchen dextrose 5 % SOLN 50 mL with cefTAZidime 2 G SOLR 2 g Inject 2 g into the vein 3 (three) times a week.  Marland Kitchen HYDROcodone-acetaminophen (NORCO/VICODIN) 5-325 MG per tablet Take 1 tablet by mouth every 6 (six) hours as needed for pain.  Marland Kitchen levothyroxine (SYNTHROID, LEVOTHROID) 150 MCG tablet Take 1 tablet (150 mcg total) by mouth daily before breakfast.  . lisinopril (PRINIVIL,ZESTRIL) 40 MG tablet Take 40 mg by mouth at bedtime.  . multivitamin (RENA-VIT) TABS tablet Take 1 tablet by mouth at bedtime.  . feeding supplement (RESOURCE BREEZE) LIQD Take 1 Container by mouth 3 (three) times daily between meals.    Allergies: Allergies as of 09/05/2012  . (No Known Allergies)   Past Medical History  Diagnosis Date  . Hypertension   . ESRD (end stage renal disease)   . Peripheral vascular disease     revascularization appx. 14 years ago  . Hypothyroidism   . Hiatal hernia   . Iron deficiency anemia   . Osteoarthritis   . Depression with anxiety   . Pulmonary embolism   . AAA (abdominal aortic aneurysm) 1999    aortic byfem bypass   Past Surgical History  Procedure  Laterality Date  . Abdominal surgery  about 14 years ago    REVASCULARIZATION  . Ateriovenous graft  04/18/2009    LEFT FOREARM avg  . Av fistula placement, radiocephalic  01/09/10  . Avgg removal  12/02/09    SECONDARY TO INFECTION LT FOREARM  . Av fistula placement, radiocephalic  05/08/10    NONMATURED  . Arteriovenous graft placement  07/24/10    RIGHT FOREARM LOOP  . Dg av dialysis graft declot or  12/05/10, 6/17/123, 09/24/11, 01/26/12    CLOTTED GRAFT  . Angioplasty  12/21/11    of RUE AVG   History reviewed. No  pertinent family history. History   Social History  . Marital Status: Widowed    Spouse Name: N/A    Number of Children: N/A  . Years of Education: N/A   Occupational History  . Not on file.   Social History Main Topics  . Smoking status: Former Games developer  . Smokeless tobacco: Not on file  . Alcohol Use: No  . Drug Use: No  . Sexually Active: Not on file   Other Topics Concern  . Not on file   Social History Narrative  . No narrative on file    Physical Exam: Blood pressure 169/85, pulse 72, temperature 98 F (36.7 C), temperature source Oral, resp. rate 20, SpO2 96.00%. General: resting in bed HEENT: PERRL, EOMI Cardiac: Bradycardic, no rubs, murmurs or gallops Pulm: clear to auscultation bilaterally, moving normal volumes of air Abd: soft, nontender, nondistended, BS normalactive Ext: warm and well perfused, no pedal edema. Right internal and external rotation mildly limited secondary to pain. Back: TTP R>L lumbar spine and paraspinal muscles. Neuro: alert and oriented X3, cranial nerves II-XII grossly intact Dialysis Access: right lower AVGG + bruit  Lab results: Basic Metabolic Panel:  Recent Labs  40/98/11 0833  NA 135  K 4.2  CL 91*  CO2 25  GLUCOSE 86  BUN 50*  CREATININE 11.77*  CALCIUM 8.9   CBC:  Recent Labs  09/05/12 0833  WBC 5.9  NEUTROABS 3.2  HGB 12.4  HCT 37.2  MCV 102.2*  PLT 352   Imaging results:  Dg Lumbar Spine Complete  09/05/2012   *RADIOLOGY REPORT*  Clinical Data: Low back pain and right leg pain  LUMBAR SPINE - COMPLETE 4+ VIEW  Comparison: 03/20/2012  Findings: Five lumbar type vertebral bodies are well visualized. There are chronic changes noted at the L2-3 interspace with destruction of the endplates and sclerosis.  The overall appearance is stable.  Chronic anterolisthesis of L4 on L5 is seen.  No acute compression deformity is noted.  IMPRESSION: Chronic changes without acute abnormality.   Original Report Authenticated  By: Alcide Clever, M.D.   Mr Lumbar Spine Wo Contrast  09/05/2012   *RADIOLOGY REPORT*  Clinical Data: Low back pain with right leg pain.  History of diskitis.  Dialysis patient. Intravenous contrast not administered due to dialysis.  MRI LUMBAR SPINE WITHOUT CONTRAST  Technique:  Multiplanar and multiecho pulse sequences of the lumbar spine were obtained without intravenous contrast.  Comparison: Lumbar MRI 03/21/2012  Findings: Progressive changes at L2-3 compared with the prior study.  There is endplate destruction with increased fluid in the disc space and increase in bone marrow in the edema at L2 and L3. The findings are suggestive of recurrent diskitis.  In addition, there are fluid collections in the psoas muscle bilaterally which were not present previously consistent with abscess.  This measures 23 mm in the  right psoas muscle and 12 mm in the left psoas muscle. Conus medullaris is normal and terminates at L1-2.  L1-2:  Negative  L2-3:  Progressive changes of diskitis and osteomyelitis compared with the prior MRI.  See above comments.  There is extensive osteophyte formation causing severe spinal stenosis which is similar to the prior study.  There is facet and ligamentum flavum hypertrophy and foraminal encroachment bilaterally with impingement of the L2 nerve root bilaterally.  L3-4:  Negative  L4-5:  12 mm anterior slip is unchanged.  There is severe disc degeneration.  There is impingement of the L4 nerve root bilaterally due to marked foraminal encroachment which is unchanged.  There is mild to spinal stenosis.  L5-S1:  Disc degeneration and spondylosis is unchanged.  Mild foraminal narrowing bilaterally.  IMPRESSION: Progressive changes at L2-3 consistent with recurrent diskitis and osteomyelitis. Interval development of bilateral psoas abscess. There is severe spinal stenosis at L2-3 related to spondylosis and facet hypertrophy.  This is unchanged.  No epidural abscess is identified.  12 mm anterior  slip L4-5 with marked foraminal encroachment bilaterally.  This is unchanged from the prior MRI.   Original Report Authenticated By: Janeece Riggers, M.D.    Other results: EKG: no EKG at admission  Assessment & Plan by Problem: Principal Problem:   Osteomyelitis/Discitis of lumbar spine -MRI revealed progressive changes consistent with recurrent diskitis and osteomyelitis with b/l psoas abscess.  No epidural abscess seen on MRI.  She was diagnosed with Lumbar discitis and osteomyelitis in January 2014 but aspirated culture showed no growth and she was treated with 6 weeks of Vanc and [place]. -Patient with ESRD on dialysis, port is likely source of bacteremia to cause osteomyelitis -Follow Blood cultures, only 1 taken pt refused second, will get second set when patient goes for HD, and monitor for fevers -L2-L3 disc space aspiration on 7/2 -Hold Abx until CT Aspiration then start Vanc and Cefepime, if patient become hemodynamically unstable will start sooner.  Active Problems:   Accelerated Hypertension  - BP in 230s/70s, bradycardic -Given clonidine 0.1 and hydralazine 10mg  once in ER -Will resume lisinopril 40mg  and amlodipine 10mg . -Dialysis will remove excess volume. -Continue to monitor.    ESRD (end stage renal disease) on dialysis -Normal Dialysis schedule is T, TH, S. (She follows with Dr. Geanie Berlin, HD at [facility]) Will send to dialysis today. -She does make a small amount of urine normally.   Bradycardia -EKG ordered PVD (peripheral vascular disease) -No ulcers noted.   Hypothyroidism -Continue home synthroid dose of , for now will not recheck TSH since patient compliance is unknown.  Will recommend TSH as outpatient and possibly home health to increase compliance with home medications.   Anemia in chronic kidney disease(285.21) -Patient with ESRD, normally hemoglobin runs 8-9 currently hemoglobin is 12.4 before HD.  Will continue to monitor. Severe spinal stenosis at  L2-3 -Unchanged compared to previous MRI.   #Code Status: full code Dispo: Disposition is deferred at this time, awaiting improvement of current medical problems. Anticipated discharge in approximately 2-3 day(s).   The patient does have a current PCP [name], MD) and does not need an [facility] hospital follow-up appointment after discharge.  The patient does not have transportation limitations that hinder transportation to clinic appointments.  Signed: Carlynn Purl, DO 09/05/2012, 8:36 PM

## 2012-09-05 NOTE — ED Notes (Signed)
PT is here with right buttock and right leg pain for the last couple of days. No injury.  Pulse present to LE.  Pt states pain with movement

## 2012-09-05 NOTE — ED Notes (Signed)
Pt heart rate has bounced around from the 40s-50s SB with PVCs.  EKG done and given to Dr. Ethelda Chick for HR and history of AAA.  Pt denies chest or abdominal pain.  PT reports increased lower back pain.

## 2012-09-05 NOTE — ED Notes (Signed)
Report given to dialysis unit and to the floor.

## 2012-09-05 NOTE — ED Provider Notes (Signed)
History    CSN: [id number] Arrival date & time 09/05/12  4098  First MD Initiated Contact with Patient 09/05/12 [phone]     Chief Complaint  Patient presents with  . Back Pain  . Leg Pain   (Consider location/radiation/quality/duration/timing/severity/associated sxs/prior Treatment) Patient is a 79 y.o. female presenting with back pain and leg pain. The history is provided by the patient. No language interpreter was used.  Back Pain Location:  Lumbar spine Associated symptoms: leg pain   Associated symptoms: no abdominal pain, no chest pain, no fever, no numbness and no weakness   Associated symptoms comment:  Lower back pain for the past 3 days radiating into right buttock and right leg without numbness or weakness. It is worse with movement, better with lying still. No abdominal pain, fever, N, or vomiting. She is a dialysis patient and goes for treatment on T, Th, S and reports she is compliant.  Leg Pain Associated symptoms: back pain   Associated symptoms: no fever    Past Medical History  Diagnosis Date  . Hypertension   . ESRD (end stage renal disease)   . Peripheral vascular disease     revascularization appx. 14 years ago  . Hypothyroidism   . Hiatal hernia   . Iron deficiency anemia   . Osteoarthritis   . Depression with anxiety   . Pulmonary embolism   . AAA (abdominal aortic aneurysm) 1999    aortic byfem bypass   Past Surgical History  Procedure Laterality Date  . Abdominal surgery  about 14 years ago    REVASCULARIZATION  . Ateriovenous graft  04/18/2009    LEFT FOREARM avg  . Av fistula placement, radiocephalic  01/09/10  . Avgg removal  12/02/09    SECONDARY TO INFECTION LT FOREARM  . Av fistula placement, radiocephalic  05/08/10    NONMATURED  . Arteriovenous graft placement  07/24/10    RIGHT FOREARM LOOP  . Dg av dialysis graft declot or  12/05/10, 6/17/123, 09/24/11, 01/26/12    CLOTTED GRAFT  . Angioplasty  12/21/11    of RUE AVG   No family history  on file. History  Substance Use Topics  . Smoking status: Former Games developer  . Smokeless tobacco: Not on file  . Alcohol Use: No   OB History   Grav Para Term Preterm Abortions TAB SAB Ect Mult Living                 Review of Systems  Constitutional: Negative for fever.  Respiratory: Negative for shortness of breath.   Cardiovascular: Negative for chest pain.  Gastrointestinal: Negative for nausea, vomiting and abdominal pain.  Musculoskeletal: Positive for back pain.  Neurological: Negative for weakness and numbness.    Allergies  Review of patient's allergies indicates no known allergies.  Home Medications   Current Outpatient Rx  Name  Route  Sig  Dispense  Refill  . amLODipine (NORVASC) 10 MG tablet   Oral   Take 10 mg by mouth at bedtime.         Marland Kitchen dextrose 5 % SOLN 50 mL with cefTAZidime 2 G SOLR 2 g   Intravenous   Inject 2 g into the vein 3 (three) times a week.         Marland Kitchen HYDROcodone-acetaminophen (NORCO/VICODIN) 5-325 MG per tablet   Oral   Take 1 tablet by mouth every 6 (six) hours as needed for pain.   30 tablet   0   . ibuprofen (ADVIL,MOTRIN)  400 MG tablet   Oral   Take 1 tablet (400 mg total) by mouth every 6 (six) hours as needed for pain.   30 tablet   0   . levothyroxine (SYNTHROID, LEVOTHROID) 150 MCG tablet   Oral   Take 1 tablet (150 mcg total) by mouth daily before breakfast.         . lisinopril (PRINIVIL,ZESTRIL) 40 MG tablet   Oral   Take 40 mg by mouth at bedtime.         . meloxicam (MOBIC) 15 MG tablet   Oral   Take 1 tablet (15 mg total) by mouth daily.   30 tablet   2   . multivitamin (RENA-VIT) TABS tablet   Oral   Take 1 tablet by mouth at bedtime.         . feeding supplement (RESOURCE BREEZE) LIQD   Oral   Take 1 Container by mouth 3 (three) times daily between meals.          BP 239/64  Pulse 46  Temp(Src) 98 F (36.7 C) (Oral)  Resp 16  SpO2 100% Physical Exam  Constitutional: She is oriented to  person, place, and time. She appears well-developed and well-nourished. No distress.  Cardiovascular: Intact distal pulses.   Pulmonary/Chest: Effort normal.  Abdominal: Soft. There is no tenderness.  Musculoskeletal:  Paralumbar tenderness and right buttock tenderness. No strength deficits in lower extremities.   Neurological: She is alert and oriented to person, place, and time.  Pain with straight leg raise and hip rotation.   Psychiatric: She has a normal mood and affect.    ED Course  Procedures (including critical care time) Labs Reviewed  CBC WITH DIFFERENTIAL  BASIC METABOLIC PANEL   Dg Lumbar Spine Complete  09/05/2012   *RADIOLOGY REPORT*  Clinical Data: Low back pain and right leg pain  LUMBAR SPINE - COMPLETE 4+ VIEW  Comparison: 03/20/2012  Findings: Five lumbar type vertebral bodies are well visualized. There are chronic changes noted at the L2-3 interspace with destruction of the endplates and sclerosis.  The overall appearance is stable.  Chronic anterolisthesis of L4 on L5 is seen.  No acute compression deformity is noted.  IMPRESSION: Chronic changes without acute abnormality.   Original Report Authenticated By: Alcide Clever, M.D.   Results for orders placed during the hospital encounter of 09/05/12  CBC WITH DIFFERENTIAL      Result Value Range   WBC 5.9  4.0 - 10.5 K/uL   RBC 3.64 (*) 3.87 - 5.11 MIL/uL   Hemoglobin 12.4  12.0 - 15.0 g/dL   HCT 16.1  09.6 - 04.5 %   MCV 102.2 (*) 78.0 - 100.0 fL   MCH 34.1 (*) 26.0 - 34.0 pg   MCHC 33.3  30.0 - 36.0 g/dL   RDW 40.9  81.1 - 91.4 %   Platelets 352  150 - 400 K/uL   Neutrophils Relative % 54  43 - 77 %   Neutro Abs 3.2  1.7 - 7.7 K/uL   Lymphocytes Relative 32  12 - 46 %   Lymphs Abs 1.9  0.7 - 4.0 K/uL   Monocytes Relative 11  3 - 12 %   Monocytes Absolute 0.6  0.1 - 1.0 K/uL   Eosinophils Relative 3  0 - 5 %   Eosinophils Absolute 0.2  0.0 - 0.7 K/uL   Basophils Relative 0  0 - 1 %   Basophils Absolute 0.0   0.0 - 0.1  K/uL  BASIC METABOLIC PANEL      Result Value Range   Sodium 135  135 - 145 mEq/L   Potassium 4.2  3.5 - 5.1 mEq/L   Chloride 91 (*) 96 - 112 mEq/L   CO2 25  19 - 32 mEq/L   Glucose, Bld 86  70 - 99 mg/dL   BUN 50 (*) 6 - 23 mg/dL   Creatinine, Ser 16.10 (*) 0.50 - 1.10 mg/dL   Calcium 8.9  8.4 - 96.0 mg/dL   GFR calc non Af Amer 3 (*) >90 mL/min   GFR calc Af Amer 3 (*) >90 mL/min   Mr Lumbar Spine Wo Contrast  09/05/2012   *RADIOLOGY REPORT*  Clinical Data: Low back pain with right leg pain.  History of diskitis.  Dialysis patient. Intravenous contrast not administered due to dialysis.  MRI LUMBAR SPINE WITHOUT CONTRAST  Technique:  Multiplanar and multiecho pulse sequences of the lumbar spine were obtained without intravenous contrast.  Comparison: Lumbar MRI 03/21/2012  Findings: Progressive changes at L2-3 compared with the prior study.  There is endplate destruction with increased fluid in the disc space and increase in bone marrow in the edema at L2 and L3. The findings are suggestive of recurrent diskitis.  In addition, there are fluid collections in the psoas muscle bilaterally which were not present previously consistent with abscess.  This measures 23 mm in the right psoas muscle and 12 mm in the left psoas muscle. Conus medullaris is normal and terminates at L1-2.  L1-2:  Negative  L2-3:  Progressive changes of diskitis and osteomyelitis compared with the prior MRI.  See above comments.  There is extensive osteophyte formation causing severe spinal stenosis which is similar to the prior study.  There is facet and ligamentum flavum hypertrophy and foraminal encroachment bilaterally with impingement of the L2 nerve root bilaterally.  L3-4:  Negative  L4-5:  12 mm anterior slip is unchanged.  There is severe disc degeneration.  There is impingement of the L4 nerve root bilaterally due to marked foraminal encroachment which is unchanged.  There is mild to spinal stenosis.  L5-S1:   Disc degeneration and spondylosis is unchanged.  Mild foraminal narrowing bilaterally.  IMPRESSION: Progressive changes at L2-3 consistent with recurrent diskitis and osteomyelitis. Interval development of bilateral psoas abscess. There is severe spinal stenosis at L2-3 related to spondylosis and facet hypertrophy.  This is unchanged.  No epidural abscess is identified.  12 mm anterior slip L4-5 with marked foraminal encroachment bilaterally.  This is unchanged from the prior MRI.   Original Report Authenticated By: Janeece Riggers, M.D.    No diagnosis found. 1. Diskitis, recurrent  MDM  Records reviewed and shows a history of lumbar discitis treated with admission and IV antibiotics. She reports pain is the same.   MRI reports similar findings as previous with diagnosis diskitis/osteomyelitis. OPC paged for admission. IV abx started, ID consult made.  Arnoldo Hooker, PA-C 09/05/12 1209  Arnoldo Hooker, PA-C 09/05/12 1213

## 2012-09-05 NOTE — Consult Note (Signed)
          [facility]    Date of Admission:  09/05/2012          Reason for Consult: Recurrent lumbar infection with bilateral psoas abscesses    Referring Physician: Dr. Thomos Lemons  Principal Problem:   Osteomyelitis of lumbar spine Active Problems:   Discitis of lumbar region   Psoas abscess   Hypertension   ESRD (end stage renal disease) on dialysis   PVD (peripheral vascular disease)   Hypothyroidism   Anemia in chronic kidney disease(285.21)   . sodium chloride   Intravenous STAT  . amLODipine  10 mg Oral QHS  . [START ON 09/06/2012] calcium acetate  1,334 mg Oral TID WC  . [START ON 09/07/2012] doxercalciferol  1 mcg Intravenous Q T,Th,Sa-HD  . [START ON 09/06/2012] feeding supplement (NEPRO CARB STEADY)  237 mL Oral BID BM  . heparin  5,000 Units Subcutaneous Q8H  . [START ON 09/06/2012] levothyroxine  150 mcg Oral QAC breakfast  . [START ON 09/06/2012] lisinopril  40 mg Oral QHS  . multivitamin  1 tablet Oral QHS  . sodium chloride  3 mL Intravenous Q12H    Recommendations: 1. Observe off of antibiotics for now but start. IV vancomycin and cefepime once lumbar aspirate has been obtained   Assessment: She has severe, recurrent lumbar infection. I definitely agree with holding off on empiric antibiotic therapy pending repeat aspiration. Once the aspiration has been completed I would start her on IV vancomycin and cefepime pending culture results. I will followup tomorrow.    HPI: Nicole [name] is a 79 y.o. female who was hospitalized in January of this year with lumbar discitis. Blood cultures and lumbar aspirate cultures were negative and she was treated with 6 weeks with IV vancomycin and ceftazadime culture negative infection. Several days ago she began to have severe her lower back pain radiating into her right thigh leading to her readmission. MRI reveals progressive L2-3 discitis and bilateral psoas abscesses. She is not aware of any recent  fever.   Review of Systems: Review of systems not obtained due to patient factors.  Past Medical History  Diagnosis Date  . Hypertension   . ESRD (end stage renal disease)   . Peripheral vascular disease     revascularization appx. 14 years ago  . Hypothyroidism   . Hiatal hernia   . Iron deficiency anemia   . Osteoarthritis   . Depression with anxiety   . Pulmonary embolism   . AAA (abdominal aortic aneurysm) 1999    aortic byfem bypass    History  Substance Use Topics  . Smoking status: Former Games developer  . Smokeless tobacco: Not on file  . Alcohol Use: No    History reviewed. No pertinent family history. No Known Allergies  OBJECTIVE: Blood pressure 128/62, pulse 71, temperature 98.1 F (36.7 C), temperature source Oral, resp. rate 22, SpO2 96.00%. General: She is currently on hemodialysis. She is complaining of severe pain and has trouble answering my questions. Skin: Right arm dialysis access site appears normal Oral: Full dentures. No oropharyngeal lesions Lungs: Clear Cor: Regular S1 and S2 no murmurs Abdomen: Soft and nontender Joints and extremities: Normal  Microbiology: No results found for this or any previous visit (from the past 240 hour(s)).  Cliffton Asters, MD [facility] [phone] pager   [phone] cell 09/05/2012, 6:37 PM

## 2012-09-05 NOTE — ED Notes (Signed)
Pt refused second round of blood cultures.

## 2012-09-05 NOTE — ED Notes (Signed)
[place] sandwich given to pt per PA's approval.

## 2012-09-05 NOTE — Progress Notes (Signed)
Patient ID: Nicole [name], female   DOB: 09-Jul-1932, 79 y.o.   MRN: [id number] Request received for L2-3 disc space aspiration in pt with hx ESRD, low back/rt leg pain and recurrent diskitis/osteomyelitis. Imaging studies were reviewed by Dr. Corliss Skains. Additional PMH as below. Exam: pt awake/alert; chest- CTA bilat ant; heart- RRR; abd- soft,+BS,NT;ext- no edema; rt arm AVG; mod paravertebral tenderness L2-3 region.   Filed Vitals:   09/05/12 1430 09/05/12 1500 09/05/12 1522 09/05/12 1530  BP: 232/73 240/73 233/74 233/74  Pulse: 50 40    Temp:      TempSrc:      Resp: 16 16    SpO2: 100% 96%     Past Medical History  Diagnosis Date  . Hypertension   . ESRD (end stage renal disease)   . Peripheral vascular disease     revascularization appx. 14 years ago  . Hypothyroidism   . Hiatal hernia   . Iron deficiency anemia   . Osteoarthritis   . Depression with anxiety   . Pulmonary embolism   . AAA (abdominal aortic aneurysm) 1999    aortic byfem bypass   Past Surgical History  Procedure Laterality Date  . Abdominal surgery  about 14 years ago    REVASCULARIZATION  . Ateriovenous graft  04/18/2009    LEFT FOREARM avg  . Av fistula placement, radiocephalic  01/09/10  . Avgg removal  12/02/09    SECONDARY TO INFECTION LT FOREARM  . Av fistula placement, radiocephalic  05/08/10    NONMATURED  . Arteriovenous graft placement  07/24/10    RIGHT FOREARM LOOP  . Dg av dialysis graft declot or  12/05/10, 6/17/123, 09/24/11, 01/26/12    CLOTTED GRAFT  . Angioplasty  12/21/11    of RUE AVG   Dg Lumbar Spine Complete  09/05/2012   *RADIOLOGY REPORT*  Clinical Data: Low back pain and right leg pain  LUMBAR SPINE - COMPLETE 4+ VIEW  Comparison: 03/20/2012  Findings: Five lumbar type vertebral bodies are well visualized. There are chronic changes noted at the L2-3 interspace with destruction of the endplates and sclerosis.  The overall appearance is stable.  Chronic anterolisthesis of L4 on L5 is  seen.  No acute compression deformity is noted.  IMPRESSION: Chronic changes without acute abnormality.   Original Report Authenticated By: Alcide Clever, M.D.   Mr Lumbar Spine Wo Contrast  09/05/2012   *RADIOLOGY REPORT*  Clinical Data: Low back pain with right leg pain.  History of diskitis.  Dialysis patient. Intravenous contrast not administered due to dialysis.  MRI LUMBAR SPINE WITHOUT CONTRAST  Technique:  Multiplanar and multiecho pulse sequences of the lumbar spine were obtained without intravenous contrast.  Comparison: Lumbar MRI 03/21/2012  Findings: Progressive changes at L2-3 compared with the prior study.  There is endplate destruction with increased fluid in the disc space and increase in bone marrow in the edema at L2 and L3. The findings are suggestive of recurrent diskitis.  In addition, there are fluid collections in the psoas muscle bilaterally which were not present previously consistent with abscess.  This measures 23 mm in the right psoas muscle and 12 mm in the left psoas muscle. Conus medullaris is normal and terminates at L1-2.  L1-2:  Negative  L2-3:  Progressive changes of diskitis and osteomyelitis compared with the prior MRI.  See above comments.  There is extensive osteophyte formation causing severe spinal stenosis which is similar to the prior study.  There is facet and ligamentum flavum  hypertrophy and foraminal encroachment bilaterally with impingement of the L2 nerve root bilaterally.  L3-4:  Negative  L4-5:  12 mm anterior slip is unchanged.  There is severe disc degeneration.  There is impingement of the L4 nerve root bilaterally due to marked foraminal encroachment which is unchanged.  There is mild to spinal stenosis.  L5-S1:  Disc degeneration and spondylosis is unchanged.  Mild foraminal narrowing bilaterally.  IMPRESSION: Progressive changes at L2-3 consistent with recurrent diskitis and osteomyelitis. Interval development of bilateral psoas abscess. There is severe  spinal stenosis at L2-3 related to spondylosis and facet hypertrophy.  This is unchanged.  No epidural abscess is identified.  12 mm anterior slip L4-5 with marked foraminal encroachment bilaterally.  This is unchanged from the prior MRI.   Original Report Authenticated By: Janeece Riggers, M.D.   Results for orders placed during the hospital encounter of 09/05/12  CBC WITH DIFFERENTIAL      Result Value Range   WBC 5.9  4.0 - 10.5 K/uL   RBC 3.64 (*) 3.87 - 5.11 MIL/uL   Hemoglobin 12.4  12.0 - 15.0 g/dL   HCT 16.1  09.6 - 04.5 %   MCV 102.2 (*) 78.0 - 100.0 fL   MCH 34.1 (*) 26.0 - 34.0 pg   MCHC 33.3  30.0 - 36.0 g/dL   RDW 40.9  81.1 - 91.4 %   Platelets 352  150 - 400 K/uL   Neutrophils Relative % 54  43 - 77 %   Neutro Abs 3.2  1.7 - 7.7 K/uL   Lymphocytes Relative 32  12 - 46 %   Lymphs Abs 1.9  0.7 - 4.0 K/uL   Monocytes Relative 11  3 - 12 %   Monocytes Absolute 0.6  0.1 - 1.0 K/uL   Eosinophils Relative 3  0 - 5 %   Eosinophils Absolute 0.2  0.0 - 0.7 K/uL   Basophils Relative 0  0 - 1 %   Basophils Absolute 0.0  0.0 - 0.1 K/uL  BASIC METABOLIC PANEL      Result Value Range   Sodium 135  135 - 145 mEq/L   Potassium 4.2  3.5 - 5.1 mEq/L   Chloride 91 (*) 96 - 112 mEq/L   CO2 25  19 - 32 mEq/L   Glucose, Bld 86  70 - 99 mg/dL   BUN 50 (*) 6 - 23 mg/dL   Creatinine, Ser 78.29 (*) 0.50 - 1.10 mg/dL   Calcium 8.9  8.4 - 56.2 mg/dL   GFR calc non Af Amer 3 (*) >90 mL/min   GFR calc Af Amer 3 (*) >90 mL/min   A/P: Pt with hx low back/ rt leg pain and evidence of recurrent L2-3 diskitis on recent MRI. Plan is for L2-3 disc space aspiration on 7/2. Details/risks of procedure d/w pt with her understanding and consent.

## 2012-09-05 NOTE — Consult Note (Signed)
[facility] Renal Consultation Note    Indication for Consultation:  Management of ESRD/hemodialysis; anemia, hypertension/volume and secondary hyperparathyroidism  HPI: Nicole [name] is a 79 y.o. female with ESRD on TTS dialysis at [facility] who presents with worsening low back pain and pain radiating into her right leg - much like earlier this year in January where she was subsequently diagnosed with lumbar diskitis during which time she received empiric 6 weeks of Vanc and Fortaz as [facility] and aspirated culture showed no growth in January 2014.  She is afebrile and does not have an elevated WBC or abnormal diff, but MRI shows diskitis/osteo at L2-3 and bilateral psoas abscesses. She reports the onset of pain in back about 3-4 days ago. Unable to walk into dialysis- needed a wheel chair.  It finally got so bad she asked her son to bring her to the ED. She has pain with movement on the stretcher.  Denies fever, chills, SOB, CP, N or V. Recently had to take ex-lax due to severe constipation but then had a BM. Appetite has been poor; denies swallowing problems;  She usually manages her own medications. She still makes a small amount of urine. She is due for dialysis today.  Past Medical History  Diagnosis Date  . Hypertension   . ESRD (end stage renal disease)   . Peripheral vascular disease     revascularization appx. 14 years ago  . Hypothyroidism   . Hiatal hernia   . Iron deficiency anemia   . Osteoarthritis   . Depression with anxiety   . Pulmonary embolism   . AAA (abdominal aortic aneurysm) 1999    aortic byfem bypass   Past Surgical History  Procedure Laterality Date  . Abdominal surgery  about 14 years ago    REVASCULARIZATION  . Ateriovenous graft  04/18/2009    LEFT FOREARM avg  . Av fistula placement, radiocephalic  01/09/10  . Avgg removal  12/02/09    SECONDARY TO INFECTION LT FOREARM  . Av fistula placement, radiocephalic  05/08/10    NONMATURED  . Arteriovenous graft  placement  07/24/10    RIGHT FOREARM LOOP  . Dg av dialysis graft declot or  12/05/10, 6/17/123, 09/24/11, 01/26/12    CLOTTED GRAFT  . Angioplasty  12/21/11    of RUE AVG   No family history on file. She does not know PMHx of mother or father Social History: Lives with son and his wife  reports that she has quit smoking. She does not have any smokeless tobacco history on file. She reports that she does not drink alcohol or use illicit drugs. No Known Allergies Prior to Admission medications   Medication Sig Start Date End Date Taking? Authorizing Provider  amLODipine (NORVASC) 10 MG tablet Take 10 mg by mouth at bedtime.   Yes Historical Provider, MD  dextrose 5 % SOLN 50 mL with cefTAZidime 2 G SOLR 2 g Inject 2 g into the vein 3 (three) times a week. 03/28/12  Yes Sorin Luanne Bras, MD  HYDROcodone-acetaminophen (NORCO/VICODIN) 5-325 MG per tablet Take 1 tablet by mouth every 6 (six) hours as needed for pain. 03/28/12  Yes Sorin Luanne Bras, MD  ibuprofen (ADVIL,MOTRIN) 400 MG tablet Take 1 tablet (400 mg total) by mouth every 6 (six) hours as needed for pain. 02/04/12  Yes Remi Haggard, NP  levothyroxine (SYNTHROID, LEVOTHROID) 150 MCG tablet Take 1 tablet (150 mcg total) by mouth daily before breakfast. 03/28/12  Yes Sorin Luanne Bras, MD  lisinopril (PRINIVIL,ZESTRIL) 40 MG tablet Take 40 mg by mouth at bedtime.   Yes Historical Provider, MD  meloxicam (MOBIC) 15 MG tablet Take 1 tablet (15 mg total) by mouth daily. 03/18/12  Yes Hannah Muthersbaugh, PA-C  multivitamin (RENA-VIT) TABS tablet Take 1 tablet by mouth at bedtime. 06/15/11  Yes Maree Krabbe, MD  feeding supplement (RESOURCE BREEZE) LIQD Take 1 Container by mouth 3 (three) times daily between meals. 03/28/12   Sorin Luanne Bras, MD   Current Facility-Administered Medications  Medication Dose Route Frequency Provider Last Rate Last Dose  . 0.9 %  sodium chloride infusion   Intravenous Once Shari A Upstill, PA-C      . 0.9 %  sodium chloride infusion    Intravenous STAT Shari A Upstill, PA-C 10 mL/hr at 09/05/12 1253    . HYDROmorphone (DILAUDID) injection 0.5 mg  0.5 mg Intravenous Q3H PRN Melvenia Beam A Upstill, PA-C   0.5 mg at 09/05/12 1254   Current Outpatient Prescriptions  Medication Sig Dispense Refill  . amLODipine (NORVASC) 10 MG tablet Take 10 mg by mouth at bedtime.      Marland Kitchen dextrose 5 % SOLN 50 mL with cefTAZidime 2 G SOLR 2 g Inject 2 g into the vein 3 (three) times a week.      Marland Kitchen HYDROcodone-acetaminophen (NORCO/VICODIN) 5-325 MG per tablet Take 1 tablet by mouth every 6 (six) hours as needed for pain.  30 tablet  0  . ibuprofen (ADVIL,MOTRIN) 400 MG tablet Take 1 tablet (400 mg total) by mouth every 6 (six) hours as needed for pain.  30 tablet  0  . levothyroxine (SYNTHROID, LEVOTHROID) 150 MCG tablet Take 1 tablet (150 mcg total) by mouth daily before breakfast.      . lisinopril (PRINIVIL,ZESTRIL) 40 MG tablet Take 40 mg by mouth at bedtime.      . meloxicam (MOBIC) 15 MG tablet Take 1 tablet (15 mg total) by mouth daily.  30 tablet  2  . multivitamin (RENA-VIT) TABS tablet Take 1 tablet by mouth at bedtime.      . feeding supplement (RESOURCE BREEZE) LIQD Take 1 Container by mouth 3 (three) times daily between meals.       Labs: Basic Metabolic Panel:  Recent Labs Lab 09/05/12 0833  NA 135  K 4.2  CL 91*  CO2 25  GLUCOSE 86  BUN 50*  CREATININE 11.77*  CALCIUM 8.9  CBC:  Recent Labs Lab 09/05/12 0833  WBC 5.9  NEUTROABS 3.2  HGB 12.4  HCT 37.2  MCV 102.2*  PLT 352  Dg Lumbar Spine Complete  09/05/2012   *RADIOLOGY REPORT*  Clinical Data: Low back pain and right leg pain  LUMBAR SPINE - COMPLETE 4+ VIEW  Comparison: 03/20/2012  Findings: Five lumbar type vertebral bodies are well visualized. There are chronic changes noted at the L2-3 interspace with destruction of the endplates and sclerosis.  The overall appearance is stable.  Chronic anterolisthesis of L4 on L5 is seen.  No acute compression deformity is noted.   IMPRESSION: Chronic changes without acute abnormality.   Original Report Authenticated By: Alcide Clever, M.D.   Mr Lumbar Spine Wo Contrast  09/05/2012   *RADIOLOGY REPORT*  Clinical Data: Low back pain with right leg pain.  History of diskitis.  Dialysis patient. Intravenous contrast not administered due to dialysis.  MRI LUMBAR SPINE WITHOUT CONTRAST  Technique:  Multiplanar and multiecho pulse sequences of the lumbar spine were obtained without intravenous contrast.  Comparison: Lumbar MRI  03/21/2012  Findings: Progressive changes at L2-3 compared with the prior study.  There is endplate destruction with increased fluid in the disc space and increase in bone marrow in the edema at L2 and L3. The findings are suggestive of recurrent diskitis.  In addition, there are fluid collections in the psoas muscle bilaterally which were not present previously consistent with abscess.  This measures 23 mm in the right psoas muscle and 12 mm in the left psoas muscle. Conus medullaris is normal and terminates at L1-2.  L1-2:  Negative  L2-3:  Progressive changes of diskitis and osteomyelitis compared with the prior MRI.  See above comments.  There is extensive osteophyte formation causing severe spinal stenosis which is similar to the prior study.  There is facet and ligamentum flavum hypertrophy and foraminal encroachment bilaterally with impingement of the L2 nerve root bilaterally.  L3-4:  Negative  L4-5:  12 mm anterior slip is unchanged.  There is severe disc degeneration.  There is impingement of the L4 nerve root bilaterally due to marked foraminal encroachment which is unchanged.  There is mild to spinal stenosis.  L5-S1:  Disc degeneration and spondylosis is unchanged.  Mild foraminal narrowing bilaterally.  IMPRESSION: Progressive changes at L2-3 consistent with recurrent diskitis and osteomyelitis. Interval development of bilateral psoas abscess. There is severe spinal stenosis at L2-3 related to spondylosis and  facet hypertrophy.  This is unchanged.  No epidural abscess is identified.  12 mm anterior slip L4-5 with marked foraminal encroachment bilaterally.  This is unchanged from the prior MRI.   Original Report Authenticated By: Janeece Riggers, M.D.   ROS: As per HPI otherwise negative  Physical Exam: Filed Vitals:   09/05/12 0915 09/05/12 1130 09/05/12 1232 09/05/12 1258  BP: 241/76 183/84 220/68 220/68  Pulse: 47 56  61  Temp:      TempSrc:      Resp: 20 16  21   SpO2: 99% 96%  94%     General: Slender elderly lady alert, NAD Head: Normocephalic, atraumatic, sclera non-icteric, mucus membranes are moist Neck: Supple. JVD not elevated. Lungs: Clear bilaterally to auscultation without wheezes, rales, or rhonchi. Breathing is unlabored. Heart:  Bradycardia - rate ~50s Abdomen: Soft, non-tender, non-distended with normoactive bowel sounds. No rebound/guarding. . Lower extremities: without edema or ischemic changes, no open wounds/fungal nails  Neuro: Alert and oriented . Moves all extremities spontaneously. Psych:  Responds to questions appropriately with a normal affect. Skin: dry and lax Dialysis Access:right lower AVGG + bruit  Dialysis Orders: Center: [facility] TTS 4 hr optiflux 160 400/A 1/5 heparin 6000 2K 2.25 Ca EDW 52 Recent labs:  Hgb 13.3 Feb109 and 61%sat 6/28, Ca and P ok - iPTH 196 in April right lower AVGG hectorol 1 no Epo - last dose 9000 6/14 and venofer 50/week (doesn't need)  Assessment/Plan: 1.  Recurrent lumbar (L 2-3) diskitis/osteolyelitis with new bilateral psoas abscesses - refused second blood culture - can be drawn on dialysis today; management per primary service (Teaching Service) 2.  ESRD -  TTS - HD today per routine 3.  Hypertension/volume  - More hypertensive than usual; decrease volume and continue lisinopril 40 and norvasc 10 and re-evaluate; re-evaulate BP in controlled environment when we can monitor meds; of note: she was given clonidine 0.2 bid during Jan  admission but wasn't taking after d/c  Will increase goal to 3kg UF 4.  Anemia  - Hgb 12 no ESA or Fe warranted 5.  Metabolic bone disease -  Continue hectorol 1 and binder (on 2 phoslo ac at the time of d/c in [place] 6.  Nutrition - will need renal diet and multivit- add nepro if not eating; review of records shows she has lost 6 kg about 10% over the past 6 months 7. Hypothyroidism - continue synthroid - hx of poor control - TSH was 25 1/13 22 1/23 and 17 in April of this year - I suspect the issue may be more related to compliance with medications as this has been a longstanding issue at the dialysis center; check TSH pre HD.  Synthroid dose had been increased to 150 mcg in January 8. Bradycardia - had tele monitoring of sinus brady 1/17/124; HR variable 49 - 60 - did get one dose of clonidine, but usually on norvasc and lisinopril; rec. check EKG  (review of notes from 03/2012 show HR 46- 70s while she was on clonidine 0.2 bid during January admission.  Sheffield Slider, PA-C [facility] Beeper [phone] 09/05/2012, 2:37 PM   Patient seen and examined.  I agree with plan as above with additions as indicated. Vinson Moselle  MD [employer] Pager [phone]    Cell  [phone] 09/05/2012, 5:00 PM

## 2012-09-05 NOTE — ED Notes (Signed)
Patient transported to X-ray 

## 2012-09-05 NOTE — Progress Notes (Signed)
Pt arterial access not working well; was unable to raise BFR above 200 due to increased Arterial Pressure; was power flushing the arterial access throughout tx. System started clotting with 15 mins of tx left; unable to rinseback the arterial line.

## 2012-09-05 NOTE — Procedures (Signed)
I was present at this dialysis session. I have reviewed the session itself and made appropriate changes.   Vinson Moselle, MD [employer] 09/05/2012, 5:00 PM

## 2012-09-05 NOTE — Progress Notes (Signed)
Difficult cannulation to arterial side of graft. Graft cannulated x3. Initial arterial cannulation with high arterial pressures. Arterial needle removed with visible clots in arterial line. Arterial side re cannulated with a blood flow rate of 200 ml/min

## 2012-09-06 ENCOUNTER — Inpatient Hospital Stay (HOSPITAL_COMMUNITY): Payer: No Typology Code available for payment source

## 2012-09-06 LAB — CBC
HCT: 38.5 % (ref 36.0–46.0)
Hemoglobin: 12.5 g/dL (ref 12.0–15.0)
MCH: 33.7 pg (ref 26.0–34.0)
MCHC: 32.5 g/dL (ref 30.0–36.0)
MCV: 103.8 fL — ABNORMAL HIGH (ref 78.0–100.0)
Platelets: 314 10*3/uL (ref 150–400)
RBC: 3.71 MIL/uL — ABNORMAL LOW (ref 3.87–5.11)
RDW: 15.3 % (ref 11.5–15.5)
WBC: 4.4 10*3/uL (ref 4.0–10.5)

## 2012-09-06 LAB — BASIC METABOLIC PANEL
BUN: 31 mg/dL — ABNORMAL HIGH (ref 6–23)
CO2: 29 mEq/L (ref 19–32)
Calcium: 9.1 mg/dL (ref 8.4–10.5)
Chloride: 94 mEq/L — ABNORMAL LOW (ref 96–112)
Creatinine, Ser: 9.16 mg/dL — ABNORMAL HIGH (ref 0.50–1.10)
GFR calc Af Amer: 4 mL/min — ABNORMAL LOW (ref >90)
GFR calc non Af Amer: 4 mL/min — ABNORMAL LOW (ref >90)
Glucose, Bld: 172 mg/dL — ABNORMAL HIGH (ref 70–99)
Potassium: 4.4 mEq/L (ref 3.5–5.1)
Sodium: 136 mEq/L (ref 135–145)

## 2012-09-06 LAB — APTT: aPTT: 35 seconds (ref 24–37)

## 2012-09-06 LAB — PROTIME-INR
INR: 0.97 (ref 0.00–1.49)
Prothrombin Time: 12.7 seconds (ref 11.6–15.2)

## 2012-09-06 MED ORDER — VANCOMYCIN HCL 500 MG IV SOLR
500.0000 mg | INTRAVENOUS | Status: DC
  Administered 2012-09-08 – 2012-09-12 (×3): 500 mg via INTRAVENOUS
  Filled 2012-09-06 (×7): qty 500

## 2012-09-06 MED ORDER — POLYETHYLENE GLYCOL 3350 17 G PO PACK
17.0000 g | PACK | Freq: Every day | ORAL | Status: DC
  Administered 2012-09-06 – 2012-09-08 (×3): 17 g via ORAL
  Filled 2012-09-06 (×4): qty 1

## 2012-09-06 MED ORDER — SODIUM CHLORIDE 0.9 % IV SOLN
INTRAVENOUS | Status: AC
  Administered 2012-09-06: 23:00:00 via INTRAVENOUS

## 2012-09-06 MED ORDER — FENTANYL CITRATE 0.05 MG/ML IJ SOLN
INTRAMUSCULAR | Status: AC | PRN
  Administered 2012-09-06 (×3): 25 ug via INTRAVENOUS

## 2012-09-06 MED ORDER — HYDROMORPHONE HCL PF 1 MG/ML IJ SOLN
INTRAMUSCULAR | Status: AC
  Filled 2012-09-06: qty 2

## 2012-09-06 MED ORDER — FENTANYL CITRATE 0.05 MG/ML IJ SOLN
INTRAMUSCULAR | Status: AC
  Filled 2012-09-06: qty 4

## 2012-09-06 MED ORDER — DEXTROSE 5 % IV SOLN
2.0000 g | INTRAVENOUS | Status: DC
  Administered 2012-09-08 – 2012-09-14 (×4): 2 g via INTRAVENOUS
  Filled 2012-09-06 (×6): qty 2

## 2012-09-06 MED ORDER — MIDAZOLAM HCL 2 MG/2ML IJ SOLN
INTRAMUSCULAR | Status: AC
  Filled 2012-09-06: qty 6

## 2012-09-06 MED ORDER — HEPARIN SODIUM (PORCINE) 5000 UNIT/ML IJ SOLN
5000.0000 [IU] | Freq: Three times a day (TID) | INTRAMUSCULAR | Status: DC
  Administered 2012-09-07 – 2012-09-10 (×7): 5000 [IU] via SUBCUTANEOUS
  Filled 2012-09-06 (×16): qty 1

## 2012-09-06 MED ORDER — DOCUSATE SODIUM 100 MG PO CAPS
100.0000 mg | ORAL_CAPSULE | Freq: Two times a day (BID) | ORAL | Status: DC
  Administered 2012-09-06 – 2012-09-15 (×15): 100 mg via ORAL
  Filled 2012-09-06 (×20): qty 1

## 2012-09-06 MED ORDER — VANCOMYCIN HCL IN DEXTROSE 1-5 GM/200ML-% IV SOLN
1000.0000 mg | Freq: Once | INTRAVENOUS | Status: AC
  Administered 2012-09-06: 1000 mg via INTRAVENOUS
  Filled 2012-09-06: qty 200

## 2012-09-06 MED ORDER — HEPARIN SODIUM (PORCINE) 5000 UNIT/ML IJ SOLN
5000.0000 [IU] | Freq: Three times a day (TID) | INTRAMUSCULAR | Status: DC
  Filled 2012-09-06 (×3): qty 1

## 2012-09-06 MED ORDER — DEXTROSE 5 % IV SOLN
2.0000 g | Freq: Once | INTRAVENOUS | Status: AC
  Administered 2012-09-06: 2 g via INTRAVENOUS
  Filled 2012-09-06: qty 2

## 2012-09-06 MED ORDER — MIDAZOLAM HCL 2 MG/2ML IJ SOLN
INTRAMUSCULAR | Status: AC | PRN
  Administered 2012-09-06 (×3): 1 mg via INTRAVENOUS

## 2012-09-06 MED ORDER — HYDROMORPHONE HCL PF 1 MG/ML IJ SOLN
0.5000 mg | INTRAMUSCULAR | Status: DC | PRN
  Administered 2012-09-06 – 2012-09-07 (×3): 0.5 mg via INTRAVENOUS
  Filled 2012-09-06 (×2): qty 1

## 2012-09-06 NOTE — Progress Notes (Signed)
Subjective: Patient reports pain is decreased from IV dilaudid.  Doing well, would like to eat something, was told she can get a diet following the CT aspiration.  Denies any abdominal pain. Denies any fever/chills. Objective: Vital signs in last 24 hours: Filed Vitals:   09/06/12 0400 09/06/12 0500 09/06/12 0800 09/06/12 1100  BP: 130/45  139/49 161/72  Pulse: 51     Temp: 98.5 F (36.9 C)  98.4 F (36.9 C) 98.4 F (36.9 C)  TempSrc: Oral  Oral Oral  Resp: 21     Height:      Weight:  111 lb 12.4 oz (50.7 kg)    SpO2: 99%      Weight change:   Intake/Output Summary (Last 24 hours) at 09/06/12 1351 Last data filed at 09/06/12 0100  Gross per 24 hour  Intake 124.17 ml  Output   1755 ml  Net -1630.83 ml   General: resting in bed  Cardiac: RRR, no rubs, murmurs or gallops  Pulm: clear to auscultation bilaterally  Abd: soft, nontender, nondistended, BS normalactive  Ext: warm and well perfused, no pedal edema. Neuro: alert and oriented X3,    Lab Results: Basic Metabolic Panel:  Recent Labs Lab 09/05/12 0833 09/06/12 0440  NA 135 136  K 4.2 4.4  CL 91* 94*  CO2 25 29  GLUCOSE 86 172*  BUN 50* 31*  CREATININE 11.77* 9.16*  CALCIUM 8.9 9.1   CBC:  Recent Labs Lab 09/05/12 0833 09/06/12 0440  WBC 5.9 4.4  NEUTROABS 3.2  --   HGB 12.4 12.5  HCT 37.2 38.5  MCV 102.2* 103.8*  PLT 352 314   Thyroid Function Tests:  Recent Labs Lab 09/05/12 1524  TSH 114.710*   Coagulation:  Recent Labs Lab 09/06/12 0440  LABPROT 12.7  INR 0.97    Micro Results: Recent Results (from the past 240 hour(s))  CULTURE, BLOOD (ROUTINE X 2)     Status: None   Collection Time    09/05/12  1:00 PM      Result Value Range Status   Specimen Description BLOOD HAND LEFT   Final   Special Requests BOTTLES DRAWN AEROBIC AND ANAEROBIC 4CC   Final   Culture  Setup Time 09/05/2012 16:21   Final   Culture     Final   Value: GRAM POSITIVE COCCI IN CLUSTERS     Note: Gram  Stain Report Called to,Read Back By and Verified With: Marney Setting @ [phone] 09/06/12 BY KRAWS   Report Status PENDING   Incomplete  MRSA PCR SCREENING     Status: None   Collection Time    09/05/12  9:48 PM      Result Value Range Status   MRSA by PCR NEGATIVE  NEGATIVE Final   Comment:            The GeneXpert MRSA Assay (FDA     approved for NASAL specimens     only), is one component of a     comprehensive MRSA colonization     surveillance program. It is not     intended to diagnose MRSA     infection nor to guide or     monitor treatment for     MRSA infections.   Studies/Results: Dg Lumbar Spine Complete  09/05/2012   *RADIOLOGY REPORT*  Clinical Data: Low back pain and right leg pain  LUMBAR SPINE - COMPLETE 4+ VIEW  Comparison: 03/20/2012  Findings: Five lumbar type vertebral bodies are  well visualized. There are chronic changes noted at the L2-3 interspace with destruction of the endplates and sclerosis.  The overall appearance is stable.  Chronic anterolisthesis of L4 on L5 is seen.  No acute compression deformity is noted.  IMPRESSION: Chronic changes without acute abnormality.   Original Report Authenticated By: Alcide Clever, M.D.   Mr Lumbar Spine Wo Contrast  09/05/2012   *RADIOLOGY REPORT*  Clinical Data: Low back pain with right leg pain.  History of diskitis.  Dialysis patient. Intravenous contrast not administered due to dialysis.  MRI LUMBAR SPINE WITHOUT CONTRAST  Technique:  Multiplanar and multiecho pulse sequences of the lumbar spine were obtained without intravenous contrast.  Comparison: Lumbar MRI 03/21/2012  Findings: Progressive changes at L2-3 compared with the prior study.  There is endplate destruction with increased fluid in the disc space and increase in bone marrow in the edema at L2 and L3. The findings are suggestive of recurrent diskitis.  In addition, there are fluid collections in the psoas muscle bilaterally which were not present previously consistent with  abscess.  This measures 23 mm in the right psoas muscle and 12 mm in the left psoas muscle. Conus medullaris is normal and terminates at L1-2.  L1-2:  Negative  L2-3:  Progressive changes of diskitis and osteomyelitis compared with the prior MRI.  See above comments.  There is extensive osteophyte formation causing severe spinal stenosis which is similar to the prior study.  There is facet and ligamentum flavum hypertrophy and foraminal encroachment bilaterally with impingement of the L2 nerve root bilaterally.  L3-4:  Negative  L4-5:  12 mm anterior slip is unchanged.  There is severe disc degeneration.  There is impingement of the L4 nerve root bilaterally due to marked foraminal encroachment which is unchanged.  There is mild to spinal stenosis.  L5-S1:  Disc degeneration and spondylosis is unchanged.  Mild foraminal narrowing bilaterally.  IMPRESSION: Progressive changes at L2-3 consistent with recurrent diskitis and osteomyelitis. Interval development of bilateral psoas abscess. There is severe spinal stenosis at L2-3 related to spondylosis and facet hypertrophy.  This is unchanged.  No epidural abscess is identified.  12 mm anterior slip L4-5 with marked foraminal encroachment bilaterally.  This is unchanged from the prior MRI.   Original Report Authenticated By: Janeece Riggers, M.D.   Medications: I have reviewed the patient's current medications. Scheduled Meds: . amLODipine  10 mg Oral QHS  . calcium acetate  1,334 mg Oral TID WC  . docusate sodium  100 mg Oral BID  . [START ON 09/07/2012] doxercalciferol  1 mcg Intravenous Q T,Th,Sa-HD  . feeding supplement (NEPRO CARB STEADY)  237 mL Oral BID BM  . [START ON 09/07/2012] heparin  5,000 Units Subcutaneous Q8H  . levothyroxine  150 mcg Oral QAC breakfast  . lisinopril  40 mg Oral QHS  . multivitamin  1 tablet Oral QHS  . polyethylene glycol  17 g Oral Daily  . sodium chloride  3 mL Intravenous Q12H   Continuous Infusions:  PRN Meds:.HYDROmorphone  (DILAUDID) injection Assessment/Plan: Principal Problem:   Osteomyelitis of lumbar spine Active Problems:   Hypertension   ESRD (end stage renal disease) on dialysis   PVD (peripheral vascular disease)   Hypothyroidism   Anemia in chronic kidney disease(285.21)   Discitis of lumbar region   Psoas abscess  Principal Problem:  Osteomyelitis/Discitis of lumbar spine with B/L Psoas abscess -MRI revealed progressive changes consistent with recurrent diskitis and osteomyelitis with b/l psoas abscess. No epidural  abscess seen on MRI. She was diagnosed with Lumbar discitis and osteomyelitis in January 2014 but aspirated culture showed no growth and she was treated with 6 weeks of Vanc and [place].  -Patient with ESRD on dialysis, port is likely source of bacteremia to cause osteomyelitis  -Initial Blood culture obtained 09/05/12 has + gram stain showing G+ cocci in clusters -CT Aspiration completed   - Order Vanc and Cefepime per pharmacy. Will narrow Abx coverage after speciation and sensitivities are obtained. -Continue Dilaudid 0.5mg  Q4 PRN. -CRP 3.7, Sed rate 54.  Will monitor for effectiveness of Abx treatment. Active Problems:  Accelerated Hypertension resolved -BP decreased after HD.  Remains elevated, home HTN meds resumed.  -Continue to monitor.  ESRD (end stage renal disease) on dialysis  -Normal Dialysis schedule is T, TH, S. (She follows with Dr. Geanie Berlin, HD at [facility])  Cont normal schedule.  -She does make a small amount of urine normally.  Bradycardia-resolved PVD (peripheral vascular disease)  -No ulcers noted.  Hypothyroidism  -Continue home synthroid dose of . -TSH on 09/05/12 is 114.  Unknown compliance with home medication.  Anemia in chronic kidney disease(285.21)  -Patient with ESRD, normally hemoglobin runs 8-9 currently hemoglobin is 12.4 before HD. Will continue to monitor.  -Hgb today 12.5, no need for EPO Severe spinal stenosis at L2-3  -Unchanged compared to  previous MRI.  #Code Status: full code  Dispo: Disposition is deferred at this time, awaiting improvement of current medical problems. Anticipated discharge in approximately 1-2 day(s).  The patient does have a current PCP [name], MD) and does not need an [facility] hospital follow-up appointment after discharge.  The patient does not have transportation limitations that hinder transportation to clinic appointments.  .Services Needed at time of discharge: Y = Yes, Blank = No PT:   OT:   RN:   Equipment:   Other:     LOS: 1 day   Carlynn Purl, DO 09/06/2012, 1:51 PM

## 2012-09-06 NOTE — Progress Notes (Signed)
Dr. Mikey Bussing notified regarding Blood cultures growing gram + cocci in clusters in Aerobic bottle. No orders given.

## 2012-09-06 NOTE — H&P (Signed)
INTERNAL MEDICINE TEACHING SERVICE Attending Admission Note  Date: 09/06/2012  Patient name: Nicole [name]  Medical record number: [id number]  Date of birth: [birth year]    I have seen and evaluated Nicole [name] and discussed their care with the Residency Team.  80 yr. Old AAF w/ ESRD on HD, PVD, depression, hx AAA, PVD, who has been treated in Jan '14 for lumbar diskitis with 6 weeks Vanc/Ceftaz, presented with back pain radiating to left leg. She exhibits to weakness.  MRI L spine shows L2-L3 diskitis and osteomyelitis. She is to have lumbar aspiration. BC. She then needs to be started on Vanc and cefepime. ID has been consulted.  Jonah Blue, DO 7/2/20142:01 PM

## 2012-09-06 NOTE — Progress Notes (Signed)
Report called to [phone] 

## 2012-09-06 NOTE — Progress Notes (Signed)
CRITICAL VALUE ALERT  Critical value received:  Blood culture  Date of notification:  09/05/12  Time of notification:  0830  Critical value read back:yes  Nurse who received alert: Wilford Grist  MD notified (1st page Mikey Bussing  Time of first page:  0950  MD notified (2nd page):  Time of second page:  Responding MD:  Mikey Bussing  Time MD responded:  [phone]

## 2012-09-06 NOTE — Progress Notes (Signed)
Utilization review completed.  P.J. Adele Milson,RN,BSN Case Manager [phone]  

## 2012-09-06 NOTE — Progress Notes (Signed)
Subjective: Down for disc aspiration  Objective Filed Vitals:   09/05/12 2200 09/06/12 0000 09/06/12 0400 09/06/12 0500  BP: 110/75 133/58 130/45   Pulse: 69 66 51   Temp:  98.5 F (36.9 C) 98.5 F (36.9 C)   TempSrc:  Oral Oral   Resp: 22 16 21    Height:      Weight:    50.7 kg (111 lb 12.4 oz)  SpO2: 97% 96% 99%     CBC:  Recent Labs Lab 09/05/12 0833 09/06/12 0440  WBC 5.9 4.4  NEUTROABS 3.2  --   HGB 12.4 12.5  HCT 37.2 38.5  MCV 102.2* 103.8*  PLT 352 314   Basic Metabolic Panel:  Recent Labs Lab 09/05/12 0833 09/06/12 0440  NA 135 136  K 4.2 4.4  CL 91* 94*  CO2 25 29  GLUCOSE 86 172*  BUN 50* 31*  CREATININE 11.77* 9.16*  CALCIUM 8.9 9.1    Physical Exam:  Blood pressure 130/45, pulse 51, temperature 98.5 F (36.9 C), temperature source Oral, resp. rate 21, height 5\' 3"  (1.6 m), weight 50.7 kg (111 lb 12.4 oz), SpO2 99.00%.  Gen: no distress Neck: Supple. JVD not elevated.  Lungs: Clear bilaterally  Heart: reg no rub or gallop Abdomen: Soft, non-tender, non-distended  ext: without edema or ischemic changes Neuro: Ox3, nonfocal motor exam Dialysis Access:right lower AVGG + bruit   Dialysis Orders ([facility] TTS)   4hr  F160   heparin 6000   2.25 Ca    EDW 52kg   RL AVGG Hectorol 1ug,  no Epo - last dose 9000 6/14,  venofer 50/week (doesn't need)  Labs: Hgb 13.3 Feb109 and 61%sat 6/28, Ca and P ok - iPTH 196 in April  Assess/Plan 1 Lumbar diskitis/osteo, psoas abscess- recurrent, for aspirate proc today; abx 2 CKD, cont hd TTS 3 HTN, below EDW but bp high > cont BB, ACEi, dec vol 4 Anemia, Hb 12, no epo 5 Sec HPT, cont vit D, binder 6 HypoT4- synthroid ^'d 150 mcg Jan '14, compliance questionable 7 Bradycardia - improved, afib 8 Hx pe 9 PVD  Vinson Moselle  MD [facility] Pager [phone]    Cell  [phone] 09/06/2012, 9:24 AM

## 2012-09-06 NOTE — Procedures (Signed)
S/P L2-L3 fluoro guided disc aspiration.

## 2012-09-06 NOTE — ED Provider Notes (Signed)
Medical screening examination/treatment/procedure(s) were conducted as a shared visit with non-physician practitioner(s) and myself.  I personally evaluated the patient during the encounter   Rolan Bucco, MD 09/06/12 1510

## 2012-09-06 NOTE — Progress Notes (Signed)
ANTIBIOTIC CONSULT NOTE - INITIAL  Pharmacy Consult for vancomycin + cefepime Indication: bacteremia/osteomyelitis  No Known Allergies  Patient Measurements: Height: 5\' 3"  (160 cm) Weight: 111 lb 12.4 oz (50.7 kg) IBW/kg (Calculated) : 52.4  Vital Signs: Temp: 98.4 F (36.9 C) (07/02 1100) Temp src: Oral (07/02 1100) BP: 162/71 mmHg (07/02 1425) Pulse Rate: 74 (07/02 1430) Intake/Output from previous day: 07/01 0701 - 07/02 0700 In: 124.2 [I.V.:124.2] Out: 1755  Intake/Output from this shift:    Labs:  Recent Labs  09/05/12 0833 09/06/12 0440  WBC 5.9 4.4  HGB 12.4 12.5  PLT 352 314  CREATININE 11.77* 9.16*   Estimated Creatinine Clearance: 4.1 ml/min (by C-G formula based on Cr of 9.16). No results found for this basename: VANCOTROUGH, Leodis Binet, VANCORANDOM, GENTTROUGH, GENTPEAK, GENTRANDOM, TOBRATROUGH, TOBRAPEAK, TOBRARND, AMIKACINPEAK, AMIKACINTROU, AMIKACIN,  in the last 72 hours   Microbiology: Recent Results (from the past 720 hour(s))  CULTURE, BLOOD (ROUTINE X 2)     Status: None   Collection Time    09/05/12  1:00 PM      Result Value Range Status   Specimen Description BLOOD HAND LEFT   Final   Special Requests BOTTLES DRAWN AEROBIC AND ANAEROBIC 4CC   Final   Culture  Setup Time 09/05/2012 16:21   Final   Culture     Final   Value: GRAM POSITIVE COCCI IN CLUSTERS     Note: Gram Stain Report Called to,Read Back By and Verified With: Marney Setting @ [phone] 09/06/12 BY KRAWS   Report Status PENDING   Incomplete  MRSA PCR SCREENING     Status: None   Collection Time    09/05/12  9:48 PM      Result Value Range Status   MRSA by PCR NEGATIVE  NEGATIVE Final   Comment:            The GeneXpert MRSA Assay (FDA     approved for NASAL specimens     only), is one component of a     comprehensive MRSA colonization     surveillance program. It is not     intended to diagnose MRSA     infection nor to guide or     monitor treatment for     MRSA infections.     Medical History: Past Medical History  Diagnosis Date  . Hypertension   . ESRD (end stage renal disease)   . Peripheral vascular disease     revascularization appx. 14 years ago  . Hypothyroidism   . Hiatal hernia   . Iron deficiency anemia   . Osteoarthritis   . Depression with anxiety   . Pulmonary embolism   . AAA (abdominal aortic aneurysm) 1999    aortic byfem bypass    Medications:  Anti-infectives   Start     Dose/Rate Route Frequency Ordered Stop   09/07/12 1800  ceFEPIme (MAXIPIME) 2 g in dextrose 5 % 50 mL IVPB     2 g 100 mL/hr over 30 Minutes Intravenous Every T-Th-Sa (1800) 09/06/12 1540     09/07/12 1200  vancomycin (VANCOCIN) 500 mg in sodium chloride 0.9 % 100 mL IVPB     500 mg 100 mL/hr over 60 Minutes Intravenous Every T-Th-Sa (Hemodialysis) 09/06/12 1540     09/06/12 1600  vancomycin (VANCOCIN) IVPB 1000 mg/200 mL premix     1,000 mg 200 mL/hr over 60 Minutes Intravenous  Once 09/06/12 1538     09/06/12 1600  ceFEPIme (MAXIPIME) 2  g in dextrose 5 % 50 mL IVPB     2 g 100 mL/hr over 30 Minutes Intravenous  Once 09/06/12 1538     09/05/12 1215  vancomycin (VANCOCIN) IVPB 1000 mg/200 mL premix  Status:  Discontinued     1,000 mg 200 mL/hr over 60 Minutes Intravenous  Once 09/05/12 1205 09/05/12 1217     Assessment: 80 yof presented to the hospital with back pain. She is now started broad-spectrum antibiotics with vancomycin and cefepime for bacteremia and osteomyelitis/discitis. She is currently afebrile and WBC is WNL. She is ESRD on HD TThSa.   Vanc 7/2>> Cefepime 7/2>>  7/1 Blood - GPC in clusters   Goal of Therapy:  Pre-HD vanc level of 15-25  Plan:  1. Vancomycin 1gm now then 500mg  post-HD 2. Cefepime 2gm now then 2gm post-HD 3. F/u renal plans, C&S, clinical status and pre-HD level when appropriate  Fareeda Downard, Drake Leach 09/06/2012,3:40 PM

## 2012-09-06 NOTE — Progress Notes (Signed)
INITIAL NUTRITION ASSESSMENT  DOCUMENTATION CODES Per approved criteria  -Not Applicable   INTERVENTION:  Continue Nepro Carb Steady 2 times daily (425 kcals, 19.1 gm protein per 8 fl oz bottle)  Continue REN-VIT daily RD to follow for nutrition care plan  NUTRITION DIAGNOSIS: Increased nutrient needs related to ESRD as evidenced by estimated nutrient needs   Goal: Oral intake with meals & supplements to meet >/= 90% of estimated nutrition needs  Monitor:  PO & supplemental intake, weight, labs, I/O's  Reason for Assessment: Malnutrition Screening Tool Report  79 y.o. female  Admitting Dx: Osteomyelitis of lumbar spine  ASSESSMENT: Patient with hx of ESRD on TTS dialysis who presented with worsening low back pain and pain radiating into her right leg; MRI showed diskitis/osteo at L2-3 and bilateral psoas abscesses; pain was so severe patient was unable to walk into dialysis and needed a wheelchair.  Patient s/p fluoro guided disc aspiration 7/2.  Patient reports a decreased appetite; patient likes Nepro Carb Steady and has ordered 2 times daily; RN brought into room per patient request upon RD visit; no % meal intake available per flowsheet records; patient's weight down some since January, however, not significant for time frame.  Height: Ht Readings from Last 1 Encounters:  09/05/12 5\' 3"  (1.6 m)    Weight: Wt Readings from Last 1 Encounters:  09/06/12 111 lb 12.4 oz (50.7 kg)    Ideal Body Weight: 115 lb  % Ideal Body Weight: 96%  Wt Readings from Last 10 Encounters:  09/06/12 111 lb 12.4 oz (50.7 kg)  03/28/12 119 lb 14.9 oz (54.4 kg)  06/16/11 127 lb 3.3 oz (57.7 kg)    Usual Body Weight: 119 lb  % Usual Body Weight: 93%  BMI:  Body mass index is 19.8 kg/(m^2).  Estimated Nutritional Needs: Kcal: 1700-1900 Protein: 80-90 gm Fluid: 1200 ml  Skin: Intact  Diet Order: Renal 80/90-04-09-1198 ml  EDUCATION NEEDS: -No education needs identified at  this time   Intake/Output Summary (Last 24 hours) at 09/06/12 1509 Last data filed at 09/06/12 0100  Gross per 24 hour  Intake 124.17 ml  Output   1755 ml  Net -1630.83 ml    Labs:   Recent Labs Lab 09/05/12 0833 09/06/12 0440  NA 135 136  K 4.2 4.4  CL 91* 94*  CO2 25 29  BUN 50* 31*  CREATININE 11.77* 9.16*  CALCIUM 8.9 9.1  GLUCOSE 86 172*    Scheduled Meds: . amLODipine  10 mg Oral QHS  . calcium acetate  1,334 mg Oral TID WC  . docusate sodium  100 mg Oral BID  . [START ON 09/07/2012] doxercalciferol  1 mcg Intravenous Q T,Th,Sa-HD  . feeding supplement (NEPRO CARB STEADY)  237 mL Oral BID BM  . fentaNYL      . [START ON 09/07/2012] heparin  5,000 Units Subcutaneous Q8H  . levothyroxine  150 mcg Oral QAC breakfast  . lisinopril  40 mg Oral QHS  . midazolam      . multivitamin  1 tablet Oral QHS  . polyethylene glycol  17 g Oral Daily  . sodium chloride  3 mL Intravenous Q12H    Continuous Infusions:   Past Medical History  Diagnosis Date  . Hypertension   . ESRD (end stage renal disease)   . Peripheral vascular disease     revascularization appx. 14 years ago  . Hypothyroidism   . Hiatal hernia   . Iron deficiency anemia   .  Osteoarthritis   . Depression with anxiety   . Pulmonary embolism   . AAA (abdominal aortic aneurysm) 1999    aortic byfem bypass    Past Surgical History  Procedure Laterality Date  . Abdominal surgery  about 14 years ago    REVASCULARIZATION  . Ateriovenous graft  04/18/2009    LEFT FOREARM avg  . Av fistula placement, radiocephalic  01/09/10  . Avgg removal  12/02/09    SECONDARY TO INFECTION LT FOREARM  . Av fistula placement, radiocephalic  05/08/10    NONMATURED  . Arteriovenous graft placement  07/24/10    RIGHT FOREARM LOOP  . Dg av dialysis graft declot or  12/05/10, 6/17/123, 09/24/11, 01/26/12    CLOTTED GRAFT  . Angioplasty  12/21/11    of RUE AVG    Maureen Chatters, RD, LDN Pager #: [phone] After-Hours  Pager #: [phone]

## 2012-09-06 NOTE — Progress Notes (Signed)
Patient ID: Nicole [name], female   DOB: Oct 03, 1932, 79 y.o.   MRN: [id number]         [facility]    Date of Admission:  09/05/2012           Day 1 vancomycin        Day 1 cefepime  Principal Problem:   Osteomyelitis of lumbar spine Active Problems:   Discitis of lumbar region   Psoas abscess   Hypertension   ESRD (end stage renal disease) on dialysis   PVD (peripheral vascular disease)   Hypothyroidism   Anemia in chronic kidney disease(285.21)   Subjective: No change in her lower back pain  Objective: Temp:  [97.5 F (36.4 C)-98.5 F (36.9 C)] 98.4 F (36.9 C) (07/02 1100) Pulse Rate:  [51-83] 69 (07/02 1609) Resp:  [14-24] 22 (07/02 1430) BP: (110-216)/(45-85) 130/64 mmHg (07/02 1609) SpO2:  [96 %-100 %] 97 % (07/02 1609) Weight:  [49.9 kg (110 lb 0.2 oz)-50.7 kg (111 lb 12.4 oz)] 50.7 kg (111 lb 12.4 oz) (07/02 0500)  General: She appears uncomfortable Skin: The sclera or conjunctival hemorrhages. Right arm dialysis graft access site appears normal Lungs: Clear Cor: Regular S1 and S2 and no murmurs  Lab Results Lab Results  Component Value Date   WBC 4.4 09/06/2012   HGB 12.5 09/06/2012   HCT 38.5 09/06/2012   MCV 103.8* 09/06/2012   PLT 314 09/06/2012    Lab Results  Component Value Date   CREATININE 9.16* 09/06/2012   BUN 31* 09/06/2012   NA 136 09/06/2012   K 4.4 09/06/2012   CL 94* 09/06/2012   CO2 29 09/06/2012    Microbiology: Recent Results (from the past 240 hour(s))  CULTURE, BLOOD (ROUTINE X 2)     Status: None   Collection Time    09/05/12  1:00 PM      Result Value Range Status   Specimen Description BLOOD HAND LEFT   Final   Special Requests BOTTLES DRAWN AEROBIC AND ANAEROBIC 4CC   Final   Culture  Setup Time 09/05/2012 16:21   Final   Culture     Final   Value: GRAM POSITIVE COCCI IN CLUSTERS     Note: Gram Stain Report Called to,Read Back By and Verified With: Marney Setting @ [phone] 09/06/12 BY KRAWS   Report Status PENDING    Incomplete  MRSA PCR SCREENING     Status: None   Collection Time    09/05/12  9:48 PM      Result Value Range Status   MRSA by PCR NEGATIVE  NEGATIVE Final   Comment:            The GeneXpert MRSA Assay (FDA     approved for NASAL specimens     only), is one component of a     comprehensive MRSA colonization     surveillance program. It is not     intended to diagnose MRSA     infection nor to guide or     monitor treatment for     MRSA infections.  GRAM STAIN     Status: None   Collection Time    09/06/12  2:32 PM      Result Value Range Status   Specimen Description ABSCESS BACK   Final   Special Requests DISC SPACE L 2 3   Final   Gram Stain     Final   Value: ABUNDANT WBC PRESENT,BOTH PMN AND  MONONUCLEAR     NO ORGANISMS SEEN   Report Status 09/06/2012 FINAL   Final   Assessment: She underwent lumbar aspirate earlier today. So far, there is no official report from interventional radiology as to how much fluid was aspirated or what it looked like. There are no organisms seen on Gram stain. One of 2 sets of blood cultures from admission are growing gram-positive cocci in clusters. This could represent staph bacteremia which would be a source for her lumbar infection or could simply be an insignificant skin contaminant. We will need to wait on final blood culture results and lumbar aspirate cultures to sort this out. I agree with empiric vancomycin and cefepime at this time.  Plan: 1. Continue vancomycin and cefepime pending final culture results  Cliffton Asters, MD [facility] [phone] pager   [phone] cell 09/06/2012, 4:54 PM

## 2012-09-07 DIAGNOSIS — I120 Hypertensive chronic kidney disease with stage 5 chronic kidney disease or end stage renal disease: Secondary | ICD-10-CM

## 2012-09-07 DIAGNOSIS — D631 Anemia in chronic kidney disease: Secondary | ICD-10-CM

## 2012-09-07 DIAGNOSIS — M549 Dorsalgia, unspecified: Secondary | ICD-10-CM

## 2012-09-07 DIAGNOSIS — M519 Unspecified thoracic, thoracolumbar and lumbosacral intervertebral disc disorder: Secondary | ICD-10-CM

## 2012-09-07 DIAGNOSIS — M869 Osteomyelitis, unspecified: Secondary | ICD-10-CM

## 2012-09-07 DIAGNOSIS — E039 Hypothyroidism, unspecified: Secondary | ICD-10-CM

## 2012-09-07 DIAGNOSIS — N186 End stage renal disease: Secondary | ICD-10-CM

## 2012-09-07 LAB — BODY FLUID CELL COUNT WITH DIFFERENTIAL
Eos, Fluid: 0 %
Lymphs, Fluid: 0 %
Monocyte-Macrophage-Serous Fluid: 6 % — ABNORMAL LOW (ref 50–90)
Neutrophil Count, Fluid: 94 % — ABNORMAL HIGH (ref 0–25)

## 2012-09-07 LAB — CULTURE, BLOOD (ROUTINE X 2)

## 2012-09-07 MED ORDER — ONDANSETRON HCL 4 MG PO TABS: 4.0000 mg | ORAL_TABLET | Freq: Three times a day (TID) | ORAL | Status: DC | PRN

## 2012-09-07 MED ORDER — HYDROCODONE-ACETAMINOPHEN 5-325 MG PO TABS: 1.0000 | ORAL_TABLET | ORAL | Status: DC | PRN

## 2012-09-07 MED ORDER — HYDROMORPHONE HCL PF 1 MG/ML IJ SOLN
1.0000 mg | INTRAMUSCULAR | Status: DC | PRN
  Administered 2012-09-07 – 2012-09-08 (×4): 1 mg via INTRAVENOUS
  Filled 2012-09-07 (×6): qty 1

## 2012-09-07 NOTE — Progress Notes (Signed)
Patient ID: Nicole [name], female   DOB: 1932/03/18, 79 y.o.   MRN: [id number]         [facility]    Date of Admission:  09/05/2012           Day 2 vancomycin        Day 2 cefepime  Principal Problem:   Osteomyelitis of lumbar spine Active Problems:   Discitis of lumbar region   Psoas abscess   Hypertension   ESRD (end stage renal disease) on dialysis   PVD (peripheral vascular disease)   Hypothyroidism   Anemia in chronic kidney disease(285.21)    Subjective: She says that she is feeling better but still complains of severe back pain and rates it 7/10.  Objective: Temp:  [98 F (36.7 C)-98.4 F (36.9 C)] 98 F (36.7 C) (07/03 0839) Pulse Rate:  [51-78] 62 (07/03 0839) Resp:  [17-24] 18 (07/03 0839) BP: (130-216)/(55-95) 153/55 mmHg (07/03 0839) SpO2:  [93 %-100 %] 98 % (07/03 0839) Weight:  [50.6 kg (111 lb 8.8 oz)] 50.6 kg (111 lb 8.8 oz) (07/02 2031)  General: She appears quite uncomfortable due to pain Lungs: Clear Cor: Regular S1 and S2 with no murmurs  Lab Results Lab Results  Component Value Date   WBC 4.4 09/06/2012   HGB 12.5 09/06/2012   HCT 38.5 09/06/2012   MCV 103.8* 09/06/2012   PLT 314 09/06/2012    Lab Results  Component Value Date   CREATININE 9.16* 09/06/2012   BUN 31* 09/06/2012   NA 136 09/06/2012   K 4.4 09/06/2012   CL 94* 09/06/2012   CO2 29 09/06/2012    Lab Results  Component Value Date   ALT 5 03/20/2012   AST 12 03/20/2012   ALKPHOS 72 03/20/2012   BILITOT 0.3 03/20/2012      Microbiology: Recent Results (from the past 240 hour(s))  CULTURE, BLOOD (ROUTINE X 2)     Status: None   Collection Time    09/05/12  1:00 PM      Result Value Range Status   Specimen Description BLOOD HAND LEFT   Final   Special Requests BOTTLES DRAWN AEROBIC AND ANAEROBIC 4CC   Final   Culture  Setup Time 09/05/2012 16:21   Final   Culture     Final   Value: STAPHYLOCOCCUS SPECIES (COAGULASE NEGATIVE)     Note: THE SIGNIFICANCE OF  ISOLATING THIS ORGANISM FROM A SINGLE SET OF BLOOD CULTURES WHEN MULTIPLE SETS ARE DRAWN IS UNCERTAIN. PLEASE NOTIFY THE MICROBIOLOGY DEPARTMENT WITHIN ONE WEEK IF SPECIATION AND SENSITIVITIES ARE REQUIRED.     Note: Gram Stain Report Called to,Read Back By and Verified With: ANDREW Delford Field @ [phone] 09/06/12 BY KRAWS   Report Status 09/07/2012 FINAL   Final  CULTURE, BLOOD (ROUTINE X 2)     Status: None   Collection Time    09/05/12  4:45 PM      Result Value Range Status   Specimen Description BLOOD HEMODIALYSIS LINE   Final   Special Requests BOTTLES DRAWN AEROBIC AND ANAEROBIC 10CC   Final   Culture  Setup Time 09/06/2012 00:16   Final   Culture     Final   Value:        BLOOD CULTURE RECEIVED NO GROWTH TO DATE CULTURE WILL BE HELD FOR 5 DAYS BEFORE ISSUING A FINAL NEGATIVE REPORT   Report Status PENDING   Incomplete  MRSA PCR SCREENING     Status: None  Collection Time    09/05/12  9:48 PM      Result Value Range Status   MRSA by PCR NEGATIVE  NEGATIVE Final   Comment:            The GeneXpert MRSA Assay (FDA     approved for NASAL specimens     only), is one component of a     comprehensive MRSA colonization     surveillance program. It is not     intended to diagnose MRSA     infection nor to guide or     monitor treatment for     MRSA infections.  GRAM STAIN     Status: None   Collection Time    09/06/12  2:32 PM      Result Value Range Status   Specimen Description ABSCESS BACK   Final   Special Requests DISC SPACE L 2 3   Final   Gram Stain     Final   Value: ABUNDANT WBC PRESENT,BOTH PMN AND MONONUCLEAR     NO ORGANISMS SEEN   Report Status 09/06/2012 FINAL   Final  CULTURE, ROUTINE-ABSCESS     Status: None   Collection Time    09/06/12  2:32 PM      Result Value Range Status   Specimen Description ABSCESS BACK   Final   Special Requests L 2 3 SPACE   Final   Gram Stain     Final   Value: ABUNDANT WBC PRESENT,BOTH PMN AND MONONUCLEAR     NO SQUAMOUS EPITHELIAL  CELLS SEEN     NO ORGANISMS SEEN     Performed at [facility]   Culture NO GROWTH 1 DAY   Final   Report Status PENDING   Incomplete   Assessment: The coag negative staph in one blood cultures probably an insignificant contaminant. So far her lumbar aspirate cultures are negative. I would continue current empiric antibiotic therapy.  Plan: 1. Continue vancomycin and cefepime pending final cultures.  Cliffton Asters, MD [facility] [phone] pager   [phone] cell 09/07/2012, 9:59 AM

## 2012-09-07 NOTE — Progress Notes (Signed)
Subjective:  Back pain sl better/still having leg discomforrt Objective Vital signs in last 24 hours: Filed Vitals:   09/06/12 1800 09/06/12 2031 09/07/12 0522 09/07/12 0839  BP: 173/95 190/69 147/59 153/55  Pulse: 74 51 59 62  Temp:  98 F (36.7 C) 98.4 F (36.9 C) 98 F (36.7 C)  TempSrc:  Oral Oral   Resp: 17 18 20 18   Height:  5\' 3"  (1.6 m)    Weight:  50.6 kg (111 lb 8.8 oz)    SpO2: 96% 93% 99% 98%   Weight change: 0.7 kg (1 lb 8.7 oz)  Intake/Output Summary (Last 24 hours) at 09/07/12 1201 Last data filed at 09/07/12 1000  Gross per 24 hour  Intake    360 ml  Output      0 ml  Net    360 ml   Labs: Basic Metabolic Panel:  Recent Labs Lab 09/05/12 0833 09/06/12 0440  NA 135 136  K 4.2 4.4  CL 91* 94*  CO2 25 29  GLUCOSE 86 172*  BUN 50* 31*  CREATININE 11.77* 9.16*  CALCIUM 8.9 9.1   Liver Function Tests: No results found for this basename: AST, ALT, ALKPHOS, BILITOT, PROT, ALBUMIN,  in the last 168 hours No results found for this basename: LIPASE, AMYLASE,  in the last 168 hours No results found for this basename: AMMONIA,  in the last 168 hours CBC:  Recent Labs Lab 09/05/12 0833 09/06/12 0440  WBC 5.9 4.4  NEUTROABS 3.2  --   HGB 12.4 12.5  HCT 37.2 38.5  MCV 102.2* 103.8*  PLT 352 314   Cardiac Enzymes: No results found for this basename: CKTOTAL, CKMB, CKMBINDEX, TROPONINI,  in the last 168 hours CBG: No results found for this basename: GLUCAP,  in the last 168 hours  Iron Studies: No results found for this basename: IRON, TIBC, TRANSFERRIN, FERRITIN,  in the last 72 hours Studies/Results: No results found. Medications:   . amLODipine  10 mg Oral QHS  . calcium acetate  1,334 mg Oral TID WC  . ceFEPime (MAXIPIME) IV  2 g Intravenous Q T,Th,Sat-1800  . docusate sodium  100 mg Oral BID  . doxercalciferol  1 mcg Intravenous Q T,Th,Sa-HD  . feeding supplement (NEPRO CARB STEADY)  237 mL Oral BID BM  . heparin  5,000 Units Subcutaneous  Q8H  . levothyroxine  150 mcg Oral QAC breakfast  . lisinopril  40 mg Oral QHS  . multivitamin  1 tablet Oral QHS  . polyethylene glycol  17 g Oral Daily  . sodium chloride  3 mL Intravenous Q12H  . vancomycin  500 mg Intravenous Q T,Th,Sa-HD   Physical Exam: General: Alert. NAD Heart: RRR, No rub or mur Lungs: CTA bilat Abdomen: Bs pos. , soft , nontender Extremities: Dialysis Access: No pedal edema/ pos. bruit Avgg R lower arm  Dialysis Orders ([facility] TTS)  4hr   F160   Heparin 6000    2.25Ca    EDW 52kg   RL AVGG  Hectorol 1ug, no Epo - last dose 9000 6/14, venofer 50/week (doesn't need)  Labs: Hgb 13.3 Feb109 and 61%sat 6/28, Ca and P ok - iPTH 196 in April   Assess/Plan  1 Lumbar diskitis/osteo, psoas abscess= recurrent, sp aspirate yesterday on vanc / cefipime 2 ESRD= cont hd TTS  3 HTN, below EDW but bp high > cont BB, ACEi, dec vol with hd 4 Anemia, Hb 12, no epo  5 Sec HPT, cont vit  D, binder  6 HypoT4- synthroid ^'d 150 mcg Jan '14, compliance questionable  7 Bradycardia - improved, afib  8 Hx pe  9 PVD   Lenny Pastel, PA-C [facility] [phone] 09/07/2012,12:01 PM  LOS: 2 days   Patient seen and examined.  Agree with assessment and plan as above. Vinson Moselle  MD Pager [phone]    Cell  [phone] 09/07/2012, 1:09 PM

## 2012-09-07 NOTE — Progress Notes (Signed)
Subjective: Patient reports that she feels a little weak today, does not want to do dialysis.  Says that the needles hurt to much.  Patient was told that it is important to go to dialysis on schedule.  Currently in 6/10 pain. Afebrile.  Mild Nausea, no emesis. Objective: Vital signs in last 24 hours: Filed Vitals:   09/07/12 0839 09/07/12 1315 09/07/12 1558 09/07/12 1601  BP: 153/55 165/61  168/51  Pulse: 62 100  59  Temp: 98 F (36.7 C) 98 F (36.7 C)  98.6 F (37 C)  TempSrc:    Oral  Resp: 18 18  16   Height:      Weight:      SpO2: 98% 100% 97%    Weight change: 1 lb 8.7 oz (0.7 kg)  Intake/Output Summary (Last 24 hours) at 09/07/12 1832 Last data filed at 09/07/12 1316  Gross per 24 hour  Intake    360 ml  Output      0 ml  Net    360 ml   General: resting in bed  Cardiac: RRR, no rubs, murmurs or gallops  Pulm: clear to auscultation bilaterally  Abd: soft, nontender, nondistended, BS normalactive  Ext: warm and well perfused, no pedal edema.  Neuro: alert and oriented X3,  Dialysis site: RUE, +bruit, no errythmia. Lab Results: Basic Metabolic Panel:  Recent Labs Lab 09/05/12 0833 09/06/12 0440  NA 135 136  K 4.2 4.4  CL 91* 94*  CO2 25 29  GLUCOSE 86 172*  BUN 50* 31*  CREATININE 11.77* 9.16*  CALCIUM 8.9 9.1   CBC:  Recent Labs Lab 09/05/12 0833 09/06/12 0440  WBC 5.9 4.4  NEUTROABS 3.2  --   HGB 12.4 12.5  HCT 37.2 38.5  MCV 102.2* 103.8*  PLT 352 314   Thyroid Function Tests:  Recent Labs Lab 09/05/12 1524  TSH 114.710*   Coagulation:  Recent Labs Lab 09/06/12 0440  LABPROT 12.7  INR 0.97    Micro Results: Recent Results (from the past 240 hour(s))  CULTURE, BLOOD (ROUTINE X 2)     Status: None   Collection Time    09/05/12  1:00 PM      Result Value Range Status   Specimen Description BLOOD HAND LEFT   Final   Special Requests BOTTLES DRAWN AEROBIC AND ANAEROBIC 4CC   Final   Culture  Setup Time 09/05/2012 16:21    Final   Culture     Final   Value: STAPHYLOCOCCUS SPECIES (COAGULASE NEGATIVE)     Note: THE SIGNIFICANCE OF ISOLATING THIS ORGANISM FROM A SINGLE SET OF BLOOD CULTURES WHEN MULTIPLE SETS ARE DRAWN IS UNCERTAIN. PLEASE NOTIFY THE MICROBIOLOGY DEPARTMENT WITHIN ONE WEEK IF SPECIATION AND SENSITIVITIES ARE REQUIRED.     Note: Gram Stain Report Called to,Read Back By and Verified With: ANDREW Delford Field @ [phone] 09/06/12 BY KRAWS   Report Status 09/07/2012 FINAL   Final  CULTURE, BLOOD (ROUTINE X 2)     Status: None   Collection Time    09/05/12  4:45 PM      Result Value Range Status   Specimen Description BLOOD HEMODIALYSIS LINE   Final   Special Requests BOTTLES DRAWN AEROBIC AND ANAEROBIC 10CC   Final   Culture  Setup Time 09/06/2012 00:16   Final   Culture     Final   Value:        BLOOD CULTURE RECEIVED NO GROWTH TO DATE CULTURE WILL BE HELD FOR 5  DAYS BEFORE ISSUING A FINAL NEGATIVE REPORT   Report Status PENDING   Incomplete  MRSA PCR SCREENING     Status: None   Collection Time    09/05/12  9:48 PM      Result Value Range Status   MRSA by PCR NEGATIVE  NEGATIVE Final   Comment:            The GeneXpert MRSA Assay (FDA     approved for NASAL specimens     only), is one component of a     comprehensive MRSA colonization     surveillance program. It is not     intended to diagnose MRSA     infection nor to guide or     monitor treatment for     MRSA infections.  GRAM STAIN     Status: None   Collection Time    09/06/12  2:32 PM      Result Value Range Status   Specimen Description ABSCESS BACK   Final   Special Requests DISC SPACE L 2 3   Final   Gram Stain     Final   Value: ABUNDANT WBC PRESENT,BOTH PMN AND MONONUCLEAR     NO ORGANISMS SEEN   Report Status 09/06/2012 FINAL   Final  CULTURE, ROUTINE-ABSCESS     Status: None   Collection Time    09/06/12  2:32 PM      Result Value Range Status   Specimen Description ABSCESS BACK   Final   Special Requests L 2 3 SPACE   Final     Gram Stain     Final   Value: ABUNDANT WBC PRESENT,BOTH PMN AND MONONUCLEAR     NO SQUAMOUS EPITHELIAL CELLS SEEN     NO ORGANISMS SEEN     Performed at [facility]   Culture NO GROWTH 1 DAY   Final   Report Status PENDING   Incomplete   Studies/Results: No results found. Medications: I have reviewed the patient's current medications. Scheduled Meds: . amLODipine  10 mg Oral QHS  . calcium acetate  1,334 mg Oral TID WC  . ceFEPime (MAXIPIME) IV  2 g Intravenous Q T,Th,Sat-1800  . docusate sodium  100 mg Oral BID  . doxercalciferol  1 mcg Intravenous Q T,Th,Sa-HD  . feeding supplement (NEPRO CARB STEADY)  237 mL Oral BID BM  . heparin  5,000 Units Subcutaneous Q8H  . levothyroxine  150 mcg Oral QAC breakfast  . lisinopril  40 mg Oral QHS  . multivitamin  1 tablet Oral QHS  . polyethylene glycol  17 g Oral Daily  . sodium chloride  3 mL Intravenous Q12H  . vancomycin  500 mg Intravenous Q T,Th,Sa-HD   Continuous Infusions:  PRN Meds:.HYDROmorphone (DILAUDID) injection, ondansetron Assessment/Plan:  Osteomyelitis/Discitis of lumbar spine with B/L Psoas abscess  -MRI revealed progressive changes consistent with recurrent diskitis and osteomyelitis with b/l psoas abscess. No epidural abscess seen on MRI. She was diagnosed with Lumbar discitis and osteomyelitis in January 2014 but aspirated culture showed no growth and she was treated with 6 weeks of Vanc and [place].  -Patient with ESRD on dialysis, port is likely source of bacteremia to cause osteomyelitis  -Initial Blood culture obtained 09/05/12 1/2 sets has + gram stain showing coag neg negative staph  -CT Aspiration completed NGTD from cultures - Vanc and Cefepime started 7/2 per pharmacy. Will narrow Abx coverage after speciation and sensitivities are obtained.  -Increase Dilaudid 1mg  Q4 PRN. -  PT/OT eval and treat  -CRP 3.7, Sed rate 54. Will monitor for effectiveness of Abx treatment.  Active Problems:  ESRD (end  stage renal disease) on dialysis  -Normal Dialysis schedule is T, TH, S. (She follows with Dr. Geanie Berlin, HD at [facility]) Cont normal schedule.  -Did not feel well for dialysis today, moved to tomorrow. Hypothyroidism  -Continue home synthroid dose of .  -TSH on 09/05/12 is 114. Unlikely that patient is compliant with home medication.  Anemia in chronic kidney disease(285.21)  -Patient with ESRD, normally hemoglobin runs 8-9 currently hemoglobin is 12.4 before HD. Will continue to monitor.  -Hgb 7/2 was 12.5, no need for EPO  Severe spinal stenosis at L2-3  -Unchanged compared to previous MRI.  Accelerated Hypertension resolved  -BP decreased after HD. Remains elevated, home HTN meds resumed.  -Continue to monitor.  PVD (peripheral vascular disease)  -No ulcers noted.  Bradycardia-resolved  #Code Status: full code  Dispo: Disposition is deferred at this time, awaiting improvement of current medical problems. Anticipated discharge in approximately 1-2 day(s).  The patient does have a current PCP [name], MD) and does not need an [facility] hospital follow-up appointment after discharge.  The patient does not have transportation limitations that hinder transportation to clinic appointments.   .Services Needed at time of discharge: Y = Yes, Blank = No PT:   OT:   RN:   Equipment:   Other:     LOS: 2 days   Carlynn Purl, DO 09/07/2012, 6:32 PM

## 2012-09-07 NOTE — Progress Notes (Signed)
  Date: 09/07/2012  Patient name: Nicole [name]  Medical record number: [id number]  Date of birth: 1932-04-17   This patient has been seen and the plan of care was discussed with the house staff. Please see their note for complete details. I concur with their findings with the following additions/corrections:  BC with coag neg staph. S/P lumbar aspiration of fluid. Lumbar cultures currently NG. On Vanc and Cefepime for now pending final cultures. Pain is being adequately treated.  She has hypothyroidism and has a TSH of 114!! On levothyroxine, likely non adherent to therapy at home.    Jonah Blue, DO 09/07/2012, 1:55 PM

## 2012-09-07 NOTE — Evaluation (Signed)
Occupational Therapy Evaluation Patient Details Name: Nicole [name] MRN: [id number] DOB: 12-13-1932 Today's Date: 09/07/2012 Time: 2130-8657 OT Time Calculation (min): 35 min  OT Assessment / Plan / Recommendation History of present illness Nicole [name] is a 79 year old female with a PMH significant for ESRD, PVD, Hypothyroidism, Iron deficiency anemia, depression, and AAA.  She presented to [facility] ED for lower back pain for the past 3 days.  She reports the pain is sharp, constant, worsening, 10/10, radiates down the front of right leg.  The pain in back/legs feels like never before (worse than in January); she did not try any medications to help with pain. Denies fever/chills.  Found to have Osteomyelitis of lumbar spine.   Clinical Impression   At this time pt is really limited by pain. Feel she may need a short SNF stay prior to D/C home. Will benefit from acute OT as pain is better under control. Will continue to follow.    OT Assessment  Patient needs continued OT Services    Follow Up Recommendations  SNF       Equipment Recommendations  3 in 1 bedside comode       Frequency  Min 2X/week    Precautions / Restrictions Precautions Precautions: Fall Precaution Comments: stated she was dizzy when she stood up Restrictions Weight Bearing Restrictions: No   Pertinent Vitals/Pain 5/10 back; RN made aware and gave IV meds; post ambulation pt reports 7/10 pain in back    ADL  Eating/Feeding: Performed;Set up Where Assessed - Eating/Feeding: Chair Grooming: Simulated;Set up;Supervision/safety Where Assessed - Grooming: Supported sitting Upper Body Bathing: Simulated;Set up;Supervision/safety Where Assessed - Upper Body Bathing: Supported sitting Lower Body Bathing: Simulated;Maximal assistance Where Assessed - Lower Body Bathing: Supported sit to stand Upper Body Dressing: Simulated;Maximal assistance Where Assessed - Upper Body Dressing: Supported sitting Lower Body Dressing:  Simulated;+1 Total assistance Where Assessed - Lower Body Dressing: Supported sit to Pharmacist, hospital: Simulated;Moderate assistance Toilet Transfer Method: Sit to Barista:  (Bed>recliner next to bed going to patient's right) Toileting - Architect and Hygiene: Simulated;+1 Total assistance Where Assessed - Engineer, mining and Hygiene: Standing Equipment Used: Rolling walker;Gait belt Transfers/Ambulation Related to ADLs: Mod A sit to stand and stand to sit    OT Diagnosis: Generalized weakness;Acute pain  OT Problem List: Decreased strength;Decreased activity tolerance;Impaired balance (sitting and/or standing);Pain;Decreased knowledge of use of DME or AE OT Treatment Interventions: Self-care/ADL training;Balance training;Patient/family education;DME and/or AE instruction;Therapeutic activities   OT Goals(Current goals can be found in the care plan section) Acute Rehab OT Goals OT Goal Formulation: With patient Time For Goal Achievement: 09/21/12 Potential to Achieve Goals: Good ADL Goals Pt Will Perform Grooming: with min assist;standing (1 task at sink) Pt Will Perform Lower Body Bathing: with mod assist;sit to/from stand Pt Will Perform Lower Body Dressing: with mod assist;sit to/from stand Pt Will Transfer to Toilet: with min assist;ambulating;bedside commode Additional ADL Goal #1: Pt will be able to come up to sit EOB with HOB flat and no rail with S.  Visit Information  Last OT Received On: 09/07/12 Assistance Needed: +2 History of Present Illness: Nicole [name] is a 79 year old female with a PMH significant for ESRD, PVD, Hypothyroidism, Iron deficiency anemia, depression, and AAA.  She presented to [facility] ED for lower back pain for the past 3 days.  She reports the pain is sharp, constant, worsening, 10/10, radiates down the front of right leg.  The pain in  back/legs feels like never before (worse than in January); she did not  try any medications to help with pain. Denies fever/chills.  Found to have Osteomyelitis of lumbar spine.       Prior Functioning     Home Living Family/patient expects to be discharged to:: Private residence Living Arrangements: Children Available Help at Discharge: Family;Available 24 hours/day Type of Home: House Home Access: Stairs to enter [employer] of Steps: 1 Entrance Stairs-Rails: None Home Layout: Two level;Bed/bath upstairs Alternate Level Stairs-Number of Steps: 12 Alternate Level Stairs-Rails: Right;Left;Can reach both Home Equipment: Cane - single point Additional Comments: only uses cane when she goes out.  She reports to OT that son works days, and dtr in Social worker works nights, and she has essentially 24 hour assist.  Prior Function Level of Independence: Independent with assistive device(s) Comments: Pt reports she sponge bathes Communication Communication: No difficulties Dominant Hand: Right         Vision/Perception Vision - History Patient Visual Report: No change from baseline   Cognition  Cognition Arousal/Alertness: Awake/alert Behavior During Therapy: WFL for tasks assessed/performed Overall Cognitive Status: Within Functional Limits for tasks assessed    Extremity/Trunk Assessment Upper Extremity Assessment Upper Extremity Assessment: Overall WFL for tasks assessed     Mobility Bed Mobility Bed Mobility: Right Sidelying to Sit;Sitting - Scoot to Edge of Bed Right Sidelying to Sit: 4: Min assist;With rails;HOB elevated Sitting - Scoot to [employer] of Bed: 4: Min guard Transfers Transfers: Sit to Stand;Stand to Sit Sit to Stand: 3: Mod assist;With upper extremity assist;From bed Stand to Sit: 3: Mod assist;With upper extremity assist;With armrests;To chair/3-in-1 Details for Transfer Assistance: VCs for safe hand placement           End of Session OT - End of Session Equipment Utilized During Treatment: Gait belt;Rolling  walker Activity Tolerance: Patient limited by fatigue;Patient limited by pain Patient left: in chair;with call bell/phone within reach Nurse Communication: Mobility status       Evette Georges [phone] 09/07/2012, 4:51 PM

## 2012-09-07 NOTE — Care Management Note (Signed)
    Page 1 of 1   09/07/2012     10:57:04 AM   CARE MANAGEMENT NOTE 09/07/2012  Patient:  Nicole [name], Nicole [name]   Account Number:  [id number]  Date Initiated:  09/07/2012  Documentation initiated by:  Fransico Michael  Subjective/Objective Assessment:   from home with children.     Action/Plan:   active with [facility] in 2011   Anticipated DC Date:     Anticipated DC Plan:           Choice offered to / List presented to:             Status of service:   Medicare Important Message given?   (If response is "NO", the following Medicare IM given date fields will be blank) Date Medicare IM given:   Date Additional Medicare IM given:    Discharge Disposition:    Per UR Regulation:    If discussed at Long Length of Stay Meetings, dates discussed:    Comments:  PCP- Nicole Cords, MD Contact- Nicole [name] (son) [phone]  09/07/12- 1047- J.Lutricia Horsfall [phone]      79yo female admitted on 09/05/12 with c/o severe back pain. MRI revealed progressive changes consistent with recurrent diskitis and osteomyelitis with b/l psoas abscess. Patient was hospitalized in Jan 2014 with diskitis and discharged to [facility] for IV antibiotics. CM spoke with Dr. Mikey Bussing via telephone and requested PT/OT eval orders be entered on patient. Verbalized agreement. CM will continue to follow for needs and disposition.

## 2012-09-07 NOTE — Progress Notes (Signed)
Call placed to HD unit, verified that patient HD has been moved to tomorrow. Will cont to monitor.

## 2012-09-08 DIAGNOSIS — B957 Other staphylococcus as the cause of diseases classified elsewhere: Secondary | ICD-10-CM

## 2012-09-08 DIAGNOSIS — Z1159 Encounter for screening for other viral diseases: Secondary | ICD-10-CM

## 2012-09-08 LAB — CBC
HCT: 34.3 % — ABNORMAL LOW (ref 36.0–46.0)
MCH: 34.2 pg — ABNORMAL HIGH (ref 26.0–34.0)
MCV: 99.4 fL (ref 78.0–100.0)
RDW: 15 % (ref 11.5–15.5)
WBC: 5.7 10*3/uL (ref 4.0–10.5)

## 2012-09-08 LAB — RENAL FUNCTION PANEL
Albumin: 2.8 g/dL — ABNORMAL LOW (ref 3.5–5.2)
BUN: 48 mg/dL — ABNORMAL HIGH (ref 6–23)
Chloride: 94 mEq/L — ABNORMAL LOW (ref 96–112)
Creatinine, Ser: 12.67 mg/dL — ABNORMAL HIGH (ref 0.50–1.10)

## 2012-09-08 MED ORDER — LIDOCAINE HCL (PF) 1 % IJ SOLN: 5.0000 mL | INTRAMUSCULAR | Status: DC | PRN

## 2012-09-08 MED ORDER — SODIUM CHLORIDE 0.9 % IV SOLN: 100.0000 mL | INTRAVENOUS | Status: DC | PRN

## 2012-09-08 MED ORDER — LIDOCAINE-PRILOCAINE 2.5-2.5 % EX CREA: 1.0000 "application " | TOPICAL_CREAM | CUTANEOUS | Status: DC | PRN

## 2012-09-08 MED ORDER — PENTAFLUOROPROP-TETRAFLUOROETH EX AERO: 1.0000 "application " | INHALATION_SPRAY | CUTANEOUS | Status: DC | PRN

## 2012-09-08 MED ORDER — NEPRO/CARBSTEADY PO LIQD: 237.0000 mL | ORAL | Status: DC | PRN

## 2012-09-08 MED ORDER — DOXERCALCIFEROL 4 MCG/2ML IV SOLN
INTRAVENOUS | Status: AC
  Administered 2012-09-08: 1 ug via INTRAVENOUS
  Filled 2012-09-08: qty 2

## 2012-09-08 MED ORDER — HEPARIN SODIUM (PORCINE) 1000 UNIT/ML DIALYSIS
1000.0000 [IU] | INTRAMUSCULAR | Status: DC | PRN
  Administered 2012-09-09: 1000 [IU] via INTRAVENOUS_CENTRAL

## 2012-09-08 MED ORDER — OXYCODONE-ACETAMINOPHEN 5-325 MG PO TABS
2.0000 | ORAL_TABLET | Freq: Once | ORAL | Status: AC
  Administered 2012-09-08: 2 via ORAL
  Filled 2012-09-08: qty 2

## 2012-09-08 MED ORDER — ALTEPLASE 2 MG IJ SOLR: 2.0000 mg | Freq: Once | INTRAMUSCULAR | Status: AC | PRN

## 2012-09-08 MED ORDER — HEPARIN SODIUM (PORCINE) 1000 UNIT/ML DIALYSIS
2000.0000 [IU] | INTRAMUSCULAR | Status: DC | PRN
  Administered 2012-09-08: 2000 [IU] via INTRAVENOUS_CENTRAL

## 2012-09-08 NOTE — Progress Notes (Signed)
[facility]  Day 3 vancomycin  Day 3 cefepime    Subjective: No new complaints   Antibiotics:  Anti-infectives   Start     Dose/Rate Route Frequency Ordered Stop   09/07/12 1800  ceFEPIme (MAXIPIME) 2 g in dextrose 5 % 50 mL IVPB     2 g 100 mL/hr over 30 Minutes Intravenous Every T-Th-Sa (1800) 09/06/12 1540     09/07/12 1200  vancomycin (VANCOCIN) 500 mg in sodium chloride 0.9 % 100 mL IVPB     500 mg 100 mL/hr over 60 Minutes Intravenous Every T-Th-Sa (Hemodialysis) 09/06/12 1540     09/06/12 1600  vancomycin (VANCOCIN) IVPB 1000 mg/200 mL premix     1,000 mg 200 mL/hr over 60 Minutes Intravenous  Once 09/06/12 1538 09/06/12 1903   09/06/12 1600  ceFEPIme (MAXIPIME) 2 g in dextrose 5 % 50 mL IVPB     2 g 100 mL/hr over 30 Minutes Intravenous  Once 09/06/12 1538 09/06/12 1649   09/05/12 1215  vancomycin (VANCOCIN) IVPB 1000 mg/200 mL premix  Status:  Discontinued     1,000 mg 200 mL/hr over 60 Minutes Intravenous  Once 09/05/12 1205 09/05/12 1217      Medications: Scheduled Meds: . amLODipine  10 mg Oral QHS  . calcium acetate  1,334 mg Oral TID WC  . ceFEPime (MAXIPIME) IV  2 g Intravenous Q T,Th,Sat-1800  . docusate sodium  100 mg Oral BID  . doxercalciferol  1 mcg Intravenous Q T,Th,Sa-HD  . feeding supplement (NEPRO CARB STEADY)  237 mL Oral BID BM  . heparin  5,000 Units Subcutaneous Q8H  . levothyroxine  150 mcg Oral QAC breakfast  . lisinopril  40 mg Oral QHS  . multivitamin  1 tablet Oral QHS  . polyethylene glycol  17 g Oral Daily  . sodium chloride  3 mL Intravenous Q12H  . vancomycin  500 mg Intravenous Q T,Th,Sa-HD   Continuous Infusions:  PRN Meds:.sodium chloride, sodium chloride, alteplase, feeding supplement (NEPRO CARB STEADY), heparin, [START ON 09/09/2012] heparin, HYDROmorphone (DILAUDID) injection, lidocaine (PF), lidocaine-prilocaine, pentafluoroprop-tetrafluoroeth   Objective: Weight change: 2 lb 6.8 oz (1.1  kg)  Intake/Output Summary (Last 24 hours) at 09/08/12 1037 Last data filed at 09/07/12 1851  Gross per 24 hour  Intake      0 ml  Output      0 ml  Net      0 ml   Blood pressure 102/56, pulse 74, temperature 97.6 F (36.4 C), temperature source Oral, resp. rate 18, height 5\' 3"  (1.6 m), weight 110 lb 3.7 oz (50 kg), SpO2 98.00%. Temp:  [97.6 F (36.4 C)-98.6 F (37 C)] 97.6 F (36.4 C) (07/04 0714) Pulse Rate:  [49-100] 74 (07/04 1030) Resp:  [16-20] 18 (07/04 1030) BP: (102-198)/(49-81) 102/56 mmHg (07/04 1030) SpO2:  [97 %-100 %] 98 % (07/04 0714) Weight:  [110 lb 3.7 oz (50 kg)-113 lb 15.7 oz (51.7 kg)] 110 lb 3.7 oz (50 kg) (07/04 1610)  Physical Exam: General: Alert and awake, oriented x3, not in any acute distress. Receiving HD  NEuro: nonfocal  Lab Results:  Recent Labs  09/06/12 0440 09/08/12 0733  WBC 4.4 5.7  HGB 12.5 11.8*  HCT 38.5 34.3*  PLT 314 296    BMET  Recent Labs  09/06/12 0440 09/08/12 0733  NA 136 135  K 4.4 4.1  CL 94* 94*  CO2 29 24  GLUCOSE 172* 107*  BUN 31* 48*  CREATININE 9.16* 12.67*  CALCIUM 9.1 9.3    Micro Results: Recent Results (from the past 240 hour(s))  CULTURE, BLOOD (ROUTINE X 2)     Status: None   Collection Time    09/05/12  1:00 PM      Result Value Range Status   Specimen Description BLOOD HAND LEFT   Final   Special Requests BOTTLES DRAWN AEROBIC AND ANAEROBIC 4CC   Final   Culture  Setup Time 09/05/2012 16:21   Final   Culture     Final   Value: STAPHYLOCOCCUS SPECIES (COAGULASE NEGATIVE)     Note: THE SIGNIFICANCE OF ISOLATING THIS ORGANISM FROM A SINGLE SET OF BLOOD CULTURES WHEN MULTIPLE SETS ARE DRAWN IS UNCERTAIN. PLEASE NOTIFY THE MICROBIOLOGY DEPARTMENT WITHIN ONE WEEK IF SPECIATION AND SENSITIVITIES ARE REQUIRED.     Note: Gram Stain Report Called to,Read Back By and Verified With: Marney Setting @ [phone] 09/06/12 BY KRAWS   Report Status 09/07/2012 FINAL   Final  CULTURE, BLOOD (ROUTINE X 2)      Status: None   Collection Time    09/05/12  4:45 PM      Result Value Range Status   Specimen Description BLOOD HEMODIALYSIS LINE   Final   Special Requests BOTTLES DRAWN AEROBIC AND ANAEROBIC 10CC   Final   Culture  Setup Time 09/06/2012 00:16   Final   Culture     Final   Value:        BLOOD CULTURE RECEIVED NO GROWTH TO DATE CULTURE WILL BE HELD FOR 5 DAYS BEFORE ISSUING A FINAL NEGATIVE REPORT   Report Status PENDING   Incomplete  MRSA PCR SCREENING     Status: None   Collection Time    09/05/12  9:48 PM      Result Value Range Status   MRSA by PCR NEGATIVE  NEGATIVE Final   Comment:            The GeneXpert MRSA Assay (FDA     approved for NASAL specimens     only), is one component of a     comprehensive MRSA colonization     surveillance program. It is not     intended to diagnose MRSA     infection nor to guide or     monitor treatment for     MRSA infections.  GRAM STAIN     Status: None   Collection Time    09/06/12  2:32 PM      Result Value Range Status   Specimen Description ABSCESS BACK   Final   Special Requests DISC SPACE L 2 3   Final   Gram Stain     Final   Value: ABUNDANT WBC PRESENT,BOTH PMN AND MONONUCLEAR     NO ORGANISMS SEEN   Report Status 09/06/2012 FINAL   Final  CULTURE, ROUTINE-ABSCESS     Status: None   Collection Time    09/06/12  2:32 PM      Result Value Range Status   Specimen Description ABSCESS BACK   Final   Special Requests L 2 3 SPACE   Final   Gram Stain     Final   Value: ABUNDANT WBC PRESENT,BOTH PMN AND MONONUCLEAR     NO SQUAMOUS EPITHELIAL CELLS SEEN     NO ORGANISMS SEEN     Performed at [facility]   Culture NO GROWTH 2 DAYS   Final   Report Status PENDING   Incomplete  Studies/Results: No results found.    Assessment/Plan: MAHEK SCHLESINGER is a 79 y.o. female with prior Diskitis Lumbar area in January with negative aspirate now with recurrent diskitis with bilateral psoas abscesses, 1/2 blood cultures from  admission with Coag NEg STaph. Disk cx NGTD.  #1 Recurrent diskitis with psoas abscess:  --Would be helpful if Nephrology know of ANY episodes of bacteremia that have been treated PRIOR to January and since then since this could lead [place] to a pathogen  --I also think it would be worth asking IR to aspirate one or both of the psoas abscesses and sending this for culture as there might be viable organisms in the abscess cavity  --otherwise would continue with vancomycin and cefepime for now  #2 Screening: check HIV   LOS: 3 days   Acey Lav 09/08/2012, 10:37 AM

## 2012-09-08 NOTE — Progress Notes (Signed)
Subjective: Patient seen in HD.  Patient reports that her pain has decreased to 2/10 and increase in pain medication seems to be working better.  PT/OT saw and evaluated but participation limited 2/2 pain.  Her pain medication was increased yesterday and she was encouraged to use pain medication to participate or to have nurse call if pain is not being controlled. She reports decreased appetite, but still eating.  She is a little down because she is in the hospital for the same issues as before.  She requested that we call her son Brietta Manso and update him on her status.  She remains afebrile. Objective: Vital signs in last 24 hours: Filed Vitals:   09/08/12 0735 09/08/12 0809 09/08/12 0830 09/08/12 0900  BP: 181/61 183/68 186/81 182/80  Pulse: 52 51 54 63  Temp:      TempSrc:      Resp: 18 18 16 18   Height:      Weight:      SpO2:       Weight change: 2 lb 6.8 oz (1.1 kg)  Intake/Output Summary (Last 24 hours) at 09/08/12 0909 Last data filed at 09/07/12 1851  Gross per 24 hour  Intake    240 ml  Output      0 ml  Net    240 ml   Vitals Reviewed General: resting in bed, undergoing HD Cardiac: RRR, no rubs, murmurs or gallops  Pulm: clear to auscultation bilaterally  Abd: soft, nontender, nondistended, BS normoactive  Ext: warm and well perfused, no pedal edema.  Neuro: alert and oriented X3 Dialysis site: RUE  Lab Results: Basic Metabolic Panel:  Recent Labs Lab 09/06/12 0440 09/08/12 0733  NA 136 135  K 4.4 4.1  CL 94* 94*  CO2 29 24  GLUCOSE 172* 107*  BUN 31* 48*  CREATININE 9.16* 12.67*  CALCIUM 9.1 9.3  PHOS  --  4.6   Liver Function Tests:  Recent Labs Lab 09/08/12 0733  ALBUMIN 2.8*   CBC:  Recent Labs Lab 09/05/12 0833 09/06/12 0440 09/08/12 0733  WBC 5.9 4.4 5.7  NEUTROABS 3.2  --   --   HGB 12.4 12.5 11.8*  HCT 37.2 38.5 34.3*  MCV 102.2* 103.8* 99.4  PLT 352 314 296   Thyroid Function Tests:  Recent Labs Lab 09/05/12 1524    TSH 114.710*   Coagulation:  Recent Labs Lab 09/06/12 0440  LABPROT 12.7  INR 0.97     Micro Results: Recent Results (from the past 240 hour(s))  CULTURE, BLOOD (ROUTINE X 2)     Status: None   Collection Time    09/05/12  1:00 PM      Result Value Range Status   Specimen Description BLOOD HAND LEFT   Final   Special Requests BOTTLES DRAWN AEROBIC AND ANAEROBIC 4CC   Final   Culture  Setup Time 09/05/2012 16:21   Final   Culture     Final   Value: STAPHYLOCOCCUS SPECIES (COAGULASE NEGATIVE)     Note: THE SIGNIFICANCE OF ISOLATING THIS ORGANISM FROM A SINGLE SET OF BLOOD CULTURES WHEN MULTIPLE SETS ARE DRAWN IS UNCERTAIN. PLEASE NOTIFY THE MICROBIOLOGY DEPARTMENT WITHIN ONE WEEK IF SPECIATION AND SENSITIVITIES ARE REQUIRED.     Note: Gram Stain Report Called to,Read Back By and Verified With: Marney Setting @ [phone] 09/06/12 BY KRAWS   Report Status 09/07/2012 FINAL   Final  CULTURE, BLOOD (ROUTINE X 2)     Status: None   Collection  Time    09/05/12  4:45 PM      Result Value Range Status   Specimen Description BLOOD HEMODIALYSIS LINE   Final   Special Requests BOTTLES DRAWN AEROBIC AND ANAEROBIC 10CC   Final   Culture  Setup Time 09/06/2012 00:16   Final   Culture     Final   Value:        BLOOD CULTURE RECEIVED NO GROWTH TO DATE CULTURE WILL BE HELD FOR 5 DAYS BEFORE ISSUING A FINAL NEGATIVE REPORT   Report Status PENDING   Incomplete  MRSA PCR SCREENING     Status: None   Collection Time    09/05/12  9:48 PM      Result Value Range Status   MRSA by PCR NEGATIVE  NEGATIVE Final   Comment:            The GeneXpert MRSA Assay (FDA     approved for NASAL specimens     only), is one component of a     comprehensive MRSA colonization     surveillance program. It is not     intended to diagnose MRSA     infection nor to guide or     monitor treatment for     MRSA infections.  GRAM STAIN     Status: None   Collection Time    09/06/12  2:32 PM      Result Value Range  Status   Specimen Description ABSCESS BACK   Final   Special Requests DISC SPACE L 2 3   Final   Gram Stain     Final   Value: ABUNDANT WBC PRESENT,BOTH PMN AND MONONUCLEAR     NO ORGANISMS SEEN   Report Status 09/06/2012 FINAL   Final  CULTURE, ROUTINE-ABSCESS     Status: None   Collection Time    09/06/12  2:32 PM      Result Value Range Status   Specimen Description ABSCESS BACK   Final   Special Requests L 2 3 SPACE   Final   Gram Stain     Final   Value: ABUNDANT WBC PRESENT,BOTH PMN AND MONONUCLEAR     NO SQUAMOUS EPITHELIAL CELLS SEEN     NO ORGANISMS SEEN     Performed at [facility]   Culture NO GROWTH 2 DAYS   Final   Report Status PENDING   Incomplete   Studies/Results: No results found. Medications: I have reviewed the patient's current medications. Scheduled Meds: . amLODipine  10 mg Oral QHS  . calcium acetate  1,334 mg Oral TID WC  . ceFEPime (MAXIPIME) IV  2 g Intravenous Q T,Th,Sat-1800  . docusate sodium  100 mg Oral BID  . doxercalciferol  1 mcg Intravenous Q T,Th,Sa-HD  . feeding supplement (NEPRO CARB STEADY)  237 mL Oral BID BM  . heparin  5,000 Units Subcutaneous Q8H  . levothyroxine  150 mcg Oral QAC breakfast  . lisinopril  40 mg Oral QHS  . multivitamin  1 tablet Oral QHS  . polyethylene glycol  17 g Oral Daily  . sodium chloride  3 mL Intravenous Q12H  . vancomycin  500 mg Intravenous Q T,Th,Sa-HD   Continuous Infusions:  PRN Meds:.sodium chloride, sodium chloride, alteplase, feeding supplement (NEPRO CARB STEADY), heparin, [START ON 09/09/2012] heparin, HYDROmorphone (DILAUDID) injection, lidocaine (PF), lidocaine-prilocaine, pentafluoroprop-tetrafluoroeth Assessment/Plan: Osteomyelitis/Discitis of lumbar spine with B/L Psoas abscess  -MRI revealed progressive changes consistent with recurrent diskitis and osteomyelitis with b/l psoas  abscess. No epidural abscess seen on MRI. She was diagnosed with Lumbar discitis and osteomyelitis in  January 2014 but aspirated culture showed no growth and she was treated with 6 weeks of Vanc and [place].  -Patient with ESRD on dialysis, port is likely source of bacteremia to cause osteomyelitis  -Initial Blood culture obtained 09/05/12 1/2 sets has + gram stain showing coag neg negative staph, possible contaminate -CT Aspiration completed 7/2 NGTD from cultures  - Vanc and Cefepime started 7/2 per pharmacy. Will narrow Abx coverage after speciation and sensitivities are obtained.   -Pain improved continue Dilaudid 1mg  Q4 PRN.  -PT/OT continue to treat, patient encouraged to request pain medication if pain prohibits participation in PT/OT.  If patient is discharged will arrange from home services -CRP 3.7, Sed rate 54. Will monitor for effectiveness of Abx treatment.  Active Problems:  ESRD (end stage renal disease) on dialysis  -Normal Dialysis schedule is T, TH, S. (She follows with Dr. Geanie Berlin, HD at [facility]) Cont normal schedule.  -Did not go on Thursday, was not feeling well, is currently at HD today. Hypothyroidism  -Continue home synthroid dose of .  -TSH on 09/05/12 is 114. Unlikely that patient is compliant with home medication.  Anemia in chronic kidney disease(285.21)  -Patient with ESRD, normally hemoglobin runs 8-9 currently hemoglobin is 12.4 before HD. Will continue to monitor.  -Hgb 7/4 was 11.8, no need for EPO  Severe spinal stenosis at L2-3  -Unchanged compared to previous MRI.  -Pain control with PRN pain medication Accelerated Hypertension  -Remains elevated, home HTN meds resumed. Continue Dialysis -Continue to monitor. PVD (peripheral vascular disease)  -No ulcers noted.  Bradycardia-resolved  #Code Status: full code    Dispo: Disposition is deferred at this time, awaiting improvement of current medical problems. Anticipated discharge in approximately 1-2 day(s).  The patient does have a current PCP [name], MD) and does not need an [facility] hospital  follow-up appointment after discharge.  The patient does not have transportation limitations that hinder transportation to clinic appointments.   .Services Needed at time of discharge: Y = Yes, Blank = No PT:   OT: Y  RN:   Equipment: 3 in 1 bedside comode  Other:     LOS: 3 days   Carlynn Purl, DO 09/08/2012, 9:09 AM

## 2012-09-08 NOTE — Procedures (Signed)
I was present at this dialysis session. I have reviewed the session itself and made appropriate changes.   Vinson Moselle  MD Pager [phone]    Cell  [phone] 09/08/2012, 12:36 PM

## 2012-09-08 NOTE — Progress Notes (Signed)
Subjective:  ON HD, back pain improved Objective Vital signs in last 24 hours: Filed Vitals:   09/08/12 0809 09/08/12 0830 09/08/12 0900 09/08/12 0930  BP: 183/68 186/81 182/80 157/65  Pulse: 51 54 63 49  Temp:      TempSrc:      Resp: 18 16 18 16   Height:      Weight:      SpO2:       Weight change: 1.1 kg (2 lb 6.8 oz)  Intake/Output Summary (Last 24 hours) at 09/08/12 0944 Last data filed at 09/07/12 1851  Gross per 24 hour  Intake    240 ml  Output      0 ml  Net    240 ml   Labs: Basic Metabolic Panel:  Recent Labs Lab 09/05/12 0833 09/06/12 0440 09/08/12 0733  NA 135 136 135  K 4.2 4.4 4.1  CL 91* 94* 94*  CO2 25 29 24   GLUCOSE 86 172* 107*  BUN 50* 31* 48*  CREATININE 11.77* 9.16* 12.67*  CALCIUM 8.9 9.1 9.3  PHOS  --   --  4.6   Liver Function Tests:  Recent Labs Lab 09/08/12 0733  ALBUMIN 2.8*   No results found for this basename: LIPASE, AMYLASE,  in the last 168 hours No results found for this basename: AMMONIA,  in the last 168 hours CBC:  Recent Labs Lab 09/05/12 0833 09/06/12 0440 09/08/12 0733  WBC 5.9 4.4 5.7  NEUTROABS 3.2  --   --   HGB 12.4 12.5 11.8*  HCT 37.2 38.5 34.3*  MCV 102.2* 103.8* 99.4  PLT 352 314 296   Cardiac Enzymes: No results found for this basename: CKTOTAL, CKMB, CKMBINDEX, TROPONINI,  in the last 168 hours CBG: No results found for this basename: GLUCAP,  in the last 168 hours  Iron Studies: No results found for this basename: IRON, TIBC, TRANSFERRIN, FERRITIN,  in the last 72 hours  Physical Exam:  General: Alert. NAD , on hd Heart: RRR, No rub or mur  Lungs: CTA bilat  Abdomen: Bs pos. , soft , nontender  Extremities: Dialysis Access: No pedal edema/  Avgg R lower arm patent  Dialysis Orders ([facility] TTS)  4hr F160 Heparin 6000 2.25Ca EDW 52kg RL AVGG  Hectorol 1ug, no Epo - last dose 9000 6/14, venofer 50/week (doesn't need)  Labs: Hgb 13.3 Feb109 and 61%sat 6/28, Ca and P ok - iPTH 196 in April    Assess/Plan  1 Lumbar diskitis/osteo, psoas abscess= recurrent, sp aspirate yesterday on vanc / cefipime / ID seeing 2 ESRD= cont hd TTS/ off schedule today 3 HTN, below EDW but bp high > cont BB, ACEi, dec vol with hd  4 Anemia, Hb 11.8, no epo  5 Sec HPT, cont vit D, binder ca and phos stable 6 Hypothyroidism- synthroid ^'d 150 mcg Jan '14, compliance questionable  7 Bradycardia - improved, afib  8 Hx pe  9 PVD  Lenny Pastel, PA-C [facility] Beeper [phone] 09/08/2012,9:44 AM  LOS: 3 days   Patient seen and examined.  Agree with assessment and plan as above. Vinson Moselle  MD Pager [phone]    Cell  [phone] 09/08/2012, 12:35 PM

## 2012-09-08 NOTE — Progress Notes (Signed)
PT Cancellation Note  Patient Details Name: Nicole [name] MRN: [id number] DOB: [birth year]   Cancelled Treatment:    Reason Eval/Treat Not Completed: Pain limiting ability to participate Family in room reports pt speak less clear and pt has been shaking.  Family would like to speak to RN.  RN notified.  Pt reports being in too much pain and has not received any pain meds today.   Merit Maybee,[name] 09/08/2012, 2:26 PM Zenovia Jarred, PT, DPT 09/08/2012 Pager: [phone]

## 2012-09-09 LAB — CBC
MCV: 98.5 fL (ref 78.0–100.0)
Platelets: 346 10*3/uL (ref 150–400)
RBC: 3.35 MIL/uL — ABNORMAL LOW (ref 3.87–5.11)
WBC: 6.1 10*3/uL (ref 4.0–10.5)

## 2012-09-09 LAB — BASIC METABOLIC PANEL
CO2: 22 mEq/L (ref 19–32)
Chloride: 99 mEq/L (ref 96–112)
Sodium: 136 mEq/L (ref 135–145)

## 2012-09-09 MED ORDER — HYDROMORPHONE HCL PF 1 MG/ML IJ SOLN
1.0000 mg | Freq: Three times a day (TID) | INTRAMUSCULAR | Status: DC
  Administered 2012-09-10: 1 mg via INTRAVENOUS
  Filled 2012-09-09 (×5): qty 1

## 2012-09-09 MED ORDER — HYDROMORPHONE HCL PF 1 MG/ML IJ SOLN
INTRAMUSCULAR | Status: AC
  Administered 2012-09-09: 10:00:00
  Filled 2012-09-09: qty 1

## 2012-09-09 MED ORDER — DOXERCALCIFEROL 4 MCG/2ML IV SOLN
INTRAVENOUS | Status: AC
  Administered 2012-09-09: 1 ug
  Filled 2012-09-09: qty 2

## 2012-09-09 MED ORDER — HYDROMORPHONE HCL PF 1 MG/ML IJ SOLN
0.5000 mg | Freq: Once | INTRAMUSCULAR | Status: AC
  Administered 2012-09-09: 0.5 mg via INTRAVENOUS

## 2012-09-09 MED ORDER — HYDROMORPHONE HCL PF 1 MG/ML IJ SOLN: 0.5000 mg | Freq: Once | INTRAMUSCULAR | Status: DC

## 2012-09-09 MED ORDER — HYDROMORPHONE HCL PF 1 MG/ML IJ SOLN: 1.0000 mg | Freq: Three times a day (TID) | INTRAMUSCULAR | Status: DC

## 2012-09-09 MED ORDER — HYDROMORPHONE HCL PF 1 MG/ML IJ SOLN
INTRAMUSCULAR | Status: AC
  Administered 2012-09-09: 1 mg
  Filled 2012-09-09: qty 1

## 2012-09-09 MED ORDER — PROMETHAZINE HCL 25 MG/ML IJ SOLN
25.0000 mg | Freq: Once | INTRAMUSCULAR | Status: AC
  Administered 2012-09-09: 25 mg via INTRAVENOUS
  Filled 2012-09-09: qty 1

## 2012-09-09 MED ORDER — HYDROMORPHONE HCL PF 1 MG/ML IJ SOLN
INTRAMUSCULAR | Status: AC
  Filled 2012-09-09: qty 1

## 2012-09-09 MED ORDER — HYDROMORPHONE HCL PF 1 MG/ML IJ SOLN
0.5000 mg | INTRAMUSCULAR | Status: DC | PRN
  Administered 2012-09-09: 0.5 mg via INTRAVENOUS
  Filled 2012-09-09: qty 1

## 2012-09-09 MED ORDER — MORPHINE SULFATE 2 MG/ML IJ SOLN: 2.0000 mg | INTRAMUSCULAR | Status: DC | PRN

## 2012-09-09 MED ORDER — POLYETHYLENE GLYCOL 3350 17 G PO PACK
17.0000 g | PACK | Freq: Two times a day (BID) | ORAL | Status: DC
  Administered 2012-09-10 – 2012-09-15 (×7): 17 g via ORAL
  Filled 2012-09-09 (×13): qty 1

## 2012-09-09 NOTE — Procedures (Signed)
I was present at this dialysis session. I have reviewed the session itself and made appropriate changes.   Vinson Moselle  MD Pager [phone]    Cell  [phone] 09/09/2012, 9:21 AM

## 2012-09-09 NOTE — Progress Notes (Signed)
PT Cancellation Note  Patient Details Name: Nicole [name] MRN: [id number] DOB: [birth year]   Cancelled Treatment:    Reason Eval/Treat Not Completed: Pain limiting ability to participate   [facility] 09/09/2012, 1:55 PM

## 2012-09-09 NOTE — Progress Notes (Signed)
ANTIBIOTIC CONSULT NOTE - FOLLOW UP  Pharmacy Consult for vancomycin and cefepime Indication: bacteremia/osteo/diskitis/ psoas abscess   No Known Allergies  Patient Measurements: Height: 5\' 3"  (160 cm) Weight: 105 lb 2.6 oz (47.7 kg) IBW/kg (Calculated) : 52.4   Vital Signs: Temp: 98.6 F (37 C) (07/05 0655) Temp src: Oral (07/05 0655) BP: 122/54 mmHg (07/05 1100) Pulse Rate: 58 (07/05 1100) Intake/Output from previous day: 07/04 0701 - 07/05 0700 In: 240 [P.O.:240] Out: 1907  Intake/Output from this shift:    Labs:  Recent Labs  09/08/12 0733 09/09/12 0730  WBC 5.7 6.1  HGB 11.8* 11.6*  PLT 296 346  CREATININE 12.67* 6.89*   Estimated Creatinine Clearance: 5.1 ml/min (by C-G formula based on Cr of 6.89). No results found for this basename: VANCOTROUGH, VANCOPEAK, VANCORANDOM, GENTTROUGH, GENTPEAK, GENTRANDOM, TOBRATROUGH, TOBRAPEAK, TOBRARND, AMIKACINPEAK, AMIKACINTROU, AMIKACIN,  in the last 72 hours   Microbiology: Recent Results (from the past 720 hour(s))  CULTURE, BLOOD (ROUTINE X 2)     Status: None   Collection Time    09/05/12  1:00 PM      Result Value Range Status   Specimen Description BLOOD HAND LEFT   Final   Special Requests BOTTLES DRAWN AEROBIC AND ANAEROBIC 4CC   Final   Culture  Setup Time 09/05/2012 16:21   Final   Culture     Final   Value: STAPHYLOCOCCUS SPECIES (COAGULASE NEGATIVE)     Note: THE SIGNIFICANCE OF ISOLATING THIS ORGANISM FROM A SINGLE SET OF BLOOD CULTURES WHEN MULTIPLE SETS ARE DRAWN IS UNCERTAIN. PLEASE NOTIFY THE MICROBIOLOGY DEPARTMENT WITHIN ONE WEEK IF SPECIATION AND SENSITIVITIES ARE REQUIRED.     Note: Gram Stain Report Called to,Read Back By and Verified With: Marney Setting @ [phone] 09/06/12 BY KRAWS   Report Status 09/07/2012 FINAL   Final  CULTURE, BLOOD (ROUTINE X 2)     Status: None   Collection Time    09/05/12  4:45 PM      Result Value Range Status   Specimen Description BLOOD HEMODIALYSIS LINE   Final   Special Requests BOTTLES DRAWN AEROBIC AND ANAEROBIC 10CC   Final   Culture  Setup Time 09/06/2012 00:16   Final   Culture     Final   Value:        BLOOD CULTURE RECEIVED NO GROWTH TO DATE CULTURE WILL BE HELD FOR 5 DAYS BEFORE ISSUING A FINAL NEGATIVE REPORT   Report Status PENDING   Incomplete  MRSA PCR SCREENING     Status: None   Collection Time    09/05/12  9:48 PM      Result Value Range Status   MRSA by PCR NEGATIVE  NEGATIVE Final   Comment:            The GeneXpert MRSA Assay (FDA     approved for NASAL specimens     only), is one component of a     comprehensive MRSA colonization     surveillance program. It is not     intended to diagnose MRSA     infection nor to guide or     monitor treatment for     MRSA infections.  GRAM STAIN     Status: None   Collection Time    09/06/12  2:32 PM      Result Value Range Status   Specimen Description ABSCESS BACK   Final   Special Requests DISC SPACE L 2 3   Final  Gram Stain     Final   Value: ABUNDANT WBC PRESENT,BOTH PMN AND MONONUCLEAR     NO ORGANISMS SEEN   Report Status 09/06/2012 FINAL   Final  CULTURE, ROUTINE-ABSCESS     Status: None   Collection Time    09/06/12  2:32 PM      Result Value Range Status   Specimen Description ABSCESS BACK   Final   Special Requests L 2 3 SPACE   Final   Gram Stain     Final   Value: ABUNDANT WBC PRESENT,BOTH PMN AND MONONUCLEAR     NO SQUAMOUS EPITHELIAL CELLS SEEN     NO ORGANISMS SEEN     Performed at [facility]   Culture NO GROWTH 2 DAYS   Final   Report Status PENDING   Incomplete    Anti-infectives   Start     Dose/Rate Route Frequency Ordered Stop   09/07/12 1800  ceFEPIme (MAXIPIME) 2 g in dextrose 5 % 50 mL IVPB     2 g 100 mL/hr over 30 Minutes Intravenous Every T-Th-Sa (1800) 09/06/12 1540     09/07/12 1200  vancomycin (VANCOCIN) 500 mg in sodium chloride 0.9 % 100 mL IVPB     500 mg 100 mL/hr over 60 Minutes Intravenous Every T-Th-Sa (Hemodialysis)  09/06/12 1540     09/06/12 1600  vancomycin (VANCOCIN) IVPB 1000 mg/200 mL premix     1,000 mg 200 mL/hr over 60 Minutes Intravenous  Once 09/06/12 1538 09/06/12 1903   09/06/12 1600  ceFEPIme (MAXIPIME) 2 g in dextrose 5 % 50 mL IVPB     2 g 100 mL/hr over 30 Minutes Intravenous  Once 09/06/12 1538 09/06/12 1649   09/05/12 1215  vancomycin (VANCOCIN) IVPB 1000 mg/200 mL premix  Status:  Discontinued     1,000 mg 200 mL/hr over 60 Minutes Intravenous  Once 09/05/12 1205 09/05/12 1217      Assessment: Patient is a 79 y.o F on vancomycin and cefepime for recurrent diskitis and bilateral psoas abscesses.  Plan to continue with current abx for now.   Patient received HD yesterday 7/04 (off regular schedule with abx given after session.  Pt's now in HD.  Vanc 7/2>> Cefepime 7/2>>  7/1 Blood - coag (-) staph in 1/2; other ngtd 7/2 Back abscess - ngtd   Goal of Therapy:  Pre-HD vancomycin level= 15-25  Plan:  1) no change for abx 2) f/u with cultures 3) Will plan on checking pre-HD vancomycin level next week  Stephana Morell P 09/09/2012,11:03 AM

## 2012-09-09 NOTE — Progress Notes (Signed)
Subjective: lethargic, after pain meds, no complaints, on HD  Objective Filed Vitals:   09/09/12 0730 09/09/12 0800 09/09/12 0830 09/09/12 0900  BP: 149/76 158/73 122/66 112/54  Pulse: 67 56 67 67  Temp:      TempSrc:      Resp: 16 18 16 18   Height:      Weight:      SpO2:        CBC:  Recent Labs Lab 09/05/12 0833 09/06/12 0440 09/08/12 0733 09/09/12 0730  WBC 5.9 4.4 5.7 6.1  NEUTROABS 3.2  --   --   --   HGB 12.4 12.5 11.8* 11.6*  HCT 37.2 38.5 34.3* 33.0*  MCV 102.2* 103.8* 99.4 98.5  PLT 352 314 296 346   Basic Metabolic Panel / Renal Panel:  Recent Labs Lab 09/06/12 0440 09/08/12 0733 09/09/12 0730  NA 136 135 136  K 4.4 4.1 5.7*  CL 94* 94* 99  CO2 29 24 22   GLUCOSE 172* 107* 98  BUN 31* 48* 21  CREATININE 9.16* 12.67* 6.89*  CALCIUM 9.1 9.3 8.4  PHOS  --  4.6  --     Physical Exam:  Blood pressure 112/54, pulse 67, temperature 98.6 F (37 C), temperature source Oral, resp. rate 18, height 5\' 3"  (1.6 m), weight 47.7 kg (105 lb 2.6 oz), SpO2 94.00%.  Physical Exam:  Gen: lethargic after dilaudid, on HD Heart: reg no murmur, forceful pmi Lungs: R clear, dec'd L base Abd: soft, no ascites, scaphoid Ext: no leg edema, R FA AVGG patent  Dialysis Orders ([facility] TTS)  F160   4hr   2.25Ca    EDW 52kg    RFA AVG   Heparin 6000 Hectorol 1ug, no Epo - last dose 9000 6/14, venofer 50/week (doesn't need)  Labs: Hgb 13.3 Feb109 and 61%sat 6/28, Ca and P ok - iPTH 196 in April   Assess/Plan  1 Discitis / psoas abcess- asp cx's neg so far, abx per ID 2 CKD, hd TTS- stable vol 3 HTN, cont acei, norvasc 4 Anemia- Hb 11.8, no epo  5 Sec HPT- cont vit D, pth in range, phos 4.6, not on binder 6 Hypothyroidism- synthroid ^'d 150 mcg Jan '14, compliance questionable  7 Bradycardia- NSR on ekg 8 Hx pe  9 PVD  Vinson Moselle  MD Pager [phone]    Cell  [phone] 09/09/2012, 9:12 AM

## 2012-09-09 NOTE — Progress Notes (Signed)
[facility]  Day 4 vancomycin  Day 4 cefepime    Subjective: No new complaints   Antibiotics:  Anti-infectives   Start     Dose/Rate Route Frequency Ordered Stop   09/07/12 1800  ceFEPIme (MAXIPIME) 2 g in dextrose 5 % 50 mL IVPB     2 g 100 mL/hr over 30 Minutes Intravenous Every T-Th-Sa (1800) 09/06/12 1540     09/07/12 1200  vancomycin (VANCOCIN) 500 mg in sodium chloride 0.9 % 100 mL IVPB     500 mg 100 mL/hr over 60 Minutes Intravenous Every T-Th-Sa (Hemodialysis) 09/06/12 1540     09/06/12 1600  vancomycin (VANCOCIN) IVPB 1000 mg/200 mL premix     1,000 mg 200 mL/hr over 60 Minutes Intravenous  Once 09/06/12 1538 09/06/12 1903   09/06/12 1600  ceFEPIme (MAXIPIME) 2 g in dextrose 5 % 50 mL IVPB     2 g 100 mL/hr over 30 Minutes Intravenous  Once 09/06/12 1538 09/06/12 1649   09/05/12 1215  vancomycin (VANCOCIN) IVPB 1000 mg/200 mL premix  Status:  Discontinued     1,000 mg 200 mL/hr over 60 Minutes Intravenous  Once 09/05/12 1205 09/05/12 1217      Medications: Scheduled Meds: . amLODipine  10 mg Oral QHS  . calcium acetate  1,334 mg Oral TID WC  . ceFEPime (MAXIPIME) IV  2 g Intravenous Q T,Th,Sat-1800  . docusate sodium  100 mg Oral BID  . doxercalciferol  1 mcg Intravenous Q T,Th,Sa-HD  . feeding supplement (NEPRO CARB STEADY)  237 mL Oral BID BM  . heparin  5,000 Units Subcutaneous Q8H  .  HYDROmorphone (DILAUDID) injection  1 mg Intravenous TID  . levothyroxine  150 mcg Oral QAC breakfast  . lisinopril  40 mg Oral QHS  . multivitamin  1 tablet Oral QHS  . polyethylene glycol  17 g Oral BID  . sodium chloride  3 mL Intravenous Q12H  . vancomycin  500 mg Intravenous Q T,Th,Sa-HD   Continuous Infusions:  PRN Meds:.sodium chloride, sodium chloride, feeding supplement (NEPRO CARB STEADY), heparin, heparin, lidocaine (PF), lidocaine-prilocaine, pentafluoroprop-tetrafluoroeth   Objective: Weight change: -3 lb 12 oz (-1.7  kg)  Intake/Output Summary (Last 24 hours) at 09/09/12 1040 Last data filed at 09/08/12 2109  Gross per 24 hour  Intake    240 ml  Output   1907 ml  Net  -1667 ml   Blood pressure 136/67, pulse 61, temperature 98.6 F (37 C), temperature source Oral, resp. rate 20, height 5\' 3"  (1.6 m), weight 105 lb 2.6 oz (47.7 kg), SpO2 94.00%. Temp:  [97.5 F (36.4 C)-98.9 F (37.2 C)] 98.6 F (37 C) (07/05 0655) Pulse Rate:  [56-84] 61 (07/05 1030) Resp:  [14-22] 20 (07/05 1030) BP: (100-175)/(54-104) 136/67 mmHg (07/05 1030) SpO2:  [94 %-100 %] 94 % (07/05 0655) Weight:  [105 lb 2.6 oz (47.7 kg)-110 lb 3.7 oz (50.001 kg)] 105 lb 2.6 oz (47.7 kg) (07/05 1610)  Physical Exam: General: Alert and awake, oriented x3, not in any acute distress. Receiving HD  NEuro: nonfocal  Lab Results:  Recent Labs  09/08/12 0733 09/09/12 0730  WBC 5.7 6.1  HGB 11.8* 11.6*  HCT 34.3* 33.0*  PLT 296 346    BMET  Recent Labs  09/08/12 0733 09/09/12 0730  NA 135 136  K 4.1 5.7*  CL 94* 99  CO2 24 22  GLUCOSE 107* 98  BUN 48* 21  CREATININE 12.67* 6.89*  CALCIUM 9.3 8.4    Micro Results: Recent Results (from the past 240 hour(s))  CULTURE, BLOOD (ROUTINE X 2)     Status: None   Collection Time    09/05/12  1:00 PM      Result Value Range Status   Specimen Description BLOOD HAND LEFT   Final   Special Requests BOTTLES DRAWN AEROBIC AND ANAEROBIC 4CC   Final   Culture  Setup Time 09/05/2012 16:21   Final   Culture     Final   Value: STAPHYLOCOCCUS SPECIES (COAGULASE NEGATIVE)     Note: THE SIGNIFICANCE OF ISOLATING THIS ORGANISM FROM A SINGLE SET OF BLOOD CULTURES WHEN MULTIPLE SETS ARE DRAWN IS UNCERTAIN. PLEASE NOTIFY THE MICROBIOLOGY DEPARTMENT WITHIN ONE WEEK IF SPECIATION AND SENSITIVITIES ARE REQUIRED.     Note: Gram Stain Report Called to,Read Back By and Verified With: Marney Setting @ [phone] 09/06/12 BY KRAWS   Report Status 09/07/2012 FINAL   Final  CULTURE, BLOOD (ROUTINE X 2)      Status: None   Collection Time    09/05/12  4:45 PM      Result Value Range Status   Specimen Description BLOOD HEMODIALYSIS LINE   Final   Special Requests BOTTLES DRAWN AEROBIC AND ANAEROBIC 10CC   Final   Culture  Setup Time 09/06/2012 00:16   Final   Culture     Final   Value:        BLOOD CULTURE RECEIVED NO GROWTH TO DATE CULTURE WILL BE HELD FOR 5 DAYS BEFORE ISSUING A FINAL NEGATIVE REPORT   Report Status PENDING   Incomplete  MRSA PCR SCREENING     Status: None   Collection Time    09/05/12  9:48 PM      Result Value Range Status   MRSA by PCR NEGATIVE  NEGATIVE Final   Comment:            The GeneXpert MRSA Assay (FDA     approved for NASAL specimens     only), is one component of a     comprehensive MRSA colonization     surveillance program. It is not     intended to diagnose MRSA     infection nor to guide or     monitor treatment for     MRSA infections.  GRAM STAIN     Status: None   Collection Time    09/06/12  2:32 PM      Result Value Range Status   Specimen Description ABSCESS BACK   Final   Special Requests DISC SPACE L 2 3   Final   Gram Stain     Final   Value: ABUNDANT WBC PRESENT,BOTH PMN AND MONONUCLEAR     NO ORGANISMS SEEN   Report Status 09/06/2012 FINAL   Final  CULTURE, ROUTINE-ABSCESS     Status: None   Collection Time    09/06/12  2:32 PM      Result Value Range Status   Specimen Description ABSCESS BACK   Final   Special Requests L 2 3 SPACE   Final   Gram Stain     Final   Value: ABUNDANT WBC PRESENT,BOTH PMN AND MONONUCLEAR     NO SQUAMOUS EPITHELIAL CELLS SEEN     NO ORGANISMS SEEN     Performed at [facility]   Culture NO GROWTH 2 DAYS   Final   Report Status PENDING   Incomplete    Studies/Results: No  results found.    Assessment/Plan: Nicole [name] is a 79 y.o. female with prior Diskitis Lumbar area in January with negative aspirate now with recurrent diskitis with bilateral psoas abscesses, 1/2 blood cultures  from admission with Coag NEg STaph. Disk cx NGTD.  #1 Recurrent diskitis with psoas abscess:   I called her HD center and there is no record of blood cultures drawn there since before January of this year  --I also think it would be worth asking IR to aspirate one or both of the psoas abscesses and sending this for culture as there might be viable organisms in the abscess cavity  --otherwise would continue with vancomycin and cefepime for now  #2 Screening: check HIV   LOS: 4 days   Paulette Blanch Dam 09/09/2012, 10:40 AM

## 2012-09-09 NOTE — Progress Notes (Signed)
Pt complained of vomiting and nausea, vomited once to a clear fluid, no blood noted in mod amt.  Dr. Angelina Sheriff was informed with new orders and carried out.  He said that he will see the pt.  Will continue to monitor.

## 2012-09-09 NOTE — Progress Notes (Signed)
Subjective: Yesterday patient lost IV access was refusing reestablishment secondary to pain, was given 10-650 of percocet at 2:30pm and IV access was reestablished.  Patient also received Dilaudid 1mg  last night at 10:30pm and this morning at 7:15am, was still in pain and received an additional 0.5 mg at 7:45am.  Patient was seen this morning at bedside in HD.  She remains tearful about her stay and condition but reports that the pain has now decreased to 2/10.  She reports otherwise she is doing ok.  She admits that she is not eating much, and attributes that to poor appetite.  But reports she is drinking ok and consuming her feeding supplements.  Patient was told that we spoke with her son yesterday and that he said he is concerned about her coming back to stay with him and his wife since they both work and she is alone for part of the day.  She admits that she does spend a lot of time alone and is scared at times to get out of bed to go to the bathroom. Objective: Vital signs in last 24 hours: Filed Vitals:   09/09/12 0830 09/09/12 0900 09/09/12 0930 09/09/12 1000  BP: 122/66 112/54 123/62 158/84  Pulse: 67 67 78 66  Temp:      TempSrc:      Resp: 16 18 22 18   Height:      Weight:      SpO2:       Weight change: -3 lb 12 oz (-1.7 kg)  Intake/Output Summary (Last 24 hours) at 09/09/12 1030 Last data filed at 09/08/12 2109  Gross per 24 hour  Intake    240 ml  Output   1907 ml  Net  -1667 ml   Vitals Reviewed  General: resting in bed, undergoing HD  Cardiac: RRR, no rubs, murmurs or gallops  Pulm: clear to auscultation bilaterally  Abd: soft, nontender, nondistended, BS normoactive  Ext: warm and well perfused, no pedal edema.  Neuro: alert and oriented X3  Dialysis site: RUE  Lab Results: Basic Metabolic Panel:  Recent Labs Lab 09/08/12 0733 09/09/12 0730  NA 135 136  K 4.1 5.7*  CL 94* 99  CO2 24 22  GLUCOSE 107* 98  BUN 48* 21  CREATININE 12.67* 6.89*  CALCIUM  9.3 8.4  PHOS 4.6  --    Liver Function Tests:  Recent Labs Lab 09/08/12 0733  ALBUMIN 2.8*   CBC:  Recent Labs Lab 09/05/12 0833  09/08/12 0733 09/09/12 0730  WBC 5.9  < > 5.7 6.1  NEUTROABS 3.2  --   --   --   HGB 12.4  < > 11.8* 11.6*  HCT 37.2  < > 34.3* 33.0*  MCV 102.2*  < > 99.4 98.5  PLT 352  < > 296 346  < > = values in this interval not displayed. CBG:  Recent Labs Lab 09/08/12 1437 09/08/12 1725  GLUCAP 150* 121*   Thyroid Function Tests:  Recent Labs Lab 09/05/12 1524  TSH 114.710*   Coagulation:  Recent Labs Lab 09/06/12 0440  LABPROT 12.7  INR 0.97    Micro Results: Recent Results (from the past 240 hour(s))  CULTURE, BLOOD (ROUTINE X 2)     Status: None   Collection Time    09/05/12  1:00 PM      Result Value Range Status   Specimen Description BLOOD HAND LEFT   Final   Special Requests BOTTLES DRAWN AEROBIC AND ANAEROBIC 4CC  Final   Culture  Setup Time 09/05/2012 16:21   Final   Culture     Final   Value: STAPHYLOCOCCUS SPECIES (COAGULASE NEGATIVE)     Note: THE SIGNIFICANCE OF ISOLATING THIS ORGANISM FROM A SINGLE SET OF BLOOD CULTURES WHEN MULTIPLE SETS ARE DRAWN IS UNCERTAIN. PLEASE NOTIFY THE MICROBIOLOGY DEPARTMENT WITHIN ONE WEEK IF SPECIATION AND SENSITIVITIES ARE REQUIRED.     Note: Gram Stain Report Called to,Read Back By and Verified With: Marney Setting @ [phone] 09/06/12 BY KRAWS   Report Status 09/07/2012 FINAL   Final  CULTURE, BLOOD (ROUTINE X 2)     Status: None   Collection Time    09/05/12  4:45 PM      Result Value Range Status   Specimen Description BLOOD HEMODIALYSIS LINE   Final   Special Requests BOTTLES DRAWN AEROBIC AND ANAEROBIC 10CC   Final   Culture  Setup Time 09/06/2012 00:16   Final   Culture     Final   Value:        BLOOD CULTURE RECEIVED NO GROWTH TO DATE CULTURE WILL BE HELD FOR 5 DAYS BEFORE ISSUING A FINAL NEGATIVE REPORT   Report Status PENDING   Incomplete  MRSA PCR SCREENING     Status: None    Collection Time    09/05/12  9:48 PM      Result Value Range Status   MRSA by PCR NEGATIVE  NEGATIVE Final   Comment:            The GeneXpert MRSA Assay (FDA     approved for NASAL specimens     only), is one component of a     comprehensive MRSA colonization     surveillance program. It is not     intended to diagnose MRSA     infection nor to guide or     monitor treatment for     MRSA infections.  GRAM STAIN     Status: None   Collection Time    09/06/12  2:32 PM      Result Value Range Status   Specimen Description ABSCESS BACK   Final   Special Requests DISC SPACE L 2 3   Final   Gram Stain     Final   Value: ABUNDANT WBC PRESENT,BOTH PMN AND MONONUCLEAR     NO ORGANISMS SEEN   Report Status 09/06/2012 FINAL   Final  CULTURE, ROUTINE-ABSCESS     Status: None   Collection Time    09/06/12  2:32 PM      Result Value Range Status   Specimen Description ABSCESS BACK   Final   Special Requests L 2 3 SPACE   Final   Gram Stain     Final   Value: ABUNDANT WBC PRESENT,BOTH PMN AND MONONUCLEAR     NO SQUAMOUS EPITHELIAL CELLS SEEN     NO ORGANISMS SEEN     Performed at [facility]   Culture NO GROWTH 2 DAYS   Final   Report Status PENDING   Incomplete   Medications: I have reviewed the patient's current medications. Scheduled Meds: . amLODipine  10 mg Oral QHS  . calcium acetate  1,334 mg Oral TID WC  . ceFEPime (MAXIPIME) IV  2 g Intravenous Q T,Th,Sat-1800  . docusate sodium  100 mg Oral BID  . doxercalciferol  1 mcg Intravenous Q T,Th,Sa-HD  . feeding supplement (NEPRO CARB STEADY)  237 mL Oral BID BM  . heparin  5,000 Units Subcutaneous Q8H  .  HYDROmorphone (DILAUDID) injection  1 mg Intravenous TID  . levothyroxine  150 mcg Oral QAC breakfast  . lisinopril  40 mg Oral QHS  . multivitamin  1 tablet Oral QHS  . polyethylene glycol  17 g Oral BID  . sodium chloride  3 mL Intravenous Q12H  . vancomycin  500 mg Intravenous Q T,Th,Sa-HD   Continuous  Infusions: none PRN Meds:.sodium chloride, sodium chloride, feeding supplement (NEPRO CARB STEADY), heparin, heparin, lidocaine (PF), lidocaine-prilocaine, pentafluoroprop-tetrafluoroeth Assessment/Plan: Osteomyelitis/Discitis of lumbar spine with B/L Psoas abscess  -MRI revealed progressive changes consistent with recurrent diskitis and osteomyelitis with b/l psoas abscess. No epidural abscess seen on MRI. She was diagnosed with Lumbar discitis and osteomyelitis in January 2014 but aspirated culture showed no growth and she was treated with 6 weeks of Vanc and [place].  -Patient with ESRD on dialysis, port is likely source of bacteremia to cause osteomyelitis  -Initial Blood culture obtained 09/05/12 1/2 sets has + gram stain showing coag neg negative staph, possible contaminate  -CT Aspiration completed 7/2 NGTD from cultures, will ask IR to aspirate one or both psoas abscesses and send for culture. - Vanc and Cefepime started 7/2 per pharmacy.  -Follow up HIV test. -Pain improved with Dilaudid 1mg  Q4 PRN, patient not requesting pain medication often.  Will change to TID with hold parameters, will  -PT/OT continue to treat, patient encouraged to request pain medication if pain prohibits participation in PT/OT. Patient may need to go to SNF after discharge. -CRP 3.7, Sed rate 54. Will monitor for effectiveness of Abx treatment.  Active Problems:  ESRD (end stage renal disease) on dialysis  -Normal Dialysis schedule is T, TH, S. (She follows with Dr. Geanie Berlin, HD at [facility]) Cont normal schedule.  -Back on normal schedule today. Hypothyroidism  -Continue home synthroid dose of .  -TSH on 09/05/12 is 114. Unlikely that patient is compliant with home medication.  Anemia in chronic kidney disease(285.21)  -Patient with ESRD, normally hemoglobin runs 8-9 currently hemoglobin is 12.4 before HD. Will continue to monitor.  -Hgb 7/5 was 11.6, no need for EPO. Severe spinal stenosis at L2-3  -Unchanged  compared to previous MRI.  -Pain control with PRN pain medication  Accelerated Hypertension-resolved -Remains elevated, home HTN meds resumed. Continue Dialysis  -Continue to monitor.  PVD (peripheral vascular disease)  -No ulcers noted.  Bradycardia-resolved  #Code Status: full code    Dispo: Disposition is deferred at this time, awaiting improvement of current medical problems.  Anticipated discharge in approximately 1-2 day(s).   The patient does have a current PCP [name], MD) and does not need an [facility] hospital follow-up appointment after discharge.  The patient does not have transportation limitations that hinder transportation to clinic appointments.  .Services Needed at time of discharge: Y = Yes, Blank = No PT:   OT: Y  RN:   Equipment: Y 3 in 1 bedside comode  Other:     LOS: 4 days   Carlynn Purl, DO 09/09/2012, 10:30 AM

## 2012-09-10 DIAGNOSIS — E46 Unspecified protein-calorie malnutrition: Secondary | ICD-10-CM | POA: Diagnosis present

## 2012-09-10 LAB — BASIC METABOLIC PANEL
BUN: 19 mg/dL (ref 6–23)
Chloride: 95 mEq/L — ABNORMAL LOW (ref 96–112)
Creatinine, Ser: 5.69 mg/dL — ABNORMAL HIGH (ref 0.50–1.10)
GFR calc Af Amer: 7 mL/min — ABNORMAL LOW (ref >90)
Glucose, Bld: 113 mg/dL — ABNORMAL HIGH (ref 70–99)

## 2012-09-10 LAB — CULTURE, ROUTINE-ABSCESS

## 2012-09-10 MED ORDER — HYDROMORPHONE HCL PF 1 MG/ML IJ SOLN
1.0000 mg | INTRAMUSCULAR | Status: DC | PRN
  Administered 2012-09-10 – 2012-09-12 (×8): 1 mg via INTRAVENOUS
  Filled 2012-09-10 (×8): qty 1

## 2012-09-10 MED ORDER — HYDROMORPHONE HCL PF 1 MG/ML IJ SOLN: 0.5000 mg | INTRAMUSCULAR | Status: DC | PRN

## 2012-09-10 MED ORDER — LISINOPRIL 20 MG PO TABS
20.0000 mg | ORAL_TABLET | Freq: Every day | ORAL | Status: DC
  Administered 2012-09-10 – 2012-09-14 (×5): 20 mg via ORAL
  Filled 2012-09-10 (×6): qty 1

## 2012-09-10 MED ORDER — HYDROMORPHONE HCL PF 1 MG/ML IJ SOLN
1.0000 mg | INTRAMUSCULAR | Status: DC | PRN
  Administered 2012-09-10: 0.5 mg via INTRAVENOUS

## 2012-09-10 NOTE — Progress Notes (Signed)
[facility]  Day 5 vancomycin  Day 5 cefepime    Subjective: C/o severe back pain   Antibiotics:  Anti-infectives   Start     Dose/Rate Route Frequency Ordered Stop   09/07/12 1800  ceFEPIme (MAXIPIME) 2 g in dextrose 5 % 50 mL IVPB     2 g 100 mL/hr over 30 Minutes Intravenous Every T-Th-Sa (1800) 09/06/12 1540     09/07/12 1200  vancomycin (VANCOCIN) 500 mg in sodium chloride 0.9 % 100 mL IVPB     500 mg 100 mL/hr over 60 Minutes Intravenous Every T-Th-Sa (Hemodialysis) 09/06/12 1540     09/06/12 1600  vancomycin (VANCOCIN) IVPB 1000 mg/200 mL premix     1,000 mg 200 mL/hr over 60 Minutes Intravenous  Once 09/06/12 1538 09/06/12 1903   09/06/12 1600  ceFEPIme (MAXIPIME) 2 g in dextrose 5 % 50 mL IVPB     2 g 100 mL/hr over 30 Minutes Intravenous  Once 09/06/12 1538 09/06/12 1649   09/05/12 1215  vancomycin (VANCOCIN) IVPB 1000 mg/200 mL premix  Status:  Discontinued     1,000 mg 200 mL/hr over 60 Minutes Intravenous  Once 09/05/12 1205 09/05/12 1217      Medications: Scheduled Meds: . amLODipine  10 mg Oral QHS  . calcium acetate  1,334 mg Oral TID WC  . ceFEPime (MAXIPIME) IV  2 g Intravenous Q T,Th,Sat-1800  . docusate sodium  100 mg Oral BID  . doxercalciferol  1 mcg Intravenous Q T,Th,Sa-HD  . feeding supplement (NEPRO CARB STEADY)  237 mL Oral BID BM  . heparin  5,000 Units Subcutaneous Q8H  . levothyroxine  150 mcg Oral QAC breakfast  . lisinopril  40 mg Oral QHS  . multivitamin  1 tablet Oral QHS  . polyethylene glycol  17 g Oral BID  . sodium chloride  3 mL Intravenous Q12H  . vancomycin  500 mg Intravenous Q T,Th,Sa-HD   Continuous Infusions:  PRN Meds:.HYDROmorphone (DILAUDID) injection   Objective: Weight change: -8 lb 6 oz (-3.8 kg)  Intake/Output Summary (Last 24 hours) at 09/10/12 1310 Last data filed at 09/10/12 0900  Gross per 24 hour  Intake    410 ml  Output      0 ml  Net    410 ml   Blood pressure  119/65, pulse 75, temperature 99 F (37.2 C), temperature source Oral, resp. rate 16, height 5\' 3"  (1.6 m), weight 101 lb 13.6 oz (46.199 kg), SpO2 99.00%. Temp:  [98.3 F (36.8 C)-99.4 F (37.4 C)] 99 F (37.2 C) (07/06 1011) Pulse Rate:  [74-76] 75 (07/06 1011) Resp:  [16] 16 (07/06 1011) BP: (106-121)/(55-65) 119/65 mmHg (07/06 1011) SpO2:  [99 %-100 %] 99 % (07/06 1011) Weight:  [101 lb 13.6 oz (46.199 kg)] 101 lb 13.6 oz (46.199 kg) (07/05 2104)  Physical Exam: General: Alert and awake, , not in any acute distress. CV: RR no mgr Pulm clear Skin: fistula site not overtly inflamed NEuro: nonfocal  Lab Results:  Recent Labs  09/08/12 0733 09/09/12 0730  WBC 5.7 6.1  HGB 11.8* 11.6*  HCT 34.3* 33.0*  PLT 296 346    BMET  Recent Labs  09/09/12 0730 09/10/12 0906  NA 136 138  K 5.7* 3.7  CL 99 95*  CO2 22 29  GLUCOSE 98 113*  BUN 21 19  CREATININE 6.89* 5.69*  CALCIUM 8.4 9.6    Micro Results: Recent Results (from the past 240  hour(s))  CULTURE, BLOOD (ROUTINE X 2)     Status: None   Collection Time    09/05/12  1:00 PM      Result Value Range Status   Specimen Description BLOOD HAND LEFT   Final   Special Requests BOTTLES DRAWN AEROBIC AND ANAEROBIC 4CC   Final   Culture  Setup Time 09/05/2012 16:21   Final   Culture     Final   Value: STAPHYLOCOCCUS SPECIES (COAGULASE NEGATIVE)     Note: THE SIGNIFICANCE OF ISOLATING THIS ORGANISM FROM A SINGLE SET OF BLOOD CULTURES WHEN MULTIPLE SETS ARE DRAWN IS UNCERTAIN. PLEASE NOTIFY THE MICROBIOLOGY DEPARTMENT WITHIN ONE WEEK IF SPECIATION AND SENSITIVITIES ARE REQUIRED.     Note: Gram Stain Report Called to,Read Back By and Verified With: Marney Setting @ [phone] 09/06/12 BY KRAWS   Report Status 09/07/2012 FINAL   Final  CULTURE, BLOOD (ROUTINE X 2)     Status: None   Collection Time    09/05/12  4:45 PM      Result Value Range Status   Specimen Description BLOOD HEMODIALYSIS LINE   Final   Special Requests BOTTLES  DRAWN AEROBIC AND ANAEROBIC 10CC   Final   Culture  Setup Time 09/06/2012 00:16   Final   Culture     Final   Value:        BLOOD CULTURE RECEIVED NO GROWTH TO DATE CULTURE WILL BE HELD FOR 5 DAYS BEFORE ISSUING A FINAL NEGATIVE REPORT   Report Status PENDING   Incomplete  MRSA PCR SCREENING     Status: None   Collection Time    09/05/12  9:48 PM      Result Value Range Status   MRSA by PCR NEGATIVE  NEGATIVE Final   Comment:            The GeneXpert MRSA Assay (FDA     approved for NASAL specimens     only), is one component of a     comprehensive MRSA colonization     surveillance program. It is not     intended to diagnose MRSA     infection nor to guide or     monitor treatment for     MRSA infections.  GRAM STAIN     Status: None   Collection Time    09/06/12  2:32 PM      Result Value Range Status   Specimen Description ABSCESS BACK   Final   Special Requests DISC SPACE L 2 3   Final   Gram Stain     Final   Value: ABUNDANT WBC PRESENT,BOTH PMN AND MONONUCLEAR     NO ORGANISMS SEEN   Report Status 09/06/2012 FINAL   Final  CULTURE, ROUTINE-ABSCESS     Status: None   Collection Time    09/06/12  2:32 PM      Result Value Range Status   Specimen Description ABSCESS BACK   Final   Special Requests L 2 3 SPACE   Final   Gram Stain     Final   Value: ABUNDANT WBC PRESENT,BOTH PMN AND MONONUCLEAR     NO SQUAMOUS EPITHELIAL CELLS SEEN     NO ORGANISMS SEEN     Performed at [facility]   Culture NO GROWTH 3 DAYS   Final   Report Status 09/10/2012 FINAL   Final    Studies/Results: No results found.    Assessment/Plan: Nicole [name] is a 79 y.o.  female with prior Diskitis Lumbar area in January with negative aspirate now with recurrent diskitis with bilateral psoas abscesses, 1/2 blood cultures from admission with Coag NEg STaph. Disk cx NGTD.  #1 Recurrent diskitis with psoas abscess:   I called her HD center and there is no record of blood cultures drawn  there since before January of this year  --I  Agree with IR aspiration of  psoas abscesses and sending this for culture as there might be viable organisms in the abscess cavity  #2 Screening: check HIV  Dr. Orvan Falconer back tomorrow.    LOS: 5 days   Acey Lav 09/10/2012, 1:10 PM

## 2012-09-10 NOTE — Progress Notes (Signed)
PT Cancellation Note  Patient Details Name: Nicole [name] MRN: [id number] DOB: Jul 22, 1932   Cancelled Treatment:    Reason Eval/Treat Not Completed: Pain limiting ability to participate. Patient tearful due to pain.  Provided encouragement to ambulate, however, patient declined PT today.  Asked that we return tomorrow.   Vena Austria 09/10/2012, 12:20 PM Durenda Hurt Renaldo Fiddler, [facility] Acute Rehab Services Pager [phone]

## 2012-09-10 NOTE — Progress Notes (Signed)
Subjective:  Co back pain and leg pain/tolerated hd yesterday on schedule Objective Vital signs in last 24 hours: Filed Vitals:   09/09/12 1132 09/09/12 1133 09/09/12 2104 09/10/12 0509  BP: 139/57  121/55 106/56  Pulse: 74  74 76  Temp:   98.3 F (36.8 C) 99.4 F (37.4 C)  TempSrc:   Oral Oral  Resp: 15  16 16   Height:      Weight:  46.2 kg (101 lb 13.6 oz) 46.199 kg (101 lb 13.6 oz)   SpO2:  99% 100% 100%   Weight change: -3.8 kg (-8 lb 6 oz)  Intake/Output Summary (Last 24 hours) at 09/10/12 0827 Last data filed at 09/10/12 0510  Gross per 24 hour  Intake    410 ml  Output    600 ml  Net   -190 ml   Labs: Basic Metabolic Panel:  Recent Labs Lab 09/06/12 0440 09/08/12 0733 09/09/12 0730  NA 136 135 136  K 4.4 4.1 5.7*  CL 94* 94* 99  CO2 29 24 22   GLUCOSE 172* 107* 98  BUN 31* 48* 21  CREATININE 9.16* 12.67* 6.89*  CALCIUM 9.1 9.3 8.4  PHOS  --  4.6  --     Recent Labs Lab 09/08/12 0733  ALBUMIN 2.8*   CBC:  Recent Labs Lab 09/05/12 0833 09/06/12 0440 09/08/12 0733 09/09/12 0730  WBC 5.9 4.4 5.7 6.1  NEUTROABS 3.2  --   --   --   HGB 12.4 12.5 11.8* 11.6*  HCT 37.2 38.5 34.3* 33.0*  MCV 102.2* 103.8* 99.4 98.5  PLT 352 314 296 346   CBG:  Recent Labs Lab 09/08/12 1437 09/08/12 1725  GLUCAP 150* 121*    Iron Studies: No results found for this basename: IRON, TIBC, TRANSFERRIN, FERRITIN,  in the last 72 hours Studies/Results: No results found. Medications:   . amLODipine  10 mg Oral QHS  . calcium acetate  1,334 mg Oral TID WC  . ceFEPime (MAXIPIME) IV  2 g Intravenous Q T,Th,Sat-1800  . docusate sodium  100 mg Oral BID  . doxercalciferol  1 mcg Intravenous Q T,Th,Sa-HD  . feeding supplement (NEPRO CARB STEADY)  237 mL Oral BID BM  . heparin  5,000 Units Subcutaneous Q8H  .  HYDROmorphone (DILAUDID) injection  1 mg Intravenous TID  . levothyroxine  150 mcg Oral QAC breakfast  . lisinopril  40 mg Oral QHS  . multivitamin  1 tablet  Oral QHS  . polyethylene glycol  17 g Oral BID  . sodium chloride  3 mL Intravenous Q12H  . vancomycin  500 mg Intravenous Q T,Th,Sa-HD   Physical Exam: General: withdrawn, NAD Heart: RRR, no rub or mur.  Lungs: CTA bilat. Abdomen: BS +, soft, nontender Extremities: Dialysis Access: no LE edema, Pos. Bruit R FA AVGG   Dialysis Orders ([facility] TTS)  F160   4hr     2.25Ca      EDW 52kg     RFA AVG      Heparin 6000  Hectorol 1ug, no Epo - last dose 9000 6/14 at op center,on  venofer 50/week Labs: Hgb 13.3 Feb109 and 61%sat 6/28,  - iPTH 196 in April   Assessment 1 Discitis / psoas abcess- aspirate cx's neg,on Vanco and Maxipime   per ID  2 CKD, hd TTS  3 HTN-  BP's low, looks dry on exam, below EDW 4 Anemia- Hb 11.6, no epo  5 Sec HPT- cont vit D, pth in  range,last checked April ,check while in hosp. phos 4.6, not on binder  6 Hypothyroidism- synthroid 150mg ,TSH ^ on admit/ compliance questionable   7 Hx pe  8. PVD  Lenny Pastel, PA-C [facility] Beeper [phone] 09/10/2012,8:27 AM  LOS: 5 days   Patient seen and examined.  I agree with plan as above with additions as indicated.  BP's low, and looks dry on exam.. Will d/c norvasc, decrease acei, minimal UF next HD if poor intake continues Vinson Moselle  MD Pager [phone]    Cell  [phone] 09/10/2012, 2:53 PM

## 2012-09-10 NOTE — Progress Notes (Addendum)
Subjective: Ms. Mehlman continues to appear depressed.  She complains of 8-9/10 pain of her lower back going into her left thigh.  Yesterday, when she complained of pain, we changed her regimen to scheduled because she was not maximizing her pain medications and seemingly not asking for it.  RN informed me that yesterday she was refusing the scheduled pain medication, and it seems that she received 3mg  all day yesterday.  She also vomited overnight, requiring phenergan, unclear if this was related to pain or pain medication.  She had 2 BMs this morning.  Objective: Vital signs in last 24 hours: Filed Vitals:   09/09/12 1133 09/09/12 2104 09/10/12 0509 09/10/12 1011  BP:  121/55 106/56 119/65  Pulse:  74 76 75  Temp:  98.3 F (36.8 C) 99.4 F (37.4 C) 99 F (37.2 C)  TempSrc:  Oral Oral Oral  Resp:  16 16 16   Height:      Weight: 101 lb 13.6 oz (46.2 kg) 101 lb 13.6 oz (46.199 kg)    SpO2: 99% 100% 100% 99%   Weight change: -8 lb 6 oz (-3.8 kg)  Intake/Output Summary (Last 24 hours) at 09/10/12 1031 Last data filed at 09/10/12 0900  Gross per 24 hour  Intake    410 ml  Output    600 ml  Net   -190 ml   General: resting in bed, appears depressed HEENT: PERRL, EOMI Cardiac: RRR, no rubs, murmurs or gallops Pulm: clear to auscultation bilaterally, moving normal volumes of air Abd: soft, nontender, nondistended, BS present Ext: warm and well perfused, no pedal edema; no tenderness to left thigh Neuro: alert and oriented X3  Lab Results: Basic Metabolic Panel:  Recent Labs Lab 09/08/12 0733 09/09/12 0730 09/10/12 0906  NA 135 136 138  K 4.1 5.7* 3.7  CL 94* 99 95*  CO2 24 22 29   GLUCOSE 107* 98 113*  BUN 48* 21 19  CREATININE 12.67* 6.89* 5.69*  CALCIUM 9.3 8.4 9.6  PHOS 4.6  --   --    Liver Function Tests:  Recent Labs Lab 09/08/12 0733  ALBUMIN 2.8*   CBC:  Recent Labs Lab 09/05/12 0833  09/08/12 0733 09/09/12 0730  WBC 5.9  < > 5.7 6.1  NEUTROABS  3.2  --   --   --   HGB 12.4  < > 11.8* 11.6*  HCT 37.2  < > 34.3* 33.0*  MCV 102.2*  < > 99.4 98.5  PLT 352  < > 296 346  < > = values in this interval not displayed. CBG:  Recent Labs Lab 09/08/12 1437 09/08/12 1725  GLUCAP 150* 121*   Thyroid Function Tests:  Recent Labs Lab 09/05/12 1524  TSH 114.710*    Micro Results: Recent Results (from the past 240 hour(s))  CULTURE, BLOOD (ROUTINE X 2)     Status: None   Collection Time    09/05/12  1:00 PM      Result Value Range Status   Specimen Description BLOOD HAND LEFT   Final   Special Requests BOTTLES DRAWN AEROBIC AND ANAEROBIC 4CC   Final   Culture  Setup Time 09/05/2012 16:21   Final   Culture     Final   Value: STAPHYLOCOCCUS SPECIES (COAGULASE NEGATIVE)     Note: THE SIGNIFICANCE OF ISOLATING THIS ORGANISM FROM A SINGLE SET OF BLOOD CULTURES WHEN MULTIPLE SETS ARE DRAWN IS UNCERTAIN. PLEASE NOTIFY THE MICROBIOLOGY DEPARTMENT WITHIN ONE WEEK IF SPECIATION AND SENSITIVITIES ARE  REQUIRED.     Note: Gram Stain Report Called to,Read Back By and Verified With: Marney Setting @ [phone] 09/06/12 BY KRAWS   Report Status 09/07/2012 FINAL   Final  CULTURE, BLOOD (ROUTINE X 2)     Status: None   Collection Time    09/05/12  4:45 PM      Result Value Range Status   Specimen Description BLOOD HEMODIALYSIS LINE   Final   Special Requests BOTTLES DRAWN AEROBIC AND ANAEROBIC 10CC   Final   Culture  Setup Time 09/06/2012 00:16   Final   Culture     Final   Value:        BLOOD CULTURE RECEIVED NO GROWTH TO DATE CULTURE WILL BE HELD FOR 5 DAYS BEFORE ISSUING A FINAL NEGATIVE REPORT   Report Status PENDING   Incomplete  MRSA PCR SCREENING     Status: None   Collection Time    09/05/12  9:48 PM      Result Value Range Status   MRSA by PCR NEGATIVE  NEGATIVE Final   Comment:            The GeneXpert MRSA Assay (FDA     approved for NASAL specimens     only), is one component of a     comprehensive MRSA colonization     surveillance  program. It is not     intended to diagnose MRSA     infection nor to guide or     monitor treatment for     MRSA infections.  GRAM STAIN     Status: None   Collection Time    09/06/12  2:32 PM      Result Value Range Status   Specimen Description ABSCESS BACK   Final   Special Requests DISC SPACE L 2 3   Final   Gram Stain     Final   Value: ABUNDANT WBC PRESENT,BOTH PMN AND MONONUCLEAR     NO ORGANISMS SEEN   Report Status 09/06/2012 FINAL   Final  CULTURE, ROUTINE-ABSCESS     Status: None   Collection Time    09/06/12  2:32 PM      Result Value Range Status   Specimen Description ABSCESS BACK   Final   Special Requests L 2 3 SPACE   Final   Gram Stain     Final   Value: ABUNDANT WBC PRESENT,BOTH PMN AND MONONUCLEAR     NO SQUAMOUS EPITHELIAL CELLS SEEN     NO ORGANISMS SEEN     Performed at [facility]   Culture NO GROWTH 2 DAYS   Final   Report Status PENDING   Incomplete   Medications: I have reviewed the patient's current medications. Scheduled Meds: . amLODipine  10 mg Oral QHS  . calcium acetate  1,334 mg Oral TID WC  . ceFEPime (MAXIPIME) IV  2 g Intravenous Q T,Th,Sat-1800  . docusate sodium  100 mg Oral BID  . doxercalciferol  1 mcg Intravenous Q T,Th,Sa-HD  . feeding supplement (NEPRO CARB STEADY)  237 mL Oral BID BM  . heparin  5,000 Units Subcutaneous Q8H  .  HYDROmorphone (DILAUDID) injection  1 mg Intravenous TID  . levothyroxine  150 mcg Oral QAC breakfast  . lisinopril  40 mg Oral QHS  . multivitamin  1 tablet Oral QHS  . polyethylene glycol  17 g Oral BID  . sodium chloride  3 mL Intravenous Q12H  . vancomycin  500 mg  Intravenous Q T,Th,Sa-HD   Continuous Infusions:  PRN Meds:.HYDROmorphone (DILAUDID) injection  Assessment/Plan: Nicole [name] is a 79 yo F with history of lumbar discitis, ESRD on HD, hypothyroidism, HTN, and depression who was admitted on 09/05/12 with complaints of lumbar back pain.  # Recurrent Osteomyelitis/Discitis of lumbar  spine with B/L Psoas abscess  -MRI revealed progressive changes consistent with recurrent diskitis and osteomyelitis with b/l psoas abscess. No epidural abscess seen on MRI. She was diagnosed with Lumbar discitis and osteomyelitis in January 2014 but aspirated culture showed no growth and she was treated with 6 weeks of Vanc and [place]. CRP 3.7, Sed rate 54. Will monitor for effectiveness of Abx treatment.  -Patient with ESRD on dialysis, port is likely source of bacteremia to cause osteomyelitis  -Initial Blood culture obtained 09/05/12 with 1 blood cx with coag neg staph; 2nd cx pending -CT Aspiration completed 7/2 NGTD from cultures, will consult IR to aspirate one or both psoas abscesses and send for culture--> plan for aspiration on 09/11/12, patient made NPO after midnight - Vanc and Cefepime started 7/2 per pharmacy.  -HIV pending  -Unclear about pain control; attempted to schedule pain meds on 09/09/12 which did not work; have re-emphasized that patient may ask for pain medication, for now have re-ordered dilaudid 1mg  q3h prn pain; if there are any issues, RN may contact on call intern -Bowel regimen given pain mgmt (miralax & docusate BID) -Request that PT/OT attempt to treat, but noted that patient refuses; we may have patient pre-treated with medication prior to PT if she wishes.   # ESRD (end stage renal disease) on dialysis  -Normal Dialysis schedule is T, TH, S. (She follows with Dr. Geanie Berlin, HD at [facility])  -Appreciate renal input   # Hypothyroidism  -Continue home synthroid dose of .  -TSH on 09/05/12 is 114. Unlikely that patient is compliant with home medicatio, but will check free T4   # Anemia in chronic kidney disease(285.21)  -Patient with ESRD, normally hemoglobin runs 8-9 currently hemoglobin is stable ~12. Will continue to monitor.  -No role for EPO at this time  # Severe spinal stenosis at L2-3  -Unchanged compared to previous MRI.  -Pain control with PRN pain  medication   # Accelerated Hypertension-resolved  -Volume control with HD -Continue amlodipine, lisinopril  # PVD (peripheral vascular disease)  -No ulcers noted.   # Bradycardia-at admission; resolved   #Code Status: full code    Dispo: Disposition is deferred at this time, awaiting improvement of current medical problems.  Anticipated discharge in approximately 2-3 day(s).   The patient does have a current PCP [name], MD) and does not need an [facility] hospital follow-up appointment after discharge.  The patient does not have transportation limitations that hinder transportation to clinic appointments.  .Services Needed at time of discharge: Y = Yes, Blank = No PT:   OT:   RN:   Equipment:   Other:     LOS: 5 days   Belia Heman, MD 09/10/2012, 10:31 AM

## 2012-09-11 ENCOUNTER — Inpatient Hospital Stay (HOSPITAL_COMMUNITY): Payer: No Typology Code available for payment source

## 2012-09-11 ENCOUNTER — Encounter (HOSPITAL_COMMUNITY): Payer: Self-pay | Admitting: Radiology

## 2012-09-11 ENCOUNTER — Other Ambulatory Visit (HOSPITAL_COMMUNITY): Payer: No Typology Code available for payment source

## 2012-09-11 LAB — RENAL FUNCTION PANEL
BUN: 29 mg/dL — ABNORMAL HIGH (ref 6–23)
CO2: 29 mEq/L (ref 19–32)
Calcium: 9.6 mg/dL (ref 8.4–10.5)
Creatinine, Ser: 7.51 mg/dL — ABNORMAL HIGH (ref 0.50–1.10)
GFR calc non Af Amer: 5 mL/min — ABNORMAL LOW (ref >90)

## 2012-09-11 LAB — HIV-1 RNA QUANT-NO REFLEX-BLD
HIV 1 RNA Quant: <20 copies/mL (ref <20)
HIV-1 RNA Quant, Log: <1.3 {Log} (ref <1.30)

## 2012-09-11 MED ORDER — MIDAZOLAM HCL 2 MG/2ML IJ SOLN
INTRAMUSCULAR | Status: AC | PRN
  Administered 2012-09-11: 1 mg via INTRAVENOUS

## 2012-09-11 MED ORDER — FENTANYL CITRATE 0.05 MG/ML IJ SOLN
INTRAMUSCULAR | Status: AC | PRN
  Administered 2012-09-11: 50 ug via INTRAVENOUS

## 2012-09-11 MED ORDER — HEPARIN SODIUM (PORCINE) 5000 UNIT/ML IJ SOLN
5000.0000 [IU] | Freq: Three times a day (TID) | INTRAMUSCULAR | Status: DC
  Administered 2012-09-12 – 2012-09-14 (×2): 5000 [IU] via SUBCUTANEOUS
  Filled 2012-09-11 (×15): qty 1

## 2012-09-11 MED ORDER — LIDOCAINE HCL 1 % IJ SOLN
INTRAMUSCULAR | Status: AC
  Filled 2012-09-11: qty 10

## 2012-09-11 NOTE — Progress Notes (Signed)
Patient ID: Nicole [name], female   DOB: 31-Dec-1932, 79 y.o.   MRN: [id number]         [facility]    Date of Admission:  09/05/2012           Day 5 vancomycin        Day 5 cefepime  Principal Problem:   Osteomyelitis of lumbar spine Active Problems:   Discitis of lumbar region   Psoas abscess   Hypertension   ESRD (end stage renal disease) on dialysis   PVD (peripheral vascular disease)   Hypothyroidism   Anemia in chronic kidney disease(285.21)   Unspecified protein-calorie malnutrition  Subjective: She still has severe back pain.  Objective: Temp:  [97.5 F (36.4 C)-98.4 F (36.9 C)] 98.4 F (36.9 C) (07/07 0508) Pulse Rate:  [59-79] 73 (07/07 1235) Resp:  [12-17] 12 (07/07 1235) BP: (106-151)/(44-99) 116/44 mmHg (07/07 1235) SpO2:  [92 %-100 %] 100 % (07/07 1235) Weight:  [45.992 kg (101 lb 6.3 oz)] 45.992 kg (101 lb 6.3 oz) (07/06 2042)  General: She appears uncomfortable  Lab Results Lab Results  Component Value Date   WBC 6.1 09/09/2012   HGB 11.6* 09/09/2012   HCT 33.0* 09/09/2012   MCV 98.5 09/09/2012   PLT 346 09/09/2012    Lab Results  Component Value Date   CREATININE 7.51* 09/11/2012   BUN 29* 09/11/2012   NA 139 09/11/2012   K 4.0 09/11/2012   CL 95* 09/11/2012   CO2 29 09/11/2012    Microbiology: Recent Results (from the past 240 hour(s))  CULTURE, BLOOD (ROUTINE X 2)     Status: None   Collection Time    09/05/12  1:00 PM      Result Value Range Status   Specimen Description BLOOD HAND LEFT   Final   Special Requests BOTTLES DRAWN AEROBIC AND ANAEROBIC 4CC   Final   Culture  Setup Time 09/05/2012 16:21   Final   Culture     Final   Value: STAPHYLOCOCCUS SPECIES (COAGULASE NEGATIVE)     Note: THE SIGNIFICANCE OF ISOLATING THIS ORGANISM FROM A SINGLE SET OF BLOOD CULTURES WHEN MULTIPLE SETS ARE DRAWN IS UNCERTAIN. PLEASE NOTIFY THE MICROBIOLOGY DEPARTMENT WITHIN ONE WEEK IF SPECIATION AND SENSITIVITIES ARE REQUIRED.     Note: Gram  Stain Report Called to,Read Back By and Verified With: ANDREW Delford Field @ [phone] 09/06/12 BY KRAWS   Report Status 09/07/2012 FINAL   Final  CULTURE, BLOOD (ROUTINE X 2)     Status: None   Collection Time    09/05/12  4:45 PM      Result Value Range Status   Specimen Description BLOOD HEMODIALYSIS LINE   Final   Special Requests BOTTLES DRAWN AEROBIC AND ANAEROBIC 10CC   Final   Culture  Setup Time 09/06/2012 00:16   Final   Culture     Final   Value:        BLOOD CULTURE RECEIVED NO GROWTH TO DATE CULTURE WILL BE HELD FOR 5 DAYS BEFORE ISSUING A FINAL NEGATIVE REPORT   Report Status PENDING   Incomplete  MRSA PCR SCREENING     Status: None   Collection Time    09/05/12  9:48 PM      Result Value Range Status   MRSA by PCR NEGATIVE  NEGATIVE Final   Comment:            The GeneXpert MRSA Assay (FDA     approved for  NASAL specimens     only), is one component of a     comprehensive MRSA colonization     surveillance program. It is not     intended to diagnose MRSA     infection nor to guide or     monitor treatment for     MRSA infections.  GRAM STAIN     Status: None   Collection Time    09/06/12  2:32 PM      Result Value Range Status   Specimen Description ABSCESS BACK   Final   Special Requests DISC SPACE L 2 3   Final   Gram Stain     Final   Value: ABUNDANT WBC PRESENT,BOTH PMN AND MONONUCLEAR     NO ORGANISMS SEEN   Report Status 09/06/2012 FINAL   Final  CULTURE, ROUTINE-ABSCESS     Status: None   Collection Time    09/06/12  2:32 PM      Result Value Range Status   Specimen Description ABSCESS BACK   Final   Special Requests L 2 3 SPACE   Final   Gram Stain     Final   Value: ABUNDANT WBC PRESENT,BOTH PMN AND MONONUCLEAR     NO SQUAMOUS EPITHELIAL CELLS SEEN     NO ORGANISMS SEEN     Performed at [facility]   Culture NO GROWTH 3 DAYS   Final   Report Status 09/10/2012 FINAL   Final    Studies/Results: Ct Image Guided Fluid Drain By Catheter  09/11/2012    *RADIOLOGY REPORT*  Clinical Data: Right psoas abscess, lumbar diskitis  CT GUIDED RIGHT PSOAS SMALL FLUID COLLECTION NEEDLE ASPIRATION  Date:  09/11/2012 11:35:00  Radiologist:  Judie Petit. Ruel Favors, M.D.  Medications:  50 mcg Fentanyl, 1 mg Versed  Guidance:  CT  Fluoroscopy time:  None.  Sedation time:  16 minutes  Contrast volume:  None.  Complications:  No immediate  PROCEDURE/FINDINGS:  Informed consent was obtained from the patient following explanation of the procedure, risks, benefits and alternatives. The patient understands, agrees and consents for the procedure. All questions were addressed.  A time out was performed.  Maximal barrier sterile technique utilized including caps, mask, sterile gowns, sterile gloves, large sterile drape, hand hygiene, and betadine  Previous imaging reviewed.  The patient was positioned prone. Noncontrast localization CT performed.  The right iliopsoas fluid collection was localized.  Under sterile conditions and local anesthesia, CT guidance was utilized to advance a 17 gauge 11.8 cm access needle into the collection.  Needle position confirmed with CT.  Syringe aspiration yielded 12 ml brown exudative fluid. Sample sent for Gram stain and culture.  Collection completely decompressed.  Needle removed.  No immediate complication.  The patient tolerated the procedure well.  IMPRESSION: Successful CT guided right psoas abscess needle aspiration   Original Report Authenticated By: Judie Petit. Miles Costain, M.D.    Assessment: She had 12 mL of pus drained from her right psoas abscess. I would continue empiric vancomycin and cefepime pending abscess Gram stain and cultures.  Plan: 1. Continue current antibiotics pending the abscess Gram stain and cultures  Cliffton Asters, MD [facility] [phone] pager   [phone] cell 09/11/2012, 2:23 PM

## 2012-09-11 NOTE — Progress Notes (Signed)
PT Cancellation Note  Patient Details Name: Nicole [name] MRN: [id number] DOB: 01-15-1933   Cancelled Treatment:    Reason Eval/Treat Not Completed: Medical issues which prohibited therapy Pt scheduled now for Rt psoas abscess drain aspiration/drain placement today.    Terrilee Dudzik,[name] 09/11/2012, 10:36 AM Zenovia Jarred, PT, DPT 09/11/2012 Pager: [phone]

## 2012-09-11 NOTE — Progress Notes (Signed)
[facility] Progress Note  Subjective:   Resting in bed. C/o back pain.  Objective Filed Vitals:   09/10/12 1334 09/10/12 1644 09/10/12 2042 09/11/12 0508  BP: 109/50 124/75 131/53 127/67  Pulse: 69 59 68 64  Temp: 98.7 F (37.1 C) 98 F (36.7 C) 98.4 F (36.9 C) 98.4 F (36.9 C)  TempSrc: Oral Oral Oral Oral  Resp: 16 16 16 16   Height:   5\' 3"  (1.6 m)   Weight:   45.992 kg (101 lb 6.3 oz)   SpO2: 100% 92% 100% 95%   Physical Exam General: ill-appearing, NAD Heart: RRR  Lungs: CTA bilaterally, no wheezes, rales, rhonchi noted Abdomen: soft, NT, normal BS Extremities: No LE edema.  Dialysis Access: RFA AVG +bruit  Dialysis Orders [facility] TTS)  F160 4hr 2.25Ca EDW 52kg RFA AVG Heparin 6000  Hectorol 1ug, no Epo - last dose 9000 6/14 at op center,on venofer 50/week  Labs: Hgb 13.3 Feb109 and 61%sat 6/28, - iPTH 196 in April    Assessment/Plan: 1. Discitis/ Psoas abscess - Aspirate cx's negative thus far. U/s guided drainage planned for today. On Vanc and cefepime per ID 2. ESRD - TTS, on schedule, next HD tomorrow. Tight heparin 3. Anemia - Hgb 11.5, No ESA's 4. Secondary hyperparathyroidism - Ca 9.6 (10.5 corrected) Continue low dose Vit D. PTH at goal in April, recheck here. Phos 5.1 - not on binder d/t hx of poor po intake but may need to consider. Low Ca bath. 5. HTN/volume - SBPs 100's-130s on Lisinopril. Norvasc d/c'd. Below EDW here. Minimal UF 6. Nutrition - Albumin 2.8. Poor po intake. NPO pending procedure. Resume high prot renal diet/ nepro when appropriate 7. Hypothyroidism - on synthroid 150 mg, ^TSH on admit/ compliance questionable 8. PVD 9. Hx PE  Scot Jun. Thad Ranger [facility] Pager [phone] 09/11/2012,9:02 AM  LOS: 6 days    Additional Objective Labs: Basic Metabolic Panel:  Recent Labs Lab 09/08/12 0733 09/09/12 0730 09/10/12 0906 09/11/12 0500  NA 135 136 138 139  K 4.1 5.7* 3.7 4.0  CL 94* 99 95* 95*   CO2 24 22 29 29   GLUCOSE 107* 98 113* 97  BUN 48* 21 19 29*  CREATININE 12.67* 6.89* 5.69* 7.51*  CALCIUM 9.3 8.4 9.6 9.6  PHOS 4.6  --   --  5.1*   Liver Function Tests:  Recent Labs Lab 09/08/12 0733 09/11/12 0500  ALBUMIN 2.8* 2.8*   No results found for this basename: LIPASE, AMYLASE,  in the last 168 hours CBC:  Recent Labs Lab 09/05/12 0833 09/06/12 0440 09/08/12 0733 09/09/12 0730  WBC 5.9 4.4 5.7 6.1  NEUTROABS 3.2  --   --   --   HGB 12.4 12.5 11.8* 11.6*  HCT 37.2 38.5 34.3* 33.0*  MCV 102.2* 103.8* 99.4 98.5  PLT 352 314 296 346   Blood Culture    Component Value Date/Time   SDES ABSCESS BACK 09/06/2012 1432   SDES ABSCESS BACK 09/06/2012 1432   SPECREQUEST DISC SPACE L 2 3 09/06/2012 1432   SPECREQUEST L 2 3 SPACE 09/06/2012 1432   CULT NO GROWTH 3 DAYS 09/06/2012 1432   REPTSTATUS 09/06/2012 FINAL 09/06/2012 1432   REPTSTATUS 09/10/2012 FINAL 09/06/2012 1432    Cardiac Enzymes: No results found for this basename: CKTOTAL, CKMB, CKMBINDEX, TROPONINI,  in the last 168 hours CBG:  Recent Labs Lab 09/08/12 1437 09/08/12 1725  GLUCAP 150* 121*   Iron Studies: No results found for this  basename: IRON, TIBC, TRANSFERRIN, FERRITIN,  in the last 72 hours @lablastinr3 @ Studies/Results: No results found. Medications:   . calcium acetate  1,334 mg Oral TID WC  . ceFEPime (MAXIPIME) IV  2 g Intravenous Q T,Th,Sat-1800  . docusate sodium  100 mg Oral BID  . doxercalciferol  1 mcg Intravenous Q T,Th,Sa-HD  . feeding supplement (NEPRO CARB STEADY)  237 mL Oral BID BM  . heparin  5,000 Units Subcutaneous Q8H  . levothyroxine  150 mcg Oral QAC breakfast  . lisinopril  20 mg Oral QHS  . multivitamin  1 tablet Oral QHS  . polyethylene glycol  17 g Oral BID  . sodium chloride  3 mL Intravenous Q12H  . vancomycin  500 mg Intravenous Q T,Th,Sa-HD   Renal Attending: Some mild confusion today as she thought she just finished HD.  Next HD planned for  Tuesday. Chrishun Scheer C

## 2012-09-11 NOTE — Progress Notes (Signed)
Subjective: Patient scheduled for IR drainage of B/L psoas abscess today.  Currently reports 3-4/10 pain located in lower back.  Encourage to tell nurse and request medication if in pain.  Denies any fever, chills, N/V, SOB, chest pain. Objective: Vital signs in last 24 hours: Filed Vitals:   09/11/12 1310 09/11/12 1335 09/11/12 1410 09/11/12 1450  BP: 155/73 145/66 155/70 153/67  Pulse: 116 91 89 62  Temp: 97.5 F (36.4 C) 97.5 F (36.4 C) 97.2 F (36.2 C) 98.2 F (36.8 C)  TempSrc: Oral Oral Oral Oral  Resp: 19 20 20 16   Height:      Weight:      SpO2: 85% 100% 100% 100%   Weight change: -7.3 oz (-0.208 kg)  Intake/Output Summary (Last 24 hours) at 09/11/12 1705 Last data filed at 09/11/12 0900  Gross per 24 hour  Intake     50 ml  Output      0 ml  Net     50 ml   General: resting in bed, appears depressed  HEENT: EOMI  Cardiac: RRR, no rubs, murmurs or gallops  Pulm: clear to auscultation bilaterally, moving normal volumes of air  Abd: soft, nontender, nondistended, BS present  Ext: warm and well perfused, no pedal edema; no tenderness to left thigh  Neuro: alert and oriented X3, lower extremity sensation intact.  5/5 muscle strength in LLE, 5/5 muscle strength in RLE but flexion causes pain and limits exam.  Lab Results: Basic Metabolic Panel:  Recent Labs Lab 09/08/12 0733  09/10/12 0906 09/11/12 0500  NA 135  < > 138 139  K 4.1  < > 3.7 4.0  CL 94*  < > 95* 95*  CO2 24  < > 29 29  GLUCOSE 107*  < > 113* 97  BUN 48*  < > 19 29*  CREATININE 12.67*  < > 5.69* 7.51*  CALCIUM 9.3  < > 9.6 9.6  PHOS 4.6  --   --  5.1*  < > = values in this interval not displayed. Liver Function Tests:  Recent Labs Lab 09/08/12 0733 09/11/12 0500  ALBUMIN 2.8* 2.8*  CBC:  Recent Labs Lab 09/05/12 0833  09/08/12 0733 09/09/12 0730  WBC 5.9  < > 5.7 6.1  NEUTROABS 3.2  --   --   --   HGB 12.4  < > 11.8* 11.6*  HCT 37.2  < > 34.3* 33.0*  MCV 102.2*  < > 99.4  98.5  PLT 352  < > 296 346  < > = values in this interval not displayed. CBG:  Recent Labs Lab 09/08/12 1437 09/08/12 1725  GLUCAP 150* 121*   Thyroid Function Tests:  Recent Labs Lab 09/05/12 1524 09/11/12 0500  TSH 114.710*  --   FREET4  --  1.02   Coagulation:  Recent Labs Lab 09/06/12 0440  LABPROT 12.7  INR 0.97    Micro Results: Recent Results (from the past 240 hour(s))  CULTURE, BLOOD (ROUTINE X 2)     Status: None   Collection Time    09/05/12  1:00 PM      Result Value Range Status   Specimen Description BLOOD HAND LEFT   Final   Special Requests BOTTLES DRAWN AEROBIC AND ANAEROBIC 4CC   Final   Culture  Setup Time 09/05/2012 16:21   Final   Culture     Final   Value: STAPHYLOCOCCUS SPECIES (COAGULASE NEGATIVE)     Note: THE SIGNIFICANCE OF ISOLATING THIS  ORGANISM FROM A SINGLE SET OF BLOOD CULTURES WHEN MULTIPLE SETS ARE DRAWN IS UNCERTAIN. PLEASE NOTIFY THE MICROBIOLOGY DEPARTMENT WITHIN ONE WEEK IF SPECIATION AND SENSITIVITIES ARE REQUIRED.     Note: Gram Stain Report Called to,Read Back By and Verified With: Marney Setting @ [phone] 09/06/12 BY KRAWS   Report Status 09/07/2012 FINAL   Final  CULTURE, BLOOD (ROUTINE X 2)     Status: None   Collection Time    09/05/12  4:45 PM      Result Value Range Status   Specimen Description BLOOD HEMODIALYSIS LINE   Final   Special Requests BOTTLES DRAWN AEROBIC AND ANAEROBIC 10CC   Final   Culture  Setup Time 09/06/2012 00:16   Final   Culture     Final   Value:        BLOOD CULTURE RECEIVED NO GROWTH TO DATE CULTURE WILL BE HELD FOR 5 DAYS BEFORE ISSUING A FINAL NEGATIVE REPORT   Report Status PENDING   Incomplete  MRSA PCR SCREENING     Status: None   Collection Time    09/05/12  9:48 PM      Result Value Range Status   MRSA by PCR NEGATIVE  NEGATIVE Final   Comment:            The GeneXpert MRSA Assay (FDA     approved for NASAL specimens     only), is one component of a     comprehensive MRSA colonization      surveillance program. It is not     intended to diagnose MRSA     infection nor to guide or     monitor treatment for     MRSA infections.  GRAM STAIN     Status: None   Collection Time    09/06/12  2:32 PM      Result Value Range Status   Specimen Description ABSCESS BACK   Final   Special Requests DISC SPACE L 2 3   Final   Gram Stain     Final   Value: ABUNDANT WBC PRESENT,BOTH PMN AND MONONUCLEAR     NO ORGANISMS SEEN   Report Status 09/06/2012 FINAL   Final  CULTURE, ROUTINE-ABSCESS     Status: None   Collection Time    09/06/12  2:32 PM      Result Value Range Status   Specimen Description ABSCESS BACK   Final   Special Requests L 2 3 SPACE   Final   Gram Stain     Final   Value: ABUNDANT WBC PRESENT,BOTH PMN AND MONONUCLEAR     NO SQUAMOUS EPITHELIAL CELLS SEEN     NO ORGANISMS SEEN     Performed at [facility]   Culture NO GROWTH 3 DAYS   Final   Report Status 09/10/2012 FINAL   Final   Studies/Results: Ct Image Guided Fluid Drain By Catheter  09/11/2012   *RADIOLOGY REPORT*  Clinical Data: Right psoas abscess, lumbar diskitis  CT GUIDED RIGHT PSOAS SMALL FLUID COLLECTION NEEDLE ASPIRATION  Date:  09/11/2012 11:35:00  Radiologist:  Judie Petit. Ruel Favors, M.D.  Medications:  50 mcg Fentanyl, 1 mg Versed  Guidance:  CT  Fluoroscopy time:  None.  Sedation time:  16 minutes  Contrast volume:  None.  Complications:  No immediate  PROCEDURE/FINDINGS:  Informed consent was obtained from the patient following explanation of the procedure, risks, benefits and alternatives. The patient understands, agrees and consents for the procedure. All questions  were addressed.  A time out was performed.  Maximal barrier sterile technique utilized including caps, mask, sterile gowns, sterile gloves, large sterile drape, hand hygiene, and betadine  Previous imaging reviewed.  The patient was positioned prone. Noncontrast localization CT performed.  The right iliopsoas fluid collection was  localized.  Under sterile conditions and local anesthesia, CT guidance was utilized to advance a 17 gauge 11.8 cm access needle into the collection.  Needle position confirmed with CT.  Syringe aspiration yielded 12 ml brown exudative fluid. Sample sent for Gram stain and culture.  Collection completely decompressed.  Needle removed.  No immediate complication.  The patient tolerated the procedure well.  IMPRESSION: Successful CT guided right psoas abscess needle aspiration   Original Report Authenticated By: Judie Petit. Miles Costain, M.D.   Medications: I have reviewed the patient's current medications. Scheduled Meds: . calcium acetate  1,334 mg Oral TID WC  . ceFEPime (MAXIPIME) IV  2 g Intravenous Q T,Th,Sat-1800  . docusate sodium  100 mg Oral BID  . doxercalciferol  1 mcg Intravenous Q T,Th,Sa-HD  . feeding supplement (NEPRO CARB STEADY)  237 mL Oral BID BM  . heparin  5,000 Units Subcutaneous Q8H  . levothyroxine  150 mcg Oral QAC breakfast  . lisinopril  20 mg Oral QHS  . multivitamin  1 tablet Oral QHS  . polyethylene glycol  17 g Oral BID  . sodium chloride  3 mL Intravenous Q12H  . vancomycin  500 mg Intravenous Q T,Th,Sa-HD   Continuous Infusions: none PRN Meds:.HYDROmorphone (DILAUDID) injection Assessment/Plan: Osteomyelitis/Discitis of lumbar spine with B/L Psoas abscess  -MRI revealed progressive changes consistent with recurrent diskitis and osteomyelitis with b/l psoas abscess. No epidural abscess seen on MRI. She was diagnosed with Lumbar discitis and osteomyelitis in January 2014 but aspirated culture showed no growth and she was treated with 6 weeks of Vanc and [place].  -Patient with ESRD on dialysis, port is likely source of bacteremia to cause osteomyelitis  -Initial Blood culture obtained 09/05/12 1/2 sets has + gram stain showing coag neg negative staph, possible contaminate. -CT Aspiration completed 7/2 NGTD from cultures -IR aspirated R psoas abscess draining 12ml.  Cx are  pending - Vanc and Cefepime started 7/2 per pharmacy.  -HIV neg -Pain control with 1mg  dilaudid q3prn.  Patient in pain frequently but not always asking for medication.  Encouraged patient to ask if in pain, informed nurse to ask about pain and pain meds frequently. -PT/OT continue to treat, patient encouraged to request pain medication if pain prohibits participation in PT/OT. Patient may need to go to SNF after discharge.  -CRP 3.7, Sed rate 54. Will monitor for effectiveness of Abx treatment.  Active Problems:  ESRD (end stage renal disease) on dialysis  -Normal Dialysis schedule is T, TH, S. (She follows with Dr. Geanie Berlin, HD at [facility]) Cont normal schedule.  Hypothyroidism  -Continue home synthroid dose of .  -TSH on 09/05/12 is 114. Unlikely that patient is compliant with home medication.  Anemia in chronic kidney disease(285.21)  -Patient with ESRD, normally hemoglobin runs 8-9 currently hemoglobin is 12.4 before HD. Will continue to monitor.  -Hgb stable 7/5 was 11.6, no need for EPO.  Severe spinal stenosis at L2-3  -Unchanged compared to previous MRI.  -Pain control with PRN pain medication  Accelerated Hypertension-resolved  -Remains elevated, home HTN meds resumed. Continue Dialysis  -Continue to monitor.  PVD (peripheral vascular disease)  -No ulcers noted.  Bradycardia-resolved  #Code Status: full code  Dispo:  Disposition is deferred at this time, awaiting improvement of current medical problems. Anticipated discharge in approximately 1-2 day(s).  The patient does have a current PCP [name], MD) and does not need an [facility] hospital follow-up appointment after discharge.  The patient does not have transportation limitations that hinder transportation to clinic appointments.   .Services Needed at time of discharge: Y = Yes, Blank = No PT:   OT:   RN:   Equipment:   Other:     LOS: 6 days   Carlynn Purl, DO 09/11/2012, 5:05 PM

## 2012-09-11 NOTE — Progress Notes (Signed)
OT Cancellation Note  Patient Details Name: Nicole [name] MRN: [id number] DOB: Mar 04, 1933   Cancelled Treatment:    Reason Eval/Treat Not Completed: Patient at procedure or test/ unavailable(radiology). Will re attempt later today as time allows  Galen Manila 09/11/2012, 12:12 PM

## 2012-09-11 NOTE — H&P (Signed)
Nicole [name] is an 79 y.o. female.   Chief Complaint: Back and rt leg pain Recurrent diskitis L2-3 dis aspiration last week- neg B psoas abscess per imaging Scheduled now for Rt psoas abscess drain aspiration/drain placement HPI: ESRD; HTN; PVD; IDA; AAA  Past Medical History  Diagnosis Date  . Hypertension   . ESRD (end stage renal disease)   . Peripheral vascular disease     revascularization appx. 14 years ago  . Hypothyroidism   . Hiatal hernia   . Iron deficiency anemia   . Osteoarthritis   . Depression with anxiety   . Pulmonary embolism   . AAA (abdominal aortic aneurysm) 1999    aortic byfem bypass    Past Surgical History  Procedure Laterality Date  . Abdominal surgery  about 14 years ago    REVASCULARIZATION  . Ateriovenous graft  04/18/2009    LEFT FOREARM avg  . Av fistula placement, radiocephalic  01/09/10  . Avgg removal  12/02/09    SECONDARY TO INFECTION LT FOREARM  . Av fistula placement, radiocephalic  05/08/10    NONMATURED  . Arteriovenous graft placement  07/24/10    RIGHT FOREARM LOOP  . Dg av dialysis graft declot or  12/05/10, 6/17/123, 09/24/11, 01/26/12    CLOTTED GRAFT  . Angioplasty  12/21/11    of RUE AVG    History reviewed. No pertinent family history. Social History:  reports that she quit smoking about 8 months ago. She does not have any smokeless tobacco history on file. She reports that she does not drink alcohol or use illicit drugs.  Allergies: No Known Allergies  Medications Prior to Admission  Medication Sig Dispense Refill  . amLODipine (NORVASC) 10 MG tablet Take 10 mg by mouth at bedtime.      Marland Kitchen dextrose 5 % SOLN 50 mL with cefTAZidime 2 G SOLR 2 g Inject 2 g into the vein 3 (three) times a week.      Marland Kitchen HYDROcodone-acetaminophen (NORCO/VICODIN) 5-325 MG per tablet Take 1 tablet by mouth every 6 (six) hours as needed for pain.  30 tablet  0  . ibuprofen (ADVIL,MOTRIN) 400 MG tablet Take 1 tablet (400 mg total) by mouth every 6  (six) hours as needed for pain.  30 tablet  0  . levothyroxine (SYNTHROID, LEVOTHROID) 150 MCG tablet Take 1 tablet (150 mcg total) by mouth daily before breakfast.      . lisinopril (PRINIVIL,ZESTRIL) 40 MG tablet Take 40 mg by mouth at bedtime.      . meloxicam (MOBIC) 15 MG tablet Take 1 tablet (15 mg total) by mouth daily.  30 tablet  2  . multivitamin (RENA-VIT) TABS tablet Take 1 tablet by mouth at bedtime.      . feeding supplement (RESOURCE BREEZE) LIQD Take 1 Container by mouth 3 (three) times daily between meals.        Results for orders placed during the hospital encounter of 09/05/12 (from the past 48 hour(s))  BASIC METABOLIC PANEL     Status: Abnormal   Collection Time    09/10/12  9:06 AM      Result Value Range   Sodium 138  135 - 145 mEq/L   Potassium 3.7  3.5 - 5.1 mEq/L   Comment: DELTA CHECK NOTED   Chloride 95 (*) 96 - 112 mEq/L   CO2 29  19 - 32 mEq/L   Glucose, Bld 113 (*) 70 - 99 mg/dL   BUN 19  6 -  23 mg/dL   Creatinine, Ser 1.61 (*) 0.50 - 1.10 mg/dL   Calcium 9.6  8.4 - 09.6 mg/dL   GFR calc non Af Amer 6 (*) >90 mL/min   GFR calc Af Amer 7 (*) >90 mL/min   Comment:            The eGFR has been calculated     using the CKD EPI equation.     This calculation has not been     validated in all clinical     situations.     eGFR's persistently     <90 mL/min signify     possible Chronic Kidney Disease.  RENAL FUNCTION PANEL     Status: Abnormal   Collection Time    09/11/12  5:00 AM      Result Value Range   Sodium 139  135 - 145 mEq/L   Potassium 4.0  3.5 - 5.1 mEq/L   Chloride 95 (*) 96 - 112 mEq/L   CO2 29  19 - 32 mEq/L   Glucose, Bld 97  70 - 99 mg/dL   BUN 29 (*) 6 - 23 mg/dL   Creatinine, Ser 0.45 (*) 0.50 - 1.10 mg/dL   Calcium 9.6  8.4 - 40.9 mg/dL   Phosphorus 5.1 (*) 2.3 - 4.6 mg/dL   Albumin 2.8 (*) 3.5 - 5.2 g/dL   GFR calc non Af Amer 5 (*) >90 mL/min   GFR calc Af Amer 5 (*) >90 mL/min   Comment:            The eGFR has been  calculated     using the CKD EPI equation.     This calculation has not been     validated in all clinical     situations.     eGFR's persistently     <90 mL/min signify     possible Chronic Kidney Disease.   No results found.  Review of Systems  Constitutional: Negative for fever.  Respiratory: Negative for shortness of breath.   Cardiovascular: Negative for chest pain.  Gastrointestinal: Negative for nausea, vomiting and abdominal pain.  Musculoskeletal: Positive for back pain and joint pain.  Neurological: Positive for weakness and headaches.  Psychiatric/Behavioral: The patient is nervous/anxious.     Blood pressure 127/67, pulse 64, temperature 98.4 F (36.9 C), temperature source Oral, resp. rate 16, height 5\' 3"  (1.6 m), weight 101 lb 6.3 oz (45.992 kg), SpO2 95.00%. Physical Exam  Constitutional: She is oriented to person, place, and time. She appears well-developed.  Cardiovascular: Normal rate, regular rhythm and normal heart sounds.   No murmur heard. Respiratory: Effort normal. She has wheezes.  GI: Soft. Bowel sounds are normal. There is no tenderness.  Musculoskeletal: Normal range of motion.  Back and rt leg pain  Neurological: She is alert and oriented to person, place, and time.  Skin: Skin is warm and dry.  Psychiatric: She has a normal mood and affect. Her behavior is normal. Judgment and thought content normal.     Assessment/Plan Continued back and rt leg pain Recurrent discitis L2-3 aspiration 7/2 neg Scheduled now for Rt psoas abscess aspiration/drain placement Pt aware of procedure benefits and risks and agreeable to proceed Consent signed and in chart  Edelmiro Innocent A 09/11/2012, 8:55 AM

## 2012-09-11 NOTE — Progress Notes (Signed)
  Date: 09/11/2012  Patient name: Nicole [name]  Medical record number: [id number]  Date of birth: [birth year]   This patient has been seen and the plan of care was discussed with the house staff. Please see their note for complete details. I concur with their findings with the following additions/corrections:  States increased pain today, but she is not asking for her PRN IV pain medications. Will have team discuss this with RN. She is s/p drainage of right psoas abscess. Pending culture data. For now, will continue Vanc and Cefepime.   Jonah Blue, DO 09/11/2012, 3:21 PM

## 2012-09-11 NOTE — Procedures (Signed)
Successful CT GUIDED RT PSOAS ABSCESS ASPIRATION 10CC EXUDATIVE FLD ASPIRATED COLLECTION COMPLETELY DECOMPRESSED NO COMP GS/CX SENT NO COMP FULL REPORT IN PACS

## 2012-09-12 DIAGNOSIS — I10 Essential (primary) hypertension: Secondary | ICD-10-CM

## 2012-09-12 LAB — RENAL FUNCTION PANEL
CO2: 28 mEq/L (ref 19–32)
Chloride: 90 mEq/L — ABNORMAL LOW (ref 96–112)
Creatinine, Ser: 10.64 mg/dL — ABNORMAL HIGH (ref 0.50–1.10)
GFR calc Af Amer: 3 mL/min — ABNORMAL LOW (ref >90)
GFR calc non Af Amer: 3 mL/min — ABNORMAL LOW (ref >90)
Sodium: 136 mEq/L (ref 135–145)

## 2012-09-12 LAB — CULTURE, BLOOD (ROUTINE X 2): Culture: NO GROWTH

## 2012-09-12 LAB — PARATHYROID HORMONE, INTACT (NO CA): PTH: 87 pg/mL — ABNORMAL HIGH (ref 14.0–72.0)

## 2012-09-12 LAB — CBC
Hemoglobin: 11.2 g/dL — ABNORMAL LOW (ref 12.0–15.0)
RBC: 3.29 MIL/uL — ABNORMAL LOW (ref 3.87–5.11)
WBC: 8.2 10*3/uL (ref 4.0–10.5)

## 2012-09-12 MED ORDER — DOXERCALCIFEROL 4 MCG/2ML IV SOLN
INTRAVENOUS | Status: AC
  Filled 2012-09-12: qty 2

## 2012-09-12 MED ORDER — HYDROCODONE-ACETAMINOPHEN 10-325 MG PO TABS
1.0000 | ORAL_TABLET | ORAL | Status: DC | PRN
  Administered 2012-09-12 – 2012-09-15 (×12): 1 via ORAL
  Filled 2012-09-12 (×12): qty 1

## 2012-09-12 NOTE — Progress Notes (Signed)
Subjective: Patient remains depressed.  Reports pain is better controlled today after CT drainage of R psoas abscess yesterday.  Patient reports that that she has been meaning to bring up some shaky movement of her right hand that has been ongoing for the past 1-3 weeks. She reports that it does not always happen but it seems to happen more often when she is nervous or anxious.  She reports that she has not brought it up to her PCP yet.  She reports no other abnormalities Objective: Vital signs in last 24 hours: Filed Vitals:   09/12/12 1600 09/12/12 1630 09/12/12 1632 09/12/12 1645  BP: 98/71 169/56 153/38 157/65  Pulse: 94 96 63 68  Temp:   98.7 F (37.1 C)   TempSrc:   Oral   Resp: 20 24 26 27   Height:      Weight:      SpO2:   94%    Weight change: 0 lb (0 kg)  Intake/Output Summary (Last 24 hours) at 09/12/12 1702 Last data filed at 09/12/12 1632  Gross per 24 hour  Intake    600 ml  Output    995 ml  Net   -395 ml   General: resting in bed, appears depressed  HEENT: EOMI  Cardiac: RRR, no rubs, murmurs or gallops  Pulm: clear to auscultation bilaterally, moving normal volumes of air  Abd: soft, nontender, nondistended, BS present  Ext: warm and well perfused, no pedal edema; no tenderness to left thigh  Neuro: alert and oriented X2, lower extremity sensation intact. 5/5 muscle strength in LLE, 5/5 muscle strength in RLE, able to hold both legs off the bed without assistance.  No abnormal or tremor movements noticed in RUE or LUE.  Lab Results: Basic Metabolic Panel:  Recent Labs Lab 09/11/12 0500 09/12/12 1257  NA 139 136  K 4.0 3.4*  CL 95* 90*  CO2 29 28  GLUCOSE 97 120*  BUN 29* 43*  CREATININE 7.51* 10.64*  CALCIUM 9.6 9.6  PHOS 5.1* 4.6   Liver Function Tests:  Recent Labs Lab 09/11/12 0500 09/12/12 1257  ALBUMIN 2.8* 2.8*   CBC:  Recent Labs Lab 09/09/12 0730 09/12/12 1257  WBC 6.1 8.2  HGB 11.6* 11.2*  HCT 33.0* 32.9*  MCV 98.5 100.0   PLT 346 246   CBG:  Recent Labs Lab 09/08/12 1437 09/08/12 1725  GLUCAP 150* 121*   Thyroid Function Tests:  Recent Labs Lab 09/11/12 0500  FREET4 1.02   Coagulation:  Recent Labs Lab 09/06/12 0440  LABPROT 12.7  INR 0.97    Micro Results: Recent Results (from the past 240 hour(s))  CULTURE, BLOOD (ROUTINE X 2)     Status: None   Collection Time    09/05/12  1:00 PM      Result Value Range Status   Specimen Description BLOOD HAND LEFT   Final   Special Requests BOTTLES DRAWN AEROBIC AND ANAEROBIC 4CC   Final   Culture  Setup Time 09/05/2012 16:21   Final   Culture     Final   Value: STAPHYLOCOCCUS SPECIES (COAGULASE NEGATIVE)     Note: THE SIGNIFICANCE OF ISOLATING THIS ORGANISM FROM A SINGLE SET OF BLOOD CULTURES WHEN MULTIPLE SETS ARE DRAWN IS UNCERTAIN. PLEASE NOTIFY THE MICROBIOLOGY DEPARTMENT WITHIN ONE WEEK IF SPECIATION AND SENSITIVITIES ARE REQUIRED.     Note: Gram Stain Report Called to,Read Back By and Verified With: Marney Setting @ [phone] 09/06/12 BY KRAWS   Report Status  09/07/2012 FINAL   Final  CULTURE, BLOOD (ROUTINE X 2)     Status: None   Collection Time    09/05/12  4:45 PM      Result Value Range Status   Specimen Description BLOOD HEMODIALYSIS LINE   Final   Special Requests BOTTLES DRAWN AEROBIC AND ANAEROBIC 10CC   Final   Culture  Setup Time 09/06/2012 00:16   Final   Culture NO GROWTH 5 DAYS   Final   Report Status 09/12/2012 FINAL   Final  MRSA PCR SCREENING     Status: None   Collection Time    09/05/12  9:48 PM      Result Value Range Status   MRSA by PCR NEGATIVE  NEGATIVE Final   Comment:            The GeneXpert MRSA Assay (FDA     approved for NASAL specimens     only), is one component of a     comprehensive MRSA colonization     surveillance program. It is not     intended to diagnose MRSA     infection nor to guide or     monitor treatment for     MRSA infections.  GRAM STAIN     Status: None   Collection Time     09/06/12  2:32 PM      Result Value Range Status   Specimen Description ABSCESS BACK   Final   Special Requests DISC SPACE L 2 3   Final   Gram Stain     Final   Value: ABUNDANT WBC PRESENT,BOTH PMN AND MONONUCLEAR     NO ORGANISMS SEEN   Report Status 09/06/2012 FINAL   Final  CULTURE, ROUTINE-ABSCESS     Status: None   Collection Time    09/06/12  2:32 PM      Result Value Range Status   Specimen Description ABSCESS BACK   Final   Special Requests L 2 3 SPACE   Final   Gram Stain     Final   Value: ABUNDANT WBC PRESENT,BOTH PMN AND MONONUCLEAR     NO SQUAMOUS EPITHELIAL CELLS SEEN     NO ORGANISMS SEEN     Performed at [facility]   Culture NO GROWTH 3 DAYS   Final   Report Status 09/10/2012 FINAL   Final  CULTURE, ROUTINE-ABSCESS     Status: None   Collection Time    09/11/12 12:55 PM      Result Value Range Status   Specimen Description ABSCESS OTHER   Final   Special Requests RIGHT PSOAS ABSCESS   Final   Gram Stain     Final   Value: MODERATE WBC PRESENT,BOTH PMN AND MONONUCLEAR     NO SQUAMOUS EPITHELIAL CELLS SEEN     NO ORGANISMS SEEN   Culture PENDING   Incomplete   Report Status PENDING   Incomplete  ANAEROBIC CULTURE     Status: None   Collection Time    09/11/12 12:56 PM      Result Value Range Status   Specimen Description ABSCESS OTHER   Final   Special Requests RIGHT PSOAS ABSCESS   Final   Gram Stain     Final   Value: MODERATE WBC PRESENT,BOTH PMN AND MONONUCLEAR     NO SQUAMOUS EPITHELIAL CELLS SEEN     NO ORGANISMS SEEN   Culture     Final   Value: NO ANAEROBES ISOLATED; CULTURE  IN PROGRESS FOR 5 DAYS   Report Status PENDING   Incomplete   Studies/Results: Ct Image Guided Fluid Drain By Catheter  09/11/2012   *RADIOLOGY REPORT*  Clinical Data: Right psoas abscess, lumbar diskitis  CT GUIDED RIGHT PSOAS SMALL FLUID COLLECTION NEEDLE ASPIRATION  Date:  09/11/2012 11:35:00  Radiologist:  M. Ruel Favors, M.D.  Medications:  50 mcg Fentanyl, 1 mg  Versed  Guidance:  CT  Fluoroscopy time:  None.  Sedation time:  16 minutes  Contrast volume:  None.  Complications:  No immediate  PROCEDURE/FINDINGS:  Informed consent was obtained from the patient following explanation of the procedure, risks, benefits and alternatives. The patient understands, agrees and consents for the procedure. All questions were addressed.  A time out was performed.  Maximal barrier sterile technique utilized including caps, mask, sterile gowns, sterile gloves, large sterile drape, hand hygiene, and betadine  Previous imaging reviewed.  The patient was positioned prone. Noncontrast localization CT performed.  The right iliopsoas fluid collection was localized.  Under sterile conditions and local anesthesia, CT guidance was utilized to advance a 17 gauge 11.8 cm access needle into the collection.  Needle position confirmed with CT.  Syringe aspiration yielded 12 ml brown exudative fluid. Sample sent for Gram stain and culture.  Collection completely decompressed.  Needle removed.  No immediate complication.  The patient tolerated the procedure well.  IMPRESSION: Successful CT guided right psoas abscess needle aspiration   Original Report Authenticated By: Judie Petit. Miles Costain, M.D.   Medications: I have reviewed the patient's current medications. Scheduled Meds: . calcium acetate  1,334 mg Oral TID WC  . ceFEPime (MAXIPIME) IV  2 g Intravenous Q T,Th,Sat-1800  . docusate sodium  100 mg Oral BID  . doxercalciferol      . doxercalciferol  1 mcg Intravenous Q T,Th,Sa-HD  . feeding supplement (NEPRO CARB STEADY)  237 mL Oral BID BM  . heparin  5,000 Units Subcutaneous Q8H  . levothyroxine  150 mcg Oral QAC breakfast  . lisinopril  20 mg Oral QHS  . multivitamin  1 tablet Oral QHS  . polyethylene glycol  17 g Oral BID  . sodium chloride  3 mL Intravenous Q12H  . 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 No epidural abscess seen on MRI. She was diagnosed with Lumbar discitis and osteomyelitis in January 2014 but aspirated culture showed no growth and she was treated with 6 weeks of Vanc and [place].  -Patient with ESRD on dialysis, port is likely source of bacteremia to cause osteomyelitis  -Initial Blood culture obtained 09/05/12 1/2 sets has + gram stain showing coag neg negative staph, possible contaminate.  -CT Aspiration completed 7/2 NGTD from cultures  -IR aspirated R psoas abscess draining 12ml. Cx NGTD, no organisms seen on gram stain. - Vanc and Cefepime started 7/2 per pharmacy will continue 6 weeks per ID until August 13th. -Pain control with 1mg  dilaudid q3prn. Patient has been requesting roughly 3mg  of dilautid a day, will change to Norco 10-325 q3hrs prn. -PT/OT continue to treat, patient encouraged to request pain medication if pain prohibits participation in PT/OT. Patient may need to go to SNF after discharge.  -CRP 3.7, Sed rate 54. Will monitor for effectiveness of Abx treatment.  ESRD (end stage renal disease) on dialysis  -Normal  Dialysis schedule is T, TH, S. (She follows with Dr. Geanie Berlin, HD at [facility]) Cont normal schedule.  Hypothyroidism  -Continue home synthroid dose of .  -TSH on 09/05/12 is 114. Unlikely that patient is compliant with home medication.  Anemia in chronic kidney disease(285.21)  -Patient with ESRD, normally hemoglobin runs 8-9 currently hemoglobin is 12.4 before HD. Will continue to monitor.  -Hgb stable 7/8 was 11.2, no need for EPO.  Severe spinal stenosis at L2-3  -Unchanged compared to previous MRI.  -Pain control with PRN pain medication  Accelerated Hypertension-resolved  -Remains elevated, home HTN meds resumed. Continue Dialysis  -Continue to  monitor.  PVD (peripheral vascular disease)  -No ulcers noted.  Bradycardia-resolved  #Code Status: full code  Dispo: Disposition is deferred at this time, awaiting improvement of current medical problems. Anticipated discharge in approximately 1-2 day(s).  The patient does have a current PCP [name], MD) and does not need an [facility] hospital follow-up appointment after discharge.  The patient does not have transportation limitations that hinder transportation to clinic appointments.   .Services Needed at time of discharge: Y = Yes, Blank = No PT:   OT:   RN:   Equipment:   Other:     LOS: 7 days   Carlynn Purl, DO 09/12/2012, 5:02 PM

## 2012-09-12 NOTE — Evaluation (Signed)
Physical Therapy Evaluation Patient Details Name: Nicole [name] MRN: [id number] DOB: 03-24-1932 Today's Date: 09/12/2012 Time: 4696-2952 PT Time Calculation (min): 32 min  PT Assessment / Plan / Recommendation History of Present Illness  Nicole [name] is a 79 year old female with a PMH significant for ESRD, PVD, Hypothyroidism, Iron deficiency anemia, depression, and AAA.  She presented to [facility] ED for lower back pain for the past 3 days.  She reports the pain is sharp, constant, worsening, 10/10, radiates down the front of right leg.  The pain in back/legs feels like never before (worse than in January); she did not try any medications to help with pain. Denies fever/chills.  Found to have Osteomyelitis of lumbar spine.  Clinical Impression  Pt s/p IR drainage of B/L psoas abscess 09/11/12 and reports pain much better today than has been previously so able to tolerate mobilization today. Pt currently with functional limitations due to the deficits listed below (see PT Problem List). Pt will benefit from skilled PT to increase their independence and safety with mobility to allow discharge to the venue listed below.  Pt able to tolerate short distance ambulation limited by R LE pain and fatigue.  Pt also performed exercises in recliner to tolerance and rest breaks as needed.     PT Assessment  Patient needs continued PT services    Follow Up Recommendations  Supervision/Assistance - 24 hour;SNF    Does the patient have the potential to tolerate intense rehabilitation      Barriers to Discharge        Equipment Recommendations  Rolling walker with 5" wheels (youth)    Recommendations for Other Services     Frequency Min 3X/week    Precautions / Restrictions Precautions Precautions: Fall Restrictions Weight Bearing Restrictions: No   Pertinent Vitals/Pain 6-7/10 R hip and LE pain in supine, premedicated, pt states pain much better than previously and agreeable to mobilize      Mobility   Bed Mobility Bed Mobility: Supine to Sit;Sitting - Scoot to Edge of Bed Supine to Sit: 6: Modified independent (Device/Increase time);With rails;HOB elevated Sitting - Scoot to Edge of Bed: 6: Modified independent (Device/Increase time);With rail Transfers Transfers: Sit to Stand;Stand to Sit Sit to Stand: 4: Min assist;With upper extremity assist;From bed Stand to Sit: 4: Min assist;Without upper extremity assist;To chair/3-in-1 Details for Transfer Assistance: verbal cues for hand placement, pt with uncontrolled descent to chair Ambulation/Gait Ambulation/Gait Assistance: 4: Min guard Ambulation Distance (Feet): 35 Feet Assistive device: Rolling walker Ambulation/Gait Assistance Details: verbal cues for posture and safe use of RW, pt reports she usually uses SPC or hangs on son's arm for assist to appointments Gait Pattern: Step-through pattern;Decreased stride length;Trunk flexed;Antalgic Gait velocity: decreased    Exercises General Exercises - Lower Extremity Ankle Circles/Pumps: AROM;Both;20 reps;Limitations Ankle Circles/Pumps Limitations: L ankle observed less AROM compared to R Quad Sets: AROM;20 reps;Both Short Arc Quad: AROM;AAROM;Both;15 reps;Other (comment) (AAROM for R LE) Heel Slides: AAROM;Both;15 reps   PT Diagnosis: Difficulty walking;Acute pain  PT Problem List: Decreased strength;Decreased activity tolerance;Decreased mobility;Decreased range of motion;Decreased knowledge of use of DME;Pain PT Treatment Interventions: DME instruction;Gait training;Functional mobility training;Stair training;Therapeutic activities;Therapeutic exercise;Patient/family education     PT Goals(Current goals can be found in the care plan section) Acute Rehab PT Goals Patient Stated Goal: return to PLOF PT Goal Formulation: With patient Time For Goal Achievement: 09/26/12 Potential to Achieve Goals: Fair  Visit Information  Last PT Received On: 09/12/12 Assistance Needed:  +1 History  of Present Illness: Nicole [name] is a 79 year old female with a PMH significant for ESRD, PVD, Hypothyroidism, Iron deficiency anemia, depression, and AAA.  She presented to [facility] ED for lower back pain for the past 3 days.  She reports the pain is sharp, constant, worsening, 10/10, radiates down the front of right leg.  The pain in back/legs feels like never before (worse than in January); she did not try any medications to help with pain. Denies fever/chills.  Found to have Osteomyelitis of lumbar spine.       Prior Functioning  Home Living Family/patient expects to be discharged to:: Private residence Living Arrangements: Children Available Help at Discharge: Family;Available 24 hours/day Type of Home: House Home Access: Stairs to enter [employer] of Steps: 1 Entrance Stairs-Rails: None Home Layout: Two level;Bed/bath upstairs Alternate Level Stairs-Number of Steps: 12 Alternate Level Stairs-Rails: Right;Left;Can reach both Home Equipment: Cane - single point Additional Comments: only uses cane when she goes out.  states son works days, and dtr in Social worker works nights, and she has essentially 24 hour assist.  Prior Function Level of Independence: Independent with assistive device(s) Comments: Pt reports she sponge bathes Communication Communication: No difficulties Dominant Hand: Right    Cognition  Cognition Arousal/Alertness: Awake/alert Behavior During Therapy: WFL for tasks assessed/performed Overall Cognitive Status: Within Functional Limits for tasks assessed    Extremity/Trunk Assessment Lower Extremity Assessment Lower Extremity Assessment: RLE deficits/detail RLE Deficits / Details: grossly 2+/5 throughout, pt required active assist for some exercises due to weakness, also reports increased pain down R LE   Balance    End of Session PT - End of Session Equipment Utilized During Treatment: Gait belt Activity Tolerance: Patient limited by pain Patient  left: in chair;with call bell/phone within reach  GP     [name],[name] 09/12/2012, 12:38 PM Zenovia Jarred, PT, DPT 09/12/2012 Pager: [phone]

## 2012-09-12 NOTE — Progress Notes (Signed)
ANTIBIOTIC CONSULT NOTE - FOLLOW UP  Pharmacy Consult for Vancomycin and Cefepime Indication: Recurrent diskitis/osteomyelitis with psoas abscess; possible bacteremia  No Known Allergies  Patient Measurements: Height: 5\' 3"  (160 cm) Weight: 101 lb 6.3 oz (45.992 kg) IBW/kg (Calculated) : 52.4 Adjusted Body Weight:   Vital Signs: Temp: 98.1 F (36.7 C) (07/08 0900) Temp src: Oral (07/08 0900) BP: 131/90 mmHg (07/08 0900) Pulse Rate: 91 (07/08 0900) Intake/Output from previous day: 07/07 0701 - 07/08 0700 In: 360 [P.O.:360] Out: -  Intake/Output from this shift: Total I/O In: 240 [P.O.:240] Out: -   Labs:  Recent Labs  09/10/12 0906 09/11/12 0500  CREATININE 5.69* 7.51*   Estimated Creatinine Clearance: 4.5 ml/min (by C-G formula based on Cr of 7.51). No results found for this basename: VANCOTROUGH, VANCOPEAK, VANCORANDOM, GENTTROUGH, GENTPEAK, GENTRANDOM, TOBRATROUGH, TOBRAPEAK, TOBRARND, AMIKACINPEAK, AMIKACINTROU, AMIKACIN,  in the last 72 hours   Microbiology: Recent Results (from the past 720 hour(s))  CULTURE, BLOOD (ROUTINE X 2)     Status: None   Collection Time    09/05/12  1:00 PM      Result Value Range Status   Specimen Description BLOOD HAND LEFT   Final   Special Requests BOTTLES DRAWN AEROBIC AND ANAEROBIC 4CC   Final   Culture  Setup Time 09/05/2012 16:21   Final   Culture     Final   Value: STAPHYLOCOCCUS SPECIES (COAGULASE NEGATIVE)     Note: THE SIGNIFICANCE OF ISOLATING THIS ORGANISM FROM A SINGLE SET OF BLOOD CULTURES WHEN MULTIPLE SETS ARE DRAWN IS UNCERTAIN. PLEASE NOTIFY THE MICROBIOLOGY DEPARTMENT WITHIN ONE WEEK IF SPECIATION AND SENSITIVITIES ARE REQUIRED.     Note: Gram Stain Report Called to,Read Back By and Verified With: ANDREW Delford Field @ [phone] 09/06/12 BY KRAWS   Report Status 09/07/2012 FINAL   Final  CULTURE, BLOOD (ROUTINE X 2)     Status: None   Collection Time    09/05/12  4:45 PM      Result Value Range Status   Specimen  Description BLOOD HEMODIALYSIS LINE   Final   Special Requests BOTTLES DRAWN AEROBIC AND ANAEROBIC 10CC   Final   Culture  Setup Time 09/06/2012 00:16   Final   Culture NO GROWTH 5 DAYS   Final   Report Status 09/12/2012 FINAL   Final  MRSA PCR SCREENING     Status: None   Collection Time    09/05/12  9:48 PM      Result Value Range Status   MRSA by PCR NEGATIVE  NEGATIVE Final   Comment:            The GeneXpert MRSA Assay (FDA     approved for NASAL specimens     only), is one component of a     comprehensive MRSA colonization     surveillance program. It is not     intended to diagnose MRSA     infection nor to guide or     monitor treatment for     MRSA infections.  GRAM STAIN     Status: None   Collection Time    09/06/12  2:32 PM      Result Value Range Status   Specimen Description ABSCESS BACK   Final   Special Requests DISC SPACE L 2 3   Final   Gram Stain     Final   Value: ABUNDANT WBC PRESENT,BOTH PMN AND MONONUCLEAR     NO ORGANISMS SEEN   Report  Status 09/06/2012 FINAL   Final  CULTURE, ROUTINE-ABSCESS     Status: None   Collection Time    09/06/12  2:32 PM      Result Value Range Status   Specimen Description ABSCESS BACK   Final   Special Requests L 2 3 SPACE   Final   Gram Stain     Final   Value: ABUNDANT WBC PRESENT,BOTH PMN AND MONONUCLEAR     NO SQUAMOUS EPITHELIAL CELLS SEEN     NO ORGANISMS SEEN     Performed at [facility]   Culture NO GROWTH 3 DAYS   Final   Report Status 09/10/2012 FINAL   Final  CULTURE, ROUTINE-ABSCESS     Status: None   Collection Time    09/11/12 12:55 PM      Result Value Range Status   Specimen Description ABSCESS OTHER   Final   Special Requests RIGHT PSOAS ABSCESS   Final   Gram Stain     Final   Value: MODERATE WBC PRESENT,BOTH PMN AND MONONUCLEAR     NO SQUAMOUS EPITHELIAL CELLS SEEN     NO ORGANISMS SEEN   Culture PENDING   Incomplete   Report Status PENDING   Incomplete  ANAEROBIC CULTURE     Status:  None   Collection Time    09/11/12 12:56 PM      Result Value Range Status   Specimen Description ABSCESS OTHER   Final   Special Requests RIGHT PSOAS ABSCESS   Final   Gram Stain     Final   Value: MODERATE WBC PRESENT,BOTH PMN AND MONONUCLEAR     NO SQUAMOUS EPITHELIAL CELLS SEEN     NO ORGANISMS SEEN   Culture PENDING   Incomplete   Report Status PENDING   Incomplete    Anti-infectives   Start     Dose/Rate Route Frequency Ordered Stop   09/07/12 1800  ceFEPIme (MAXIPIME) 2 g in dextrose 5 % 50 mL IVPB     2 g 100 mL/hr over 30 Minutes Intravenous Every T-Th-Sa (1800) 09/06/12 1540     09/07/12 1200  vancomycin (VANCOCIN) 500 mg in sodium chloride 0.9 % 100 mL IVPB     500 mg 100 mL/hr over 60 Minutes Intravenous Every T-Th-Sa (Hemodialysis) 09/06/12 1540     09/06/12 1600  vancomycin (VANCOCIN) IVPB 1000 mg/200 mL premix     1,000 mg 200 mL/hr over 60 Minutes Intravenous  Once 09/06/12 1538 09/06/12 1903   09/06/12 1600  ceFEPIme (MAXIPIME) 2 g in dextrose 5 % 50 mL IVPB     2 g 100 mL/hr over 30 Minutes Intravenous  Once 09/06/12 1538 09/06/12 1649   09/05/12 1215  vancomycin (VANCOCIN) IVPB 1000 mg/200 mL premix  Status:  Discontinued     1,000 mg 200 mL/hr over 60 Minutes Intravenous  Once 09/05/12 1205 09/05/12 1217      Assessment: 80yof on Vancomycin and Cefepime Day 6 for recurrent diskitis/osteomyelitis and psoas abscess. Patient is currently afebrile, WBC wnl and cultures have revealed no significant growth. Patient has ESRD and receives HD TTS - next HD session today. Vancomycin and Cefepime have correctly been given with each HD session since 7/2 - will plan to check pre-HD Vancomycin level on 7/10 if continued.   Goal of Therapy:  Pre-HD Vancomycin level: 15-25 mcg/ml  Plan:  1. Continue Vancomycin 500mg  IV qHD (TTS) 2. Continue Cefepime 2g IV qHD (TTS) 3. Check Vancomycin pre-HD level on Thursday 7/10  with AM labs 4. Monitor cultures, clinical course, HD  plan and adjust as indicated  Cleon Dew [phone] 09/12/2012,11:17 AM

## 2012-09-12 NOTE — Progress Notes (Signed)
  Date: 09/12/2012  Patient name: Nicole [name]  Medical record number: [id number]  Date of birth: 1932/11/28   This patient has been seen and the plan of care was discussed with the house staff. Please see their note for complete details. I concur with their findings with the following additions/corrections:  Doing well clinically today, but her spirits are down. We offered to have chaplain speak with her. So far no organisms identified in psoas abscess aspirate. Will need Vanc and Cefepime if this is the case for 6 weeks per ID (through August 13th). Expect D/C tomorrow.  She is able to move bilat lower extremities and no sensory/motor deficits noted.  Jonah Blue, DO 09/12/2012, 2:05 PM

## 2012-09-12 NOTE — Progress Notes (Signed)
Patient ID: Nicole [name], female   DOB: 01/09/1933, 79 y.o.   MRN: [id number]         [facility]    Date of Admission:  09/05/2012           Day 6 vancomycin        Day 6 cefepime Principal Problem:   Osteomyelitis of lumbar spine Active Problems:   Discitis of lumbar region   Psoas abscess   Hypertension   ESRD (end stage renal disease) on dialysis   PVD (peripheral vascular disease)   Hypothyroidism   Anemia in chronic kidney disease(285.21)   Unspecified protein-calorie malnutrition  Subjective: She denies any current tobacco pain.  Objective: Temp:  [97.2 F (36.2 C)-98.7 F (37.1 C)] 98.7 F (37.1 C) (07/08 1215) Pulse Rate:  [62-91] 65 (07/08 1245) Resp:  [16-20] 16 (07/08 1245) BP: (107-204)/(59-90) 146/69 mmHg (07/08 1245) SpO2:  [96 %-100 %] 96 % (07/08 0529) Weight:  [45.992 kg (101 lb 6.3 oz)] 45.992 kg (101 lb 6.3 oz) (07/07 2119)  General: She is resting quietly in bed on hemodialysis  Lab Results Lab Results  Component Value Date   WBC 8.2 09/12/2012   HGB 11.2* 09/12/2012   HCT 32.9* 09/12/2012   MCV 100.0 09/12/2012   PLT 246 09/12/2012    Microbiology: Recent Results (from the past 240 hour(s))  CULTURE, BLOOD (ROUTINE X 2)     Status: None   Collection Time    09/05/12  1:00 PM      Result Value Range Status   Specimen Description BLOOD HAND LEFT   Final   Special Requests BOTTLES DRAWN AEROBIC AND ANAEROBIC 4CC   Final   Culture  Setup Time 09/05/2012 16:21   Final   Culture     Final   Value: STAPHYLOCOCCUS SPECIES (COAGULASE NEGATIVE)     Note: THE SIGNIFICANCE OF ISOLATING THIS ORGANISM FROM A SINGLE SET OF BLOOD CULTURES WHEN MULTIPLE SETS ARE DRAWN IS UNCERTAIN. PLEASE NOTIFY THE MICROBIOLOGY DEPARTMENT WITHIN ONE WEEK IF SPECIATION AND SENSITIVITIES ARE REQUIRED.     Note: Gram Stain Report Called to,Read Back By and Verified With: ANDREW Delford Field @ [phone] 09/06/12 BY KRAWS   Report Status 09/07/2012 FINAL   Final    CULTURE, BLOOD (ROUTINE X 2)     Status: None   Collection Time    09/05/12  4:45 PM      Result Value Range Status   Specimen Description BLOOD HEMODIALYSIS LINE   Final   Special Requests BOTTLES DRAWN AEROBIC AND ANAEROBIC 10CC   Final   Culture  Setup Time 09/06/2012 00:16   Final   Culture NO GROWTH 5 DAYS   Final   Report Status 09/12/2012 FINAL   Final  MRSA PCR SCREENING     Status: None   Collection Time    09/05/12  9:48 PM      Result Value Range Status   MRSA by PCR NEGATIVE  NEGATIVE Final   Comment:            The GeneXpert MRSA Assay (FDA     approved for NASAL specimens     only), is one component of a     comprehensive MRSA colonization     surveillance program. It is not     intended to diagnose MRSA     infection nor to guide or     monitor treatment for     MRSA infections.  GRAM STAIN  Status: None   Collection Time    09/06/12  2:32 PM      Result Value Range Status   Specimen Description ABSCESS BACK   Final   Special Requests DISC SPACE L 2 3   Final   Gram Stain     Final   Value: ABUNDANT WBC PRESENT,BOTH PMN AND MONONUCLEAR     NO ORGANISMS SEEN   Report Status 09/06/2012 FINAL   Final  CULTURE, ROUTINE-ABSCESS     Status: None   Collection Time    09/06/12  2:32 PM      Result Value Range Status   Specimen Description ABSCESS BACK   Final   Special Requests L 2 3 SPACE   Final   Gram Stain     Final   Value: ABUNDANT WBC PRESENT,BOTH PMN AND MONONUCLEAR     NO SQUAMOUS EPITHELIAL CELLS SEEN     NO ORGANISMS SEEN     Performed at [facility]   Culture NO GROWTH 3 DAYS   Final   Report Status 09/10/2012 FINAL   Final  CULTURE, ROUTINE-ABSCESS     Status: None   Collection Time    09/11/12 12:55 PM      Result Value Range Status   Specimen Description ABSCESS OTHER   Final   Special Requests RIGHT PSOAS ABSCESS   Final   Gram Stain     Final   Value: MODERATE WBC PRESENT,BOTH PMN AND MONONUCLEAR     NO SQUAMOUS EPITHELIAL  CELLS SEEN     NO ORGANISMS SEEN   Culture PENDING   Incomplete   Report Status PENDING   Incomplete  ANAEROBIC CULTURE     Status: None   Collection Time    09/11/12 12:56 PM      Result Value Range Status   Specimen Description ABSCESS OTHER   Final   Special Requests RIGHT PSOAS ABSCESS   Final   Gram Stain     Final   Value: MODERATE WBC PRESENT,BOTH PMN AND MONONUCLEAR     NO SQUAMOUS EPITHELIAL CELLS SEEN     NO ORGANISMS SEEN   Culture     Final   Value: NO ANAEROBES ISOLATED; CULTURE IN PROGRESS FOR 5 DAYS   Report Status PENDING   Incomplete    Studies/Results: Ct Image Guided Fluid Drain By Catheter  09/11/2012   *RADIOLOGY REPORT*  Clinical Data: Right psoas abscess, lumbar diskitis  CT GUIDED RIGHT PSOAS SMALL FLUID COLLECTION NEEDLE ASPIRATION  Date:  09/11/2012 11:35:00  Radiologist:  Judie Petit. Ruel Favors, M.D.  Medications:  50 mcg Fentanyl, 1 mg Versed  Guidance:  CT  Fluoroscopy time:  None.  Sedation time:  16 minutes  Contrast volume:  None.  Complications:  No immediate  PROCEDURE/FINDINGS:  Informed consent was obtained from the patient following explanation of the procedure, risks, benefits and alternatives. The patient understands, agrees and consents for the procedure. All questions were addressed.  A time out was performed.  Maximal barrier sterile technique utilized including caps, mask, sterile gowns, sterile gloves, large sterile drape, hand hygiene, and betadine  Previous imaging reviewed.  The patient was positioned prone. Noncontrast localization CT performed.  The right iliopsoas fluid collection was localized.  Under sterile conditions and local anesthesia, CT guidance was utilized to advance a 17 gauge 11.8 cm access needle into the collection.  Needle position confirmed with CT.  Syringe aspiration yielded 12 ml brown exudative fluid. Sample sent for Gram stain and culture.  Collection completely decompressed.  Needle removed.  No immediate complication.  The patient  tolerated the procedure well.  IMPRESSION: Successful CT guided right psoas abscess needle aspiration   Original Report Authenticated By: Judie Petit. Miles Costain, M.D.    Assessment: Gram stain of her psoas abscess does not reveal any organisms and her culture is negative so far. She will need to continue empiric IV vancomycin and cefepime unless cultures turn positive. She will need a minimum of 6 weeks of antibiotic therapy continuing through August 13.  Plan: 1. Continue vancomycin and cefepime through August 13 2. I will monitor cultures and if they turn positive I will make adjustments in her outpatient antibiotic therapy 3. I will arrange followup in our clinic  Cliffton Asters, MD [facility] for [facility] [phone] pager   [phone] cell 09/12/2012, 1:54 PM

## 2012-09-12 NOTE — Progress Notes (Signed)
Stable hemodynamics.  Still [name]/o discomfort in back.  Will continue support with HD on schedule. Nicole [name]

## 2012-09-12 NOTE — Progress Notes (Signed)
Chaplain Note: Pt not in room. Per nurse pt is at dialysis.  Will follow up tomorrow.  Rutherford Nail Chaplain [phone]

## 2012-09-12 NOTE — Progress Notes (Signed)
Subjective:  Eating brk. "feeling Better", not as much back pain this  am,  Objective Vital signs in last 24 hours: Filed Vitals:   09/11/12 1450 09/11/12 1827 09/11/12 2119 09/12/12 0529  BP: 153/67 151/72 107/64 152/59  Pulse: 62 72 86 83  Temp: 98.2 F (36.8 C) 98.7 F (37.1 C) 98.4 F (36.9 C) 98.6 F (37 C)  TempSrc: Oral Oral Oral Oral  Resp: 16 16 16 16   Height:      Weight:   45.992 kg (101 lb 6.3 oz)   SpO2: 100% 100% 100% 96%   Weight change: 0 kg (0 lb)  Intake/Output Summary (Last 24 hours) at 09/12/12 0825 Last data filed at 09/12/12 0557  Gross per 24 hour  Intake    360 ml  Output      0 ml  Net    360 ml   Labs: Basic Metabolic Panel:  Recent Labs Lab 09/08/12 0733 09/09/12 0730 09/10/12 0906 09/11/12 0500  NA 135 136 138 139  K 4.1 5.7* 3.7 4.0  CL 94* 99 95* 95*  CO2 24 22 29 29   GLUCOSE 107* 98 113* 97  BUN 48* 21 19 29*  CREATININE 12.67* 6.89* 5.69* 7.51*  CALCIUM 9.3 8.4 9.6 9.6  PHOS 4.6  --   --  5.1*   Liver Function Tests:  Recent Labs Lab 09/08/12 0733 09/11/12 0500  ALBUMIN 2.8* 2.8*   No results found for this basename: LIPASE, AMYLASE,  in the last 168 hours No results found for this basename: AMMONIA,  in the last 168 hours CBC:  Recent Labs Lab 09/05/12 0833 09/06/12 0440 09/08/12 0733 09/09/12 0730  WBC 5.9 4.4 5.7 6.1  NEUTROABS 3.2  --   --   --   HGB 12.4 12.5 11.8* 11.6*  HCT 37.2 38.5 34.3* 33.0*  MCV 102.2* 103.8* 99.4 98.5  PLT 352 314 296 346   Cardiac Enzymes: No results found for this basename: CKTOTAL, CKMB, CKMBINDEX, TROPONINI,  in the last 168 hours CBG:  Recent Labs Lab 09/08/12 1437 09/08/12 1725  GLUCAP 150* 121*    Iron Studies: No results found for this basename: IRON, TIBC, TRANSFERRIN, FERRITIN,  in the last 72 hours Studies/Results: Ct Image Guided Fluid Drain By Catheter  09/11/2012   *RADIOLOGY REPORT*  Clinical Data: Right psoas abscess, lumbar diskitis  CT GUIDED RIGHT PSOAS  SMALL FLUID COLLECTION NEEDLE ASPIRATION  Date:  09/11/2012 11:35:00  Radiologist:  Judie Petit. Ruel Favors, M.D.  Medications:  50 mcg Fentanyl, 1 mg Versed  Guidance:  CT  Fluoroscopy time:  None.  Sedation time:  16 minutes  Contrast volume:  None.  Complications:  No immediate  PROCEDURE/FINDINGS:  Informed consent was obtained from the patient following explanation of the procedure, risks, benefits and alternatives. The patient understands, agrees and consents for the procedure. All questions were addressed.  A time out was performed.  Maximal barrier sterile technique utilized including caps, mask, sterile gowns, sterile gloves, large sterile drape, hand hygiene, and betadine  Previous imaging reviewed.  The patient was positioned prone. Noncontrast localization CT performed.  The right iliopsoas fluid collection was localized.  Under sterile conditions and local anesthesia, CT guidance was utilized to advance a 17 gauge 11.8 cm access needle into the collection.  Needle position confirmed with CT.  Syringe aspiration yielded 12 ml brown exudative fluid. Sample sent for Gram stain and culture.  Collection completely decompressed.  Needle removed.  No immediate complication.  The patient tolerated  the procedure well.  IMPRESSION: Successful CT guided right psoas abscess needle aspiration   Original Report Authenticated By: Judie Petit. Miles Costain, M.D.       Physical Exam: General: Alert/nad.eating brk Heart: RRR Lungs: CTA Abdomen: BS pos. ,soft ,nontender Extremities: Dialysis Access: no pedal edema./pos. Bruit  R FA AVGG   Dialysis Orders ([facility] TTS)  F160 4hr 2.25Ca EDW 52kg RFA AVG Heparin 6000  Hectorol 1ug, no Epo - last dose 9000 6/14 at op center,on venofer 50/week  Labs: Hgb 13.3 Feb109 and 61%sat 6/28, - iPTH 196 in April   Assessment  1 Discitis / psoas abcess- on Vanco and Maxipime per ID  2 ESRD-, hd TTS ( [facility]) hd today 3 HTN- BP 152/59 this am/eating well now.but no excess vol / Lisinopril 20mg  hs/  below EDW  4 Anemia- Hb 11.6, no epo  5 Sec HPT- cont vit D, pth in range,last checked April ,check while in hosp. / on 2 phos lo ac 6 Hypothyroidism- synthroid 150mg ,TSH ^ on admit/ compliance questionable  7 Hx pe  8. PVD   Lenny Pastel, PA-C [facility], [facility], [facility] Beeper [phone] 09/12/2012,8:25 AM  LOS: 7 days

## 2012-09-12 NOTE — Progress Notes (Addendum)
Occupational Therapy Treatment Patient Details Name: Nicole [name] MRN: [id number] DOB: 1932/04/30 Today's Date: 09/12/2012 Time: 4098-1191 OT Time Calculation (min): 16 min  OT Assessment / Plan / Recommendation  OT comments  Pt making progress, but still recommend OT at SNF.  Follow Up Recommendations  SNF       Equipment Recommendations  3 in 1 bedside comode       Frequency Min 2X/week   Progress towards OT Goals Progress towards OT goals: Progressing toward goals  Plan Discharge plan remains appropriate    Precautions / Restrictions Precautions Precautions: Fall Restrictions Weight Bearing Restrictions: No   Pertinent Vitals/Pain 7/10 in back and down right leg; repositioned and applied heat     ADL  Lower Body Dressing: Set up;Performed Where Assessed - Lower Body Dressing: Unsupported sitting Toilet Transfer: Minimal assistance;Simulated Toilet Transfer Method: Sit to stand Toilet Transfer Equipment:  (Bed>out door down hallway>recliner) Equipment Used: Rolling walker;Gait belt Transfers/Ambulation Related to ADLs: Min A for all with RW      OT Goals(current goals can now be found in the care plan section)    Visit Information  Last OT Received On: 09/12/12 Assistance Needed: +1 PT/OT Co-Evaluation/Treatment: Yes History of Present Illness: Nicole [name] is a 79 year old female with a PMH significant for ESRD, PVD, Hypothyroidism, Iron deficiency anemia, depression, and AAA.  She presented to [facility] ED for lower back pain for the past 3 days.  She reports the pain is sharp, constant, worsening, 10/10, radiates down the front of right leg.  The pain in back/legs feels like never before (worse than in January); she did not try any medications to help with pain. Denies fever/chills.  Found to have Osteomyelitis of lumbar spine.          Cognition  Cognition Arousal/Alertness: Awake/alert Behavior During Therapy: WFL for tasks assessed/performed Overall Cognitive  Status: Within Functional Limits for tasks assessed    Mobility  Bed Mobility Bed Mobility: Supine to Sit;Sitting - Scoot to Edge of Bed Supine to Sit: 6: Modified independent (Device/Increase time);With rails;HOB elevated Sitting - Scoot to Edge of Bed: 6: Modified independent (Device/Increase time);With rail Transfers Transfers: Sit to Stand;Stand to Sit Sit to Stand: 4: Min assist;With upper extremity assist;From bed Stand to Sit: 4: Min assist;Without upper extremity assist;To chair/3-in-1 (VCs for safe hand placement, pt just "plopped" down)          End of Session OT - End of Session Equipment Utilized During Treatment: Gait belt;Rolling walker Activity Tolerance: Patient limited by fatigue;Patient limited by pain Patient left: in chair;with call bell/phone within reach       Evette Georges [phone] 09/12/2012, 11:48 AM

## 2012-09-12 NOTE — Procedures (Addendum)
Tolerating hemodialysis today without hemodynamic instability.   Nicole [name]

## 2012-09-13 DIAGNOSIS — G0490 Encephalitis and encephalomyelitis, unspecified: Secondary | ICD-10-CM

## 2012-09-13 DIAGNOSIS — I10 Essential (primary) hypertension: Secondary | ICD-10-CM

## 2012-09-13 LAB — PTH, INTACT AND CALCIUM
Calcium, Total (PTH): 9.1 mg/dL (ref 8.4–10.5)
PTH: 78.5 pg/mL — ABNORMAL HIGH (ref 14.0–72.0)

## 2012-09-13 NOTE — Progress Notes (Signed)
Encouraged pt to get out of bed to the chair for dinner, but pt refused. Says "I don't feel up to it right now". Will continue to try and motivate pt to get out of bed.

## 2012-09-13 NOTE — Progress Notes (Signed)
Patient ID: Nicole [name], female   DOB: 07-Dec-1932, 79 y.o.   MRN: [id number]         [facility]    Date of Admission:  09/05/2012           Day 7 vancomycin        Day 7 cefepime Principal Problem:   Osteomyelitis of lumbar spine Active Problems:   Discitis of lumbar region   Psoas abscess   Hypertension   ESRD (end stage renal disease) on dialysis   PVD (peripheral vascular disease)   Hypothyroidism   Anemia in chronic kidney disease(285.21)   Unspecified protein-calorie malnutrition  Subjective: She's had a little more back pain today than she was yesterday.  Objective: Temp:  [97.6 F (36.4 C)-98.8 F (37.1 C)] 97.7 F (36.5 C) (07/09 1332) Pulse Rate:  [50-96] 85 (07/09 1332) Resp:  [18-27] 18 (07/09 1332) BP: (98-169)/(38-93) 150/75 mmHg (07/09 1332) SpO2:  [94 %-100 %] 99 % (07/09 1332) Weight:  [42.8 kg (94 lb 5.7 oz)-45.36 kg (100 lb)] 45.36 kg (100 lb) (07/08 2101)  General: Alert but uncomfortable  Lab Results Lab Results  Component Value Date   WBC 8.2 09/12/2012   HGB 11.2* 09/12/2012   HCT 32.9* 09/12/2012   MCV 100.0 09/12/2012   PLT 246 09/12/2012    Microbiology: Recent Results (from the past 240 hour(s))  CULTURE, BLOOD (ROUTINE X 2)     Status: None   Collection Time    09/05/12  1:00 PM      Result Value Range Status   Specimen Description BLOOD HAND LEFT   Final   Special Requests BOTTLES DRAWN AEROBIC AND ANAEROBIC 4CC   Final   Culture  Setup Time 09/05/2012 16:21   Final   Culture     Final   Value: STAPHYLOCOCCUS SPECIES (COAGULASE NEGATIVE)     Note: THE SIGNIFICANCE OF ISOLATING THIS ORGANISM FROM A SINGLE SET OF BLOOD CULTURES WHEN MULTIPLE SETS ARE DRAWN IS UNCERTAIN. PLEASE NOTIFY THE MICROBIOLOGY DEPARTMENT WITHIN ONE WEEK IF SPECIATION AND SENSITIVITIES ARE REQUIRED.     Note: Gram Stain Report Called to,Read Back By and Verified With: ANDREW Delford Field @ [phone] 09/06/12 BY KRAWS   Report Status 09/07/2012 FINAL    Final  CULTURE, BLOOD (ROUTINE X 2)     Status: None   Collection Time    09/05/12  4:45 PM      Result Value Range Status   Specimen Description BLOOD HEMODIALYSIS LINE   Final   Special Requests BOTTLES DRAWN AEROBIC AND ANAEROBIC 10CC   Final   Culture  Setup Time 09/06/2012 00:16   Final   Culture NO GROWTH 5 DAYS   Final   Report Status 09/12/2012 FINAL   Final  MRSA PCR SCREENING     Status: None   Collection Time    09/05/12  9:48 PM      Result Value Range Status   MRSA by PCR NEGATIVE  NEGATIVE Final   Comment:            The GeneXpert MRSA Assay (FDA     approved for NASAL specimens     only), is one component of a     comprehensive MRSA colonization     surveillance program. It is not     intended to diagnose MRSA     infection nor to guide or     monitor treatment for     MRSA infections.  Romie Minus  STAIN     Status: None   Collection Time    09/06/12  2:32 PM      Result Value Range Status   Specimen Description ABSCESS BACK   Final   Special Requests DISC SPACE L 2 3   Final   Gram Stain     Final   Value: ABUNDANT WBC PRESENT,BOTH PMN AND MONONUCLEAR     NO ORGANISMS SEEN   Report Status 09/06/2012 FINAL   Final  CULTURE, ROUTINE-ABSCESS     Status: None   Collection Time    09/06/12  2:32 PM      Result Value Range Status   Specimen Description ABSCESS BACK   Final   Special Requests L 2 3 SPACE   Final   Gram Stain     Final   Value: ABUNDANT WBC PRESENT,BOTH PMN AND MONONUCLEAR     NO SQUAMOUS EPITHELIAL CELLS SEEN     NO ORGANISMS SEEN     Performed at [facility]   Culture NO GROWTH 3 DAYS   Final   Report Status 09/10/2012 FINAL   Final  CULTURE, ROUTINE-ABSCESS     Status: None   Collection Time    09/11/12 12:55 PM      Result Value Range Status   Specimen Description ABSCESS OTHER   Final   Special Requests RIGHT PSOAS ABSCESS   Final   Gram Stain     Final   Value: MODERATE WBC PRESENT,BOTH PMN AND MONONUCLEAR     NO SQUAMOUS  EPITHELIAL CELLS SEEN     NO ORGANISMS SEEN   Culture NO GROWTH 1 DAY   Final   Report Status PENDING   Incomplete  ANAEROBIC CULTURE     Status: None   Collection Time    09/11/12 12:56 PM      Result Value Range Status   Specimen Description ABSCESS OTHER   Final   Special Requests RIGHT PSOAS ABSCESS   Final   Gram Stain     Final   Value: MODERATE WBC PRESENT,BOTH PMN AND MONONUCLEAR     NO SQUAMOUS EPITHELIAL CELLS SEEN     NO ORGANISMS SEEN   Culture     Final   Value: NO ANAEROBES ISOLATED; CULTURE IN PROGRESS FOR 5 DAYS   Report Status PENDING   Incomplete   Assessment: She has recurrent severe vertebral infection complicated by bilateral psoas abscesses. Cultures of her L2-3 disc aspirate and right psoas abscess aspirate remain negative. She will need to stay on empiric vancomycin and cefepime for a minimum of 6 weeks then have followup scans.  Plan: 1. Continue vancomycin and cefepime through August 13 as a minimum  Cliffton Asters, MD [facility] for Infectious Disease [facility] [phone] pager   [phone] cell 09/13/2012, 2:09 PM

## 2012-09-13 NOTE — Progress Notes (Signed)
NUTRITION FOLLOW UP  Intervention:   1. Continue Nepro Shake po BID, each supplement provides 425 kcal and 19 grams protein.   2. Question is pt may benefit from an appetite stimulant medication?   3. Recommend liberalize diet to increase food choices.   Nutrition Dx:   Increased nutrient needs related to ESRD as evidenced by estimated nutrient needs    Goal:   Oral intake with meals and supplements to meet >/=90% estimated nutrition needs.   Monitor:   Po intake, weight trends, labs, I/O's  Assessment:   S/p IR aspiration and drain placement in psoas abscess. Continues with HD on schedule.  Pt reports she has no appetite. Is eating very little, most meals are documented 0-50% completions. States she is drinking 1-2 Nepro Shakes per day. Encouraged pt to try her best for 2 shakes per day.  Denied any current pain.  Height: Ht Readings from Last 1 Encounters:  09/10/12 5\' 3"  (1.6 m)    Weight Status:   Wt Readings from Last 1 Encounters:  09/12/12 100 lb (45.36 kg)    Re-estimated needs:  Kcal: 1700-1900 Protein: 80-90 gm  Fluid: 1.2 L   Skin: back incision  Diet Order: Renal   Intake/Output Summary (Last 24 hours) at 09/13/12 1054 Last data filed at 09/12/12 2102  Gross per 24 hour  Intake    240 ml  Output    995 ml  Net   -755 ml    Last BM: no bowel movements documented    Labs:   Recent Labs Lab 09/08/12 0733  09/10/12 0906 09/11/12 0500 09/12/12 1257  NA 135  < > 138 139 136  K 4.1  < > 3.7 4.0 3.4*  CL 94*  < > 95* 95* 90*  CO2 24  < > 29 29 28   BUN 48*  < > 19 29* 43*  CREATININE 12.67*  < > 5.69* 7.51* 10.64*  CALCIUM 9.3  < > 9.6 9.6 9.6  PHOS 4.6  --   --  5.1* 4.6  GLUCOSE 107*  < > 113* 97 120*  < > = values in this interval not displayed.  CBG (last 3)  No results found for this basename: GLUCAP,  in the last 72 hours  Scheduled Meds: . calcium acetate  1,334 mg Oral TID WC  . ceFEPime (MAXIPIME) IV  2 g Intravenous Q  T,Th,Sat-1800  . docusate sodium  100 mg Oral BID  . feeding supplement (NEPRO CARB STEADY)  237 mL Oral BID BM  . heparin  5,000 Units Subcutaneous Q8H  . levothyroxine  150 mcg Oral QAC breakfast  . lisinopril  20 mg Oral QHS  . multivitamin  1 tablet Oral QHS  . polyethylene glycol  17 g Oral BID  . sodium chloride  3 mL Intravenous Q12H  . vancomycin  500 mg Intravenous Q T,Th,Sa-HD    Continuous Infusions: none   Clarene Duke RD, LDN Pager [phone] After Hours pager [phone]

## 2012-09-13 NOTE — Progress Notes (Signed)
Subjective:  Continues to feel better with improving back/leg pain/ needs to tolerate HD in sitting in recliner( as [facility] before dc Objective Vital signs in last 24 hours: Filed Vitals:   09/12/12 1645 09/12/12 1720 09/12/12 2101 09/13/12 0507  BP: 157/65 150/93 149/59 157/54  Pulse: 68 75 82 50  Temp:  98.8 F (37.1 C) 98.7 F (37.1 C) 97.9 F (36.6 C)  TempSrc:  Oral Oral Oral  Resp: 27 20 20 20   Height:      Weight:   45.36 kg (100 lb)   SpO2:  98% 97% 98%   Weight change: -3.192 kg (-7 lb 0.6 oz)  Intake/Output Summary (Last 24 hours) at 09/13/12 1914 Last data filed at 09/12/12 2102  Gross per 24 hour  Intake    480 ml  Output    995 ml  Net   -515 ml   Labs: Basic Metabolic Panel:  Recent Labs Lab 09/08/12 0733  09/10/12 0906 09/11/12 0500 09/12/12 1257  NA 135  < > 138 139 136  K 4.1  < > 3.7 4.0 3.4*  CL 94*  < > 95* 95* 90*  CO2 24  < > 29 29 28   GLUCOSE 107*  < > 113* 97 120*  BUN 48*  < > 19 29* 43*  CREATININE 12.67*  < > 5.69* 7.51* 10.64*  CALCIUM 9.3  < > 9.6 9.6 9.6  PHOS 4.6  --   --  5.1* 4.6  < > = values in this interval not displayed. Liver Function Tests:  Recent Labs Lab 09/08/12 0733 09/11/12 0500 09/12/12 1257  ALBUMIN 2.8* 2.8* 2.8*   No results found for this basename: LIPASE, AMYLASE,  in the last 168 hours No results found for this basename: AMMONIA,  in the last 168 hours CBC:  Recent Labs Lab 09/08/12 0733 09/09/12 0730 09/12/12 1257  WBC 5.7 6.1 8.2  HGB 11.8* 11.6* 11.2*  HCT 34.3* 33.0* 32.9*  MCV 99.4 98.5 100.0  PLT 296 346 246   Cardiac Enzymes: No results found for this basename: CKTOTAL, CKMB, CKMBINDEX, TROPONINI,  in the last 168 hours CBG:  Recent Labs Lab 09/08/12 1437 09/08/12 1725  GLUCAP 150* 121*    Physical Exam: General:Alert, nad ,pleasant Heart: RRR  Lungs: CTA  Abdomen: BS pos. ,soft ,nontender  Extremities: Dialysis Access: no pedal edema./pos. Bruit R FA AVGG    Dialysis Orders ([facility] T[facility])  F160 4hr 2.25Ca EDW 52kg RFA AVG Heparin 6000  Hectorol 1ug, no Epo - last dose 9000 6/14 at op center,on venofer 50/week  Labs: Hgb 13.3 Feb109 and 61%sat 6/28, - iPTH 196 in April   Assessment / plan 1 Discitis / psoas abcess- on Vanco and Maxipime per ID / admit teamRX 2 ESRD-, hd TTS [facility]) hd thurs. In Recliner 3.4 k yest. Fu pre hd use 4kbath to start 3 HTN- BP 157/54 this am/eating well now.uf 2 l in am/ Lisinopril 20mg  hs/ below EDW  4 Anemia- Hb 11.6> 11.2 no epo  5 Sec HPT- on  vit D, pth 87,196< Apr. 2014  , hold vit D, Ca10.8 corec. Phos 4.6, on 2 phos lo ac  6 Hypothyroidism- synthroid 150mg ,TSH ^ on admit/ compliance questionable  7 Hx pe  8. PVD   Lenny Pastel, PA-C [facility] Beeper [phone] 09/13/2012,8:22 AM  LOS: 8 days   Renal Attending As above, she is clinically slowly improving.  Appears somewhat depressed over situation For HD in  AM. Lauris Poag

## 2012-09-13 NOTE — Progress Notes (Signed)
Subjective: Patient reports she is feeling much better, in minimal pain.  She reports having pain last night and the nurses were slow to respond, once they did she took her PO pain medication and the pain was relieved.  Per nursing, patients bedside remote call button feel to floor and patient was heard calling vocally for help.  They responded gave her pain medication and educated her about additional call button on guard rails.  Patient denies any other complaints and reports that she did get up and work with PT yesterday. Objective: Vital signs in last 24 hours: Filed Vitals:   09/12/12 1645 09/12/12 1720 09/12/12 2101 09/13/12 0507  BP: 157/65 150/93 149/59 157/54  Pulse: 68 75 82 50  Temp:  98.8 F (37.1 C) 98.7 F (37.1 C) 97.9 F (36.6 C)  TempSrc:  Oral Oral Oral  Resp: 27 20 20 20   Height:      Weight:   100 lb (45.36 kg)   SpO2:  98% 97% 98%   Weight change: -7 lb 0.6 oz (-3.192 kg)  Intake/Output Summary (Last 24 hours) at 09/13/12 0705 Last data filed at 09/12/12 2102  Gross per 24 hour  Intake    480 ml  Output    995 ml  Net   -515 ml   General: resting in bed, NAD HEENT: EOMI  Cardiac: RRR, no rubs, murmurs or gallops  Pulm: clear to auscultation bilaterally, moving normal volumes of air  Abd: soft, nontender, nondistended, BS present  Ext: warm and well perfused, no pedal edema; no tenderness to left thigh  Neuro: alert and oriented X2, lower extremity sensation intact. Able to lift and hold RLE and LLE in air for 3-5 seconds.  Lab Results: Basic Metabolic Panel:  Recent Labs Lab 09/11/12 0500 09/12/12 1257  NA 139 136  K 4.0 3.4*  CL 95* 90*  CO2 29 28  GLUCOSE 97 120*  BUN 29* 43*  CREATININE 7.51* 10.64*  CALCIUM 9.6 9.6  PHOS 5.1* 4.6   Liver Function Tests:  Recent Labs Lab 09/11/12 0500 09/12/12 1257  ALBUMIN 2.8* 2.8*  CBC:  Recent Labs Lab 09/09/12 0730 09/12/12 1257  WBC 6.1 8.2  HGB 11.6* 11.2*  HCT 33.0* 32.9*  MCV  98.5 100.0  PLT 346 246   CBG:  Recent Labs Lab 09/08/12 1437 09/08/12 1725  GLUCAP 150* 121*   Thyroid Function Tests:  Recent Labs Lab 09/11/12 0500  FREET4 1.02    Micro Results: Recent Results (from the past 240 hour(s))  CULTURE, BLOOD (ROUTINE X 2)     Status: None   Collection Time    09/05/12  1:00 PM      Result Value Range Status   Specimen Description BLOOD HAND LEFT   Final   Special Requests BOTTLES DRAWN AEROBIC AND ANAEROBIC 4CC   Final   Culture  Setup Time 09/05/2012 16:21   Final   Culture     Final   Value: STAPHYLOCOCCUS SPECIES (COAGULASE NEGATIVE)     Note: THE SIGNIFICANCE OF ISOLATING THIS ORGANISM FROM A SINGLE SET OF BLOOD CULTURES WHEN MULTIPLE SETS ARE DRAWN IS UNCERTAIN. PLEASE NOTIFY THE MICROBIOLOGY DEPARTMENT WITHIN ONE WEEK IF SPECIATION AND SENSITIVITIES ARE REQUIRED.     Note: Gram Stain Report Called to,Read Back By and Verified With: Marney Setting @ [phone] 09/06/12 BY KRAWS   Report Status 09/07/2012 FINAL   Final  CULTURE, BLOOD (ROUTINE X 2)     Status: None   Collection  Time    09/05/12  4:45 PM      Result Value Range Status   Specimen Description BLOOD HEMODIALYSIS LINE   Final   Special Requests BOTTLES DRAWN AEROBIC AND ANAEROBIC 10CC   Final   Culture  Setup Time 09/06/2012 00:16   Final   Culture NO GROWTH 5 DAYS   Final   Report Status 09/12/2012 FINAL   Final  MRSA PCR SCREENING     Status: None   Collection Time    09/05/12  9:48 PM      Result Value Range Status   MRSA by PCR NEGATIVE  NEGATIVE Final   Comment:            The GeneXpert MRSA Assay (FDA     approved for NASAL specimens     only), is one component of a     comprehensive MRSA colonization     surveillance program. It is not     intended to diagnose MRSA     infection nor to guide or     monitor treatment for     MRSA infections.  GRAM STAIN     Status: None   Collection Time    09/06/12  2:32 PM      Result Value Range Status   Specimen Description  ABSCESS BACK   Final   Special Requests DISC SPACE L 2 3   Final   Gram Stain     Final   Value: ABUNDANT WBC PRESENT,BOTH PMN AND MONONUCLEAR     NO ORGANISMS SEEN   Report Status 09/06/2012 FINAL   Final  CULTURE, ROUTINE-ABSCESS     Status: None   Collection Time    09/06/12  2:32 PM      Result Value Range Status   Specimen Description ABSCESS BACK   Final   Special Requests L 2 3 SPACE   Final   Gram Stain     Final   Value: ABUNDANT WBC PRESENT,BOTH PMN AND MONONUCLEAR     NO SQUAMOUS EPITHELIAL CELLS SEEN     NO ORGANISMS SEEN     Performed at [facility]   Culture NO GROWTH 3 DAYS   Final   Report Status 09/10/2012 FINAL   Final  CULTURE, ROUTINE-ABSCESS     Status: None   Collection Time    09/11/12 12:55 PM      Result Value Range Status   Specimen Description ABSCESS OTHER   Final   Special Requests RIGHT PSOAS ABSCESS   Final   Gram Stain     Final   Value: MODERATE WBC PRESENT,BOTH PMN AND MONONUCLEAR     NO SQUAMOUS EPITHELIAL CELLS SEEN     NO ORGANISMS SEEN   Culture PENDING   Incomplete   Report Status PENDING   Incomplete  ANAEROBIC CULTURE     Status: None   Collection Time    09/11/12 12:56 PM      Result Value Range Status   Specimen Description ABSCESS OTHER   Final   Special Requests RIGHT PSOAS ABSCESS   Final   Gram Stain     Final   Value: MODERATE WBC PRESENT,BOTH PMN AND MONONUCLEAR     NO SQUAMOUS EPITHELIAL CELLS SEEN     NO ORGANISMS SEEN   Culture     Final   Value: NO ANAEROBES ISOLATED; CULTURE IN PROGRESS FOR 5 DAYS   Report Status PENDING   Incomplete   Studies/Results: Ct Image Guided Fluid  Drain By Catheter  09/11/2012   *RADIOLOGY REPORT*  Clinical Data: Right psoas abscess, lumbar diskitis  CT GUIDED RIGHT PSOAS SMALL FLUID COLLECTION NEEDLE ASPIRATION  Date:  09/11/2012 11:35:00  Radiologist:  M. Ruel Favors, M.D.  Medications:  50 mcg Fentanyl, 1 mg Versed  Guidance:  CT  Fluoroscopy time:  None.  Sedation time:  16  minutes  Contrast volume:  None.  Complications:  No immediate  PROCEDURE/FINDINGS:  Informed consent was obtained from the patient following explanation of the procedure, risks, benefits and alternatives. The patient understands, agrees and consents for the procedure. All questions were addressed.  A time out was performed.  Maximal barrier sterile technique utilized including caps, mask, sterile gowns, sterile gloves, large sterile drape, hand hygiene, and betadine  Previous imaging reviewed.  The patient was positioned prone. Noncontrast localization CT performed.  The right iliopsoas fluid collection was localized.  Under sterile conditions and local anesthesia, CT guidance was utilized to advance a 17 gauge 11.8 cm access needle into the collection.  Needle position confirmed with CT.  Syringe aspiration yielded 12 ml brown exudative fluid. Sample sent for Gram stain and culture.  Collection completely decompressed.  Needle removed.  No immediate complication.  The patient tolerated the procedure well.  IMPRESSION: Successful CT guided right psoas abscess needle aspiration   Original Report Authenticated By: Judie Petit. Miles Costain, M.D.   Medications: I have reviewed the patient's current medications. Scheduled Meds: . calcium acetate  1,334 mg Oral TID WC  . ceFEPime (MAXIPIME) IV  2 g Intravenous Q T,Th,Sat-1800  . docusate sodium  100 mg Oral BID  . doxercalciferol  1 mcg Intravenous Q T,Th,Sa-HD  . feeding supplement (NEPRO CARB STEADY)  237 mL Oral BID BM  . heparin  5,000 Units Subcutaneous Q8H  . levothyroxine  150 mcg Oral QAC breakfast  . lisinopril  20 mg Oral QHS  . multivitamin  1 tablet Oral QHS  . polyethylene glycol  17 g Oral BID  . sodium chloride  3 mL Intravenous Q12H  . 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  -Pain control Norco 10-325 q3hrs prn.  -PT/OT continue to treat, patient encouraged to request pain medication if pain prohibits participation in PT/OT. -Currently awaiting SNF placement. -CRP 3.7, Sed rate 54. Will monitor for effectiveness of Abx treatment.  ESRD (end stage renal disease) on dialysis  -Normal Dialysis schedule is T, TH, S. (She follows with Dr. Geanie Berlin, HD at [facility]) Cont normal schedule.  Hypothyroidism  -Continue home synthroid dose of .  -TSH on 09/05/12 is 114. Unlikely that patient is compliant with home medication.  Anemia in chronic kidney disease(285.21)  -Patient with ESRD, normally hemoglobin runs 8-9 currently hemoglobin is 12.4 before HD. Will continue to monitor.  -Hgb stable 7/8 was 11.2, no need for EPO.  Severe spinal stenosis at L2-3  -Unchanged compared to previous MRI.  -Pain control with PRN pain medication  Accelerated Hypertension-resolved  -Remains elevated, home HTN meds resumed. Continue Dialysis  -Continue to monitor.  Bradycardia-resolved  #Code Status: full code  Dispo: Disposition is deferred at this time, awaiting SNF placement.  Anticipated discharge in 1 day. The patient does  have a current PCP [name], MD) and does not need an [facility] hospital follow-up appointment after discharge.  The patient does not have transportation limitations that hinder transportation to clinic appointments.    .Services Needed at time of discharge: Y = Yes, Blank = No PT:   OT:   RN:   Equipment:   Other:     LOS: 8 days   Carlynn Purl, DO 09/13/2012, 7:05 AM

## 2012-09-13 NOTE — Clinical Social Work Note (Signed)
CSW talked with patient's son, Nylia Gavina by phone regarding ST rehab and patient and son in agreement (full assessment to follow). SNF search initiated and facility preference is [facility] with anticipated d/c of 09/14/12.  Genelle Bal, MSW, LCSW [phone]

## 2012-09-13 NOTE — Progress Notes (Signed)
  Date: 09/13/2012  Patient name: Nicole [name]  Medical record number: [id number]  Date of birth: Jul 09, 1932   This patient has been seen and the plan of care was discussed with the house staff. Please see their note for complete details. I concur with their findings with the following additions/corrections:  Back pain is better. She is able to move both LE without difficulty and sensation is intact. Continue Vanc and cefepime for 6 weeks until August 13th. She will need to F/U with ID. She is stable for discharge. IV abx will need to be given with HD. Medically stable.  Jonah Blue, DO 09/13/2012, 2:05 PM

## 2012-09-14 LAB — BASIC METABOLIC PANEL
CO2: 19 mEq/L (ref 19–32)
Chloride: 100 mEq/L (ref 96–112)
GFR calc Af Amer: 6 mL/min — ABNORMAL LOW (ref >90)
Sodium: 134 mEq/L — ABNORMAL LOW (ref 135–145)

## 2012-09-14 LAB — VANCOMYCIN, RANDOM: Vancomycin Rm: 8.6 ug/mL

## 2012-09-14 LAB — CBC
Platelets: 271 10*3/uL (ref 150–400)
RBC: 2.93 MIL/uL — ABNORMAL LOW (ref 3.87–5.11)
WBC: 5.1 10*3/uL (ref 4.0–10.5)

## 2012-09-14 MED ORDER — DSS 100 MG PO CAPS: 100.0000 mg | ORAL_CAPSULE | Freq: Two times a day (BID) | ORAL | Status: DC

## 2012-09-14 MED ORDER — VANCOMYCIN HCL IN DEXTROSE 750-5 MG/150ML-% IV SOLN: 750.0000 mg | INTRAVENOUS | Status: DC

## 2012-09-14 MED ORDER — LIDOCAINE-PRILOCAINE 2.5-2.5 % EX CREA: TOPICAL_CREAM | CUTANEOUS | Status: DC | PRN

## 2012-09-14 MED ORDER — HYDROCODONE-ACETAMINOPHEN 7.5-325 MG PO TABS
1.0000 | ORAL_TABLET | Freq: Once | ORAL | Status: AC
  Administered 2012-09-14: 1 via ORAL
  Filled 2012-09-14: qty 1

## 2012-09-14 MED ORDER — DEXTROSE 5 % IV SOLN: 2.0000 g | INTRAVENOUS | Status: DC

## 2012-09-14 MED ORDER — LIDOCAINE-PRILOCAINE 2.5-2.5 % EX CREA
TOPICAL_CREAM | CUTANEOUS | Status: DC | PRN
  Filled 2012-09-14 (×2): qty 5

## 2012-09-14 MED ORDER — CALCIUM ACETATE 667 MG PO CAPS: 1334.0000 mg | ORAL_CAPSULE | Freq: Three times a day (TID) | ORAL | Status: DC

## 2012-09-14 MED ORDER — VANCOMYCIN HCL IN DEXTROSE 750-5 MG/150ML-% IV SOLN
750.0000 mg | INTRAVENOUS | Status: DC
  Administered 2012-09-14: 750 mg via INTRAVENOUS
  Filled 2012-09-14: qty 150

## 2012-09-14 MED ORDER — HYDROCODONE-ACETAMINOPHEN 10-325 MG PO TABS: 1.0000 | ORAL_TABLET | ORAL | Status: DC | PRN

## 2012-09-14 NOTE — Discharge Summary (Addendum)
Name: Nicole [name] MRN: [id number] DOB: May 14, 1932 79 y.o. PCP: [name], MD  Date of Admission: 09/05/2012  4:26 AM Date of Discharge: 09/15/2012 Attending Physician: Jonah Blue, DO  Discharge Diagnosis:   Osteomyelitis of lumbar spine   Discitis of lumbar region   Psoas abscess   Hypertension   ESRD (end stage renal disease) on dialysis   Hypothyroidism   Unspecified protein-calorie malnutrition   Prolonged QT syndrome    Discharge Medications:   Medication List    STOP taking these medications       dextrose 5 % SOLN 50 mL with cefTAZidime 2 G SOLR 2 g     HYDROcodone-acetaminophen 5-325 MG per tablet  Commonly known as:  NORCO/VICODIN  Replaced by:  HYDROcodone-acetaminophen 10-325 MG per tablet      TAKE these medications       amLODipine 10 MG tablet  Commonly known as:  NORVASC  Take 10 mg by mouth at bedtime.     calcium acetate 667 MG capsule  Commonly known as:  PHOSLO  Take 2 capsules (1,334 mg total) by mouth 3 (three) times daily with meals.     dextrose 5 % SOLN 50 mL with ceFEPIme 2 G SOLR 2 g  Inject 2 g into the vein every Tuesday, Thursday, and Saturday at 6 PM.     DSS 100 MG Caps  Take 100 mg by mouth 2 (two) times daily.     feeding supplement Liqd  Take 1 Container by mouth 3 (three) times daily between meals.     HYDROcodone-acetaminophen 10-325 MG per tablet  Commonly known as:  NORCO  Take 1 tablet by mouth every 3 (three) hours as needed.     ibuprofen 400 MG tablet  Commonly known as:  ADVIL,MOTRIN  Take 1 tablet (400 mg total) by mouth every 6 (six) hours as needed for pain.     levothyroxine 150 MCG tablet  Commonly known as:  SYNTHROID, LEVOTHROID  Take 1 tablet (150 mcg total) by mouth daily before breakfast.     lidocaine-prilocaine cream  Commonly known as:  EMLA  Apply topically as needed (1 hours prior to dialysis).     lisinopril 40 MG tablet  Commonly known as:  PRINIVIL,ZESTRIL  Take 40 mg by  mouth at bedtime.     meloxicam 15 MG tablet  Commonly known as:  MOBIC  Take 1 tablet (15 mg total) by mouth daily.     multivitamin Tabs tablet  Take 1 tablet by mouth at bedtime.     Vancomycin 750 MG/150ML Soln  Commonly known as:  VANCOCIN  Inject 150 mLs (750 mg total) into the vein Every Tuesday,Thursday,and Saturday with dialysis.        Disposition and follow-up:   [name] was discharged from [facility] in Stable condition.  At the hospital follow up visit please address:  1.  Pain Control.  Compliance with synthroid.  Adequate PO intake/supplements.  Continue Vancomycin and Cefepime until at minimum Aug 13th.   2.  Labs / imaging needed at time of follow-up: TSH  3.  Pending labs/ test needing follow-up: None  Follow-up Appointments: Follow-up Information   Follow up with COLADONATO,JOSEPH A, MD. Schedule an appointment as soon as possible for a visit in 1 week.   Contact information:   [address] [facility] [place] [address] [phone]       Discharge Instructions: Discharge Orders   Future Orders  Complete By Expires     Call MD for:  difficulty breathing, headache or visual disturbances  As directed     Call MD for:  persistant nausea and vomiting  As directed     Call MD for:  redness, tenderness, or signs of infection (pain, swelling, redness, odor or green/yellow discharge around incision site)  As directed     Call MD for:  severe uncontrolled pain  As directed     Call MD for:  temperature >100.4  As directed     Diet renal [id number]  As directed     Discharge instructions  As directed     Comments:      Continue Vancomycin and Cefepime with Dialysis treatments Continue 1 tab of Norco every three hours as needed for pain. Please follow up with PCP when you return home from Skilled Nursing Facility.    Increase activity slowly  As directed     No dressing needed  As directed         Consultations: Treatment Team:  Maree Krabbe, MD  Procedures Performed:  Dg Lumbar Spine Complete  09/05/2012   *RADIOLOGY REPORT*  Clinical Data: Low back pain and right leg pain  LUMBAR SPINE - COMPLETE 4+ VIEW  Comparison: 03/20/2012  Findings: Five lumbar type vertebral bodies are well visualized. There are chronic changes noted at the L2-3 interspace with destruction of the endplates and sclerosis.  The overall appearance is stable.  Chronic anterolisthesis of L4 on L5 is seen.  No acute compression deformity is noted.  IMPRESSION: Chronic changes without acute abnormality.   Original Report Authenticated By: Alcide Clever, M.D.   Mr Lumbar Spine Wo Contrast  09/05/2012   *RADIOLOGY REPORT*  Clinical Data: Low back pain with right leg pain.  History of diskitis.  Dialysis patient. Intravenous contrast not administered due to dialysis.  MRI LUMBAR SPINE WITHOUT CONTRAST  Technique:  Multiplanar and multiecho pulse sequences of the lumbar spine were obtained without intravenous contrast.  Comparison: Lumbar MRI 03/21/2012  Findings: Progressive changes at L2-3 compared with the prior study.  There is endplate destruction with increased fluid in the disc space and increase in bone marrow in the edema at L2 and L3. The findings are suggestive of recurrent diskitis.  In addition, there are fluid collections in the psoas muscle bilaterally which were not present previously consistent with abscess.  This measures 23 mm in the right psoas muscle and 12 mm in the left psoas muscle. Conus medullaris is normal and terminates at L1-2.  L1-2:  Negative  L2-3:  Progressive changes of diskitis and osteomyelitis compared with the prior MRI.  See above comments.  There is extensive osteophyte formation causing severe spinal stenosis which is similar to the prior study.  There is facet and ligamentum flavum hypertrophy and foraminal encroachment bilaterally with impingement of the L2 nerve root bilaterally.  L3-4:   Negative  L4-5:  12 mm anterior slip is unchanged.  There is severe disc degeneration.  There is impingement of the L4 nerve root bilaterally due to marked foraminal encroachment which is unchanged.  There is mild to spinal stenosis.  L5-S1:  Disc degeneration and spondylosis is unchanged.  Mild foraminal narrowing bilaterally.  IMPRESSION: Progressive changes at L2-3 consistent with recurrent diskitis and osteomyelitis. Interval development of bilateral psoas abscess. There is severe spinal stenosis at L2-3 related to spondylosis and facet hypertrophy.  This is unchanged.  No epidural abscess is identified.  12 mm anterior slip  L4-5 with marked foraminal encroachment bilaterally.  This is unchanged from the prior MRI.   Original Report Authenticated By: Janeece Riggers, M.D.   Ir Fluoro Guide Ndl Plmt / Bx  09/11/2012   *RADIOLOGY REPORT*  Clinical Data:  Patient with diskitis/osteomyelitis and back pain at L2-L3.  FLUOROSCOPIC-GUIDED DISC ASPIRATION AT L2-L3  Comparison: MRI scan of the lumbosacral spine of 09/05/2012.  Following a full explanation of the procedure along with the potential associated complications, an informed witnessed consent was obtained.  The patient was laid prone on the fluoroscopic table. The skin overlying the lumbosacral region was then prepped and draped in the usual sterile fashion. The skin entry sites for needle to the right of the midline was then infiltrated with 0.25% bupivacaine, and advanced into the subcutaneous and paraspinous soft tissues.  Using biplane intermittent fluoroscopy, a 21-gauge Franseen biopsy needle was then advanced into the L2-L3 disc space without difficulty  Using a 20 ml syringe, approximately 3.5 ml of blood-tinged turbid liquid was obtained and sent for microbiological analysis.  A second pass was made with a 20-gauge Franseen biopsy needle. This too was advanced into the L2-L3 disc space and positioned in a different location in the disc space.  Again using  a 20 ml syringe, approximately 0.5 ml of blood-tinged fluid was obtained and sent for microbiologic analysis.  The needle was removed.  Hemostasis was achieved at the skin entry site.  The patient tolerated the procedure well.  There were no acute complications.  Medications utilized: Versed 3 mg IV.  Fentanyl 75 mcg IV.  IMPRESSION 1.  Status post fluoroscopic-guided disc aspiration at L2-L3, with recovery of approximately 3.5 ml of turbid blood-tinged fluid. This was sent for microbiologic analysis.   Original Report Authenticated By: Julieanne Cotton, M.D.   Ct Image Guided Fluid Drain By Catheter  09/11/2012   *RADIOLOGY REPORT*  Clinical Data: Right psoas abscess, lumbar diskitis  CT GUIDED RIGHT PSOAS SMALL FLUID COLLECTION NEEDLE ASPIRATION  Date:  09/11/2012 11:35:00  Radiologist:  M. Ruel Favors, M.D.  Medications:  50 mcg Fentanyl, 1 mg Versed  Guidance:  CT  Fluoroscopy time:  None.  Sedation time:  16 minutes  Contrast volume:  None.  Complications:  No immediate  PROCEDURE/FINDINGS:  Informed consent was obtained from the patient following explanation of the procedure, risks, benefits and alternatives. The patient understands, agrees and consents for the procedure. All questions were addressed.  A time out was performed.  Maximal barrier sterile technique utilized including caps, mask, sterile gowns, sterile gloves, large sterile drape, hand hygiene, and betadine  Previous imaging reviewed.  The patient was positioned prone. Noncontrast localization CT performed.  The right iliopsoas fluid collection was localized.  Under sterile conditions and local anesthesia, CT guidance was utilized to advance a 17 gauge 11.8 cm access needle into the collection.  Needle position confirmed with CT.  Syringe aspiration yielded 12 ml brown exudative fluid. Sample sent for Gram stain and culture.  Collection completely decompressed.  Needle removed.  No immediate complication.  The patient tolerated the procedure  well.  IMPRESSION: Successful CT guided right psoas abscess needle aspiration   Original Report Authenticated By: Judie Petit. Miles Costain, M.D.   Admission HPI: Nicole [name] is a 79 year old female with a PMH significant for ESRD, PVD, Hypothyroidism, Iron deficiency anemia, depression, and AAA. She presented to [facility] ED for lower back pain for the past 3 days. She reports the pain is sharp, constant, worsening, 10/10, radiates down the  front of right leg. The pain in back/legs feels like never before (worse than in January); she did not try any medications to help with pain. Denies fever/chills.  Hasn't been able to sit to have a bowel movement for the last 3 days. Tried some back pills without relief (Done's back pills). She does feel better after dilaudid 0.5mg  given in the ED. Pain is worse with movement, it hurts to walk or sit up in bed. She lives with son who helps with food.  Physical Exam on admission:  Blood pressure 169/85, pulse 72, temperature 98 F (36.7 C), temperature source Oral, resp. rate 20, SpO2 96.00%.  General: resting in bed  HEENT: PERRL, EOMI  Cardiac: Bradycardic, no rubs, murmurs or gallops  Pulm: clear to auscultation bilaterally, moving normal volumes of air  Abd: soft, nontender, nondistended, BS normalactive  Ext: warm and well perfused, no pedal edema. Right internal and external rotation mildly limited secondary to pain.  Back: TTP R>L lumbar spine and paraspinal muscles.  Neuro: alert and oriented X3, cranial nerves II-XII grossly intact  Dialysis Access: right lower AVGG + bruit  Lab results on admission:  Basic Metabolic Panel:  Recent Labs   09/05/12 0833   NA  135   K  4.2   CL  91*   CO2  25   GLUCOSE  86   BUN  50*   CREATININE  11.77*   CALCIUM  8.9    CBC:  Recent Labs   09/05/12 0833   WBC  5.9   NEUTROABS  3.2   HGB  12.4   HCT  37.2   MCV  102.2*   PLT  352      Hospital Course by problem list:    Discitis/Osteomyelitis of lumbar spine with B/L  psoas abscess   Patient presented with chief compliant of 10/10 back pain with a history significant for ESRD on dialysis, and lumbar discitis/osteomyelitis treated in January 2014.  MRI completed on admission showed recurrent diskitis and osteomyelitis and interval development of bilateral psoas abscess without epidural abscess.  Two sets of blood cultures were obtained, 1 was became positive for coag neg staph thought to be a contaminate.  A CT aspiration was obtained the next day and cultures from that fluid also came back with no growth.  Empiric vancomycin and cefepime were started on 09/06/12.  The patient remained afebrile and back pain was treated with IV dilaudid (max 1mg  q3 prn).  The patient was then taken for CT aspiration and culture of her R psoas abscess, which improved that patient's pain but the culture did not show any growth.  Per ID's recommendation patient will be treated with vancomycin and cefepime until Aug 13th, 2014 at a minimum.  The antibiotics will be given at HD. Patient will continue to follow up with infections disease and have follow up scans.  Patient was discharged to SNF on 09/14/12.   Accelerated Hypertension   Patient presented with BP readings in the 230s/70s, was bradycardic and in pain.  Patient is ESRD on dialysis and was due for dialysis on the day of admission.  She was given 0.1 of clonidine and hydralazine 10mg  in the ED.  Once admited she was sent for dialysis which removed excess fluid and resolved her accelerated hypertension.  She was resumed on her home antihypertensives of lisinopril 40mg  and amlodipine 10mg  and continued her normal dialysis schedule.   ESRD (end stage renal disease) on dialysis Patient was due for  dialysis on day of admission, she received dialysis and was resumed on her regular schedule of T, TH, S.  On the final day of her hospitalization she reported that she could not tolerate the needle sticks from the dialysis and was given Emla cream  before HD cannulation.   Hypothyroidism   Patient reported a history of hypothyroidism and that she took of levothyroxine daily.  A TSH was obtained on admission which was 114.7.  Patient was believed to have poor compliance with her home medication.  She was restarted on her home dose and on 7/7 her free T4 was checked and was normal at 1.02. Prolonged QT Patient had a QTc of 483.  Medications were reviewed and QT prolonging medications were avoided.      Discharge Vitals:   BP 126/56  Pulse 76  Temp(Src) 98.2 F (36.8 C) (Oral)  Resp 16  Ht 5\' 3"  (1.6 m)  Wt 97 lb 6 oz (44.169 kg)  BMI 17.25 kg/m2  SpO2 100%  Discharge Labs:  Results for orders placed during the hospital encounter of 09/05/12 (from the past 24 hour(s))  VANCOMYCIN, RANDOM     Status: None   Collection Time    09/14/12 12:00 PM      Result Value Range   Vancomycin Rm 8.6    CBC     Status: Abnormal   Collection Time    09/14/12 12:56 PM      Result Value Range   WBC 5.1  4.0 - 10.5 K/uL   RBC 2.93 (*) 3.87 - 5.11 MIL/uL   Hemoglobin 9.8 (*) 12.0 - 15.0 g/dL   HCT 16.1 (*) 09.6 - 04.5 %   MCV 97.6  78.0 - 100.0 fL   MCH 33.4  26.0 - 34.0 pg   MCHC 34.3  30.0 - 36.0 g/dL   RDW 40.9  81.1 - 91.4 %   Platelets 271  150 - 400 K/uL  BASIC METABOLIC PANEL     Status: Abnormal   Collection Time    09/14/12 12:56 PM      Result Value Range   Sodium 134 (*) 135 - 145 mEq/L   Potassium 4.4  3.5 - 5.1 mEq/L   Chloride 100  96 - 112 mEq/L   CO2 19  19 - 32 mEq/L   Glucose, Bld 77  70 - 99 mg/dL   BUN 22  6 - 23 mg/dL   Creatinine, Ser 7.82 (*) 0.50 - 1.10 mg/dL   Calcium 7.9 (*) 8.4 - 10.5 mg/dL   GFR calc non Af Amer 5 (*) >90 mL/min   GFR calc Af Amer 6 (*) >90 mL/min    Signed: Carlynn Purl, DO 09/15/2012, 8:14 AM   Time Spent on Discharge: 40 minutes Services Ordered on Discharge: PT/OT Equipment Ordered on Discharge: Rolling walker with 5" wheels, 3 in 1 bedside commode

## 2012-09-14 NOTE — Procedures (Signed)
Pt seen on HD.  Ap 140 Vp 1250.  BFR 400.  SBP 155.  Tolerating HD well so far.

## 2012-09-14 NOTE — Clinical Social Work Note (Signed)
Clinical Social Work Department BRIEF PSYCHOSOCIAL ASSESSMENT 09/14/2012  Patient:  Nicole [name], Nicole [name]     Account Number:  [id number]     Admit date:  09/05/2012  Clinical Social Worker:  Delmer Islam  Date/Time:  09/14/2012 07:28 AM  Referred by:  Physician  Date Referred:  09/13/2012 Referred for  SNF Placement   Other Referral:   Interview type:  Family Other interview type:   Son Ilisa Hayworth ([phone])    PSYCHOSOCIAL DATA Living Status:  FAMILY Admitted from facility:   Level of care:   Primary support name:  Geniene List Primary support relationship to patient:  CHILD, ADULT Degree of support available:   Very involved and concerned about patient.    CURRENT CONCERNS Current Concerns  Post-Acute Placement   Other Concerns:    SOCIAL WORK ASSESSMENT / PLAN On 09/13/12 CSW spoke by phone with patient's son Brett Canales. Son reported that his mother lives with him and he works during the day. CSW talked with son about discharge plans and the recommendation of ST rehab. Son in agreement and informed CSW that patient has been to [facility] before and he and patient really liked the facility and it is close to his home. His preference is [facility].    Son also reported that he is very concerned about his mother at his home as she is alone most of the time and after coming home from dialysis. They have talked about patient going somewhere long-term and patient is in agreement. CSW listened and offered support and talked with son about LTC SNF versus ALF for he and patient to consider. Son is aware that [facility] has an ALF and this was very appealing to him. CSW also talked with son about paying for LTC and the payor options. Mr. Pontarelli advised that [facility] will be contacted on 7/10 regarding bed availability for ST rehab and he will be notified.   Assessment/plan status:  Psychosocial Support/Ongoing Assessment of Needs Other assessment/ plan:   Information/referral to  community resources:   Talked with son about LTC options and LTC payor options.    PATIENT'S/FAMILY'S RESPONSE TO PLAN OF CARE: Son very receptive to talking with CSW and expressed appreciation for information shared.

## 2012-09-14 NOTE — Progress Notes (Signed)
Patient ID: Nicole [name], female   DOB: 1932/03/23, 79 y.o.   MRN: [id number]         [facility]    Date of Admission:  09/05/2012           Day 8 vancomycin        Day 8 cefepime Principal Problem:   Osteomyelitis of lumbar spine Active Problems:   Discitis of lumbar region   Psoas abscess   Hypertension   ESRD (end stage renal disease) on dialysis   PVD (peripheral vascular disease)   Hypothyroidism   Anemia in chronic kidney disease(285.21)   Unspecified protein-calorie malnutrition  Subjective: Overall, she feels like her back pain is much improved since admission but she is still having significant pain in her left hip. She says she's been unable to work with physical therapy because of the pain and rates her current pain in her left hip 7/10.  Objective: Temp:  [97.3 F (36.3 C)-98 F (36.7 C)] 98 F (36.7 C) (07/10 1000) Pulse Rate:  [58-85] 66 (07/10 1000) Resp:  [15-18] 16 (07/10 1000) BP: (139-164)/(60-111) 150/68 mmHg (07/10 1000) SpO2:  [96 %-99 %] 97 % (07/10 1000) Weight:  [44.169 kg (97 lb 6 oz)] 44.169 kg (97 lb 6 oz) (07/09 2025)  General: She appears weak and uncomfortable Skin: No rash Lungs: Clear Cor: Regular S1 and S2 no murmurs  Lab Results Lab Results  Component Value Date   WBC 8.2 09/12/2012   HGB 11.2* 09/12/2012   HCT 32.9* 09/12/2012   MCV 100.0 09/12/2012   PLT 246 09/12/2012    Microbiology: Recent Results (from the past 240 hour(s))  CULTURE, BLOOD (ROUTINE X 2)     Status: None   Collection Time    09/05/12  1:00 PM      Result Value Range Status   Specimen Description BLOOD HAND LEFT   Final   Special Requests BOTTLES DRAWN AEROBIC AND ANAEROBIC 4CC   Final   Culture  Setup Time 09/05/2012 16:21   Final   Culture     Final   Value: STAPHYLOCOCCUS SPECIES (COAGULASE NEGATIVE)     Note: THE SIGNIFICANCE OF ISOLATING THIS ORGANISM FROM A SINGLE SET OF BLOOD CULTURES WHEN MULTIPLE SETS ARE DRAWN IS UNCERTAIN.  PLEASE NOTIFY THE MICROBIOLOGY DEPARTMENT WITHIN ONE WEEK IF SPECIATION AND SENSITIVITIES ARE REQUIRED.     Note: Gram Stain Report Called to,Read Back By and Verified With: ANDREW Delford Field @ [phone] 09/06/12 BY KRAWS   Report Status 09/07/2012 FINAL   Final  CULTURE, BLOOD (ROUTINE X 2)     Status: None   Collection Time    09/05/12  4:45 PM      Result Value Range Status   Specimen Description BLOOD HEMODIALYSIS LINE   Final   Special Requests BOTTLES DRAWN AEROBIC AND ANAEROBIC 10CC   Final   Culture  Setup Time 09/06/2012 00:16   Final   Culture NO GROWTH 5 DAYS   Final   Report Status 09/12/2012 FINAL   Final  MRSA PCR SCREENING     Status: None   Collection Time    09/05/12  9:48 PM      Result Value Range Status   MRSA by PCR NEGATIVE  NEGATIVE Final   Comment:            The GeneXpert MRSA Assay (FDA     approved for NASAL specimens     only), is one component of a  comprehensive MRSA colonization     surveillance program. It is not     intended to diagnose MRSA     infection nor to guide or     monitor treatment for     MRSA infections.  GRAM STAIN     Status: None   Collection Time    09/06/12  2:32 PM      Result Value Range Status   Specimen Description ABSCESS BACK   Final   Special Requests DISC SPACE L 2 3   Final   Gram Stain     Final   Value: ABUNDANT WBC PRESENT,BOTH PMN AND MONONUCLEAR     NO ORGANISMS SEEN   Report Status 09/06/2012 FINAL   Final  CULTURE, ROUTINE-ABSCESS     Status: None   Collection Time    09/06/12  2:32 PM      Result Value Range Status   Specimen Description ABSCESS BACK   Final   Special Requests L 2 3 SPACE   Final   Gram Stain     Final   Value: ABUNDANT WBC PRESENT,BOTH PMN AND MONONUCLEAR     NO SQUAMOUS EPITHELIAL CELLS SEEN     NO ORGANISMS SEEN     Performed at [facility]   Culture NO GROWTH 3 DAYS   Final   Report Status 09/10/2012 FINAL   Final  CULTURE, ROUTINE-ABSCESS     Status: None   Collection Time     09/11/12 12:55 PM      Result Value Range Status   Specimen Description ABSCESS OTHER   Final   Special Requests RIGHT PSOAS ABSCESS   Final   Gram Stain     Final   Value: MODERATE WBC PRESENT,BOTH PMN AND MONONUCLEAR     NO SQUAMOUS EPITHELIAL CELLS SEEN     NO ORGANISMS SEEN   Culture NO GROWTH 2 DAYS   Final   Report Status PENDING   Incomplete  ANAEROBIC CULTURE     Status: None   Collection Time    09/11/12 12:56 PM      Result Value Range Status   Specimen Description ABSCESS OTHER   Final   Special Requests RIGHT PSOAS ABSCESS   Final   Gram Stain     Final   Value: MODERATE WBC PRESENT,BOTH PMN AND MONONUCLEAR     NO SQUAMOUS EPITHELIAL CELLS SEEN     NO ORGANISMS SEEN   Culture     Final   Value: NO ANAEROBES ISOLATED; CULTURE IN PROGRESS FOR 5 DAYS   Report Status PENDING   Incomplete   Assessment: All cultures from her lumbar disc aspirate and abscess aspirate remain negative. I expect improvement in her pain to be very very slow.  Plan: 1. Continue current antibiotics at least through August 13 and then reassess in our clinic  Cliffton Asters, MD [facility] [phone] pager   [phone] cell 09/14/2012, 12:42 PM

## 2012-09-14 NOTE — Progress Notes (Signed)
ANTIBIOTIC CONSULT NOTE - FOLLOW UP  Pharmacy Consult for Vancomycin and Cefepime Indication: Recurrent diskitis/osteomyelitis with psoas abscess; possible bacteremia   No Known Allergies  Patient Measurements: Height: 5\' 3"  (160 cm) Weight:  (UTO in recliner) IBW/kg (Calculated) : 52.4 Adjusted Body Weight:   Vital Signs: Temp: 98.6 F (37 C) (07/10 1245) Temp src: Oral (07/10 1245) BP: 177/76 mmHg (07/10 1400) Pulse Rate: 76 (07/10 1400) Intake/Output from previous day: 07/09 0701 - 07/10 0700 In: 360 [P.O.:360] Out: -  Intake/Output from this shift: Total I/O In: 360 [P.O.:360] Out: -   Labs:  Recent Labs  09/12/12 1257 09/14/12 1256  WBC 8.2 5.1  HGB 11.2* 9.8*  PLT 246 271  CREATININE 10.64* 6.67*   Estimated Creatinine Clearance: 4.9 ml/min (by C-G formula based on Cr of 6.67).  Recent Labs  09/14/12 1200  VANCORANDOM 8.6     Microbiology: Recent Results (from the past 720 hour(s))  CULTURE, BLOOD (ROUTINE X 2)     Status: None   Collection Time    09/05/12  1:00 PM      Result Value Range Status   Specimen Description BLOOD HAND LEFT   Final   Special Requests BOTTLES DRAWN AEROBIC AND ANAEROBIC 4CC   Final   Culture  Setup Time 09/05/2012 16:21   Final   Culture     Final   Value: STAPHYLOCOCCUS SPECIES (COAGULASE NEGATIVE)     Note: THE SIGNIFICANCE OF ISOLATING THIS ORGANISM FROM A SINGLE SET OF BLOOD CULTURES WHEN MULTIPLE SETS ARE DRAWN IS UNCERTAIN. PLEASE NOTIFY THE MICROBIOLOGY DEPARTMENT WITHIN ONE WEEK IF SPECIATION AND SENSITIVITIES ARE REQUIRED.     Note: Gram Stain Report Called to,Read Back By and Verified With: ANDREW Delford Field @ [phone] 09/06/12 BY KRAWS   Report Status 09/07/2012 FINAL   Final  CULTURE, BLOOD (ROUTINE X 2)     Status: None   Collection Time    09/05/12  4:45 PM      Result Value Range Status   Specimen Description BLOOD HEMODIALYSIS LINE   Final   Special Requests BOTTLES DRAWN AEROBIC AND ANAEROBIC 10CC   Final   Culture  Setup Time 09/06/2012 00:16   Final   Culture NO GROWTH 5 DAYS   Final   Report Status 09/12/2012 FINAL   Final  MRSA PCR SCREENING     Status: None   Collection Time    09/05/12  9:48 PM      Result Value Range Status   MRSA by PCR NEGATIVE  NEGATIVE Final   Comment:            The GeneXpert MRSA Assay (FDA     approved for NASAL specimens     only), is one component of a     comprehensive MRSA colonization     surveillance program. It is not     intended to diagnose MRSA     infection nor to guide or     monitor treatment for     MRSA infections.  GRAM STAIN     Status: None   Collection Time    09/06/12  2:32 PM      Result Value Range Status   Specimen Description ABSCESS BACK   Final   Special Requests DISC SPACE L 2 3   Final   Gram Stain     Final   Value: ABUNDANT WBC PRESENT,BOTH PMN AND MONONUCLEAR     NO ORGANISMS SEEN   Report Status 09/06/2012 FINAL  Final  CULTURE, ROUTINE-ABSCESS     Status: None   Collection Time    09/06/12  2:32 PM      Result Value Range Status   Specimen Description ABSCESS BACK   Final   Special Requests L 2 3 SPACE   Final   Gram Stain     Final   Value: ABUNDANT WBC PRESENT,BOTH PMN AND MONONUCLEAR     NO SQUAMOUS EPITHELIAL CELLS SEEN     NO ORGANISMS SEEN     Performed at [facility]   Culture NO GROWTH 3 DAYS   Final   Report Status 09/10/2012 FINAL   Final  CULTURE, ROUTINE-ABSCESS     Status: None   Collection Time    09/11/12 12:55 PM      Result Value Range Status   Specimen Description ABSCESS OTHER   Final   Special Requests RIGHT PSOAS ABSCESS   Final   Gram Stain     Final   Value: MODERATE WBC PRESENT,BOTH PMN AND MONONUCLEAR     NO SQUAMOUS EPITHELIAL CELLS SEEN     NO ORGANISMS SEEN   Culture NO GROWTH 2 DAYS   Final   Report Status PENDING   Incomplete  ANAEROBIC CULTURE     Status: None   Collection Time    09/11/12 12:56 PM      Result Value Range Status   Specimen Description ABSCESS  OTHER   Final   Special Requests RIGHT PSOAS ABSCESS   Final   Gram Stain     Final   Value: MODERATE WBC PRESENT,BOTH PMN AND MONONUCLEAR     NO SQUAMOUS EPITHELIAL CELLS SEEN     NO ORGANISMS SEEN   Culture     Final   Value: NO ANAEROBES ISOLATED; CULTURE IN PROGRESS FOR 5 DAYS   Report Status PENDING   Incomplete    Anti-infectives   Start     Dose/Rate Route Frequency Ordered Stop   09/14/12 1600  vancomycin (VANCOCIN) IVPB 750 mg/150 ml premix     750 mg 150 mL/hr over 60 Minutes Intravenous Every T-Th-Sa (Hemodialysis) 09/14/12 1425     09/07/12 1800  ceFEPIme (MAXIPIME) 2 g in dextrose 5 % 50 mL IVPB     2 g 100 mL/hr over 30 Minutes Intravenous Every T-Th-Sa (1800) 09/06/12 1540     09/07/12 1200  vancomycin (VANCOCIN) 500 mg in sodium chloride 0.9 % 100 mL IVPB  Status:  Discontinued     500 mg 100 mL/hr over 60 Minutes Intravenous Every T-Th-Sa (Hemodialysis) 09/06/12 1540 09/14/12 1425   09/06/12 1600  vancomycin (VANCOCIN) IVPB 1000 mg/200 mL premix     1,000 mg 200 mL/hr over 60 Minutes Intravenous  Once 09/06/12 1538 09/06/12 1903   09/06/12 1600  ceFEPIme (MAXIPIME) 2 g in dextrose 5 % 50 mL IVPB     2 g 100 mL/hr over 30 Minutes Intravenous  Once 09/06/12 1538 09/06/12 1649   09/05/12 1215  vancomycin (VANCOCIN) IVPB 1000 mg/200 mL premix  Status:  Discontinued     1,000 mg 200 mL/hr over 60 Minutes Intravenous  Once 09/05/12 1205 09/05/12 1217      Assessment: 80yof on Vancomycin and Cefepime Day 8 for recurrent diskitis/osteomyelitis and psoas abscess. Patient is currently afebrile, WBC wnl and cultures have revealed no significant growth. Vancomycin level checked prior to HD session today was subtherapeutic at 8.6 mcg/ml - will increase Vancomycin to 750mg  after each hemodialysis and recommend to check follow-up  level next week. Continue current Cefepime regimen - dosed appropriately for HD. ID has recommended to continue Vancomycin and Cefepime through August  13th (minimum of 6 weeks).    Goal of Therapy:  Pre-HD Vancomycin level: 15-25 mcg/ml  Plan:  1. Increase Vancomycin to 750mg  IV qHD (TTS) 2. Continue Cefepime 2g IV qHD (TTS) 3. Check follow-up Vancomycin pre-HD level next week 4. Monitor renal schedule, cultures, vitals and clinical course  Cleon Dew [phone] 09/14/2012,2:34 PM

## 2012-09-14 NOTE — Discharge Summary (Signed)
  Date: 09/14/2012  Patient name: Nicole [name]  Medical record number: [id number]  Date of birth: 07/11/1932   This patient has been seen and the plan of care was discussed with the house staff. Please see their note for complete details. I concur with their findings and plan.  Jonah Blue, DO 09/14/2012, 3:59 PM

## 2012-09-14 NOTE — Clinical Social Work Note (Signed)
CSW talked with [facility] and they could accept patient pending insurance pre-authorization. CSW advised by admissions director that clinicals forwarded to insurance company and she anticipates receiving authorization on Friday. CSW also spoke with son several times during the day regarding placement at [facility] and insurance authorization.   Genelle Bal, MSW, LCSW [phone]

## 2012-09-14 NOTE — Progress Notes (Signed)
Subjective: wanting Emla cream before hd cannulation , back pain still better Objective Vital signs in last 24 hours: Filed Vitals:   09/13/12 2025 09/14/12 0532 09/14/12 0744 09/14/12 0900  BP: 150/75 139/70 164/66 148/60  Pulse: 65 71 58 62  Temp: 97.9 F (36.6 C) 97.8 F (36.6 C) 97.5 F (36.4 C) 97.3 F (36.3 C)  TempSrc: Oral Oral Oral Oral  Resp: 16 16 15 16   Height:      Weight: 44.169 kg (97 lb 6 oz)     SpO2: 96% 97% 96% 98%   Weight change: 1.369 kg (3 lb 0.3 oz)  Intake/Output Summary (Last 24 hours) at 09/14/12 0905 Last data filed at 09/14/12 0800  Gross per 24 hour  Intake    600 ml  Output      0 ml  Net    600 ml   Labs: Basic Metabolic Panel:  Recent Labs Lab 09/08/12 0733  09/10/12 0906 09/11/12 0500 09/12/12 1257  NA 135  < > 138 139 136  K 4.1  < > 3.7 4.0 3.4*  CL 94*  < > 95* 95* 90*  CO2 24  < > 29 29 28   GLUCOSE 107*  < > 113* 97 120*  BUN 48*  < > 19 29* 43*  CREATININE 12.67*  < > 5.69* 7.51* 10.64*  CALCIUM 9.3  < > 9.6 9.6 9.6  9.1  PHOS 4.6  --   --  5.1* 4.6  < > = values in this interval not displayed. Liver Function Tests:  Recent Labs Lab 09/08/12 0733 09/11/12 0500 09/12/12 1257  ALBUMIN 2.8* 2.8* 2.8*   CBC:  Recent Labs Lab 09/08/12 0733 09/09/12 0730 09/12/12 1257  WBC 5.7 6.1 8.2  HGB 11.8* 11.6* 11.2*  HCT 34.3* 33.0* 32.9*  MCV 99.4 98.5 100.0  PLT 296 346 246    CBG:  Recent Labs Lab 09/08/12 1437 09/08/12 1725  GLUCAP 150* 121*    Physical Exam:  General:Alert, nad ,but anxious  About avgg pain Heart: RRR  Lungs: CTA  bilat. Abdomen: BS pos. ,soft ,nontender  Extremities: Dialysis Access: no pedal edema./pos. Bruit R FA AVGG   Dialysis Orders ([facility] TTS)  F160 4hr 2.25Ca EDW 52kg RFA AVG Heparin 6000  Hectorol 1ug, no Epo - last dose 9000 6/14 at op center,on venofer 50/week  Labs: Hgb 13.3 Feb109 and 61%sat 6/28, - iPTH 196 in April   Assessment / plan  1 Discitis / psoas abcess- on  Vanco and Maxipime per ID noted  August 13 last dose. / admit teamRX  2 ESRD-, hd TTS [facility] hd today  In Recliner 3.4 k tues. Fu pre hd use 4kbath to start  3 HTN- BP 148/60  this am/.uf 2 l  With hd / eating better with back pain resolved / Lisinopril 20mg  hs/ she is belowprior op EDW  4 Anemia- Hb 11.6> 11.2 no epo  5 Sec HPT- on vit D, pth now 87,<196on Apr. 2014 , hold vit D, Ca10.8 corec. Phos 4.6, on 2 phos lo ac  6 Hypothyroidism- synthroid 150mg ,TSH ^ on admit/ compliance questionable  7 Hx pe  8. PVD   Lenny Pastel, PA-C [facility] Beeper [phone] 09/14/2012,9:05 AM  LOS: 9 days   Planning for HD today butr refused due to need for local anesthetic.  Back pain is better and she is overall improved. For HD today and d/c soon.  On antibiotics that can be given during HD.  Andric Kerce C  

## 2012-09-14 NOTE — Progress Notes (Signed)
Subjective: Patient reports feeling better, reports PO pain meds are relieving pain.  Patient reported she does not like going to HD because of pain associated.  Encouraged patient to have HD.  No fever/ chills/ abdominal pain.  Reports ate well last night. Objective: Vital signs in last 24 hours: Filed Vitals:   09/14/12 0744 09/14/12 0900 09/14/12 1000 09/14/12 1245  BP: 164/66 148/60 150/68   Pulse: 58 62 66   Temp: 97.5 F (36.4 C) 97.3 F (36.3 C) 98 F (36.7 C) 98.6 F (37 C)  TempSrc: Oral Oral Oral Oral  Resp: 15 16 16 15   Height:      Weight:      SpO2: 96% 98% 97% 96%   Weight change: 3 lb 0.3 oz (1.369 kg)  Intake/Output Summary (Last 24 hours) at 09/14/12 1254 Last data filed at 09/14/12 0900  Gross per 24 hour  Intake    720 ml  Output      0 ml  Net    720 ml   General: resting in chair about to go to HD, NAD  HEENT: EOMI  Cardiac: RRR, no rubs, murmurs or gallops  Pulm: clear to auscultation bilaterally Abd: soft, nontender, nondistended, BS present  Ext: warm and well perfused, no pedal edema; no tenderness to left thigh  Neuro: alert and oriented X2, lower extremity sensation intact.  Lab Results: Basic Metabolic Panel:  Recent Labs Lab 09/11/12 0500 09/12/12 1257  NA 139 136  K 4.0 3.4*  CL 95* 90*  CO2 29 28  GLUCOSE 97 120*  BUN 29* 43*  CREATININE 7.51* 10.64*  CALCIUM 9.6 9.6  9.1  PHOS 5.1* 4.6   Liver Function Tests:  Recent Labs Lab 09/11/12 0500 09/12/12 1257  ALBUMIN 2.8* 2.8*   CBC:  Recent Labs Lab 09/09/12 0730 09/12/12 1257  WBC 6.1 8.2  HGB 11.6* 11.2*  HCT 33.0* 32.9*  MCV 98.5 100.0  PLT 346 246   CBG:  Recent Labs Lab 09/08/12 1437 09/08/12 1725  GLUCAP 150* 121*   Thyroid Function Tests:  Recent Labs Lab 09/11/12 0500  FREET4 1.02    Micro Results: Recent Results (from the past 240 hour(s))  CULTURE, BLOOD (ROUTINE X 2)     Status: None   Collection Time    09/05/12  1:00 PM   Result Value Range Status   Specimen Description BLOOD HAND LEFT   Final   Special Requests BOTTLES DRAWN AEROBIC AND ANAEROBIC 4CC   Final   Culture  Setup Time 09/05/2012 16:21   Final   Culture     Final   Value: STAPHYLOCOCCUS SPECIES (COAGULASE NEGATIVE)     Note: THE SIGNIFICANCE OF ISOLATING THIS ORGANISM FROM A SINGLE SET OF BLOOD CULTURES WHEN MULTIPLE SETS ARE DRAWN IS UNCERTAIN. PLEASE NOTIFY THE MICROBIOLOGY DEPARTMENT WITHIN ONE WEEK IF SPECIATION AND SENSITIVITIES ARE REQUIRED.     Note: Gram Stain Report Called to,Read Back By and Verified With: Marney Setting @ [phone] 09/06/12 BY KRAWS   Report Status 09/07/2012 FINAL   Final  CULTURE, BLOOD (ROUTINE X 2)     Status: None   Collection Time    09/05/12  4:45 PM      Result Value Range Status   Specimen Description BLOOD HEMODIALYSIS LINE   Final   Special Requests BOTTLES DRAWN AEROBIC AND ANAEROBIC 10CC   Final   Culture  Setup Time 09/06/2012 00:16   Final   Culture NO GROWTH 5 DAYS  Final   Report Status 09/12/2012 FINAL   Final  MRSA PCR SCREENING     Status: None   Collection Time    09/05/12  9:48 PM      Result Value Range Status   MRSA by PCR NEGATIVE  NEGATIVE Final   Comment:            The GeneXpert MRSA Assay (FDA     approved for NASAL specimens     only), is one component of a     comprehensive MRSA colonization     surveillance program. It is not     intended to diagnose MRSA     infection nor to guide or     monitor treatment for     MRSA infections.  GRAM STAIN     Status: None   Collection Time    09/06/12  2:32 PM      Result Value Range Status   Specimen Description ABSCESS BACK   Final   Special Requests DISC SPACE L 2 3   Final   Gram Stain     Final   Value: ABUNDANT WBC PRESENT,BOTH PMN AND MONONUCLEAR     NO ORGANISMS SEEN   Report Status 09/06/2012 FINAL   Final  CULTURE, ROUTINE-ABSCESS     Status: None   Collection Time    09/06/12  2:32 PM      Result Value Range Status   Specimen  Description ABSCESS BACK   Final   Special Requests L 2 3 SPACE   Final   Gram Stain     Final   Value: ABUNDANT WBC PRESENT,BOTH PMN AND MONONUCLEAR     NO SQUAMOUS EPITHELIAL CELLS SEEN     NO ORGANISMS SEEN     Performed at [facility]   Culture NO GROWTH 3 DAYS   Final   Report Status 09/10/2012 FINAL   Final  CULTURE, ROUTINE-ABSCESS     Status: None   Collection Time    09/11/12 12:55 PM      Result Value Range Status   Specimen Description ABSCESS OTHER   Final   Special Requests RIGHT PSOAS ABSCESS   Final   Gram Stain     Final   Value: MODERATE WBC PRESENT,BOTH PMN AND MONONUCLEAR     NO SQUAMOUS EPITHELIAL CELLS SEEN     NO ORGANISMS SEEN   Culture NO GROWTH 2 DAYS   Final   Report Status PENDING   Incomplete  ANAEROBIC CULTURE     Status: None   Collection Time    09/11/12 12:56 PM      Result Value Range Status   Specimen Description ABSCESS OTHER   Final   Special Requests RIGHT PSOAS ABSCESS   Final   Gram Stain     Final   Value: MODERATE WBC PRESENT,BOTH PMN AND MONONUCLEAR     NO SQUAMOUS EPITHELIAL CELLS SEEN     NO ORGANISMS SEEN   Culture     Final   Value: NO ANAEROBES ISOLATED; CULTURE IN PROGRESS FOR 5 DAYS   Report Status PENDING   Incomplete   Studies/Results: No results found. Medications: I have reviewed the patient's current medications. Scheduled Meds: . calcium acetate  1,334 mg Oral TID WC  . ceFEPime (MAXIPIME) IV  2 g Intravenous Q T,Th,Sat-1800  . docusate sodium  100 mg Oral BID  . feeding supplement (NEPRO CARB STEADY)  237 mL Oral BID BM  . heparin  5,000 Units  Subcutaneous Q8H  . levothyroxine  150 mcg Oral QAC breakfast  . lisinopril  20 mg Oral QHS  . multivitamin  1 tablet Oral QHS  . polyethylene glycol  17 g Oral BID  . sodium chloride  3 mL Intravenous Q12H  . 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  -Pain control Norco 10-325 q3hrs prn.  -PT/OT continue to treat, patient encouraged to request pain medication if pain prohibits participation in PT/OT.  -Discharge to SNF today -CRP 3.7, Sed rate 54. Will monitor for effectiveness of Abx treatment.  ESRD (end stage renal disease) on dialysis  -Normal Dialysis schedule is T, TH, S. (She follows with Dr. Geanie Berlin, HD at [facility]) Cont normal schedule.  Hypothyroidism  -Continue home synthroid dose of .  -TSH on 09/05/12 is 114. Unlikely that patient is compliant with home medication. PCP to recheck TSH as outpatient. Anemia in chronic kidney disease(285.21)  -Patient with ESRD, normally hemoglobin runs 8-9 currently hemoglobin is 12.4 before HD. Will continue to monitor.  -Hgb stable 7/8 was 11.2, no need for EPO.  Severe spinal stenosis at L2-3  -Unchanged compared to previous MRI.  -Pain control with PRN pain medication  Accelerated Hypertension-resolved  -Remains elevated, home HTN meds resumed. Continue Dialysis  -Continue to monitor.  Bradycardia-resolved  #Code Status: full code  Dispo: Discharge to SNF today. The patient does have a current PCP [name], MD) and does not need an [facility] hospital follow-up appointment after discharge.  The patient does not have transportation limitations that hinder transportation to clinic appointments.   .Services Needed at time of discharge: Y = Yes, Blank = No PT:   OT:   RN:   Equipment:   Other:     LOS: 9 days   Carlynn Purl, DO 09/14/2012, 12:54 PM

## 2012-09-14 NOTE — Progress Notes (Signed)
  Date: 09/14/2012  Patient name: Nicole [name]  Medical record number: [id number]  Date of birth: 03/25/1932   This patient has been seen and the plan of care was discussed with the house staff. Please see their note for complete details. I concur with their findings with the following additions/corrections:  Doing well today. No fever. Cultures continue to be negative. Pain is better control. ID will follow up with her. Continue current abx treatment with vanc and cefepime with HD until at least Aug 13th. Medically stable for D/C to SNF.  Jonah Blue, DO 09/14/2012, 2:21 PM

## 2012-09-15 LAB — CULTURE, ROUTINE-ABSCESS: Culture: NO GROWTH

## 2012-09-15 NOTE — Progress Notes (Signed)
Occupational Therapy Treatment Patient Details Name: Nicole [name] MRN: [id number] DOB: 1932/09/03 Today's Date: 09/15/2012 Time: 4401-0272 OT Time Calculation (min): 26 min  OT Assessment / Plan / Recommendation  OT comments  Pt continues to make progress  Follow Up Recommendations  SNF       Equipment Recommendations  3 in 1 bedside comode       Frequency Min 2X/week      Plan Discharge plan remains appropriate    Precautions / Restrictions   fall      ADL  Grooming: Performed;Shaving;Teeth care;Supervision/safety Where Assessed - Grooming: Unsupported standing Toilet Transfer: Research scientist (life sciences) Method: Sit to Barista: Bedside commode (over toilet) Toileting - Clothing Manipulation and Hygiene: Performed;Supervision/safety Where Assessed - Engineer, mining and Hygiene: Standing Equipment Used: Rolling walker Transfers/Ambulation Related to ADLs: S for all with RW      OT Goals(current goals can now be found in the care plan section)    Visit Information  Last OT Received On: 09/15/12 Assistance Needed: +1 History of Present Illness: Nicole [name] is a 79 year old female with a PMH significant for ESRD, PVD, Hypothyroidism, Iron deficiency anemia, depression, and AAA.  She presented to [facility] ED for lower back pain for the past 3 days.  She reports the pain is sharp, constant, worsening, 10/10, radiates down the front of right leg.  The pain in back/legs feels like never before (worse than in January); she did not try any medications to help with pain. Denies fever/chills.  Found to have Osteomyelitis of lumbar spine.          Cognition  Cognition Arousal/Alertness: Awake/alert Behavior During Therapy: WFL for tasks assessed/performed Overall Cognitive Status: Within Functional Limits for tasks assessed    Mobility  Bed Mobility Bed Mobility: Supine to Sit;Sitting - Scoot to Edge of Bed Supine to  Sit: 6: Modified independent (Device/Increase time);With rails;HOB elevated Sitting - Scoot to Edge of Bed: 6: Modified independent (Device/Increase time);With rail Transfers Transfers: Sit to Stand;Stand to Sit Sit to Stand: 5: Supervision;With upper extremity assist;From bed Stand to Sit: 5: Supervision;With upper extremity assist;With armrests;To chair/3-in-1          End of Session OT - End of Session Equipment Utilized During Treatment: Gait belt;Rolling walker Activity Tolerance: Patient tolerated treatment well Patient left: with call bell/phone within reach;with bed alarm set (sitting EOB eating lunch)       Evette Georges  [phone]  09/15/2012, 2:02 PM

## 2012-09-15 NOTE — Progress Notes (Signed)
Physical Therapy Treatment Patient Details Name: Nicole [name] MRN: [id number] DOB: 27-Apr-1932 Today's Date: 09/15/2012 Time: 4098-1191 PT Time Calculation (min): 28 min  PT Assessment / Plan / Recommendation  PT Comments   Pt able to increase ambulation distance a little today however continues to report increased pain as limiting factor but declines pain meds.  Pt also performed exercises in supine to tolerance.    Follow Up Recommendations  Supervision/Assistance - 24 hour;SNF     Does the patient have the potential to tolerate intense rehabilitation     Barriers to Discharge        Equipment Recommendations  Rolling walker with 5" wheels (youth)    Recommendations for Other Services    Frequency     Progress towards PT Goals Progress towards PT goals: Progressing toward goals  Plan Current plan remains appropriate    Precautions / Restrictions Precautions Precautions: Fall Precaution Comments: dizzy with mobility   Pertinent Vitals/Pain Max c/o back pain with mobility, repositioned to comfort, declines pain meds but aware to use call bell if she changes her mind    Mobility  Bed Mobility Bed Mobility: Supine to Sit;Sitting - Scoot to [place] of Bed;Sit to Supine;Scooting to [facility] Supine to Sit: 6: Modified independent (Device/Increase time);With rails;HOB elevated Sitting - Scoot to Edge of Bed: 6: Modified independent (Device/Increase time);With rail Sit to Supine: 6: Modified independent (Device/Increase time) Scooting to [facility]: 5: Set up Transfers Transfers: Sit to Stand;Stand to Sit Sit to Stand: With upper extremity assist;From bed;4: Min guard Stand to Sit: Without upper extremity assist;4: Min guard;To bed Details for Transfer Assistance: verbal cues for hand placement, pt with uncontrolled descent to bed due to fatigue Ambulation/Gait Ambulation/Gait Assistance: 4: Min guard Ambulation Distance (Feet): 60 Feet Assistive device: Rolling walker Ambulation/Gait  Assistance Details: verbal cues for posture and safe use of RW, pt reports increased fatigue and back pain so returned to bed, pt reported dizziness upon sitting on bed after ambulation Gait Pattern: Step-through pattern;Decreased stride length;Trunk flexed Gait velocity: decreased    Exercises General Exercises - Lower Extremity Ankle Circles/Pumps: AROM;Both;20 reps;Supine Quad Sets: AROM;20 reps;Both Short Arc Quad: AROM;AAROM;Both;15 reps;Other (comment) (AAROM for R LE) Heel Slides: AAROM;Both;15 reps Hip ABduction/ADduction: AROM;AAROM;Both;10 reps;Supine;Other (comment) (AAROM for R LE)   PT Diagnosis:    PT Problem List:   PT Treatment Interventions:     PT Goals (current goals can now be found in the care plan section)    Visit Information  Last PT Received On: 09/15/12 Assistance Needed: +1    Subjective Data      Cognition  Cognition Arousal/Alertness: Awake/alert Behavior During Therapy: WFL for tasks assessed/performed Overall Cognitive Status: Within Functional Limits for tasks assessed    Balance     End of Session PT - End of Session Activity Tolerance: Patient limited by pain;Patient limited by fatigue Patient left: in bed;with call bell/phone within reach   GP     [facility] E 09/15/2012, 9:44 AM Zenovia Jarred, PT, DPT 09/15/2012 Pager: [phone]

## 2012-09-15 NOTE — Progress Notes (Signed)
Report given to Clydie Braun at [facility]. IV removed. Pt notified of transfer back to [facility], she is agreeable. Assessment unchanged from morning. Pt awaiting transport.

## 2012-09-15 NOTE — Progress Notes (Signed)
Subjective: Feeling better.  Back pain relieved by PO pain meds.  Cream for dialysis helped a lot.  Did not eat this morning. Encouraged drinking supplements even if she does not eat her meal. Objective: Vital signs in last 24 hours: Filed Vitals:   09/14/12 1651 09/14/12 1652 09/14/12 1716 09/14/12 2044  BP: 99/58 126/56 143/62 126/56  Pulse: 78 75 72 76  Temp:  97.7 F (36.5 C) 97.3 F (36.3 C) 98.2 F (36.8 C)  TempSrc:  Oral Oral Oral  Resp: 16 17 17 16   Height:      Weight:      SpO2:  97% 100% 100%   Weight change:   Intake/Output Summary (Last 24 hours) at 09/15/12 0813 Last data filed at 09/14/12 1652  Gross per 24 hour  Intake    120 ml  Output   1500 ml  Net  -1380 ml   General: laying in bed, NAD  HEENT: EOMI  Cardiac: RRR, no rubs, murmurs or gallops  Pulm: clear to auscultation bilaterally  Abd: soft, nontender, nondistended, BS present  Ext: warm and well perfused, no pedal edema; no tenderness to left thigh, admits more pain when trying to hold left leg in air vs right leg. Neuro: alert and oriented to person, place and year, lower extremity sensation intact.  Lab Results: Basic Metabolic Panel:  Recent Labs Lab 09/11/12 0500 09/12/12 1257 09/14/12 1256  NA 139 136 134*  K 4.0 3.4* 4.4  CL 95* 90* 100  CO2 29 28 19   GLUCOSE 97 120* 77  BUN 29* 43* 22  CREATININE 7.51* 10.64* 6.67*  CALCIUM 9.6 9.6  9.1 7.9*  PHOS 5.1* 4.6  --    Liver Function Tests:  Recent Labs Lab 09/11/12 0500 09/12/12 1257  ALBUMIN 2.8* 2.8*   CBC:  Recent Labs Lab 09/12/12 1257 09/14/12 1256  WBC 8.2 5.1  HGB 11.2* 9.8*  HCT 32.9* 28.6*  MCV 100.0 97.6  PLT 246 271   CBG:  Recent Labs Lab 09/08/12 1437 09/08/12 1725  GLUCAP 150* 121*   Thyroid Function Tests:  Recent Labs Lab 09/11/12 0500  FREET4 1.02    Micro Results: Recent Results (from the past 240 hour(s))  CULTURE, BLOOD (ROUTINE X 2)     Status: None   Collection Time   09/05/12  1:00 PM      Result Value Range Status   Specimen Description BLOOD HAND LEFT   Final   Special Requests BOTTLES DRAWN AEROBIC AND ANAEROBIC 4CC   Final   Culture  Setup Time 09/05/2012 16:21   Final   Culture     Final   Value: STAPHYLOCOCCUS SPECIES (COAGULASE NEGATIVE)     Note: THE SIGNIFICANCE OF ISOLATING THIS ORGANISM FROM A SINGLE SET OF BLOOD CULTURES WHEN MULTIPLE SETS ARE DRAWN IS UNCERTAIN. PLEASE NOTIFY THE MICROBIOLOGY DEPARTMENT WITHIN ONE WEEK IF SPECIATION AND SENSITIVITIES ARE REQUIRED.     Note: Gram Stain Report Called to,Read Back By and Verified With: Marney Setting @ [phone] 09/06/12 BY KRAWS   Report Status 09/07/2012 FINAL   Final  CULTURE, BLOOD (ROUTINE X 2)     Status: None   Collection Time    09/05/12  4:45 PM      Result Value Range Status   Specimen Description BLOOD HEMODIALYSIS LINE   Final   Special Requests BOTTLES DRAWN AEROBIC AND ANAEROBIC 10CC   Final   Culture  Setup Time 09/06/2012 00:16   Final   Culture  NO GROWTH 5 DAYS   Final   Report Status 09/12/2012 FINAL   Final  MRSA PCR SCREENING     Status: None   Collection Time    09/05/12  9:48 PM      Result Value Range Status   MRSA by PCR NEGATIVE  NEGATIVE Final   Comment:            The GeneXpert MRSA Assay (FDA     approved for NASAL specimens     only), is one component of a     comprehensive MRSA colonization     surveillance program. It is not     intended to diagnose MRSA     infection nor to guide or     monitor treatment for     MRSA infections.  GRAM STAIN     Status: None   Collection Time    09/06/12  2:32 PM      Result Value Range Status   Specimen Description ABSCESS BACK   Final   Special Requests DISC SPACE L 2 3   Final   Gram Stain     Final   Value: ABUNDANT WBC PRESENT,BOTH PMN AND MONONUCLEAR     NO ORGANISMS SEEN   Report Status 09/06/2012 FINAL   Final  CULTURE, ROUTINE-ABSCESS     Status: None   Collection Time    09/06/12  2:32 PM      Result Value  Range Status   Specimen Description ABSCESS BACK   Final   Special Requests L 2 3 SPACE   Final   Gram Stain     Final   Value: ABUNDANT WBC PRESENT,BOTH PMN AND MONONUCLEAR     NO SQUAMOUS EPITHELIAL CELLS SEEN     NO ORGANISMS SEEN     Performed at [facility]   Culture NO GROWTH 3 DAYS   Final   Report Status 09/10/2012 FINAL   Final  CULTURE, ROUTINE-ABSCESS     Status: None   Collection Time    09/11/12 12:55 PM      Result Value Range Status   Specimen Description ABSCESS OTHER   Final   Special Requests RIGHT PSOAS ABSCESS   Final   Gram Stain     Final   Value: MODERATE WBC PRESENT,BOTH PMN AND MONONUCLEAR     NO SQUAMOUS EPITHELIAL CELLS SEEN     NO ORGANISMS SEEN   Culture NO GROWTH 3 DAYS   Final   Report Status 09/15/2012 FINAL   Final  ANAEROBIC CULTURE     Status: None   Collection Time    09/11/12 12:56 PM      Result Value Range Status   Specimen Description ABSCESS OTHER   Final   Special Requests RIGHT PSOAS ABSCESS   Final   Gram Stain     Final   Value: MODERATE WBC PRESENT,BOTH PMN AND MONONUCLEAR     NO SQUAMOUS EPITHELIAL CELLS SEEN     NO ORGANISMS SEEN   Culture     Final   Value: NO ANAEROBES ISOLATED; CULTURE IN PROGRESS FOR 5 DAYS   Report Status PENDING   Incomplete   Studies/Results: No results found. Medications: I have reviewed the patient's current medications. Scheduled Meds: . calcium acetate  1,334 mg Oral TID WC  . ceFEPime (MAXIPIME) IV  2 g Intravenous Q T,Th,Sat-1800  . docusate sodium  100 mg Oral BID  . feeding supplement (NEPRO CARB STEADY)  237 mL Oral BID  BM  . heparin  5,000 Units Subcutaneous Q8H  . levothyroxine  150 mcg Oral QAC breakfast  . lisinopril  20 mg Oral QHS  . multivitamin  1 tablet Oral QHS  . polyethylene glycol  17 g Oral BID  . sodium chloride  3 mL Intravenous Q12H  . vancomycin  750 mg Intravenous Q T,Th,Sa-HD   Continuous Infusions: none PRN Meds:.HYDROcodone-acetaminophen,  lidocaine-prilocaine Assessment/Plan: Osteomyelitis/Discitis of lumbar spine with B/L Psoas abscess  -Blood cultures and CT aspirate have had NGTD (except 1 set of blood cultures growing coag neg staph, likely contaminate)  - Vanc and Cefepime started 7/2 per pharmacy will continue 6 weeks per ID until August 13th.  -Pain control Norco 10-325 q3hrs prn.  -PT/OT continue to treat, patient encouraged to request pain medication if pain prohibits participation in PT/OT.  -Discharge to SNF today  -CRP 3.7, Sed rate 54. Will monitor for effectiveness of Abx treatment.  ESRD (end stage renal disease) on dialysis  -Normal Dialysis schedule is T, TH, S. (She follows with Dr. Geanie Berlin, HD at [facility]) Cont normal schedule. Hypothyroidism  -Continue home synthroid dose of .  -TSH on 09/05/12 is 114. Unlikely that patient is compliant with home medication. PCP to recheck TSH as outpatient.  Anemia in chronic kidney disease(285.21)  -Patient with ESRD, normally hemoglobin runs 8-9 currently hemoglobin is 12.4 before HD. Will continue to monitor.  -Hgb stable 7/8 was 11.2, no need for EPO.  Severe spinal stenosis at L2-3  -Unchanged compared to previous MRI.  -Pain control with PRN pain medication  Accelerated Hypertension-resolved  -Remains elevated, home HTN meds resumed. Continue Dialysis  -Continue to monitor.  Bradycardia-resolved  #Code Status: full code  Dispo: Discharge to SNF today.  The patient does have a current PCP [name], MD) and does not need an [facility] hospital follow-up appointment after discharge.  The patient does not have transportation limitations that hinder transportation to clinic appointments.   .Services Needed at time of discharge: Y = Yes, Blank = No PT: Y  OT:   RN:   Equipment: Y bedside commode, rolling walker with 5in wheels  Other:     LOS: 10 days   Carlynn Purl, DO 09/15/2012, 8:13 AM

## 2012-09-15 NOTE — Progress Notes (Signed)
[facility] Progress Note  Subjective:   Smiling. Pain controlled. No c/o's  Objective Filed Vitals:   09/14/12 1651 09/14/12 1652 09/14/12 1716 09/14/12 2044  BP: 99/58 126/56 143/62 126/56  Pulse: 78 75 72 76  Temp:  97.7 F (36.5 C) 97.3 F (36.3 C) 98.2 F (36.8 C)  TempSrc:  Oral Oral Oral  Resp: 16 17 17 16   Height:      Weight:      SpO2:  97% 100% 100%   Physical Exam General: alert, cooperative, NAD Heart: RRR Lungs: CTA bilaterally, no wheezes, rales, rhonchi Abdomen: soft, NT, normal BS Extremities: No LE edema Dialysis Access: RFA AVG + bruit  Dialysis Orders ([facility] TTS)  F160 4hr 2.25Ca EDW 52kg RFA AVG Heparin 6000  Hectorol 1ug, no Epo - last dose 9000 6/14 at op center,on venofer 50/week  Labs: Hgb 13.3 Feb109 and 61%sat 6/28, - iPTH 196 in April   Assessment/Plan: 1. Osteomyelitis/Discitis of lumbar spine with B/L Psoas abscess -  Per primary. BC and CT aspirate negative to date (except one set of cultures growing coag neg staph/ likely contaminate) On Vanc and Cefemine per ID through 8/13. 2. ESRD - TTS. SNF placement pending, HD orders for tomorrow 1st shift here in case she is still admitted. Recliner 3. Anemia - Hgb 9.8 < 11.6. ? Large chg. Repeat CBC. 4. Secondary hyperparathyroidism - Ca 7.9 ( 8.9 corrected) Phos  4.6. PTH now 87 < 196 in April. Vit D held. Continue phoslo. 5. HTN/volume - SBPs 99-140's. On Lisinopril. Net UF 1.5L on 7/10, Significantly below op EDW. Will need adjustment at d/c 6. Nutrition - Alb 2.8. Poor po intake, Renal diet, multivitamin, nepro 7. Hypothyroidism - On synthroid 150 mcg 8. Hx PE 9. PVD 10. Dispo - Discharge to [facility] SNF pending ins pre-auth. ? today  Scot Jun. Thad Ranger [facility] Pager [phone] 09/15/2012,9:05 AM  LOS: 10 days   Renal Attending: She is stable from renal perspective.  We will plan HD in AM.  She seems to think she will go back to [facility] Sat. Ans SW notes  allude to this possibility pending financial acceptance.  Will plan for early HD. Tomicka Lover C   Additional Objective Labs: Basic Metabolic Panel:  Recent Labs Lab 09/11/12 0500 09/12/12 1257 09/14/12 1256  NA 139 136 134*  K 4.0 3.4* 4.4  CL 95* 90* 100  CO2 29 28 19   GLUCOSE 97 120* 77  BUN 29* 43* 22  CREATININE 7.51* 10.64* 6.67*  CALCIUM 9.6 9.6  9.1 7.9*  PHOS 5.1* 4.6  --    Liver Function Tests:  Recent Labs Lab 09/11/12 0500 09/12/12 1257  ALBUMIN 2.8* 2.8*   No results found for this basename: LIPASE, AMYLASE,  in the last 168 hours CBC:  Recent Labs Lab 09/09/12 0730 09/12/12 1257 09/14/12 1256  WBC 6.1 8.2 5.1  HGB 11.6* 11.2* 9.8*  HCT 33.0* 32.9* 28.6*  MCV 98.5 100.0 97.6  PLT 346 246 271   Blood Culture    Component Value Date/Time   SDES ABSCESS OTHER 09/11/2012 1256   SPECREQUEST RIGHT PSOAS ABSCESS 09/11/2012 1256   CULT NO ANAEROBES ISOLATED; CULTURE IN PROGRESS FOR 5 DAYS 09/11/2012 1256   REPTSTATUS PENDING 09/11/2012 1256    Cardiac Enzymes: No results found for this basename: CKTOTAL, CKMB, CKMBINDEX, TROPONINI,  in the last 168 hours CBG:  Recent Labs Lab 09/08/12 1437 09/08/12 1725  GLUCAP 150* 121*   Iron Studies:  No results found for this basename: IRON, TIBC, TRANSFERRIN, FERRITIN,  in the last 72 hours @lablastinr3 @ Studies/Results: No results found. Medications:   . calcium acetate  1,334 mg Oral TID WC  . ceFEPime (MAXIPIME) IV  2 g Intravenous Q T,Th,Sat-1800  . docusate sodium  100 mg Oral BID  . feeding supplement (NEPRO CARB STEADY)  237 mL Oral BID BM  . heparin  5,000 Units Subcutaneous Q8H  . levothyroxine  150 mcg Oral QAC breakfast  . lisinopril  20 mg Oral QHS  . multivitamin  1 tablet Oral QHS  . polyethylene glycol  17 g Oral BID  . sodium chloride  3 mL Intravenous Q12H  . vancomycin  750 mg Intravenous Q T,Th,Sa-HD

## 2012-09-15 NOTE — Progress Notes (Signed)
  Date: 09/15/2012  Patient name: Nicole [name]  Medical record number: [id number]  Date of birth: 1932/09/21   This patient has been seen and the plan of care was discussed with the house staff. Please see their note for complete details. I concur with their findings with the following additions/corrections:  No complaints today. She had HD yesterday. Medically stable. Will need to continue treatment with Vanc and Cefepime through August 13th then follow up with ID. She is medically stable for discharge to facility. S/P drainage of psoas abscess and findings of lumbar osteomyelitis.   Jonah Blue, DO 09/15/2012, 1:11 PM

## 2012-09-15 NOTE — Clinical Social Work Placement (Addendum)
Clinical Social Work Department CLINICAL SOCIAL WORK PLACEMENT NOTE 09/15/2012  Patient:  Nicole [name], Nicole [name]  Account Number:  [id number] Admit date:  09/05/2012  Clinical Social Worker:  Genelle Bal, LCSW  Date/time:  09/15/2012 04:01 AM  Clinical Social Work is seeking post-discharge placement for this patient at the following level of care:   SKILLED NURSING   (*CSW will update this form in Epic as items are completed)     Patient/family provided with [facility] Department of Clinical Social Work's list of facilities offering this level of care within the geographic area requested by the patient (or if unable, by the patient's family).  09/13/2012  Patient/family informed of their freedom to choose among providers that offer the needed level of care, that participate in Medicare, Medicaid or managed care program needed by the patient, have an available bed and are willing to accept the patient.    Patient/family informed of [facility]' ownership interest in [facility], as well as of the fact that they are under no obligation to receive care at this facility.  PASARR submitted to EDS on 03/28/12 PASARR number received from [employer] on 03/28/12  FL2 transmitted to all facilities in geographic area requested by pt/family on  09/13/2012 FL2 transmitted to all facilities within larger geographic area on   Patient informed that his/her managed care company has contracts with or will negotiate with  certain facilities, including the following:     Patient/family informed of bed offers received:  09/14/2012 Patient chooses bed at [facility] Physician recommends and patient chooses bed at    Patient to be transferred to [facility] on  09/15/2012 Patient to be transferred to facility by ambulance  The following physician request were entered in Epic:   Additional Comments: 09/15/12 - Facility submitted clinicals to [employer] on 7/10  and  authorization rec'd morning of 7/11. 09/15/12 - Son contacted and advised that ambulance called.

## 2012-09-15 NOTE — Progress Notes (Signed)
Patient ID: Nicole [name], female   DOB: [birth year], 79 y.o.   MRN: [id number]         [facility]    Date of Admission:  09/05/2012   Total days of antibiotics 9         Principal Problem:   Osteomyelitis of lumbar spine Active Problems:   Discitis of lumbar region   Psoas abscess   Hypertension   ESRD (end stage renal disease) on dialysis   PVD (peripheral vascular disease)   Hypothyroidism   Anemia in chronic kidney disease(285.21)   Unspecified protein-calorie malnutrition   . calcium acetate  1,334 mg Oral TID WC  . ceFEPime (MAXIPIME) IV  2 g Intravenous Q T,Th,Sat-1800  . docusate sodium  100 mg Oral BID  . feeding supplement (NEPRO CARB STEADY)  237 mL Oral BID BM  . heparin  5,000 Units Subcutaneous Q8H  . levothyroxine  150 mcg Oral QAC breakfast  . lisinopril  20 mg Oral QHS  . multivitamin  1 tablet Oral QHS  . polyethylene glycol  17 g Oral BID  . sodium chloride  3 mL Intravenous Q12H  . vancomycin  750 mg Intravenous Q T,Th,Sa-HD    Subjective: Still having the back and left hip pain.  Past Medical History  Diagnosis Date  . Hypertension   . ESRD (end stage renal disease)   . Peripheral vascular disease     revascularization appx. 14 years ago  . Hypothyroidism   . Hiatal hernia   . Iron deficiency anemia   . Osteoarthritis   . Depression with anxiety   . Pulmonary embolism   . AAA (abdominal aortic aneurysm) 1999    aortic byfem bypass    History  Substance Use Topics  . Smoking status: Former Smoker -- 0.30 packs/day for 45 years    Quit date: 01/07/2012  . Smokeless tobacco: Not on file  . Alcohol Use: No    History reviewed. No pertinent family history.  No Known Allergies  Objective: Temp:  [97.3 F (36.3 C)-98.6 F (37 C)] 98 F (36.7 C) (07/11 0956) Pulse Rate:  [62-88] 69 (07/11 0956) Resp:  [14-18] 18 (07/11 0956) BP: (90-177)/(30-76) 144/62 mmHg (07/11 0956) SpO2:  [96 %-100 %] 100 % (07/11  0956)  General: Weak and uncomfortable  Lab Results Lab Results  Component Value Date   WBC 5.1 09/14/2012   HGB 9.8* 09/14/2012   HCT 28.6* 09/14/2012   MCV 97.6 09/14/2012   PLT 271 09/14/2012    Lab Results  Component Value Date   CREATININE 6.67* 09/14/2012   BUN 22 09/14/2012   NA 134* 09/14/2012   K 4.4 09/14/2012   CL 100 09/14/2012   CO2 19 09/14/2012    Lab Results  Component Value Date   ALT 5 03/20/2012   AST 12 03/20/2012   ALKPHOS 72 03/20/2012   BILITOT 0.3 03/20/2012      Microbiology: Recent Results (from the past 240 hour(s))  CULTURE, BLOOD (ROUTINE X 2)     Status: None   Collection Time    09/05/12  1:00 PM      Result Value Range Status   Specimen Description BLOOD HAND LEFT   Final   Special Requests BOTTLES DRAWN AEROBIC AND ANAEROBIC 4CC   Final   Culture  Setup Time 09/05/2012 16:21   Final   Culture     Final   Value: STAPHYLOCOCCUS SPECIES (COAGULASE NEGATIVE)     Note:  THE SIGNIFICANCE OF ISOLATING THIS ORGANISM FROM A SINGLE SET OF BLOOD CULTURES WHEN MULTIPLE SETS ARE DRAWN IS UNCERTAIN. PLEASE NOTIFY THE MICROBIOLOGY DEPARTMENT WITHIN ONE WEEK IF SPECIATION AND SENSITIVITIES ARE REQUIRED.     Note: Gram Stain Report Called to,Read Back By and Verified With: ANDREW Delford Field @ [phone] 09/06/12 BY KRAWS   Report Status 09/07/2012 FINAL   Final  CULTURE, BLOOD (ROUTINE X 2)     Status: None   Collection Time    09/05/12  4:45 PM      Result Value Range Status   Specimen Description BLOOD HEMODIALYSIS LINE   Final   Special Requests BOTTLES DRAWN AEROBIC AND ANAEROBIC 10CC   Final   Culture  Setup Time 09/06/2012 00:16   Final   Culture NO GROWTH 5 DAYS   Final   Report Status 09/12/2012 FINAL   Final  MRSA PCR SCREENING     Status: None   Collection Time    09/05/12  9:48 PM      Result Value Range Status   MRSA by PCR NEGATIVE  NEGATIVE Final   Comment:            The GeneXpert MRSA Assay (FDA     approved for NASAL specimens     only), is one  component of a     comprehensive MRSA colonization     surveillance program. It is not     intended to diagnose MRSA     infection nor to guide or     monitor treatment for     MRSA infections.  GRAM STAIN     Status: None   Collection Time    09/06/12  2:32 PM      Result Value Range Status   Specimen Description ABSCESS BACK   Final   Special Requests DISC SPACE L 2 3   Final   Gram Stain     Final   Value: ABUNDANT WBC PRESENT,BOTH PMN AND MONONUCLEAR     NO ORGANISMS SEEN   Report Status 09/06/2012 FINAL   Final  CULTURE, ROUTINE-ABSCESS     Status: None   Collection Time    09/06/12  2:32 PM      Result Value Range Status   Specimen Description ABSCESS BACK   Final   Special Requests L 2 3 SPACE   Final   Gram Stain     Final   Value: ABUNDANT WBC PRESENT,BOTH PMN AND MONONUCLEAR     NO SQUAMOUS EPITHELIAL CELLS SEEN     NO ORGANISMS SEEN     Performed at [facility]   Culture NO GROWTH 3 DAYS   Final   Report Status 09/10/2012 FINAL   Final  CULTURE, ROUTINE-ABSCESS     Status: None   Collection Time    09/11/12 12:55 PM      Result Value Range Status   Specimen Description ABSCESS OTHER   Final   Special Requests RIGHT PSOAS ABSCESS   Final   Gram Stain     Final   Value: MODERATE WBC PRESENT,BOTH PMN AND MONONUCLEAR     NO SQUAMOUS EPITHELIAL CELLS SEEN     NO ORGANISMS SEEN   Culture NO GROWTH 3 DAYS   Final   Report Status 09/15/2012 FINAL   Final  ANAEROBIC CULTURE     Status: None   Collection Time    09/11/12 12:56 PM      Result Value Range Status   Specimen Description  ABSCESS OTHER   Final   Special Requests RIGHT PSOAS ABSCESS   Final   Gram Stain     Final   Value: MODERATE WBC PRESENT,BOTH PMN AND MONONUCLEAR     NO SQUAMOUS EPITHELIAL CELLS SEEN     NO ORGANISMS SEEN   Culture     Final   Value: NO ANAEROBES ISOLATED; CULTURE IN PROGRESS FOR 5 DAYS   Report Status PENDING   Incomplete   Assessment: All cultures are final and  negative.  Plan: 1. Continue vancomycin and cefepime through August 13 2. I will arrange followup in our clinic 3. Please call Dr. Enedina Finner [phone]) for any infectious questions this weekend  Cliffton Asters, MD [facility] [phone] pager   [phone] cell 09/15/2012, 12:16 PM

## 2012-09-18 NOTE — Discharge Summary (Signed)
  Date: 09/18/2012  Patient name: Nicole [name]  Medical record number: [id number]  Date of birth: 1932/06/04   This patient has been seen and the plan of care was discussed with the house staff. Please see their note for complete details. I concur with their findings and plan. Jonah Blue, DO 09/18/2012, 7:45 AM

## 2012-09-20 ENCOUNTER — Non-Acute Institutional Stay (SKILLED_NURSING_FACILITY): Payer: No Typology Code available for payment source | Admitting: Internal Medicine

## 2012-09-20 DIAGNOSIS — M4626 Osteomyelitis of vertebra, lumbar region: Secondary | ICD-10-CM

## 2012-09-20 DIAGNOSIS — M869 Osteomyelitis, unspecified: Secondary | ICD-10-CM

## 2012-09-20 DIAGNOSIS — Z992 Dependence on renal dialysis: Secondary | ICD-10-CM

## 2012-09-20 DIAGNOSIS — N186 End stage renal disease: Secondary | ICD-10-CM

## 2012-09-20 DIAGNOSIS — I150 Renovascular hypertension: Secondary | ICD-10-CM

## 2012-09-20 DIAGNOSIS — E039 Hypothyroidism, unspecified: Secondary | ICD-10-CM

## 2012-09-25 IMAGING — CR DG CHEST 2V
2 series · 2 of 2 positions shown · non-contrast
Comparison: 11/15/2009

CLINICAL DATA: Preop fistula placement.

CHEST - 2 VIEW

[view not recorded (1 of 2)]
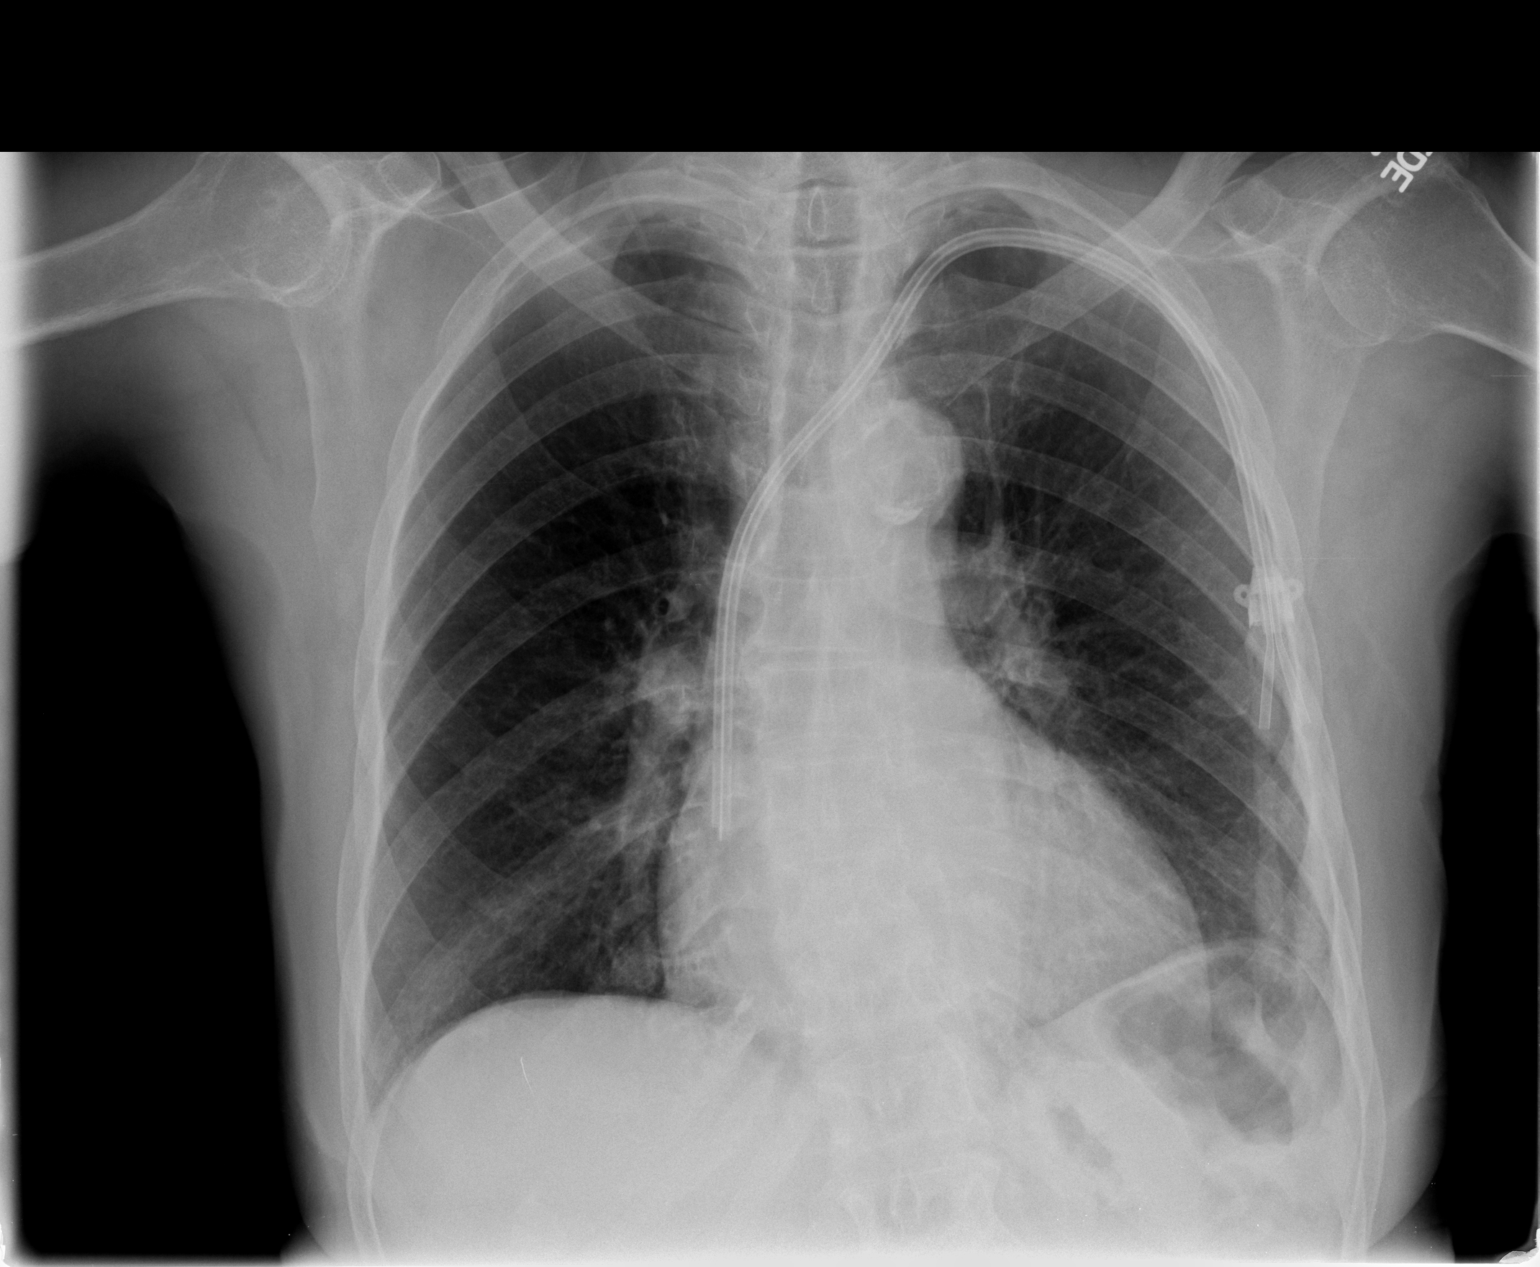

[view not recorded (2 of 2)]
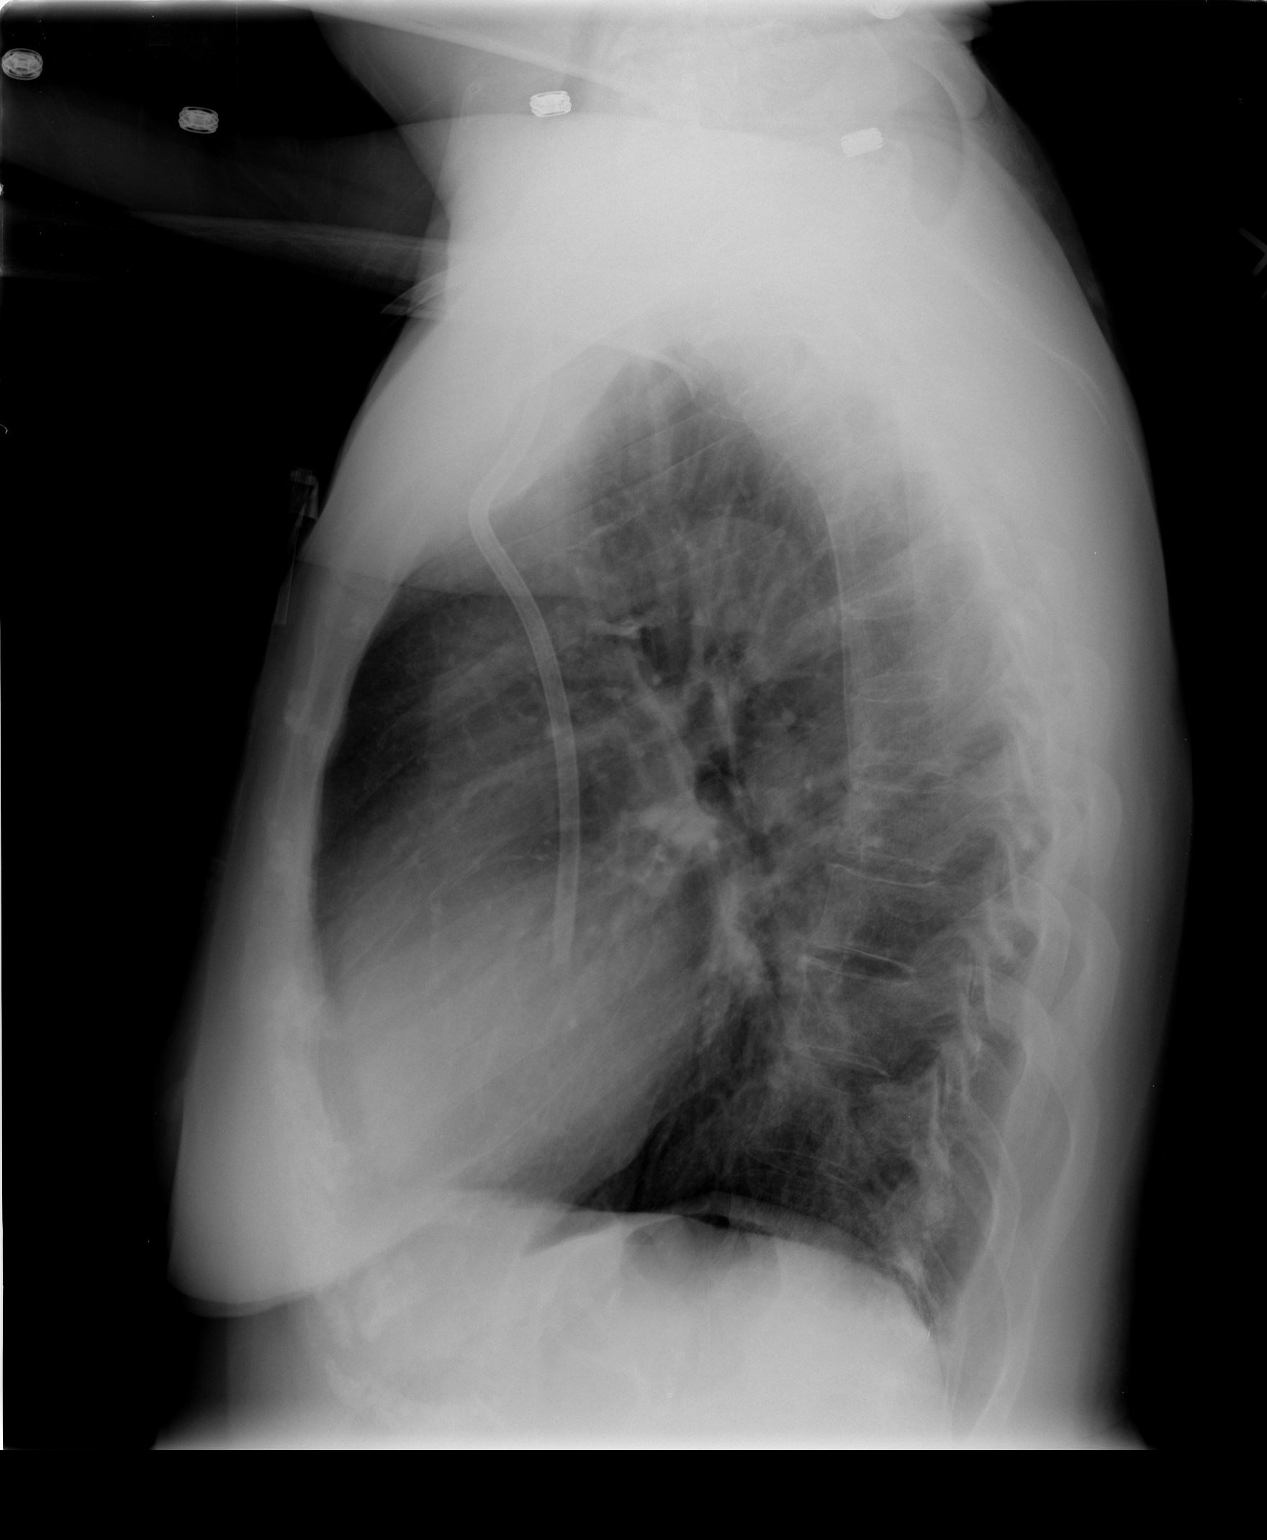

[2 of 2 positions shown; findings below may reference images not displayed]

FINDINGS: Heart and mediastinal contours are within normal limits.
No focal opacities or effusions.  No acute bony abnormality.  Left
dialysis catheter remains in place, unchanged.
IMPRESSION: No active cardiopulmonary disease.

## 2012-09-28 ENCOUNTER — Non-Acute Institutional Stay (SKILLED_NURSING_FACILITY): Payer: No Typology Code available for payment source | Admitting: Internal Medicine

## 2012-09-28 DIAGNOSIS — D631 Anemia in chronic kidney disease: Secondary | ICD-10-CM

## 2012-09-28 DIAGNOSIS — D7589 Other specified diseases of blood and blood-forming organs: Secondary | ICD-10-CM

## 2012-09-28 DIAGNOSIS — N039 Chronic nephritic syndrome with unspecified morphologic changes: Secondary | ICD-10-CM

## 2012-09-28 DIAGNOSIS — D473 Essential (hemorrhagic) thrombocythemia: Secondary | ICD-10-CM

## 2012-10-09 ENCOUNTER — Inpatient Hospital Stay: Payer: No Typology Code available for payment source | Admitting: Internal Medicine

## 2012-10-09 ENCOUNTER — Telehealth: Payer: Self-pay | Admitting: *Deleted

## 2012-10-09 NOTE — Telephone Encounter (Signed)
RN spoke with staff at [facility].  They did not know about the appointment today.  Made new appt for 10/23/12 @ 4:00 PM.  The patient goes to dialysis on Tuesdays and Thursdays.  [facility] staff verbalized back the appt information.

## 2012-10-11 ENCOUNTER — Non-Acute Institutional Stay (SKILLED_NURSING_FACILITY): Payer: No Typology Code available for payment source | Admitting: Internal Medicine

## 2012-10-11 DIAGNOSIS — K5900 Constipation, unspecified: Secondary | ICD-10-CM

## 2012-10-18 DIAGNOSIS — I150 Renovascular hypertension: Secondary | ICD-10-CM | POA: Insufficient documentation

## 2012-10-18 NOTE — Progress Notes (Signed)
Patient ID: Nicole [name], female   DOB: 1932/05/02, 79 y.o.   MRN: [id number]        HISTORY & PHYSICAL  DATE: 09/20/2012   FACILITY: [facility]  LEVEL OF CARE: SNF (31)  ALLERGIES:  No Known Allergies  CHIEF COMPLAINT:  Manage lumbar spine osteomyelitis, hypothyroidism, and accelerated hypertension.    HISTORY OF PRESENT ILLNESS:  The patient is a 79 year-old, African-American female.    LUMBAR SPINE OSTEOMYELITIS:  Patient was hospitalized with a diagnosis of recurrent lumbar diskitis and osteomyelitis and interval bilateral psoas abscess without epidural abscess.  One out of two blood cultures grew coagulase-negative Staph which was thought to be a contaminant.  CT aspiration of the back did not show any growth.  Empiric vancomycin and cefepime were started.  Back pain was treated with IV Dilaudid.  CT aspiration and culture of right psoas abscess were done.  Again, the culture did not show growth.  ID recommended IV vancomycin and cefepime until October 18, 2012.  Therefore, she is admitted for IV antibiotics.  Patient denies current back pain.    HYPOTHYROIDISM: The hypothyroidism was unstable due to poor compliance. No complications noted from the medications presently being used.  The patient denies fatigue or constipation.  Last TSH:  114.7.   Her Synthroid was restarted.  HTN: In the hospital, she had accelerated hypertension with systolic blood pressure in the 230s.  She was started on clonidine and hydralazine and fluid was removed at hemodialysis.  Pt 's HTN remains stable.  Denies CP, sob, DOE, pedal edema, headaches, dizziness or visual disturbances.  No complications from the medications currently being used.  Last BP : 140/70, 126/56    PAST MEDICAL HISTORY :  Past Medical History  Diagnosis Date  . Hypertension   . ESRD (end stage renal disease)   . Peripheral vascular disease     revascularization appx. 14 years ago  . Hypothyroidism   . Hiatal hernia    . Iron deficiency anemia   . Osteoarthritis   . Depression with anxiety   . Pulmonary embolism   . AAA (abdominal aortic aneurysm) 1999    aortic byfem bypass    PAST SURGICAL HISTORY: Past Surgical History  Procedure Laterality Date  . Abdominal surgery  about 14 years ago    REVASCULARIZATION  . Ateriovenous graft  04/18/2009    LEFT FOREARM avg  . Av fistula placement, radiocephalic  01/09/10  . Avgg removal  12/02/09    SECONDARY TO INFECTION LT FOREARM  . Av fistula placement, radiocephalic  05/08/10    NONMATURED  . Arteriovenous graft placement  07/24/10    RIGHT FOREARM LOOP  . Dg av dialysis graft declot or  12/05/10, 6/17/123, 09/24/11, 01/26/12    CLOTTED GRAFT  . Angioplasty  12/21/11    of RUE AVG    SOCIAL HISTORY:  reports that she quit smoking about 9 months ago. She does not have any smokeless tobacco history on file. She reports that she does not drink alcohol or use illicit drugs.  FAMILY HISTORY: None  CURRENT MEDICATIONS: Reviewed per MAR  REVIEW OF SYSTEMS:  See HPI otherwise 14 point ROS is negative.  PHYSICAL EXAMINATION  VS:  T 97.7       P 84      RR 18      BP 140/70      POX%        WT (Lb)  GENERAL: no acute  distress, normal body habitus EYES: conjunctivae normal, sclerae normal, normal eye lids MOUTH/THROAT: lips without lesions,no lesions in the mouth,tongue is without lesions,uvula elevates in midline NECK: supple, trachea midline, no neck masses, no thyroid tenderness, no thyromegaly LYMPHATICS: no LAN in the neck, no supraclavicular LAN RESPIRATORY: breathing is even & unlabored, BS CTAB CARDIAC: RRR, no murmur,no extra heart sounds, no edema GI:  ABDOMEN: abdomen soft, normal BS, no masses, no tenderness  LIVER/SPLEEN: no hepatomegaly, no splenomegaly MUSCULOSKELETAL: HEAD: normal to inspection & palpation BACK: no kyphosis, scoliosis or spinal processes tenderness EXTREMITIES: LEFT UPPER EXTREMITY: full range of motion, normal  strength & tone RIGHT UPPER EXTREMITY:  full range of motion, normal strength & tone LEFT LOWER EXTREMITY:  full range of motion, normal strength & tone RIGHT LOWER EXTREMITY:  full range of motion, normal strength & tone PSYCHIATRIC: the patient is alert & oriented to person, affect & behavior appropriate  LABS/RADIOLOGY: Lumbar spine x-ray:  No acute findings.  Lumbar spine MRI showed recurrent diskitis and osteomyelitis and interval bilateral psoas abscess, severe spinal stenosis at L2-3, related to spondylosis and transient hypertrophy.  No abscess.  BUN 58, creatinine 11.77, chloride 91, otherwise BMP normal.   MCV 102.2, otherwise CBC normal.    ASSESSMENT/PLAN:  Recurrent diskitis and lumbar osteomyelitis.  Continue IV antibiotics as recommended by ID.  Hypothyroidism.  Uncontrolled.  Synthroid restarted.  Recheck in six weeks.    Renovascular hypertension.  Blood pressure borderline.  We will monitor.    End-stage renal disease.  Continue hemodialysis.    Macrocytosis.  Check RBC folate and vitamin B12 level.    Check CBC and BMP.   I have reviewed patient's medical records received at admission/from hospitalization.  CPT CODE: 16109

## 2012-10-23 ENCOUNTER — Telehealth: Payer: Self-pay | Admitting: *Deleted

## 2012-10-23 ENCOUNTER — Non-Acute Institutional Stay (SKILLED_NURSING_FACILITY): Payer: No Typology Code available for payment source | Admitting: Internal Medicine

## 2012-10-23 ENCOUNTER — Inpatient Hospital Stay: Payer: No Typology Code available for payment source | Admitting: Internal Medicine

## 2012-10-23 DIAGNOSIS — E039 Hypothyroidism, unspecified: Secondary | ICD-10-CM

## 2012-10-23 DIAGNOSIS — D631 Anemia in chronic kidney disease: Secondary | ICD-10-CM

## 2012-10-23 DIAGNOSIS — K5900 Constipation, unspecified: Secondary | ICD-10-CM

## 2012-10-23 DIAGNOSIS — I150 Renovascular hypertension: Secondary | ICD-10-CM

## 2012-10-23 NOTE — Telephone Encounter (Signed)
SNF stated that their transportation person had tried to call [facility] about pt not coming for appt today.  Made an appt for Wed., Aug. 27, 2014 @ 3:30 pm.  Pt to arrive at 3:15 per the SNF.  Pt has dialysis on T/Th/Sat.

## 2012-10-28 DIAGNOSIS — K5900 Constipation, unspecified: Secondary | ICD-10-CM | POA: Insufficient documentation

## 2012-10-28 NOTE — Progress Notes (Signed)
PROGRESS NOTE  DATE: 10/23/2012  FACILITY: Nursing Home Location: [facility]  LEVEL OF CARE: SNF (31)  Routine Visit  CHIEF COMPLAINT:  Manage hypothyroidism, HTN & constipation  HISTORY OF PRESENT ILLNESS:  REASSESSMENT OF ONGOING PROBLEM(S):  HYPOTHYROIDISM: The hypothyroidism remains stable. No complications noted from the medications presently being used.  The patient denies fatigue or constipation.  Last TSH not available.  HTN: Pt 's HTN remains stable.  Denies CP, sob, DOE, pedal edema, headaches, dizziness or visual disturbances.  No complications from the medications currently being used.  Last BP :140/70  CONSTIPATION: The constipation remains stable. No complications from the medications presently being used. Patient denies ongoing constipation, abdominal pain, nausea or vomiting.  PAST MEDICAL HISTORY : Reviewed.  No changes.  CURRENT MEDICATIONS: Reviewed per [facility]  REVIEW OF SYSTEMS:  GENERAL: no change in appetite, no fatigue, no weight changes, no fever, chills or weakness RESPIRATORY: no cough, SOB, DOE, wheezing, hemoptysis CARDIAC: no chest pain, edema or palpitations GI: no abdominal pain, diarrhea, constipation, heart burn, nausea or vomiting  PHYSICAL EXAMINATION  VS:  T 99.7      P 70      RR 20     BP     POX %     WT (Lb) 115  GENERAL: no acute distress, normal body habitus EYES: conjunctivae normal, sclerae normal, normal eye lids NECK: supple, trachea midline, no neck masses, no thyroid tenderness, no thyromegaly LYMPHATICS: no LAN in the neck, no supraclavicular LAN RESPIRATORY: breathing is even & unlabored, BS CTAB CARDIAC: RRR, no murmur,no extra heart sounds, no edema GI: abdomen soft, normal BS, no masses, no tenderness, no hepatomegaly, no splenomegaly PSYCHIATRIC: the patient is alert & oriented to person, affect & behavior appropriate  LABS/RADIOLOGY:  7/14 wbc 6.3, Hb 10.9, mcv 101, plt 406, cr 6.16, Ca 10.8 ow  bmp nl, vitamin B12 level 1259, folate 16.6  ASSESSMENT/PLAN:  Hypothyroidism-check TSH Renovascular HTN-BP borderline.  Will monitor. Constipation -well controlled. Anemia of chronic kidney dz-stable ESRD-on HD Thrombocytosis-acute phase reactant.  Will monitor. LS spine OM-on vancomycin & cefepime at HD Check liver profile.  CPT CODE: 16109

## 2012-10-30 ENCOUNTER — Non-Acute Institutional Stay (SKILLED_NURSING_FACILITY): Payer: No Typology Code available for payment source | Admitting: Internal Medicine

## 2012-10-30 DIAGNOSIS — D473 Essential (hemorrhagic) thrombocythemia: Secondary | ICD-10-CM | POA: Insufficient documentation

## 2012-10-30 DIAGNOSIS — R630 Anorexia: Secondary | ICD-10-CM

## 2012-10-30 DIAGNOSIS — F329 Major depressive disorder, single episode, unspecified: Secondary | ICD-10-CM

## 2012-10-30 DIAGNOSIS — D7589 Other specified diseases of blood and blood-forming organs: Secondary | ICD-10-CM | POA: Insufficient documentation

## 2012-10-30 DIAGNOSIS — E039 Hypothyroidism, unspecified: Secondary | ICD-10-CM

## 2012-10-30 NOTE — Progress Notes (Signed)
Patient ID: Nicole [name], female   DOB: 1932-08-13, 79 y.o.   MRN: [id number]        PROGRESS NOTE  DATE: 09/28/2012  FACILITY:  [facility]  LEVEL OF CARE: SNF (31)  Acute Visit  CHIEF COMPLAINT:  Manage anemia of chronic kidney disease and hypercalcemia.    HISTORY OF PRESENT ILLNESS: I was requested by the staff to assess the patient regarding above problem(s):  ANEMIA: The anemia is unstable. The patient denies fatigue, melena or hematochezia. The patient is not on anemia medications.   On 09/22/2012:  Hemoglobin 10.9, MCV 101.  On 09/05/2012:  Hemoglobin 12.4, MCV 102.2.    HYPERCALCEMIA:  New problem.  On 09/22/2012:  Calcium 10.3.  On 09/05/2012:  Calcium 8.9.  She is  currently on Phoslo.  She denies any acute symptoms.    PAST MEDICAL HISTORY : Reviewed.  No changes.  CURRENT MEDICATIONS: Reviewed per [facility]  REVIEW OF SYSTEMS:  GENERAL: no change in appetite, no fatigue, no weight changes, no fever, chills or weakness RESPIRATORY: no cough, SOB, DOE,, wheezing, hemoptysis CARDIAC: no chest pain, edema or palpitations GI: no abdominal pain, diarrhea, constipation, heart burn, nausea or vomiting  PHYSICAL EXAMINATION  GENERAL: no acute distress, normal body habitus NECK: supple, trachea midline, no neck masses, no thyroid tenderness, no thyromegaly RESPIRATORY: breathing is even & unlabored, BS CTAB CARDIAC: RRR, no murmur,no extra heart sounds, no edema GI: abdomen soft, normal BS, no masses, no tenderness, no hepatomegaly, no splenomegaly PSYCHIATRIC: the patient is alert & oriented to person, affect & behavior appropriate  LABS/RADIOLOGY: 09/22/2012:  Platelet count 406.    RBC folate 1353, vitamin B12 level 1259.    09/05/2012:  Platelet count 252.    ASSESSMENT/PLAN:  Anemia of chronic kidney disease.  Unstable.  Hemoglobin declined.  We will monitor.    Hypercalcemia.  New problem.  Patient asymptomatic.  We will monitor.     Thrombocytosis.  New problem.  Likely acute phase reactant.  We will monitor.    Macrocytosis.  RBC folate and vitamin B12 levels are normal.    CPT CODE: 40981

## 2012-11-01 ENCOUNTER — Encounter: Payer: Self-pay | Admitting: Internal Medicine

## 2012-11-01 ENCOUNTER — Ambulatory Visit (INDEPENDENT_AMBULATORY_CARE_PROVIDER_SITE_OTHER): Payer: No Typology Code available for payment source | Admitting: Internal Medicine

## 2012-11-01 VITALS — BP 216/73 | HR 68 | Temp 98.1°F | Ht 62.0 in | Wt 112.0 lb

## 2012-11-01 DIAGNOSIS — M869 Osteomyelitis, unspecified: Secondary | ICD-10-CM

## 2012-11-01 DIAGNOSIS — M4626 Osteomyelitis of vertebra, lumbar region: Secondary | ICD-10-CM

## 2012-11-01 NOTE — Progress Notes (Addendum)
Patient ID: Nicole [name], female   DOB: [birth year], 79 y.o.   MRN: [id number]         [facility] for Infectious Disease  Patient Active Problem List   Diagnosis Date Noted  . Discitis of lumbar region 09/05/2012    Priority: High  . Osteomyelitis of lumbar spine 09/05/2012    Priority: High  . Psoas abscess 09/05/2012    Priority: High  . Hypercalcemia 10/30/2012  . Essential thrombocythemia 10/30/2012  . Macrocytosis 10/30/2012  . Unspecified constipation 10/28/2012  . Secondary renovascular hypertension, benign 10/18/2012  . Unspecified protein-calorie malnutrition 09/10/2012  . Acute back pain 03/21/2012  . HTN (hypertension), malignant 03/21/2012  . ? Osteomyelitis vs diskitis spine 03/21/2012  . Abnormal involuntary movements 03/21/2012  . Pulmonary edema 06/15/2011  . Anemia in chronic kidney disease(285.21) 06/15/2011  . Hypothyroidism 06/12/2011  . Hypertension 06/11/2011  . ESRD (end stage renal disease) on dialysis 06/11/2011  . PVD (peripheral vascular disease) 06/11/2011    Patient's Medications  New Prescriptions   No medications on file  Previous Medications   AMLODIPINE (NORVASC) 10 MG TABLET    Take 10 mg by mouth at bedtime.   CALCIUM ACETATE (PHOSLO) 667 MG CAPSULE    Take 2 capsules (1,334 mg total) by mouth 3 (three) times daily with meals.   DOCUSATE SODIUM 100 MG CAPS    Take 100 mg by mouth 2 (two) times daily.   FEEDING SUPPLEMENT (RESOURCE BREEZE) LIQD    Take 1 Container by mouth 3 (three) times daily between meals.   HYDROCODONE-ACETAMINOPHEN (NORCO) 10-325 MG PER TABLET    Take 1 tablet by mouth every 3 (three) hours as needed.   LEVOTHYROXINE (SYNTHROID, LEVOTHROID) 150 MCG TABLET    Take 1 tablet (150 mcg total) by mouth daily before breakfast.   LIDOCAINE-PRILOCAINE (EMLA) CREAM    Apply topically as needed (1 hours prior to dialysis).   LISINOPRIL (PRINIVIL,ZESTRIL) 40 MG TABLET    Take 40 mg by mouth at bedtime.   MULTIVITAMIN  (RENA-VIT) TABS TABLET    Take 1 tablet by mouth at bedtime.  Modified Medications   No medications on file  Discontinued Medications   DEXTROSE 5 % SOLN 50 ML WITH CEFEPIME 2 G SOLR 2 G    Inject 2 g into the vein every Tuesday, Thursday, and Saturday at 6 PM.   IBUPROFEN (ADVIL,MOTRIN) 400 MG TABLET    Take 1 tablet (400 mg total) by mouth every 6 (six) hours as needed for pain.   MELOXICAM (MOBIC) 15 MG TABLET    Take 1 tablet (15 mg total) by mouth daily.   VANCOMYCIN (VANCOCIN) 750 MG/150ML SOLN    Inject 150 mLs (750 mg total) into the vein Every Tuesday,Thursday,and Saturday with dialysis.    Subjective: Nicole [name] is in for her hospital followup visit. She was hospitalized in early Nicole with recurrent lumbar infection. She had been treated for culture-negative lumbar infection in January of this year and improved. She recently started having severe low back pain again and was found to have recurrent infection at the L2-3 level. Gram stain and culture of the needle aspirate specimen were both negative. She was treated again with empiric vancomycin and cefepime. She was discharged to [facility] nursing home. She believes that her antibiotics were stopped about 2 weeks ago which would have given her the correct length of 6 weeks of total therapy. She is feeling much better. She now has just minimal pain in her  right lower back and hip. She rates her pain currently a 1/10. She says that she was released by physical therapy last week she has no difficulty ambulating on her own.  Review of Systems: Pertinent items are noted in HPI.  Past Medical History  Diagnosis Date  . Hypertension   . ESRD (end stage renal disease)   . Peripheral vascular disease     revascularization appx. 14 years ago  . Hypothyroidism   . Hiatal hernia   . Iron deficiency anemia   . Osteoarthritis   . Depression with anxiety   . Pulmonary embolism   . AAA (abdominal aortic aneurysm) 1999    aortic byfem bypass      History  Substance Use Topics  . Smoking status: Former Smoker -- 0.30 packs/day for 45 years    Quit date: 01/07/2012  . Smokeless tobacco: Not on file  . Alcohol Use: No    No family history on file.  No Known Allergies  Objective: Temp: 98.1 F (36.7 C) (08/27 1421) Temp src: Oral (08/27 1421) BP: 216/73 mmHg (08/27 1421) Pulse Rate: 68 (08/27 1421)  General: She looks much better and is in very good spirits Skin: Right forearm fistula has a good thrill Lungs: Clear Cor: Regular S1 and S2 with an unchanged harsh 2/6 systolic murmur Nontender over lower spine  Assessment: She is doing much better and is now completed 6 weeks of empiric IV antibiotics for recurrent lumbar discitis.  Plan: 1. Continue observation off of antibiotics 2. Followup here as needed   Cliffton Asters, MD [facility] for Infectious Disease [facility] [phone] pager   [phone] cell 11/01/2012, 2:37 PM

## 2012-11-01 NOTE — Progress Notes (Signed)
Patient ID: Nicole [name], female   DOB: Sep 19, 1932, 79 y.o.   MRN: [id number]        PROGRESS NOTE  DATE: 10/11/2012  FACILITY:  [facility]  LEVEL OF CARE: SNF (31)  Acute Visit  CHIEF COMPLAINT:  Manage constipation.    HISTORY OF PRESENT ILLNESS: I was requested by the staff to assess the patient regarding above problem(s):  Staff report that patient is not having bowel movements.  Patient does admit to constipation, but she denies abdominal pain, nausea or vomiting.   Staff cannot identify precipitating or alleviating factors.  There is no temporal relationship.    PAST MEDICAL HISTORY : Reviewed.  No changes.  CURRENT MEDICATIONS: Reviewed per [facility]  REVIEW OF SYSTEMS:  GENERAL: no change in appetite, no fatigue, no weight changes, no fever, chills or weakness RESPIRATORY: no cough, SOB, DOE,, wheezing, hemoptysis CARDIAC: no chest pain, edema or palpitations GI: no abdominal pain, diarrhea, heart burn, nausea or vomiting; complains of constipation    PHYSICAL EXAMINATION  GENERAL: no acute distress, normal body habitus NECK: supple, trachea midline, no neck masses, no thyroid tenderness, no thyromegaly RESPIRATORY: breathing is even & unlabored, BS CTAB CARDIAC: RRR, no murmur,no extra heart sounds, no edema GI: abdomen soft, normal BS, no masses, no tenderness, no hepatomegaly, no splenomegaly PSYCHIATRIC: the patient is alert & oriented to person, affect & behavior appropriate  ASSESSMENT/PLAN:  Constipation.  Uncontrolled problem.  Add MiraLAX 17 g q.d.    CPT CODE: 40981

## 2012-11-10 ENCOUNTER — Non-Acute Institutional Stay (SKILLED_NURSING_FACILITY): Payer: No Typology Code available for payment source | Admitting: Internal Medicine

## 2012-11-10 DIAGNOSIS — D631 Anemia in chronic kidney disease: Secondary | ICD-10-CM

## 2012-11-10 DIAGNOSIS — I150 Renovascular hypertension: Secondary | ICD-10-CM

## 2012-11-10 DIAGNOSIS — K5900 Constipation, unspecified: Secondary | ICD-10-CM

## 2012-11-10 DIAGNOSIS — E039 Hypothyroidism, unspecified: Secondary | ICD-10-CM

## 2012-11-11 NOTE — Progress Notes (Signed)
PROGRESS NOTE  DATE: 11-10-12  FACILITY: Nursing Home Location: [facility]  LEVEL OF CARE: SNF (31)  Routine Visit  CHIEF COMPLAINT:  Manage hypothyroidism, HTN & constipation  HISTORY OF PRESENT ILLNESS:  REASSESSMENT OF ONGOING PROBLEM(S):  HYPOTHYROIDISM: The hypothyroidism remains stable. No complications noted from the medications presently being used.  The patient denies fatigue or constipation.  Last TSH 0.051 on 11-08-12.  HTN: Pt 's HTN remains stable.  Denies CP, sob, DOE, pedal edema, headaches, dizziness or visual disturbances.  No complications from the medications currently being used.  Last BP :140/70, 132/70  CONSTIPATION: The constipation remains stable. No complications from the medications presently being used. Patient denies ongoing constipation, abdominal pain, nausea or vomiting.  PAST MEDICAL HISTORY : Reviewed.  No changes.  CURRENT MEDICATIONS: Reviewed per [facility]  REVIEW OF SYSTEMS:  GENERAL: no change in appetite, no fatigue, no weight changes, no fever, chills or weakness RESPIRATORY: no cough, SOB, DOE, wheezing, hemoptysis CARDIAC: no chest pain, edema or palpitations GI: no abdominal pain, diarrhea, constipation, heart burn, nausea or vomiting  PHYSICAL EXAMINATION  VS:  T 98.1      P 72      RR 20     BP     POX %     WT (Lb) 117  GENERAL: no acute distress, normal body habitus EYES: conjunctivae normal, sclerae normal, normal eye lids NECK: supple, trachea midline, no neck masses, no thyroid tenderness, no thyromegaly LYMPHATICS: no LAN in the neck, no supraclavicular LAN RESPIRATORY: breathing is even & unlabored, BS CTAB CARDIAC: RRR, no murmur,no extra heart sounds, no edema GI: abdomen soft, normal BS, no masses, no tenderness, no hepatomegaly, no splenomegaly PSYCHIATRIC: the patient is alert & oriented to person, affect & behavior appropriate  LABS/RADIOLOGY:  8/14 AP 130 ow liver profile nl  7/14 wbc 6.3, Hb  10.9, mcv 101, plt 406, cr 6.16, Ca 10.8 ow bmp nl, vitamin B12 level 1259, folate 16.6  ASSESSMENT/PLAN:  Hypothyroidism-unstable problem.  Synthroid decreased.  Recheck TSH is pending. Renovascular HTN-well controlled. Constipation -well controlled. Anemia of chronic kidney dz-stable ESRD-on HD Thrombocytosis-acute phase reactant.  Will monitor. LS spine OM-on vancomycin & cefepime at HD  CPT CODE: 16109

## 2012-11-29 DIAGNOSIS — R630 Anorexia: Secondary | ICD-10-CM | POA: Insufficient documentation

## 2012-11-29 DIAGNOSIS — F329 Major depressive disorder, single episode, unspecified: Secondary | ICD-10-CM | POA: Insufficient documentation

## 2012-11-29 NOTE — Progress Notes (Signed)
Patient ID: Nicole [name], female   DOB: October 07, 1932, 79 y.o.   MRN: [id number]        PROGRESS NOTE  DATE: 10/30/2012  FACILITY:  [facility]  LEVEL OF CARE: SNF (31)  Acute Visit  CHIEF COMPLAINT:  Manage hypothyroidism, anorexia, and depression.    HISTORY OF PRESENT ILLNESS: I was requested by the staff to assess the patient regarding above problem(s):  HYPOTHYROIDISM: The hypothyroidism is unstable. No complications noted from the medications presently being used.  The patient feels fatigued and has loss of appetite.  The patient denies constipation.  Last TSH:  On 10/24/2012:  TSH 0.198.    ANOREXIA:   New problem.  The patient is complaining of not having any appetite.  She denies abdominal pain, nausea or vomiting.  She cannot identify precipitating or alleviating factors.  There is no temporal relationship.    DEPRESSION:  New problem.  The patient is complaining of feeling sad and down, but denies insomnia.    PAST MEDICAL HISTORY : Reviewed.  No changes.  CURRENT MEDICATIONS: Reviewed per [facility]  REVIEW OF SYSTEMS:  GENERAL: complains of fatigue and loss of appetite; no weight changes, no fever, chills or weakness RESPIRATORY: no cough, SOB, DOE,, wheezing, hemoptysis CARDIAC: no chest pain, edema or palpitations GI: no abdominal pain, diarrhea, constipation, heart burn, nausea or vomiting  PHYSICAL EXAMINATION  GENERAL: no acute distress, normal body habitus EYES: conjunctivae normal, sclerae normal, normal eye lids NECK: supple, trachea midline, no neck masses, no thyroid tenderness, no thyromegaly LYMPHATICS: no LAN in the neck, no supraclavicular LAN RESPIRATORY: breathing is even & unlabored, BS CTAB CARDIAC: RRR, no murmur,no extra heart sounds, no edema GI: abdomen soft, normal BS, no masses, no tenderness, no hepatomegaly, no splenomegaly PSYCHIATRIC: the patient is alert & oriented to person, affect & behavior  appropriate  LABS/RADIOLOGY: 10/24/2012:  Alkaline phosphatase 130, otherwise liver profile normal.    ASSESSMENT/PLAN:  Hypothyroidism.  Unstable problem.  Decrease levothyroxine to 125 mcg q.d.  Check TSH in six weeks.  Anorexia.   New problem.  Likely due to depression.  Start Remeron 15 mg q.h.s.  Continue dietary supplementation.    Depression.  New onset.  Significant problem.  Should be addressed by Remeron.    CPT CODE: 14782

## 2012-11-30 ENCOUNTER — Other Ambulatory Visit: Payer: Self-pay | Admitting: *Deleted

## 2012-11-30 MED ORDER — HYDROCODONE-ACETAMINOPHEN 10-325 MG PO TABS: 1.0000 | ORAL_TABLET | ORAL | Status: DC | PRN

## 2012-12-05 ENCOUNTER — Non-Acute Institutional Stay (SKILLED_NURSING_FACILITY): Payer: No Typology Code available for payment source | Admitting: Adult Health

## 2012-12-05 DIAGNOSIS — I150 Renovascular hypertension: Secondary | ICD-10-CM

## 2012-12-05 DIAGNOSIS — K5900 Constipation, unspecified: Secondary | ICD-10-CM

## 2012-12-05 DIAGNOSIS — E039 Hypothyroidism, unspecified: Secondary | ICD-10-CM

## 2012-12-05 DIAGNOSIS — R259 Unspecified abnormal involuntary movements: Secondary | ICD-10-CM

## 2012-12-11 NOTE — Assessment & Plan Note (Signed)
Will continue colace twice daily and miralax daily

## 2012-12-11 NOTE — Assessment & Plan Note (Signed)
Will check cbc with diff; cmp; ammonia; tsh; folate; b12; phos; mag; will continue to monitor her status; based upon her lab results will treat as indicated; will then refer to neurology as indicated.

## 2012-12-11 NOTE — Progress Notes (Signed)
Patient ID: Nicole [name], female   DOB: 17-Mar-1932, 79 y.o.   MRN: [id number]  [facility]  No Known Allergies   Chief Complaint  Patient presents with  . Acute Visit    abnormal movements    HPI: She is having abnormal movement of her right face both arms and right leg. She states she had this last night; it stopped and then restarted after dialysis today. Her reflexes are intact; her cranial nerves are intact. I have had Dr. Leanord Hawking come and see her as well. I have discussed this with her son who is present he tells me that she had this happen several months ago and the episode lasted 45 minutes resolved and has not happened since that time. We will start with lab work to look at any underlying issues and will address as indicated will then refer to neurology as indicated.   Past Medical History  Diagnosis Date  . Hypertension   . ESRD (end stage renal disease)   . Peripheral vascular disease     revascularization appx. 14 years ago  . Hypothyroidism   . Hiatal hernia   . Iron deficiency anemia   . Osteoarthritis   . Depression with anxiety   . Pulmonary embolism   . AAA (abdominal aortic aneurysm) 1999    aortic byfem bypass    Past Surgical History  Procedure Laterality Date  . Abdominal surgery  about 14 years ago    REVASCULARIZATION  . Ateriovenous graft  04/18/2009    LEFT FOREARM avg  . Av fistula placement, radiocephalic  01/09/10  . Avgg removal  12/02/09    SECONDARY TO INFECTION LT FOREARM  . Av fistula placement, radiocephalic  05/08/10    NONMATURED  . Arteriovenous graft placement  07/24/10    RIGHT FOREARM LOOP  . Dg av dialysis graft declot or  12/05/10, 6/17/123, 09/24/11, 01/26/12    CLOTTED GRAFT  . Angioplasty  12/21/11    of RUE AVG    VITAL SIGNS BP 138/75  Pulse 78   Patient's Medications  New Prescriptions   No medications on file  Previous Medications   AMLODIPINE (NORVASC) 10 MG TABLET    Take 10 mg by mouth at bedtime.   DOCUSATE SODIUM  100 MG CAPS    Take 100 mg by mouth 2 (two) times daily.   FEEDING SUPPLEMENT (RESOURCE BREEZE) LIQD    Take 1 Container by mouth 3 (three) times daily between meals.   HYDRALAZINE (APRESOLINE) 25 MG TABLET    Take 25 mg by mouth 2 (two) times daily.   LIDOCAINE-PRILOCAINE (EMLA) CREAM    Apply topically as needed (1 hours prior to dialysis).   LISINOPRIL (PRINIVIL,ZESTRIL) 40 MG TABLET    Take 40 mg by mouth at bedtime.   MIRTAZAPINE (REMERON) 15 MG TABLET    Take 15 mg by mouth at bedtime.   MULTIVITAMIN (RENA-VIT) TABS TABLET    Take 1 tablet by mouth at bedtime.   POLYETHYLENE GLYCOL (MIRALAX / GLYCOLAX) PACKET    Take 17 g by mouth daily.  Modified Medications   Modified Medication Previous Medication   HYDROCODONE-ACETAMINOPHEN (NORCO) 10-325 MG PER TABLET HYDROcodone-acetaminophen (NORCO) 10-325 MG per tablet      Take 1 tablet by mouth every 8 (eight) hours as needed.    Take 1 tablet by mouth every 3 (three) hours as needed.   LEVOTHYROXINE (SYNTHROID, LEVOTHROID) 150 MCG TABLET levothyroxine (SYNTHROID, LEVOTHROID) 150 MCG tablet      Take  125 mcg by mouth daily before breakfast.    Take 1 tablet (150 mcg total) by mouth daily before breakfast.  Discontinued Medications   No medications on file    SIGNIFICANT DIAGNOSTIC EXAMS    LABS REVIEWED:   09-05-12: tsh 114.710 09-14-12: wbc 5.1; hgb 9.8; hct 28.6; mcv 97.6 ;plt 271; glucose 77; bun 22; creat 6.67; k+4.4; na++134;  09-23-12: wbc 6.3; hgb 10.9; hct 33.1; mcv 101;plt 406; glucose 92; bun 20; creat 6.16; k+4.2; na++139; vit 12: 1259; folate 16.6 10-25-12: liver normal albumin 3.8; tsh 0.198 11-09-12: tsh 0.051    Review of Systems  Constitutional: Negative for malaise/fatigue.  Respiratory: Negative for cough and shortness of breath.   Cardiovascular: Negative for chest pain and palpitations.  Gastrointestinal: Negative for heartburn and abdominal pain.  Musculoskeletal: Negative for myalgias.  Skin: Negative.    Neurological: Negative for headaches.       Has abnormal movements of right side of face; both arms and legs  Psychiatric/Behavioral: The patient is nervous/anxious.      Physical Exam  Constitutional: She appears well-developed and well-nourished. No distress.  Neck: Neck supple. No JVD present. No thyromegaly present.  Cardiovascular: Normal rate, regular rhythm and intact distal pulses.   Respiratory: Effort normal and breath sounds normal. No respiratory distress. She has no wheezes.  GI: Soft. Bowel sounds are normal. She exhibits no distension. There is no tenderness.  Musculoskeletal: She exhibits no edema.  Is able to move all extremities. Has clonic movements to right side of face; both arms and right leg. Has NO resting tremor present bilaterally.   Neurological: She is alert. She displays normal reflexes. No cranial nerve deficit.  Skin: Skin is warm and dry. She is not diaphoretic.  Psychiatric: She has a normal mood and affect.     ASSESSMENT/ PLAN:  Hypothyroidism Her last tsh was 0.051 and her synthroid was adjusted to 125 mcg daily; will not make further adjustments at this time; will recheck a tsh and will monitor her status   Secondary renovascular hypertension, benign Will continue lisinopril 40 mg daily; norvasc 10 mg daily; hydralazine 25 mg twice daily  Unspecified constipation Will continue colace twice daily and miralax daily  Abnormal involuntary movements(781.0) Will check cbc with diff; cmp; ammonia; tsh; folate; b12; phos; mag; will continue to monitor her status; based upon her lab results will treat as indicated; will then refer to neurology as indicated.    Time spent with patient 50 minutes.

## 2012-12-11 NOTE — Assessment & Plan Note (Signed)
Her last tsh was 0.051 and her synthroid was adjusted to 125 mcg daily; will not make further adjustments at this time; will recheck a tsh and will monitor her status

## 2012-12-11 NOTE — Assessment & Plan Note (Signed)
Will continue lisinopril 40 mg daily; norvasc 10 mg daily; hydralazine 25 mg twice daily

## 2012-12-14 ENCOUNTER — Non-Acute Institutional Stay (SKILLED_NURSING_FACILITY): Payer: No Typology Code available for payment source | Admitting: Internal Medicine

## 2012-12-14 DIAGNOSIS — E039 Hypothyroidism, unspecified: Secondary | ICD-10-CM

## 2013-01-01 ENCOUNTER — Encounter: Payer: Self-pay | Admitting: Family

## 2013-01-01 ENCOUNTER — Non-Acute Institutional Stay (SKILLED_NURSING_FACILITY): Payer: No Typology Code available for payment source | Admitting: Family

## 2013-01-01 DIAGNOSIS — G8929 Other chronic pain: Secondary | ICD-10-CM

## 2013-01-01 DIAGNOSIS — R7989 Other specified abnormal findings of blood chemistry: Secondary | ICD-10-CM

## 2013-01-01 DIAGNOSIS — K5900 Constipation, unspecified: Secondary | ICD-10-CM

## 2013-01-01 DIAGNOSIS — R946 Abnormal results of thyroid function studies: Secondary | ICD-10-CM

## 2013-01-01 DIAGNOSIS — N186 End stage renal disease: Secondary | ICD-10-CM

## 2013-01-01 DIAGNOSIS — I10 Essential (primary) hypertension: Secondary | ICD-10-CM

## 2013-01-01 DIAGNOSIS — M549 Dorsalgia, unspecified: Secondary | ICD-10-CM

## 2013-01-01 NOTE — Progress Notes (Signed)
Patient ID: Nicole [name], female   DOB: 09-24-1932, 79 y.o.   MRN: [id number] Date: 01/01/13  Facility: [facility]  Code Status:  Full  Chief Complaint  Patient presents with  . Medical Managment of Chronic Issues    Routine Visit    HPI: Pt is being followed for the medical management of chronic illnesses. Pt and health care team denies further concerns / issues at present.      No Known Allergies   Medication List       This list is accurate as of: 01/01/13  2:31 PM.  Always use your most recent med list.               amLODipine 10 MG tablet  Commonly known as:  NORVASC  Take 10 mg by mouth at bedtime.     DSS 100 MG Caps  Take 100 mg by mouth 2 (two) times daily.     feeding supplement (RESOURCE BREEZE) Liqd  Take 1 Container by mouth 3 (three) times daily between meals.     hydrALAZINE 25 MG tablet  Commonly known as:  APRESOLINE  Take 25 mg by mouth 2 (two) times daily.     HYDROcodone-acetaminophen 10-325 MG per tablet  Commonly known as:  NORCO  Take 1 tablet by mouth every 8 (eight) hours as needed.     levothyroxine 150 MCG tablet  Commonly known as:  SYNTHROID, LEVOTHROID  Take 125 mcg by mouth daily before breakfast.     lidocaine-prilocaine cream  Commonly known as:  EMLA  Apply topically as needed (1 hours prior to dialysis).     lisinopril 40 MG tablet  Commonly known as:  PRINIVIL,ZESTRIL  Take 40 mg by mouth at bedtime.     mirtazapine 15 MG tablet  Commonly known as:  REMERON  Take 15 mg by mouth at bedtime.     multivitamin Tabs tablet  Take 1 tablet by mouth at bedtime.     polyethylene glycol packet  Commonly known as:  MIRALAX / GLYCOLAX  Take 17 g by mouth daily.         DATA REVIEWED    Laboratory Studies: 12/12/12-TSH 0.276 12/06/12-WBC 5.7. RBC 3.82, Hemoglobin 12.4, Hematocrit 38.7, Platelets 298, BUN 37, Creatinine 7.35, Chloride 93, Albumin 3.4  Past Medical History  Diagnosis Date  . Hypertension   .  ESRD (end stage renal disease)   . Peripheral vascular disease     revascularization appx. 14 years ago  . Hypothyroidism   . Hiatal hernia   . Iron deficiency anemia   . Osteoarthritis   . Depression with anxiety   . Pulmonary embolism   . AAA (abdominal aortic aneurysm) 1999    aortic byfem bypass    Past Surgical History  Procedure Laterality Date  . Abdominal surgery  about 14 years ago    REVASCULARIZATION  . Ateriovenous graft  04/18/2009    LEFT FOREARM avg  . Av fistula placement, radiocephalic  01/09/10  . Avgg removal  12/02/09    SECONDARY TO INFECTION LT FOREARM  . Av fistula placement, radiocephalic  05/08/10    NONMATURED  . Arteriovenous graft placement  07/24/10    RIGHT FOREARM LOOP  . Dg av dialysis graft declot or  12/05/10, 6/17/123, 09/24/11, 01/26/12    CLOTTED GRAFT  . Angioplasty  12/21/11    of RUE AVG      History   Social History  . Marital Status: Widowed    Spouse  Name: N/A    Number of Children: N/A  . Years of Education: N/A   Occupational History  . Not on file.   Social History Main Topics  . Smoking status: Former Smoker -- 0.30 packs/day for 45 years    Quit date: 01/07/2012  . Smokeless tobacco: Not on file  . Alcohol Use: No  . Drug Use: No  . Sexual Activity: No   Other Topics Concern  . Not on file   Social History Narrative  . No narrative on file     Review of Systems  Constitutional: Negative.   HENT: Negative.   Eyes:       Corrective lenses for reading  Respiratory: Negative.   Cardiovascular: Negative.   Gastrointestinal: Negative.   Genitourinary:       [facility], Dialysis T-R-S  Musculoskeletal: Positive for back pain.       Mobility with walker and wheelchair  Skin:       RUE graft  Neurological: Negative.   Psychiatric/Behavioral: Negative.        Support from community- [employer]     Physical Exam Filed Vitals:   01/01/13 1424  BP: 120/75  Pulse: 66  Temp: 98.1 F (36.7 C)   Resp: 18   There is no weight on file to calculate BMI. Physical Exam  Constitutional: She is oriented to person, place, and time. She is active. No distress.  In hospital gown; lying in supine position in facility's bed  HENT:  Head: Normocephalic.  Mouth/Throat: Oropharynx is clear and moist. She has dentures.  Eyes: Conjunctivae and lids are normal.  Neck: No JVD present. No mass and no thyromegaly present.  Cardiovascular: Normal rate and regular rhythm.   1+ bilat pedal pulses  Pulmonary/Chest: Effort normal and breath sounds normal.  Abdominal: Soft. Bowel sounds are normal. There is no hepatosplenomegaly. There is no tenderness. No hernia.  Incision scar to abdomen  Genitourinary:  RUA graft + bruits/ thrills  Lymphadenopathy:    She has no cervical adenopathy.  Neurological: She is alert and oriented to person, place, and time. GCS eye subscore is 4. GCS verbal subscore is 5.  Skin: Skin is warm, dry and intact.  Psychiatric: She has a normal mood and affect. Her behavior is normal. Cognition and memory are normal.    ASSESSMENT/PLAN  Hypertension-b/p is stable continue on current dosage of lisinopril, amlodipine and hydralazine Elevated TSH( hyperthyroidism)-Synthroid decreased to 100 mg, TSH and free T3 and T4 ordered ESRD- Dialysis at [facility] T-R-S; continue to collaborate with health care team  Chronic Back Pain- continue analgesic Norco prn; pt is currently stable on medication regimen Constipation-Miralax changed to prn   Follow up:prn

## 2013-01-05 ENCOUNTER — Ambulatory Visit: Payer: No Typology Code available for payment source | Admitting: Cardiovascular Disease

## 2013-01-05 NOTE — Progress Notes (Signed)
Patient ID: Nicole [name], female   DOB: 02-09-1933, 79 y.o.   MRN: [id number]        PROGRESS NOTE  DATE: 12/14/2012  FACILITY:  [facility]  LEVEL OF CARE: SNF (31)  Acute Visit  CHIEF COMPLAINT:  Manage hypothyroidism.    HISTORY OF PRESENT ILLNESS: I was requested by the staff to assess the patient regarding above problem(s):  HYPOTHYROIDISM: The hypothyroidism is unstable. No complications noted from the medications presently being used.  The patient denies fatigue or constipation.  Last TSH:   In 12/2012, TSH was 0.276.      PAST MEDICAL HISTORY : Reviewed.  No changes.  CURRENT MEDICATIONS: Reviewed per [facility]  REVIEW OF SYSTEMS:  GENERAL: no change in appetite, no fatigue, no weight changes, no fever, chills or weakness RESPIRATORY: no cough, SOB, DOE,, wheezing, hemoptysis CARDIAC: no chest pain, edema or palpitations GI: no abdominal pain, diarrhea, constipation, heart burn, nausea or vomiting  PHYSICAL EXAMINATION  GENERAL: no acute distress, normal body habitus NECK: supple, trachea midline, no neck masses, no thyroid tenderness, no thyromegaly RESPIRATORY: breathing is even & unlabored, BS CTAB CARDIAC: RRR, no murmur,no extra heart sounds, no edema GI: abdomen soft, normal BS, no masses, no tenderness, no hepatomegaly, no splenomegaly PSYCHIATRIC: the patient is alert & oriented to person, affect & behavior appropriate  ASSESSMENT/PLAN:  Hypothyroidism.  Unstable problem.  Decrease levothyroxine to 100 mcg q.d.  Check TSH in six weeks.    CPT CODE: 84696

## 2013-02-13 ENCOUNTER — Non-Acute Institutional Stay (SKILLED_NURSING_FACILITY): Payer: No Typology Code available for payment source | Admitting: Internal Medicine

## 2013-02-13 ENCOUNTER — Encounter (HOSPITAL_COMMUNITY): Payer: Self-pay | Admitting: Emergency Medicine

## 2013-02-13 ENCOUNTER — Emergency Department (HOSPITAL_COMMUNITY)
Admission: EM | Admit: 2013-02-13 | Discharge: 2013-02-13 | Disposition: A | Discharge status: Home / Self Care | Payer: No Typology Code available for payment source | Attending: Emergency Medicine | Admitting: Emergency Medicine

## 2013-02-13 ENCOUNTER — Encounter: Payer: Self-pay | Admitting: Internal Medicine

## 2013-02-13 DIAGNOSIS — Z87891 Personal history of nicotine dependence: Secondary | ICD-10-CM | POA: Insufficient documentation

## 2013-02-13 DIAGNOSIS — F341 Dysthymic disorder: Secondary | ICD-10-CM | POA: Insufficient documentation

## 2013-02-13 DIAGNOSIS — Z86711 Personal history of pulmonary embolism: Secondary | ICD-10-CM | POA: Insufficient documentation

## 2013-02-13 DIAGNOSIS — Z8719 Personal history of other diseases of the digestive system: Secondary | ICD-10-CM | POA: Insufficient documentation

## 2013-02-13 DIAGNOSIS — I150 Renovascular hypertension: Secondary | ICD-10-CM

## 2013-02-13 DIAGNOSIS — R5381 Other malaise: Secondary | ICD-10-CM | POA: Insufficient documentation

## 2013-02-13 DIAGNOSIS — K5900 Constipation, unspecified: Secondary | ICD-10-CM

## 2013-02-13 DIAGNOSIS — N186 End stage renal disease: Secondary | ICD-10-CM | POA: Insufficient documentation

## 2013-02-13 DIAGNOSIS — M1990 Unspecified osteoarthritis, unspecified site: Secondary | ICD-10-CM | POA: Insufficient documentation

## 2013-02-13 DIAGNOSIS — D631 Anemia in chronic kidney disease: Secondary | ICD-10-CM

## 2013-02-13 DIAGNOSIS — E039 Hypothyroidism, unspecified: Secondary | ICD-10-CM | POA: Insufficient documentation

## 2013-02-13 DIAGNOSIS — Z992 Dependence on renal dialysis: Secondary | ICD-10-CM | POA: Insufficient documentation

## 2013-02-13 DIAGNOSIS — Z79899 Other long term (current) drug therapy: Secondary | ICD-10-CM | POA: Insufficient documentation

## 2013-02-13 DIAGNOSIS — R531 Weakness: Secondary | ICD-10-CM

## 2013-02-13 DIAGNOSIS — Z862 Personal history of diseases of the blood and blood-forming organs and certain disorders involving the immune mechanism: Secondary | ICD-10-CM | POA: Insufficient documentation

## 2013-02-13 DIAGNOSIS — I120 Hypertensive chronic kidney disease with stage 5 chronic kidney disease or end stage renal disease: Secondary | ICD-10-CM | POA: Insufficient documentation

## 2013-02-13 LAB — CBC WITH DIFFERENTIAL/PLATELET
Basophils Absolute: 0 10*3/uL (ref 0.0–0.1)
Eosinophils Absolute: 0.5 10*3/uL (ref 0.0–0.7)
Eosinophils Relative: 6 % — ABNORMAL HIGH (ref 0–5)
Hemoglobin: 9.6 g/dL — ABNORMAL LOW (ref 12.0–15.0)
MCH: 32.4 pg (ref 26.0–34.0)
MCHC: 32.8 g/dL (ref 30.0–36.0)
MCV: 99 fL (ref 78.0–100.0)
Neutro Abs: 4.6 10*3/uL (ref 1.7–7.7)
Platelets: 279 10*3/uL (ref 150–400)
RDW: 15.7 % — ABNORMAL HIGH (ref 11.5–15.5)

## 2013-02-13 LAB — BASIC METABOLIC PANEL
Calcium: 9.5 mg/dL (ref 8.4–10.5)
GFR calc Af Amer: 9 mL/min — ABNORMAL LOW (ref >90)
GFR calc non Af Amer: 8 mL/min — ABNORMAL LOW (ref >90)
Glucose, Bld: 115 mg/dL — ABNORMAL HIGH (ref 70–99)
Sodium: 135 mEq/L (ref 135–145)

## 2013-02-13 NOTE — ED Provider Notes (Signed)
CSN: [id number]     Arrival date & time 02/13/13  1828 History   First MD Initiated Contact with Patient 02/13/13 2232     Chief Complaint  Patient presents with  . Altered Mental Status   (Consider location/radiation/quality/duration/timing/severity/associated sxs/prior Treatment) HPI Patient presents after an episode of weakness.  Patient states that after completing a full session of dialysis she felt jittery, generally weak.  There is no focal pain, no confusion, no lightheadedness, no syncope. Patient's symptoms lasted approximately one hour, resolved clear intervention. Since resolution been no recurrent events. Patient notes that she has had similar episodes after dialysis the past, also resolving without intervention. On my evaluation she has no complaints, states that she feels in her usual state of health. She denies confusion, disorientation, pain anywhere.  Past Medical History  Diagnosis Date  . Hypertension   . ESRD (end stage renal disease)   . Peripheral vascular disease     revascularization appx. 14 years ago  . Hypothyroidism   . Hiatal hernia   . Iron deficiency anemia   . Osteoarthritis   . Depression with anxiety   . Pulmonary embolism   . AAA (abdominal aortic aneurysm) 1999    aortic byfem bypass   Past Surgical History  Procedure Laterality Date  . Abdominal surgery  about 14 years ago    REVASCULARIZATION  . Ateriovenous graft  04/18/2009    LEFT FOREARM avg  . Av fistula placement, radiocephalic  01/09/10  . Avgg removal  12/02/09    SECONDARY TO INFECTION LT FOREARM  . Av fistula placement, radiocephalic  05/08/10    NONMATURED  . Arteriovenous graft placement  07/24/10    RIGHT FOREARM LOOP  . Dg av dialysis graft declot or  12/05/10, 6/17/123, 09/24/11, 01/26/12    CLOTTED GRAFT  . Angioplasty  12/21/11    of RUE AVG   History reviewed. No pertinent family history. History  Substance Use Topics  . Smoking status: Former Smoker -- 0.30  packs/day for 45 years    Quit date: 01/07/2012  . Smokeless tobacco: Not on file  . Alcohol Use: No   OB History   Grav Para Term Preterm Abortions TAB SAB Ect Mult Living                 Review of Systems  Constitutional:       Per HPI, otherwise negative  HENT:       Per HPI, otherwise negative  Respiratory:       Per HPI, otherwise negative  Cardiovascular:       Per HPI, otherwise negative  Gastrointestinal: Negative for vomiting.  Endocrine:       Negative aside from HPI  Genitourinary:       Neg aside from HPI   Musculoskeletal:       Per HPI, otherwise negative  Skin: Negative.   Neurological: Negative for syncope.    Allergies  Review of patient's allergies indicates no known allergies.  Home Medications   Current Outpatient Rx  Name  Route  Sig  Dispense  Refill  . amLODipine (NORVASC) 10 MG tablet   Oral   Take 10 mg by mouth at bedtime.         . docusate sodium (COLACE) 100 MG capsule   Oral   Take 100 mg by mouth 2 (two) times daily as needed for mild constipation.         . hydrALAZINE (APRESOLINE) 25 MG tablet   Oral  Take 25 mg by mouth 3 (three) times a week. 25 mg twice daily after dialysis days         . HYDROcodone-acetaminophen (NORCO) 10-325 MG per tablet   Oral   Take 1 tablet by mouth every 3 (three) hours as needed for moderate pain or severe pain.         Marland Kitchen levothyroxine (SYNTHROID, LEVOTHROID) 100 MCG tablet   Oral   Take 100 mcg by mouth daily before breakfast.         . lisinopril (PRINIVIL,ZESTRIL) 40 MG tablet   Oral   Take 40 mg by mouth at bedtime.         . mirtazapine (REMERON) 15 MG tablet   Oral   Take 15 mg by mouth at bedtime.         . Multiple Vitamin (MULTIVITAMIN WITH MINERALS) TABS tablet   Oral   Take 1 tablet by mouth every morning.         . polyethylene glycol (MIRALAX / GLYCOLAX) packet   Oral   Take 17 g by mouth daily as needed for mild constipation or moderate constipation.           BP 103/35  Pulse 63  Temp(Src) 97.1 F (36.2 C) (Oral)  Resp 12  SpO2 100% Physical Exam  Nursing note and vitals reviewed. Constitutional: She is oriented to person, place, and time. She appears well-developed and well-nourished. No distress.  HENT:  Head: Normocephalic and atraumatic.  Eyes: Conjunctivae and EOM are normal.  Cardiovascular: Normal rate and regular rhythm.   Pulmonary/Chest: Effort normal and breath sounds normal. No stridor. No respiratory distress.  Abdominal: She exhibits no distension.  Musculoskeletal: She exhibits no edema.  Right forearm graft with palpable pulse  Neurological: She is alert and oriented to person, place, and time. No cranial nerve deficit.  Skin: Skin is warm and dry.  Psychiatric: She has a normal mood and affect.    ED Course  Procedures (including critical care time) Labs Review Labs Reviewed  CBC WITH DIFFERENTIAL - Abnormal; Notable for the following:    RBC 2.96 (*)    Hemoglobin 9.6 (*)    HCT 29.3 (*)    RDW 15.7 (*)    Eosinophils Relative 6 (*)    All other components within normal limits  BASIC METABOLIC PANEL - Abnormal; Notable for the following:    Chloride 94 (*)    Glucose, Bld 115 (*)    BUN 31 (*)    Creatinine, Ser 4.97 (*)    GFR calc non Af Amer 8 (*)    GFR calc Af Amer 9 (*)    All other components within normal limits   Imaging Review No results found.  EKG Interpretation    Date/Time:  Tuesday February 13 2013 21:33:13 EST Ventricular Rate:  82 PR Interval:  124 QRS Duration: 74 QT Interval:  366 QTC Calculation: 427 R Axis:   53 Text Interpretation:  Sinus rhythm with occasional Premature ventricular complexes and Premature atrial complexes Moderate voltage criteria for LVH, may be normal variant Nonspecific T wave abnormality Abnormal ECG Sinus rhythm Premature atrial complexes T wave abnormality Abnormal ekg Confirmed by Gerhard Munch  MD ([id number]) on 02/13/2013 10:51:41  PM            MDM   1. Weakness    This patient presents after an episode of weakness that occurred following dialysis.  On exam the patient is awake and alert, in no  distress, denying any complaints.  Patient's labs are consistent with prior evaluation for her.  Her denial of any complaints, her reassuring labs, vital signs are all suggested fluid changes from dialysis as contributory.  Patient was discharged in stable condition.    Gerhard Munch, MD 02/13/13 2252

## 2013-02-13 NOTE — ED Notes (Signed)
Per EMS, pt with altered mental status where she was noted to be jittery.  Pt was found to be bradycardic with heart rate in the 40's.  Upon EMS arrival, pt's VSS and stating that she "feels less jittery".  Pt alert and oriented x 4; denies n/v/d, cp, and shortness of breath.

## 2013-02-13 NOTE — Progress Notes (Signed)
PROGRESS NOTE  DATE: 02-13-13  FACILITY: Nursing Home Location: [facility]  LEVEL OF CARE: SNF (31)  Routine Visit  CHIEF COMPLAINT:  Manage hypothyroidism, HTN & constipation  HISTORY OF PRESENT ILLNESS:  REASSESSMENT OF ONGOING PROBLEM(S):  HYPOTHYROIDISM: The hypothyroidism remains stable. No complications noted from the medications presently being used.  The patient denies fatigue or constipation.  Last TSH 0.051 on 11-08-12, in 11-14 TSH 0.513  HTN: Pt 's HTN remains stable.  Denies CP, sob, DOE, pedal edema, headaches, dizziness or visual disturbances.  No complications from the medications currently being used.  Last BP :140/70, 132/70, 110/58  CONSTIPATION: The constipation remains stable. No complications from the medications presently being used. Patient denies ongoing constipation, abdominal pain, nausea or vomiting.  PAST MEDICAL HISTORY : Reviewed.  No changes.  CURRENT MEDICATIONS: Reviewed per [facility]  REVIEW OF SYSTEMS:  GENERAL: no change in appetite, no fatigue, no weight changes, no fever, chills or weakness RESPIRATORY: no cough, SOB, DOE, wheezing, hemoptysis CARDIAC: no chest pain, edema or palpitations GI: no abdominal pain, diarrhea, constipation, heart burn, nausea or vomiting  PHYSICAL EXAMINATION  VS:  T 97.8.      P 70     RR 16   BP 110/58     POX %     WT (Lb) 107.5  GENERAL: no acute distress, normal body habitus EYES: conjunctivae normal, sclerae normal, normal eye lids NECK: supple, trachea midline, no neck masses, no thyroid tenderness, no thyromegaly LYMPHATICS: no LAN in the neck, no supraclavicular LAN RESPIRATORY: breathing is even & unlabored, BS CTAB CARDIAC: RRR, no murmur,no extra heart sounds, no edema GI: abdomen soft, normal BS, no masses, no tenderness, no hepatomegaly, no splenomegaly PSYCHIATRIC: the patient is alert & oriented to person, affect & behavior appropriate  LABS/RADIOLOGY:  8/14 AP 130 ow  liver profile nl  7/14 wbc 6.3, Hb 10.9, mcv 101, plt 406, cr 6.16, Ca 10.8 ow bmp nl, vitamin B12 level 1259, folate 16.6  ASSESSMENT/PLAN:  Hypothyroidism-well controlled Renovascular HTN-well controlled. Constipation -well controlled. Anemia of chronic kidney dz-stable ESRD-on HD Thrombocytosis-acute phase reactant.  Will monitor.  CPT CODE: 16109

## 2013-02-13 NOTE — ED Notes (Signed)
Unable to obtain an accurate pulse ox hr. Pt original HR per EMS was 40, pt found here at 18:30 at 120, EKG ordered for accurate HR and rhythm.

## 2013-02-13 NOTE — ED Notes (Signed)
Dr. Jeraldine Loots made aware of low BP, states pt OK for discharge. Pt and pt's son comfortable with d/c and f/u instructions. No prescriptions.

## 2013-02-13 NOTE — ED Notes (Signed)
MD at Bedside.

## 2013-03-15 ENCOUNTER — Encounter: Payer: Self-pay | Admitting: *Deleted

## 2013-03-17 ENCOUNTER — Encounter: Payer: Self-pay | Admitting: *Deleted

## 2013-03-21 ENCOUNTER — Non-Acute Institutional Stay (SKILLED_NURSING_FACILITY): Payer: Medicare Other | Admitting: Family

## 2013-03-21 ENCOUNTER — Encounter: Payer: Self-pay | Admitting: Family

## 2013-03-21 DIAGNOSIS — K5900 Constipation, unspecified: Secondary | ICD-10-CM

## 2013-03-21 DIAGNOSIS — E039 Hypothyroidism, unspecified: Secondary | ICD-10-CM

## 2013-03-21 DIAGNOSIS — I10 Essential (primary) hypertension: Secondary | ICD-10-CM

## 2013-03-21 DIAGNOSIS — N186 End stage renal disease: Secondary | ICD-10-CM

## 2013-03-21 DIAGNOSIS — Z992 Dependence on renal dialysis: Principal | ICD-10-CM

## 2013-03-21 NOTE — Progress Notes (Signed)
Patient ID: Nicole [name], female   DOB: 06/12/1932, 80 y.o.   MRN: [id number]  Date: 03/21/13 Facility: [facility]  Code Status:  Full   Chief Complaint  Patient presents with  . Medical Managment of Chronic Issues    Routine Visit    HPI: Pt is being followed for the medical management of chronic illnesses. Pt and health care team denies further issues/concerns at present.      No Known Allergies    Medication List       This list is accurate as of: 03/21/13  4:51 PM.  Always use your most recent med list.               amLODipine 10 MG tablet  Commonly known as:  NORVASC  Take 10 mg by mouth at bedtime.     docusate sodium 100 MG capsule  Commonly known as:  COLACE  Take 100 mg by mouth 2 (two) times daily as needed for mild constipation.     hydrALAZINE 25 MG tablet  Commonly known as:  APRESOLINE  Take 25 mg by mouth 3 (three) times a week. 25 mg twice daily after dialysis days     HYDROcodone-acetaminophen 10-325 MG per tablet  Commonly known as:  NORCO  Take 1 tablet by mouth every 3 (three) hours as needed for moderate pain or severe pain.     levothyroxine 100 MCG tablet  Commonly known as:  SYNTHROID, LEVOTHROID  Take 100 mcg by mouth daily before breakfast.     lisinopril 40 MG tablet  Commonly known as:  PRINIVIL,ZESTRIL  Take 40 mg by mouth at bedtime.     mirtazapine 15 MG tablet  Commonly known as:  REMERON  Take 15 mg by mouth at bedtime.     multivitamin with minerals Tabs tablet  Take 1 tablet by mouth every morning.     polyethylene glycol packet  Commonly known as:  MIRALAX / GLYCOLAX  Take 17 g by mouth daily as needed for mild constipation or moderate constipation.         DATA REVIEWED   Laboratory Studies: 09/22/12-WBC 6.3, Hemoglobin 10.9, Hematocrit 33, Platelets 406, BUN 20, Creatinine 6.16,Cl 92     Past Medical History  Diagnosis Date  . Hypertension   . ESRD (end stage renal disease)   . Peripheral  vascular disease     revascularization appx. 14 years ago  . Hypothyroidism   . Hiatal hernia   . Iron deficiency anemia   . Osteoarthritis   . Depression with anxiety   . Pulmonary embolism   . AAA (abdominal aortic aneurysm) 1999    aortic byfem bypass  . Anemia in chronic kidney disease(285.21)   . Spinal stenosis, lumbar region, without neurogenic claudication   . Other specified disease of white blood cells   . Hypopotassemia   . Other abnormal blood chemistry   . Macrocytosis   . Depression   . Anxiety      Past Surgical History  Procedure Laterality Date  . Abdominal surgery  about 14 years ago    REVASCULARIZATION  . Ateriovenous graft  04/18/2009    LEFT FOREARM avg  . Av fistula placement, radiocephalic  85/27/78  . Avgg removal  12/02/09    SECONDARY TO INFECTION LT FOREARM  . Av fistula placement, radiocephalic  04/11/21    NONMATURED  . Arteriovenous graft placement  07/24/10    RIGHT FOREARM LOOP  . Dg av dialysis graft  declot or  12/05/10, 6/17/123, 09/24/11, 01/26/12    CLOTTED GRAFT  . Angioplasty  12/21/11    of RUE AVG     Review of Systems  Constitutional: Negative.   HENT: Negative.   Eyes: Negative.   Respiratory: Negative.   Cardiovascular: Negative.   Gastrointestinal: Negative.   Genitourinary: Negative.   Musculoskeletal: Negative.   Skin:       Dialysis Access RUA  Neurological: Negative.   Endo/Heme/Allergies: Negative.   Psychiatric/Behavioral: Negative.      Physical Exam Filed Vitals:   03/21/13 1647  BP: 142/64  Pulse: 74  Temp: 97.5 F (36.4 C)  Resp: 20   There is no weight on file to calculate BMI. Physical Exam  Constitutional: She is oriented to person, place, and time.  Cardiovascular: Normal rate and regular rhythm.   Pulmonary/Chest: Effort normal and breath sounds normal.  Genitourinary:  RUA Dialysis Access intact  Neurological: She is alert and oriented to person, place, and time.  Skin: Skin is warm and  dry.    ASSESSMENT/PLAN  ESRD-will cont with dialysis; pt is stable HTN-will cont with current medication tx; pt is stable Hypothyroidism-will cont current current tx; pt TSH is normal Unspecified Constipation-will cont current tx Initiate wean of Norco, obtain CBC, CMP, Vitamin B 12, Vitamin D  Follow up:prn  Time Spent: 50 minutes

## 2013-03-22 ENCOUNTER — Other Ambulatory Visit: Payer: Self-pay | Admitting: *Deleted

## 2013-03-22 MED ORDER — HYDROCODONE-ACETAMINOPHEN 10-325 MG PO TABS: ORAL_TABLET | ORAL | Status: DC

## 2013-03-26 ENCOUNTER — Encounter: Payer: Self-pay | Admitting: Family

## 2013-03-27 NOTE — Progress Notes (Signed)
This encounter was created in error - please disregard.

## 2013-03-28 ENCOUNTER — Encounter: Payer: Self-pay | Admitting: Internal Medicine

## 2013-04-06 ENCOUNTER — Ambulatory Visit: Payer: No Typology Code available for payment source | Admitting: Internal Medicine

## 2013-04-11 ENCOUNTER — Ambulatory Visit: Payer: No Typology Code available for payment source | Admitting: Internal Medicine

## 2013-04-11 ENCOUNTER — Encounter: Payer: Self-pay | Admitting: Surgery

## 2013-04-20 ENCOUNTER — Encounter: Payer: Self-pay | Admitting: Surgery

## 2013-04-23 ENCOUNTER — Ambulatory Visit (INDEPENDENT_AMBULATORY_CARE_PROVIDER_SITE_OTHER): Payer: Medicare Other | Admitting: Surgery

## 2013-04-23 ENCOUNTER — Encounter: Payer: Self-pay | Admitting: Surgery

## 2013-04-23 VITALS — BP 115/44 | HR 68 | Ht 62.0 in | Wt 112.0 lb

## 2013-04-23 DIAGNOSIS — N186 End stage renal disease: Secondary | ICD-10-CM

## 2013-04-23 NOTE — Progress Notes (Signed)
Patient name: Nicole [name] MRN: [id number] DOB: [birth year] Sex: female     Chief Complaint  Patient presents with  . New Evaluation    new access - failing AVG    HISTORY OF PRESENT ILLNESS: The patient is here today for potential new access.  By her report her right forearm graft has clotted several times within the past couple of months and a new graft has been recommended.  She is currently dialyzing via a right forearm graft which has been functioning well at dialysis.  Past Medical History  Diagnosis Date  . Hypertension   . ESRD (end stage renal disease)   . Peripheral vascular disease     revascularization appx. 14 years ago  . Hypothyroidism   . Hiatal hernia   . Iron deficiency anemia   . Osteoarthritis   . Depression with anxiety   . Pulmonary embolism   . AAA (abdominal aortic aneurysm) 1999    aortic byfem bypass  . Anemia in chronic kidney disease(285.21)   . Spinal stenosis, lumbar region, without neurogenic claudication   . Other specified disease of white blood cells   . Hypopotassemia   . Other abnormal blood chemistry   . Macrocytosis   . Depression   . Anxiety     Past Surgical History  Procedure Laterality Date  . Abdominal surgery  about 14 years ago    REVASCULARIZATION  . Ateriovenous graft  04/18/2009    LEFT FOREARM avg  . Av fistula placement, radiocephalic  28/36/62  . Avgg removal  12/02/09    SECONDARY TO INFECTION LT FOREARM  . Av fistula placement, radiocephalic  11/10/74    NONMATURED  . Arteriovenous graft placement  07/24/10    RIGHT FOREARM LOOP  . Dg av dialysis graft declot or  12/05/10, 6/17/123, 09/24/11, 01/26/12    CLOTTED GRAFT  . Angioplasty  12/21/11    of RUE AVG    History   Social History  . Marital Status: Widowed    Spouse Name: N/A    Number of Children: N/A  . Years of Education: N/A   Occupational History  . Not on file.   Social History Main Topics  . Smoking status: Former Smoker -- 0.30 packs/day  for 45 years    Quit date: 01/07/2012  . Smokeless tobacco: Not on file  . Alcohol Use: No  . Drug Use: No  . Sexual Activity: No   Other Topics Concern  . Not on file   Social History Narrative  . No narrative on file    History reviewed. No pertinent family history.  Allergies as of 04/23/2013  . (No Known Allergies)    Current Outpatient Prescriptions on File Prior to Visit  Medication Sig Dispense Refill  . acetaminophen (TYLENOL) 650 MG CR tablet Take 650 mg by mouth every 4 (four) hours as needed for pain. Give 650 mg of tylenol by mouth as needed.      Marland Kitchen amLODipine (NORVASC) 10 MG tablet Take 10 mg by mouth at bedtime.      . docusate sodium (COLACE) 100 MG capsule Take 100 mg by mouth 2 (two) times daily as needed for mild constipation.      . hydrALAZINE (APRESOLINE) 25 MG tablet Take 25 mg by mouth 3 (three) times a week. 25 mg twice daily after dialysis days      . HYDROcodone-acetaminophen (NORCO) 10-325 MG per tablet Take one tablet by mouth every 4 hours as needed  for 1 week then begin every 6 hours as needed for pain  162 tablet  0  . levothyroxine (SYNTHROID, LEVOTHROID) 100 MCG tablet Take 100 mcg by mouth daily before breakfast.      . mirtazapine (REMERON) 15 MG tablet Take 15 mg by mouth at bedtime.      . polyethylene glycol (MIRALAX / GLYCOLAX) packet Take 17 g by mouth daily as needed for mild constipation or moderate constipation.      Marland Kitchen lisinopril (PRINIVIL,ZESTRIL) 40 MG tablet Take 40 mg by mouth at bedtime.      . Multiple Vitamin (MULTIVITAMIN WITH MINERALS) TABS tablet Take 1 tablet by mouth every morning.       No current facility-administered medications on file prior to visit.     REVIEW OF SYSTEMS: Cardiovascular: No chest pain, chest pressure, palpitations, orthopnea, or dyspnea on exertion. No claudication or rest pain,  No history of DVT or phlebitis. Pulmonary: No productive cough, asthma or wheezing. Neurologic: No weakness, paresthesias,  aphasia, or amaurosis. No dizziness. Hematologic: No bleeding problems or clotting disorders. Musculoskeletal: No joint pain or joint swelling. Gastrointestinal: No blood in stool or hematemesis Genitourinary: No dysuria or hematuria. Psychiatric:: No history of major depression. Integumentary: No rashes or ulcers. Constitutional: No fever or chills.  PHYSICAL EXAMINATION:   Vital signs are BP 115/44  Pulse 68  Ht 5\' 2"  (1.575 m)  Wt 112 lb (50.803 kg)  BMI 20.48 kg/m2  SpO2 98% General: The patient appears their stated age. HEENT:  No gross abnormalities Pulmonary:  Non labored breathing Musculoskeletal: There are no major deformities. Neurologic: No focal weakness or paresthesias are detected, Skin: There are no ulcer or rashes noted. Psychiatric: The patient has normal affect. Cardiovascular: There is a regular rate and rhythm without significant murmur appreciated.  Good thrill within the right forearm graft.  No arm swelling.   Diagnostic Studies None  Assessment: End stage renal disease Plan: The patient currently has a functioning right forearm graft which has reportedly occluded several times recently.  I do not have any images to review or any reports to review to determine what the concern is regarding why this graft is failing.  I have reviewed a shuntogram done at [facility] several months ago which shows a patent central venous system.  An order for me to make the appropriate recommendation, I will need additional imaging to see why the existing graft is having significant problems.  Regardless, I would not recommend placing a new graft until this one is deemed non-salvageable.  If and when the right forearm graft re\re oh occludes, I would recommend placing a catheter, letting me see the images of her existing or previous studies and then determining whether or not she would be a candidate for a right upper arm graft  V. Leia Alf, M.D. [facility] [place] Office: [phone] Pager:  [phone]

## 2013-04-24 ENCOUNTER — Ambulatory Visit: Payer: No Typology Code available for payment source | Admitting: Internal Medicine

## 2013-05-01 ENCOUNTER — Ambulatory Visit: Payer: No Typology Code available for payment source | Admitting: Internal Medicine

## 2013-05-07 ENCOUNTER — Ambulatory Visit (INDEPENDENT_AMBULATORY_CARE_PROVIDER_SITE_OTHER): Payer: Medicare Other | Admitting: Internal Medicine

## 2013-05-07 ENCOUNTER — Other Ambulatory Visit (HOSPITAL_COMMUNITY): Payer: Self-pay | Admitting: Unknown Physician Specialty

## 2013-05-07 ENCOUNTER — Encounter: Payer: Self-pay | Admitting: Internal Medicine

## 2013-05-07 VITALS — BP 140/60 | HR 64 | Ht 62.0 in | Wt 110.0 lb

## 2013-05-07 DIAGNOSIS — R001 Bradycardia, unspecified: Secondary | ICD-10-CM

## 2013-05-07 DIAGNOSIS — I498 Other specified cardiac arrhythmias: Secondary | ICD-10-CM

## 2013-05-07 DIAGNOSIS — I10 Essential (primary) hypertension: Secondary | ICD-10-CM

## 2013-05-07 NOTE — Progress Notes (Signed)
HPI Nicole [name] is referred today by Dr. Arty Baumgartner for consideration for PPM insertion. She is a pleasant 80 yo woman with a h/o ESRD on HD, who has been noted to be bradycardic with HR's in the low 50's at times. She has never had syncope. She denies symptoms associated with bradycardia except for chronic fatigue. She cannot tell me how long she has been on HD, raising a question of dementia. She has had problems with hypotension during dialysis. She denies peripheral edema. She is on no AV nodal or sinus nodal blocking drugs except for amlodipine.  No Known Allergies   Current Outpatient Prescriptions  Medication Sig Dispense Refill  . acetaminophen (TYLENOL) 325 MG tablet Take 650 mg by mouth every 4 (four) hours as needed.      Marland Kitchen amLODipine (NORVASC) 10 MG tablet Take 10 mg by mouth at bedtime.      . docusate sodium (COLACE) 100 MG capsule Take 100 mg by mouth 2 (two) times daily as needed for mild constipation.      . hydrALAZINE (APRESOLINE) 25 MG tablet Take 25 mg twice daily after dialysis days      . HYDROcodone-acetaminophen (NORCO) 10-325 MG per tablet Take one tablet by mouth every 6 hours as needed for pain      . levothyroxine (SYNTHROID, LEVOTHROID) 100 MCG tablet Take 100 mcg by mouth daily before breakfast.      . lisinopril (PRINIVIL,ZESTRIL) 40 MG tablet Take 40 mg by mouth at bedtime.      . mirtazapine (REMERON) 15 MG tablet Take 15 mg by mouth at bedtime.      . Multiple Vitamins-Minerals (CERTA-VITE PO) Take 1 tablet by mouth daily.      . polyethylene glycol (MIRALAX / GLYCOLAX) packet Take 17 g by mouth daily as needed for mild constipation or moderate constipation.       No current facility-administered medications for this visit.     Past Medical History  Diagnosis Date  . Hypertension   . ESRD (end stage renal disease)   . Peripheral vascular disease     revascularization appx. 14 years ago  . Hypothyroidism   . Hiatal hernia   . Iron deficiency  anemia   . Osteoarthritis   . Depression with anxiety   . Pulmonary embolism   . AAA (abdominal aortic aneurysm) 1999    aortic byfem bypass  . Anemia in chronic kidney disease(285.21)   . Spinal stenosis, lumbar region, without neurogenic claudication   . Other specified disease of white blood cells   . Hypopotassemia   . Other abnormal blood chemistry   . Macrocytosis   . Depression   . Anxiety   . Bradycardia   . H/O prolonged Q-T interval on ECG     Referral to EP    ROS:   All systems reviewed and negative except as noted in the HPI.   Past Surgical History  Procedure Laterality Date  . Abdominal surgery  about 14 years ago    REVASCULARIZATION  . Ateriovenous graft  04/18/2009    LEFT FOREARM avg  . Av fistula placement, radiocephalic  41/66/06  . Avgg removal  12/02/09    SECONDARY TO INFECTION LT FOREARM  . Av fistula placement, radiocephalic  3/0/16    NONMATURED  . Arteriovenous graft placement  07/24/10    RIGHT FOREARM LOOP  . Dg av dialysis graft declot or  12/05/10, 6/17/123, 09/24/11, 01/26/12    CLOTTED GRAFT  .  Angioplasty  12/21/11    of RUE AVG     No family history on file.   History   Social History  . Marital Status: Widowed    Spouse Name: N/A    Number of Children: N/A  . Years of Education: N/A   Occupational History  . Not on file.   Social History Main Topics  . Smoking status: Former Smoker -- 0.30 packs/day for 45 years    Quit date: 01/07/2012  . Smokeless tobacco: Not on file  . Alcohol Use: No  . Drug Use: No  . Sexual Activity: No   Other Topics Concern  . Not on file   Social History Narrative  . No narrative on file     BP 140/60  Pulse 64  Ht 5\' 2"  (1.575 m)  Wt 110 lb (49.896 kg)  BMI 20.11 kg/m2  Physical Exam:  Stable but frail appearing 80 yo woman, NAD HEENT: Unremarkable Neck:  No JVD, no thyromegally Back:  No CVA tenderness Lungs:  Clear with no wheezes, rales, or rhonchi HEART:  Regular rate  rhythm, no murmurs, no rubs, no clicks Abd:  soft, positive bowel sounds, no organomegally, no rebound, no guarding Ext:  2 plus pulses, no edema, no cyanosis, no clubbing Skin:  No rashes no nodules Neuro:  CN II through XII intact, motor grossly intact  EKG - NSR with PVC's   Assess/Plan:

## 2013-05-07 NOTE — Assessment & Plan Note (Signed)
Her blood pressure is minimally elevated today. Will follow.

## 2013-05-07 NOTE — Patient Instructions (Signed)
Your physician recommends that you continue on your current medications as directed. Please refer to the Current Medication list given to you today.  Follow up as needed with Dr. Taylor 

## 2013-05-07 NOTE — Assessment & Plan Note (Signed)
She appears to have asymptomatic bradycardia. She is not a good historian and I cannot rule out the presence of symptomatic bradyarrhythmias. For now I would recommend watchful waiting. I will try and contact her referring MD and see if there is more to her story. I would currently not recommend a PPM at this time.

## 2013-05-24 ENCOUNTER — Encounter: Payer: Self-pay | Admitting: Vascular Surgery

## 2013-05-25 ENCOUNTER — Ambulatory Visit (INDEPENDENT_AMBULATORY_CARE_PROVIDER_SITE_OTHER): Payer: Medicare Other | Admitting: Vascular Surgery

## 2013-05-25 ENCOUNTER — Encounter: Payer: Self-pay | Admitting: Vascular Surgery

## 2013-05-25 VITALS — BP 128/37 | HR 85 | Ht 62.0 in | Wt 118.0 lb

## 2013-05-25 DIAGNOSIS — N186 End stage renal disease: Secondary | ICD-10-CM

## 2013-05-25 NOTE — Progress Notes (Signed)
    Established Dialysis Access  History of Present Illness  Nicole [name] is a 80 y.o. (01-Jun-1932) female who presents for re-evaluation for permanent access.  The patient is right hand dominant.  Previous access procedures have been completed in both arms.  The patient's complication from previous access procedures include: thrombosis.  The patient currently dialyzes from a R FA loop AVG which have been stent twice.  Consult is for possible new access.  The patient has not had a previous PPM placed.  Pt recently saw Dr. Trula Slade for the exact same issue  The patient's PMH, PSH, SH, FamHx, Med, and Allergies are unchanged from 04/23/13.  On ROS today: no steal syndrome, no bleeding complications from access  Physical Examination  Filed Vitals:   05/25/13 1404  BP: 128/37  Pulse: 85  Height: 5\' 2"  (1.575 m)  Weight: 118 lb (53.524 kg)  SpO2: 88%   Body mass index is 21.58 kg/(m^2).  General: A&O x 3, WD, elderly  Pulmonary: Sym exp, good air movt, CTAB, no rales, rhonchi, & wheezing  Cardiac: RRR, Nl S1, S2, no Murmurs, rubs or gallops  Vascular: Vessel Right Left  Radial Not Palpable Not Palpable  Ulnar Not Palpable Not Palpable  Brachial  Palpable Palpable   Gastrointestinal: soft, NTND, -G/R, - HSM, - masses, - CVAT B  Musculoskeletal: M/S 5/5 throughout , Extremities without  ischemic changes ,  palpable thrill in access in FA loop AVG in R arm, + bruit in access  Neurologic: Pain and light touch intact in extremities , Motor exam as listed above  Outside Studies/Documentation 10 pages of outside documents were reviewed including: outpatient shuntogram, nephrology chart.  Medical Decision Making  Nicole [name] is a 80 y.o. female who presents with ESRD requiring hemodialysis.   R FA AVG is functional, so I would try to keep this access functional as long as possible.  Venous stents are already placed, so no point trying to jump a graft more proximally.  The more  proximal brachial vein stent makes such a maneuver useless.   The left arm has already been used for a thrombosed graft, so I would not pursue access placement in that arm without additional venogram in that arm.  Based on vein mapping and examination, this patient's permanent access options include: RUA AVG  Let us know when this patient's access fails.  A TDC placement will also be needed.  Adele Barthel, MD [facility] [place] Office: [phone] Pager: [phone]  05/25/2013, 3:37 PM

## 2013-06-13 ENCOUNTER — Encounter: Payer: Self-pay | Admitting: Vascular Surgery

## 2013-06-14 ENCOUNTER — Encounter: Payer: Self-pay | Admitting: Vascular Surgery

## 2013-06-14 ENCOUNTER — Ambulatory Visit (INDEPENDENT_AMBULATORY_CARE_PROVIDER_SITE_OTHER): Payer: Medicare Other | Admitting: Vascular Surgery

## 2013-06-14 ENCOUNTER — Other Ambulatory Visit: Payer: Self-pay | Admitting: Vascular Surgery

## 2013-06-14 ENCOUNTER — Other Ambulatory Visit: Payer: Self-pay | Admitting: *Deleted

## 2013-06-14 ENCOUNTER — Encounter: Payer: Self-pay | Admitting: *Deleted

## 2013-06-14 ENCOUNTER — Ambulatory Visit (HOSPITAL_COMMUNITY)
Admission: RE | Admit: 2013-06-14 | Discharge: 2013-06-14 | Disposition: A | Discharge status: Home / Self Care | Payer: Medicare Other | Source: Ambulatory Visit | Attending: Vascular Surgery | Admitting: Vascular Surgery

## 2013-06-14 VITALS — BP 120/46 | HR 78 | Temp 98.2°F | Resp 18 | Ht 62.0 in | Wt 108.0 lb

## 2013-06-14 DIAGNOSIS — L98499 Non-pressure chronic ulcer of skin of other sites with unspecified severity: Principal | ICD-10-CM

## 2013-06-14 DIAGNOSIS — I7025 Atherosclerosis of native arteries of other extremities with ulceration: Secondary | ICD-10-CM | POA: Insufficient documentation

## 2013-06-14 DIAGNOSIS — I999 Unspecified disorder of circulatory system: Secondary | ICD-10-CM

## 2013-06-14 DIAGNOSIS — I739 Peripheral vascular disease, unspecified: Secondary | ICD-10-CM

## 2013-06-14 DIAGNOSIS — L97909 Non-pressure chronic ulcer of unspecified part of unspecified lower leg with unspecified severity: Secondary | ICD-10-CM | POA: Insufficient documentation

## 2013-06-14 DIAGNOSIS — I998 Other disorder of circulatory system: Secondary | ICD-10-CM

## 2013-06-14 DIAGNOSIS — F17200 Nicotine dependence, unspecified, uncomplicated: Secondary | ICD-10-CM

## 2013-06-14 MED ORDER — TRAMADOL HCL 50 MG PO TABS: ORAL_TABLET | ORAL | Status: DC

## 2013-06-14 NOTE — Progress Notes (Signed)
[facility] [place] HISTORY AND PHYSICAL   History of Present Illness:  Nicole [name] is a 80 y.o. year old female who presents for evaluation of gangrene right foot. The Nicole [name] states that the toes of her right foot had been slowly progressively worse over the last month. She states originally she had an ulcer from trimming her nails too closely. She has chronic pain in the right foot.  She had some type of "revascularization 14 years ago" she does not remember what this was. Her history states aortobifemoral bypass.Review of an old CT scan shows this is probably an aortobiiliac bypass.  She does not really describe claudication. However she is only minimally ambulatory. She is on chronic hemodialysis. Her dialysis today is Tuesday Thursday Saturday. She currently dialyzes from a right-sided catheter. She has had a failed right forearm graft. She does not describe any problems a left foot. She states that she does have a dark-colored toe on the left foot. Other medical problems include hypertension, anemia spinal stenosis prior pulmonary embolus, bradycardia. All these are currently controlled. She is currently smoking. Greater than 3 minutes they spent regarding smoking cessation counseling.  Past Medical History  Diagnosis Date  . Hypertension   . ESRD (end stage renal disease)   . Peripheral vascular disease     revascularization appx. 14 years ago  . Hypothyroidism   . Hiatal hernia   . Iron deficiency anemia   . Osteoarthritis   . Depression with anxiety   . Pulmonary embolism   . AAA (abdominal aortic aneurysm) 1999    aortic byfem bypass  . Anemia in chronic kidney disease(285.21)   . Spinal stenosis, lumbar region, without neurogenic claudication   . Other specified disease of white blood cells   . Hypopotassemia   . Other abnormal blood chemistry   . Macrocytosis   . Depression   . Anxiety   . Bradycardia   . H/O prolonged Q-T interval on ECG     Referral to EP     Past Surgical History  Procedure Laterality Date  . Abdominal surgery  about 14 years ago    REVASCULARIZATION  . Ateriovenous graft  04/18/2009    LEFT FOREARM avg  . Av fistula placement, radiocephalic  25/36/64  . Avgg removal  12/02/09    SECONDARY TO INFECTION LT FOREARM  . Av fistula placement, radiocephalic  4/0/34    NONMATURED  . Arteriovenous graft placement  07/24/10    RIGHT FOREARM LOOP  . Dg av dialysis graft declot or  12/05/10, 6/17/123, 09/24/11, 01/26/12    CLOTTED GRAFT  . Angioplasty  12/21/11    of RUE AVG    Social History History  Substance Use Topics  . Smoking status: Former Smoker -- 0.30 packs/day for 45 years    Quit date: 01/07/2012  . Smokeless tobacco: Not on file  . Alcohol Use: No    Family History No family history on file.  Allergies  No Known Allergies   Current Outpatient Prescriptions  Medication Sig Dispense Refill  . acetaminophen (TYLENOL) 325 MG tablet Take 650 mg by mouth every 4 (four) hours as needed.      Marland Kitchen amLODipine (NORVASC) 10 MG tablet Take 10 mg by mouth at bedtime.      . calcium acetate (PHOSLO) 667 MG capsule       . docusate sodium (COLACE) 100 MG capsule Take 100 mg by mouth 2 (two) times daily as needed for mild constipation.      Marland Kitchen  hydrALAZINE (APRESOLINE) 25 MG tablet Take 25 mg twice daily after dialysis days      . HYDROcodone-acetaminophen (NORCO) 10-325 MG per tablet Take one tablet by mouth every 6 hours as needed for pain      . levothyroxine (SYNTHROID, LEVOTHROID) 100 MCG tablet Take 100 mcg by mouth daily before breakfast.      . lisinopril (PRINIVIL,ZESTRIL) 40 MG tablet Take 40 mg by mouth at bedtime.      . mirtazapine (REMERON) 15 MG tablet Take 15 mg by mouth at bedtime.      . Multiple Vitamins-Minerals (CERTA-VITE PO) Take 1 tablet by mouth daily.      . polyethylene glycol (MIRALAX / GLYCOLAX) packet Take 17 g by mouth daily as needed for mild constipation or moderate constipation.      .  traMADol (ULTRAM) 50 MG tablet Take one tablet by mouth three times daily for pain  90 tablet  5   No current facility-administered medications for this visit.    ROS:   General:  No weight loss, Fever, chills  HEENT: No recent headaches, no nasal bleeding, no visual changes, no sore throat  Neurologic: No dizziness, blackouts, seizures. No recent symptoms of stroke or mini- stroke. No recent episodes of slurred speech, or temporary blindness.  Cardiac: No recent episodes of chest pain/pressure, no shortness of breath at rest.  No shortness of breath with exertion.  Denies history of atrial fibrillation or irregular heartbeat  Vascular: No history of rest pain in feet.  No history of claudication.  No history of non-healing ulcer, No history of DVT   Pulmonary: No home oxygen, no productive cough, no hemoptysis,  No asthma or wheezing  Musculoskeletal:  [ ]  Arthritis, [ ]  Low back pain,  [ ]  Joint pain  Hematologic:No history of hypercoagulable state.  No history of easy bleeding.  No history of anemia  Gastrointestinal: No hematochezia or melena,  No gastroesophageal reflux, no trouble swallowing  Urinary: [ ]  chronic Kidney disease, [ ]  on HD - [ ]  MWF or [ ]  TTHS, [ ]  Burning with urination, [ ]  Frequent urination, [ ]  Difficulty urinating;   Skin: No rashes  Psychological: No history of anxiety,  No history of depression   Physical Examination   General:  Alert and oriented, no acute distress, frail appearing HEENT: Normal Neck: No bruit or JVD Pulmonary: Clear to auscultation bilaterally Cardiac: Regular Rate and Rhythm Abdomen: Soft, non-tender, non-distended, no mass Skin: No rash, early gangrenous changes toes 2 and 3 right foot, left second toe dark at distal phalanx Extremity Pulses:  2+ radial, brachial, femoral, absent popliteal dorsalis pedis, posterior tibial pulses bilaterally Musculoskeletal: No deformity or edema  Neurologic: Upper and lower extremity  motor 5/5 and symmetric  DATA:  The Nicole [name] had bilateral ABIs today. I reviewed and interpreted this study. She calcified vessels with unreliable ABIs.   ASSESSMENT:  Severe bilateral lower extremity peripheral arterial disease now with gangrenous toes right foot at risk for limb loss   PLAN:  Aortogram with bilateral lower extremity runoff on 06/20/2013. Possible intervention at that point. Nicole [name] was informed of the risks benefits possible complications and procedure details of arteriography and possible intervention. She wished to proceed.  Ruta Hinds, MD [facility] [place] Office: [phone] Pager: [phone]

## 2013-06-14 NOTE — Telephone Encounter (Signed)
[facility] 

## 2013-06-19 ENCOUNTER — Encounter (HOSPITAL_COMMUNITY): Payer: Self-pay

## 2013-06-19 MED ORDER — SODIUM CHLORIDE 0.9 % IJ SOLN: 3.0000 mL | Freq: Two times a day (BID) | INTRAMUSCULAR | Status: DC

## 2013-06-19 MED ORDER — SODIUM CHLORIDE 0.9 % IJ SOLN: 3.0000 mL | INTRAMUSCULAR | Status: DC | PRN

## 2013-06-19 MED ORDER — SODIUM CHLORIDE 0.9 % IV SOLN
250.0000 mL | INTRAVENOUS | Status: DC | PRN
  Administered 2013-06-20 (×2): via INTRAVENOUS

## 2013-06-20 ENCOUNTER — Telehealth: Payer: Self-pay

## 2013-06-20 ENCOUNTER — Encounter (HOSPITAL_COMMUNITY)
Admission: RE | Disposition: A | Discharge status: Skilled Nursing Facility | Payer: Self-pay | Source: Ambulatory Visit | Attending: Vascular Surgery

## 2013-06-20 ENCOUNTER — Inpatient Hospital Stay (HOSPITAL_COMMUNITY)
Admission: RE | Admit: 2013-06-20 | Discharge: 2013-06-21 | DRG: 252 | Disposition: A | Discharge status: Skilled Nursing Facility | Payer: Medicare Other | Source: Ambulatory Visit | Attending: Vascular Surgery | Admitting: Vascular Surgery

## 2013-06-20 ENCOUNTER — Encounter (HOSPITAL_COMMUNITY): Payer: Self-pay | Admitting: Certified Registered"

## 2013-06-20 ENCOUNTER — Encounter (HOSPITAL_COMMUNITY): Payer: Medicare Other | Admitting: Certified Registered"

## 2013-06-20 ENCOUNTER — Ambulatory Visit (HOSPITAL_COMMUNITY): Payer: Medicare Other | Admitting: Certified Registered"

## 2013-06-20 DIAGNOSIS — I70269 Atherosclerosis of native arteries of extremities with gangrene, unspecified extremity: Principal | ICD-10-CM | POA: Diagnosis present

## 2013-06-20 DIAGNOSIS — I743 Embolism and thrombosis of arteries of the lower extremities: Secondary | ICD-10-CM | POA: Diagnosis present

## 2013-06-20 DIAGNOSIS — M79609 Pain in unspecified limb: Secondary | ICD-10-CM

## 2013-06-20 DIAGNOSIS — L97809 Non-pressure chronic ulcer of other part of unspecified lower leg with unspecified severity: Secondary | ICD-10-CM | POA: Diagnosis present

## 2013-06-20 DIAGNOSIS — N2581 Secondary hyperparathyroidism of renal origin: Secondary | ICD-10-CM | POA: Diagnosis present

## 2013-06-20 DIAGNOSIS — I739 Peripheral vascular disease, unspecified: Secondary | ICD-10-CM | POA: Diagnosis present

## 2013-06-20 DIAGNOSIS — M899 Disorder of bone, unspecified: Secondary | ICD-10-CM | POA: Diagnosis present

## 2013-06-20 DIAGNOSIS — Z79899 Other long term (current) drug therapy: Secondary | ICD-10-CM

## 2013-06-20 DIAGNOSIS — T82898A Other specified complication of vascular prosthetic devices, implants and grafts, initial encounter: Secondary | ICD-10-CM

## 2013-06-20 DIAGNOSIS — I120 Hypertensive chronic kidney disease with stage 5 chronic kidney disease or end stage renal disease: Secondary | ICD-10-CM | POA: Diagnosis present

## 2013-06-20 DIAGNOSIS — M949 Disorder of cartilage, unspecified: Secondary | ICD-10-CM

## 2013-06-20 DIAGNOSIS — Z86711 Personal history of pulmonary embolism: Secondary | ICD-10-CM

## 2013-06-20 DIAGNOSIS — N186 End stage renal disease: Secondary | ICD-10-CM | POA: Diagnosis present

## 2013-06-20 DIAGNOSIS — F17200 Nicotine dependence, unspecified, uncomplicated: Secondary | ICD-10-CM | POA: Diagnosis present

## 2013-06-20 DIAGNOSIS — E039 Hypothyroidism, unspecified: Secondary | ICD-10-CM | POA: Diagnosis present

## 2013-06-20 DIAGNOSIS — Z992 Dependence on renal dialysis: Secondary | ICD-10-CM

## 2013-06-20 DIAGNOSIS — G8929 Other chronic pain: Secondary | ICD-10-CM | POA: Diagnosis present

## 2013-06-20 HISTORY — PX: ABDOMINAL AORTAGRAM: SHX5454

## 2013-06-20 HISTORY — PX: THROMBECTOMY FEMORAL ARTERY: SHX6406

## 2013-06-20 LAB — POCT I-STAT, CHEM 8
BUN: 36 mg/dL — ABNORMAL HIGH (ref 6–23)
CREATININE: 6.6 mg/dL — AB (ref 0.50–1.10)
Calcium, Ion: 1.18 mmol/L (ref 1.13–1.30)
Chloride: 98 mEq/L (ref 96–112)
GLUCOSE: 101 mg/dL — AB (ref 70–99)
HCT: 42 % (ref 36.0–46.0)
Hemoglobin: 14.3 g/dL (ref 12.0–15.0)
POTASSIUM: 3.8 meq/L (ref 3.7–5.3)
Sodium: 140 mEq/L (ref 137–147)
TCO2: 27 mmol/L (ref 0–100)

## 2013-06-20 LAB — MRSA PCR SCREENING: MRSA BY PCR: NEGATIVE

## 2013-06-20 LAB — GLUCOSE, CAPILLARY: GLUCOSE-CAPILLARY: 138 mg/dL — AB (ref 70–99)

## 2013-06-20 SURGERY — THROMBECTOMY, ARTERY, FEMORAL
Anesthesia: General | Site: Leg Upper | Laterality: Left

## 2013-06-20 SURGERY — ABDOMINAL AORTAGRAM
Anesthesia: LOCAL

## 2013-06-20 MED ORDER — LIDOCAINE HCL (PF) 1 % IJ SOLN
INTRAMUSCULAR | Status: AC
  Filled 2013-06-20: qty 30

## 2013-06-20 MED ORDER — CERTA-VITE PO LIQD
5.0000 mL | Freq: Every day | ORAL | Status: DC
  Administered 2013-06-21: 5 mL via ORAL
  Filled 2013-06-20: qty 5

## 2013-06-20 MED ORDER — LEVOTHYROXINE SODIUM 100 MCG PO TABS
100.0000 ug | ORAL_TABLET | Freq: Every day | ORAL | Status: DC
Start: 2013-06-21 — End: 2013-06-21
  Administered 2013-06-21: 100 ug via ORAL
  Filled 2013-06-20 (×2): qty 1

## 2013-06-20 MED ORDER — NEOSTIGMINE METHYLSULFATE 1 MG/ML IJ SOLN
INTRAMUSCULAR | Status: DC | PRN
  Administered 2013-06-20: 5 mg via INTRAVENOUS

## 2013-06-20 MED ORDER — SODIUM CHLORIDE 0.9 % IR SOLN
Status: DC | PRN
  Administered 2013-06-20: 18:00:00

## 2013-06-20 MED ORDER — EPHEDRINE SULFATE 50 MG/ML IJ SOLN
INTRAMUSCULAR | Status: DC | PRN
  Administered 2013-06-20: 10 mg via INTRAVENOUS

## 2013-06-20 MED ORDER — LIDOCAINE HCL (CARDIAC) 20 MG/ML IV SOLN
INTRAVENOUS | Status: AC
  Filled 2013-06-20: qty 5

## 2013-06-20 MED ORDER — OXYCODONE HCL 5 MG PO TABS: 5.0000 mg | ORAL_TABLET | ORAL | Status: DC | PRN

## 2013-06-20 MED ORDER — GLYCOPYRROLATE 0.2 MG/ML IJ SOLN
INTRAMUSCULAR | Status: DC | PRN
  Administered 2013-06-20: 0.6 mg via INTRAVENOUS

## 2013-06-20 MED ORDER — PANTOPRAZOLE SODIUM 40 MG PO TBEC
40.0000 mg | DELAYED_RELEASE_TABLET | Freq: Every day | ORAL | Status: DC
  Administered 2013-06-21: 40 mg via ORAL
  Filled 2013-06-20: qty 1

## 2013-06-20 MED ORDER — POLYETHYLENE GLYCOL 3350 17 G PO PACK
17.0000 g | PACK | Freq: Every day | ORAL | Status: DC | PRN
  Filled 2013-06-20: qty 1

## 2013-06-20 MED ORDER — FENTANYL CITRATE 0.05 MG/ML IJ SOLN
INTRAMUSCULAR | Status: DC | PRN
  Administered 2013-06-20 (×7): 50 ug via INTRAVENOUS

## 2013-06-20 MED ORDER — TRAMADOL HCL 50 MG PO TABS
50.0000 mg | ORAL_TABLET | Freq: Three times a day (TID) | ORAL | Status: DC
  Administered 2013-06-21 (×2): 50 mg via ORAL
  Filled 2013-06-20 (×2): qty 1

## 2013-06-20 MED ORDER — 0.9 % SODIUM CHLORIDE (POUR BTL) OPTIME
TOPICAL | Status: DC | PRN
  Administered 2013-06-20: 2000 mL

## 2013-06-20 MED ORDER — ONDANSETRON HCL 4 MG/2ML IJ SOLN
INTRAMUSCULAR | Status: DC | PRN
  Administered 2013-06-20: 4 mg via INTRAVENOUS

## 2013-06-20 MED ORDER — PROMETHAZINE HCL 25 MG/ML IJ SOLN: 6.2500 mg | INTRAMUSCULAR | Status: DC | PRN

## 2013-06-20 MED ORDER — CALCIUM ACETATE 667 MG PO CAPS
667.0000 mg | ORAL_CAPSULE | ORAL | Status: DC
  Administered 2013-06-21: 667 mg via ORAL
  Filled 2013-06-20 (×3): qty 1

## 2013-06-20 MED ORDER — OXYCODONE HCL 5 MG/5ML PO SOLN: 5.0000 mg | Freq: Once | ORAL | Status: DC | PRN

## 2013-06-20 MED ORDER — METOPROLOL TARTRATE 1 MG/ML IV SOLN: 2.0000 mg | INTRAVENOUS | Status: DC | PRN

## 2013-06-20 MED ORDER — HYDRALAZINE HCL 20 MG/ML IJ SOLN: 10.0000 mg | INTRAMUSCULAR | Status: DC | PRN

## 2013-06-20 MED ORDER — HEPARIN (PORCINE) IN NACL 2-0.9 UNIT/ML-% IJ SOLN
INTRAMUSCULAR | Status: AC
  Filled 2013-06-20: qty 1000

## 2013-06-20 MED ORDER — HYDROMORPHONE HCL PF 1 MG/ML IJ SOLN: 0.2500 mg | INTRAMUSCULAR | Status: DC | PRN

## 2013-06-20 MED ORDER — PROPOFOL 10 MG/ML IV BOLUS
INTRAVENOUS | Status: AC
  Filled 2013-06-20: qty 20

## 2013-06-20 MED ORDER — DOCUSATE SODIUM 100 MG PO CAPS: 100.0000 mg | ORAL_CAPSULE | Freq: Two times a day (BID) | ORAL | Status: DC | PRN

## 2013-06-20 MED ORDER — MIRTAZAPINE 15 MG PO TABS
15.0000 mg | ORAL_TABLET | Freq: Every day | ORAL | Status: DC
  Administered 2013-06-21: 15 mg via ORAL
  Filled 2013-06-20 (×2): qty 1

## 2013-06-20 MED ORDER — LISINOPRIL 40 MG PO TABS
40.0000 mg | ORAL_TABLET | Freq: Every day | ORAL | Status: DC
  Administered 2013-06-21: 40 mg via ORAL
  Filled 2013-06-20 (×2): qty 1

## 2013-06-20 MED ORDER — GUAIFENESIN-DM 100-10 MG/5ML PO SYRP: 15.0000 mL | ORAL_SOLUTION | ORAL | Status: DC | PRN

## 2013-06-20 MED ORDER — DOXYCYCLINE HYCLATE 100 MG PO TABS
100.0000 mg | ORAL_TABLET | Freq: Two times a day (BID) | ORAL | Status: DC
  Administered 2013-06-21 (×2): 100 mg via ORAL
  Filled 2013-06-20 (×3): qty 1

## 2013-06-20 MED ORDER — MORPHINE SULFATE 10 MG/ML IJ SOLN: 2.0000 mg | INTRAMUSCULAR | Status: DC | PRN

## 2013-06-20 MED ORDER — ACETAMINOPHEN 325 MG PO TABS: 650.0000 mg | ORAL_TABLET | ORAL | Status: DC | PRN

## 2013-06-20 MED ORDER — PHENYLEPHRINE HCL 10 MG/ML IJ SOLN
INTRAMUSCULAR | Status: DC | PRN
  Administered 2013-06-20: 80 ug via INTRAVENOUS

## 2013-06-20 MED ORDER — HYDROCODONE-ACETAMINOPHEN 10-325 MG PO TABS: 1.0000 | ORAL_TABLET | Freq: Four times a day (QID) | ORAL | Status: DC | PRN

## 2013-06-20 MED ORDER — MORPHINE SULFATE 2 MG/ML IJ SOLN
2.0000 mg | INTRAMUSCULAR | Status: DC | PRN
  Administered 2013-06-20 – 2013-06-21 (×2): 2 mg via INTRAVENOUS
  Filled 2013-06-20 (×3): qty 1

## 2013-06-20 MED ORDER — LABETALOL HCL 5 MG/ML IV SOLN: 10.0000 mg | INTRAVENOUS | Status: DC | PRN

## 2013-06-20 MED ORDER — AMLODIPINE BESYLATE 10 MG PO TABS
10.0000 mg | ORAL_TABLET | Freq: Every day | ORAL | Status: DC
  Administered 2013-06-21: 10 mg via ORAL
  Filled 2013-06-20 (×2): qty 1

## 2013-06-20 MED ORDER — MORPHINE SULFATE 2 MG/ML IJ SOLN
INTRAMUSCULAR | Status: AC
  Administered 2013-06-20: 2 mg via INTRAVENOUS
  Filled 2013-06-20: qty 1

## 2013-06-20 MED ORDER — PROPOFOL 10 MG/ML IV BOLUS
INTRAVENOUS | Status: DC | PRN
  Administered 2013-06-20: 20 mg via INTRAVENOUS
  Administered 2013-06-20: 130 mg via INTRAVENOUS

## 2013-06-20 MED ORDER — DEXTROSE 5 % IV SOLN
INTRAVENOUS | Status: DC | PRN
  Administered 2013-06-20: 18:00:00 via INTRAVENOUS

## 2013-06-20 MED ORDER — ONDANSETRON HCL 4 MG/2ML IJ SOLN: 4.0000 mg | Freq: Four times a day (QID) | INTRAMUSCULAR | Status: DC | PRN

## 2013-06-20 MED ORDER — ACETAMINOPHEN 325 MG PO TABS
325.0000 mg | ORAL_TABLET | ORAL | Status: DC | PRN
  Administered 2013-06-20: 650 mg via ORAL
  Filled 2013-06-20: qty 2

## 2013-06-20 MED ORDER — HEPARIN SODIUM (PORCINE) 1000 UNIT/ML IJ SOLN
INTRAMUSCULAR | Status: DC | PRN
  Administered 2013-06-20: 5000 [IU] via INTRAVENOUS

## 2013-06-20 MED ORDER — ARTIFICIAL TEARS OP OINT
TOPICAL_OINTMENT | OPHTHALMIC | Status: AC
  Filled 2013-06-20: qty 3.5

## 2013-06-20 MED ORDER — OXYCODONE HCL 5 MG PO TABS: 5.0000 mg | ORAL_TABLET | Freq: Once | ORAL | Status: DC | PRN

## 2013-06-20 MED ORDER — ACETAMINOPHEN 325 MG PO TABS
ORAL_TABLET | ORAL | Status: AC
  Filled 2013-06-20: qty 2

## 2013-06-20 MED ORDER — LIDOCAINE HCL (CARDIAC) 20 MG/ML IV SOLN
INTRAVENOUS | Status: DC | PRN
  Administered 2013-06-20: 30 mg via INTRAVENOUS
  Administered 2013-06-20: 70 mg via INTRAVENOUS

## 2013-06-20 MED ORDER — DOCUSATE SODIUM 100 MG PO CAPS
100.0000 mg | ORAL_CAPSULE | Freq: Every day | ORAL | Status: DC
  Administered 2013-06-21: 100 mg via ORAL
  Filled 2013-06-20: qty 1

## 2013-06-20 MED ORDER — ONDANSETRON HCL 4 MG/2ML IJ SOLN
4.0000 mg | Freq: Four times a day (QID) | INTRAMUSCULAR | Status: DC | PRN
  Administered 2013-06-20: 4 mg via INTRAVENOUS
  Filled 2013-06-20: qty 2

## 2013-06-20 MED ORDER — ONDANSETRON HCL 4 MG/2ML IJ SOLN
INTRAMUSCULAR | Status: AC
  Filled 2013-06-20: qty 2

## 2013-06-20 MED ORDER — HYDRALAZINE HCL 25 MG PO TABS
25.0000 mg | ORAL_TABLET | ORAL | Status: DC
  Filled 2013-06-20 (×2): qty 1

## 2013-06-20 MED ORDER — ROCURONIUM BROMIDE 50 MG/5ML IV SOLN
INTRAVENOUS | Status: AC
  Filled 2013-06-20: qty 1

## 2013-06-20 MED ORDER — ACETAMINOPHEN 325 MG RE SUPP
325.0000 mg | RECTAL | Status: DC | PRN
  Filled 2013-06-20: qty 2

## 2013-06-20 MED ORDER — SUCCINYLCHOLINE CHLORIDE 20 MG/ML IJ SOLN
INTRAMUSCULAR | Status: DC | PRN
  Administered 2013-06-20: 65 mg via INTRAVENOUS

## 2013-06-20 MED ORDER — CEFAZOLIN SODIUM-DEXTROSE 2-3 GM-% IV SOLR
INTRAVENOUS | Status: DC | PRN
  Administered 2013-06-20: 2 g via INTRAVENOUS

## 2013-06-20 MED ORDER — FENTANYL CITRATE 0.05 MG/ML IJ SOLN
INTRAMUSCULAR | Status: AC
  Filled 2013-06-20: qty 5

## 2013-06-20 MED ORDER — PHENOL 1.4 % MT LIQD: 1.0000 | OROMUCOSAL | Status: DC | PRN

## 2013-06-20 MED ORDER — ROCURONIUM BROMIDE 100 MG/10ML IV SOLN
INTRAVENOUS | Status: DC | PRN
  Administered 2013-06-20 (×2): 10 mg via INTRAVENOUS
  Administered 2013-06-20: 25 mg via INTRAVENOUS

## 2013-06-20 MED ORDER — CALCIUM ACETATE 667 MG PO CAPS
2001.0000 mg | ORAL_CAPSULE | Freq: Three times a day (TID) | ORAL | Status: DC
  Filled 2013-06-20 (×4): qty 3

## 2013-06-20 MED ORDER — HEPARIN SODIUM (PORCINE) 5000 UNIT/ML IJ SOLN
5000.0000 [IU] | Freq: Three times a day (TID) | INTRAMUSCULAR | Status: DC
  Filled 2013-06-20 (×5): qty 1

## 2013-06-20 MED ORDER — DEXAMETHASONE SODIUM PHOSPHATE 4 MG/ML IJ SOLN
INTRAMUSCULAR | Status: DC | PRN
  Administered 2013-06-20: 4 mg via INTRAVENOUS

## 2013-06-20 SURGICAL SUPPLY — 53 items
ADH SKN CLS APL DERMABOND .7 (GAUZE/BANDAGES/DRESSINGS) ×2
ARMBAND PINK RESTRICT EXTREMIT (MISCELLANEOUS) IMPLANT
CANISTER SUCTION 2500CC (MISCELLANEOUS) ×3 IMPLANT
CANNULA VESSEL 3MM 2 BLNT TIP (CANNULA) ×3 IMPLANT
CATH EMB 3FR 80CM (CATHETERS) ×2 IMPLANT
CATH EMB 4FR 80CM (CATHETERS) ×3 IMPLANT
CATH EMB 5FR 80CM (CATHETERS) ×2 IMPLANT
CLIP TI MEDIUM 6 (CLIP) ×3 IMPLANT
CLIP TI WIDE RED SMALL 6 (CLIP) ×3 IMPLANT
COVER SURGICAL LIGHT HANDLE (MISCELLANEOUS) ×3 IMPLANT
DECANTER SPIKE VIAL GLASS SM (MISCELLANEOUS) ×1 IMPLANT
DERMABOND ADVANCED (GAUZE/BANDAGES/DRESSINGS) ×1
DERMABOND ADVANCED .7 DNX12 (GAUZE/BANDAGES/DRESSINGS) ×2 IMPLANT
DRAPE INCISE IOBAN 66X45 STRL (DRAPES) ×3 IMPLANT
DRAPE X-RAY CASS 24X20 (DRAPES) IMPLANT
ELECT REM PT RETURN 9FT ADLT (ELECTROSURGICAL) ×3
ELECTRODE REM PT RTRN 9FT ADLT (ELECTROSURGICAL) ×2 IMPLANT
GEL ULTRASOUND 20GR AQUASONIC (MISCELLANEOUS) IMPLANT
GLOVE BIO SURGEON STRL SZ7.5 (GLOVE) ×5 IMPLANT
GLOVE BIOGEL PI IND STRL 6.5 (GLOVE) ×1 IMPLANT
GLOVE BIOGEL PI INDICATOR 6.5 (GLOVE) ×1
GLOVE ECLIPSE 6.5 STRL STRAW (GLOVE) ×3 IMPLANT
GLOVE SURG SS PI 7.5 STRL IVOR (GLOVE) ×2 IMPLANT
GOWN STRL NON-REIN LRG LVL3 (GOWN DISPOSABLE) ×2 IMPLANT
GOWN STRL REUS W/ TWL LRG LVL3 (GOWN DISPOSABLE) ×6 IMPLANT
GOWN STRL REUS W/TWL LRG LVL3 (GOWN DISPOSABLE) ×9
KIT BASIN OR (CUSTOM PROCEDURE TRAY) ×3 IMPLANT
KIT ROOM TURNOVER OR (KITS) ×3 IMPLANT
LOOP VESSEL MINI RED (MISCELLANEOUS) IMPLANT
NS IRRIG 1000ML POUR BTL (IV SOLUTION) ×5 IMPLANT
PACK PERIPHERAL VASCULAR (CUSTOM PROCEDURE TRAY) ×2 IMPLANT
PAD ARMBOARD 7.5X6 YLW CONV (MISCELLANEOUS) ×6 IMPLANT
SET COLLECT BLD 21X3/4 12 (NEEDLE) IMPLANT
SPONGE INTESTINAL PEANUT (DISPOSABLE) ×2 IMPLANT
SPONGE SURGIFOAM ABS GEL 100 (HEMOSTASIS) IMPLANT
STOPCOCK 4 WAY LG BORE MALE ST (IV SETS) IMPLANT
SUT ETHILON 3 0 PS 1 (SUTURE) ×2 IMPLANT
SUT PROLENE 5 0 C 1 24 (SUTURE) ×2 IMPLANT
SUT PROLENE 5 0 C 1 36 (SUTURE) ×6 IMPLANT
SUT PROLENE 6 0 BV (SUTURE) ×10 IMPLANT
SUT PROLENE 6 0 CC (SUTURE) ×12 IMPLANT
SUT VIC AB 2-0 CT1 27 (SUTURE) ×3
SUT VIC AB 2-0 CT1 TAPERPNT 27 (SUTURE) ×1 IMPLANT
SUT VIC AB 3-0 SH 27 (SUTURE) ×6
SUT VIC AB 3-0 SH 27X BRD (SUTURE) ×3 IMPLANT
SUT VICRYL 4-0 PS2 18IN ABS (SUTURE) ×5 IMPLANT
SYR 3ML LL SCALE MARK (SYRINGE) ×9 IMPLANT
TOWEL OR 17X24 6PK STRL BLUE (TOWEL DISPOSABLE) ×3 IMPLANT
TOWEL OR 17X26 10 PK STRL BLUE (TOWEL DISPOSABLE) ×3 IMPLANT
TUBING EXTENTION W/L.L. (IV SETS) IMPLANT
UNDERPAD 30X30 INCONTINENT (UNDERPADS AND DIAPERS) ×3 IMPLANT
WATER STERILE IRR 1000ML POUR (IV SOLUTION) ×3 IMPLANT
YANKAUER SUCT BULB TIP NO VENT (SUCTIONS) ×2 IMPLANT

## 2013-06-20 SURGICAL SUPPLY — 55 items
ADH SKN CLS APL DERMABOND .7 (GAUZE/BANDAGES/DRESSINGS) ×1
BANDAGE ELASTIC 4 VELCRO ST LF (GAUZE/BANDAGES/DRESSINGS) IMPLANT
BANDAGE ESMARK 6X9 LF (GAUZE/BANDAGES/DRESSINGS) IMPLANT
BNDG CMPR 9X6 STRL LF SNTH (GAUZE/BANDAGES/DRESSINGS)
BNDG ESMARK 6X9 LF (GAUZE/BANDAGES/DRESSINGS)
CANISTER SUCTION 2500CC (MISCELLANEOUS) ×2 IMPLANT
CLIP TI MEDIUM 24 (CLIP) ×2 IMPLANT
CLIP TI WIDE RED SMALL 24 (CLIP) ×2 IMPLANT
COVER SURGICAL LIGHT HANDLE (MISCELLANEOUS) ×2 IMPLANT
CUFF TOURNIQUET SINGLE 24IN (TOURNIQUET CUFF) IMPLANT
CUFF TOURNIQUET SINGLE 34IN LL (TOURNIQUET CUFF) IMPLANT
CUFF TOURNIQUET SINGLE 44IN (TOURNIQUET CUFF) IMPLANT
DERMABOND ADVANCED (GAUZE/BANDAGES/DRESSINGS) ×1
DERMABOND ADVANCED .7 DNX12 (GAUZE/BANDAGES/DRESSINGS) ×1 IMPLANT
DRAIN CHANNEL 15F RND FF W/TCR (WOUND CARE) IMPLANT
DRAPE WARM FLUID 44X44 (DRAPE) ×2 IMPLANT
DRAPE X-RAY CASS 24X20 (DRAPES) IMPLANT
DRSG COVADERM 4X10 (GAUZE/BANDAGES/DRESSINGS) IMPLANT
DRSG COVADERM 4X8 (GAUZE/BANDAGES/DRESSINGS) IMPLANT
ELECT REM PT RETURN 9FT ADLT (ELECTROSURGICAL) ×2
ELECTRODE REM PT RTRN 9FT ADLT (ELECTROSURGICAL) ×1 IMPLANT
EVACUATOR SILICONE 100CC (DRAIN) IMPLANT
GLOVE BIOGEL PI IND STRL 7.5 (GLOVE) ×1 IMPLANT
GLOVE BIOGEL PI INDICATOR 7.5 (GLOVE) ×1
GLOVE SURG SS PI 7.5 STRL IVOR (GLOVE) ×2 IMPLANT
GOWN PREVENTION PLUS XXLARGE (GOWN DISPOSABLE) ×2 IMPLANT
GOWN STRL NON-REIN LRG LVL3 (GOWN DISPOSABLE) ×6 IMPLANT
HEMOSTAT SNOW SURGICEL 2X4 (HEMOSTASIS) IMPLANT
KIT BASIN OR (CUSTOM PROCEDURE TRAY) ×2 IMPLANT
KIT ROOM TURNOVER OR (KITS) ×2 IMPLANT
MARKER GRAFT CORONARY BYPASS (MISCELLANEOUS) IMPLANT
NS IRRIG 1000ML POUR BTL (IV SOLUTION) ×4 IMPLANT
PACK PERIPHERAL VASCULAR (CUSTOM PROCEDURE TRAY) ×2 IMPLANT
PAD ARMBOARD 7.5X6 YLW CONV (MISCELLANEOUS) ×4 IMPLANT
PADDING CAST COTTON 6X4 STRL (CAST SUPPLIES) IMPLANT
SET COLLECT BLD 21X3/4 12 (NEEDLE) IMPLANT
STOPCOCK 4 WAY LG BORE MALE ST (IV SETS) IMPLANT
SUT ETHILON 3 0 PS 1 (SUTURE) IMPLANT
SUT PROLENE 5 0 C 1 24 (SUTURE) ×2 IMPLANT
SUT PROLENE 6 0 BV (SUTURE) ×2 IMPLANT
SUT PROLENE 7 0 BV 1 (SUTURE) IMPLANT
SUT SILK 2 0 SH (SUTURE) ×2 IMPLANT
SUT SILK 3 0 (SUTURE)
SUT SILK 3-0 18XBRD TIE 12 (SUTURE) IMPLANT
SUT VIC AB 2-0 CT1 27 (SUTURE) ×4
SUT VIC AB 2-0 CT1 TAPERPNT 27 (SUTURE) ×2 IMPLANT
SUT VIC AB 3-0 SH 27 (SUTURE) ×4
SUT VIC AB 3-0 SH 27X BRD (SUTURE) ×2 IMPLANT
SUT VICRYL 4-0 PS2 18IN ABS (SUTURE) ×4 IMPLANT
TOWEL OR 17X24 6PK STRL BLUE (TOWEL DISPOSABLE) ×4 IMPLANT
TOWEL OR 17X26 10 PK STRL BLUE (TOWEL DISPOSABLE) ×4 IMPLANT
TRAY FOLEY CATH 16FRSI W/METER (SET/KITS/TRAYS/PACK) ×2 IMPLANT
TUBING EXTENTION W/L.L. (IV SETS) IMPLANT
UNDERPAD 30X30 INCONTINENT (UNDERPADS AND DIAPERS) ×2 IMPLANT
WATER STERILE IRR 1000ML POUR (IV SOLUTION) ×2 IMPLANT

## 2013-06-20 NOTE — Progress Notes (Signed)
Pt noted to have no doppler flow in left foot on arrival to short stay after leaving holding area.  She is difficult to examine due to her combativeness.    She has an aortobifem bypass.  She had a fairly straight forward stick but was moving around significantly during and after the exam.  She has doppler flow in the left groin but it is difficult to see is she has a femoral pulse due to her agitation.  Will have vascular lab duplex her left groin to make sure the aortobifem is patent.  Ruta Hinds, MD [facility] [place] Office: [phone] Pager: [phone]

## 2013-06-20 NOTE — Progress Notes (Addendum)
Pt noted to have occlusion of left limb of aortobifem.  Will need to go to OR emergently for thrombectomy.  Procedure risk benefit discussed with pt.  Pt son Richardson Landry notified.   Ruta Hinds, MD [facility] [place] Office: [phone] Pager: [phone]

## 2013-06-20 NOTE — Op Note (Signed)
Procedure: Thrombectomy left femoral artery  Preoperative diagnosis: Ischemia left foot  Postoperative diagnosis: Same  Anesthesia: Gen.  Assistant: Gaye Alken RNFA  Operative findings: #1 chronically occluded left superficial femoral artery #2 small profunda femoris artery  Indications: Patient is a 80 year old female who earlier today underwent an aortogram via a left femoral puncture. She was noted to have lost Doppler signals in her left foot post procedure. She had occlusion of the left limb of a pre-existing aortobifemoral bypass graft.  Operative details: After obtaining informed consent, the patient was taken to the operating room. The patient was placed in supine position on the operating room table. After induction of general anesthesia and endotracheal intubation, the patient's entire left lower extremity was prepped and draped in the usual sterile fashion. Next a longitudinal incision was made her pre-existing scar in the left groin. The incision was carried down through the subcutaneous tissues down to level of the pre-existing aortobifemoral bypass graft. The graft material was fairly degenerated but intact. There was dense scar tissue surrounding the femoral artery. During the course of dissection the adventitia of the superficial femoral and profunda femoris artery was slightly torn. This was repaired with several 6-0 Prolene sutures. The profunda femoris artery and superficial femoral arteries were dissected free circumferentially and vessel loops placed around these. The common femoral artery as well as the distal anastomoses were dissected free circumferentially initially as a unit. There was some bleeding from a medial circumflex iliac branch which was controlled with a 5-0 Prolene. The patient was given 5000 units of intravenous heparin. After 2 minutes of circulation time, a longitudinal opening was made in the hood of the left limb of the graft. There was fresh thrombus  within the artery. This was removed under direct vision. Next a #4 Fogarty catheter was used to thrombectomize the graft proximally. Several passes were made and a large amount thrombus was removed. There was then excellent arterial inflow which was pulsatile in nature. 2 clean passes were made with the Fogarty. The graft was clamped proximally with a Cooley clamp. I then attempted to pass a Fogarty catheter down the proximal superficial femoral and profunda femoris arteries. However the Fogarty would not pass easily. Due to the position of these arteries and their small size it was not easy to pass the Fogarty I thought was going to injure them. Therefore the graftotomy was reapproximated using a running 5-0 Prolene suture. The graft was then unclamped. At this point there were still no flow in the profunda were superficial femoral artery. I reclamped the graft. The profunda femoris artery was dissected free circumferentially for a few more centimeters. This was controlled with a fine bulldog clamp. The suture line was reopened. The arteriotomy was extended all the way down to the takeoff of the profundus and superficial femoral artery. On inspection the proximal lumen of the superficial femoral artery was essentially totally obliterated from chronic disease. I did not try to pass 30 down this. I was able to pass the Fogarty catheter down the profunda femoris artery. The orifice of the profunda was approximately 2 mm in diameter. I passed the #3 catheter down this several times. There was good backbleeding from the profunda this point. This was again controlled with a fine bulldog clamp. The the graft was then reapproximated again using a running 5-0 Prolene suture. The graft was then unclamped again. There was pulsatile flow in the profunda this point. There was faint flow in the superficial femoral artery. The proximal  profunda had biphasic flow and a palpable pulse. However, on inspection of the foot I still  would not have Doppler signals in the foot. The patient has known chronic lower extremity occlusive disease. Unfortunately we were unable to obtain lower extremity runoff use during her arteriogram earlier today. She is not a candidate for emergent lower extremity bypass. She is back to her pre-arteriogram state with flow into her profunda. Hopefully the Doppler signals were return over the course of the evening. At this point the wound was thoroughly irrigated with normal saline solution. The deep layer was closed with a running 3-0 Vicryl suture. The superficial layer closed with multiple layers of running 3-0 Vicryl suture. The skin was closed with a 4 Vicryl subcuticular stitch. The patient tolerated the procedure well and there were no complications. Instrument sponge and needle count was correct at the end of the case. The patient was taken to recovery in stable condition.  Ruta Hinds, MD [facility] [place] Office: [phone] Pager: [phone]

## 2013-06-20 NOTE — Discharge Instructions (Signed)
Arteriogram °Care After °These instructions give you information on caring for yourself after your procedure. Your doctor may also give you more specific instructions. Call your doctor if you have any problems or questions after your procedure. °HOME CARE °· Stay in bed the rest of the day. °· Keep your leg straight for at least 6 hours. °· Do not lift anything heavier than 10 pounds (about a gallon of milk) for 2 days. °· Do not walk a lot, run, or drive for 2 days. °· Return to normal activities in 2 days or as told by your doctor. °Finding out the results of your test °Ask when your test results will be ready. Make sure you get your test results. °GET HELP RIGHT AWAY IF:  °· You have fever of 102° F (38.9° C) or higher. °· You have more pain in your leg. °· The leg that was cut is: °· Bleeding. °· Puffy (swollen) or red. °· Cold. °· Pale or changes color. °· Weak. °· Tingly or numb. °If you go to the Emergency Room, tell your nurse that you have had an arteriogram. Take this paper with you to show the nurse. °MAKE SURE YOU: °· Understand these instructions. °· Will watch your condition. °· Will get help right away if you are not doing well or get worse. °Document Released: 05/21/2008 Document Revised: 05/17/2011 Document Reviewed: 05/21/2008 °ExitCare® Patient Information ©2014 [employer]. ° °

## 2013-06-20 NOTE — Anesthesia Preprocedure Evaluation (Addendum)
Anesthesia Evaluation  Patient identified by MRN, date of birth, ID band Patient awake    Reviewed: Allergy & Precautions, H&P , NPO status   Airway Mallampati: I TM Distance: >3 FB Neck ROM: Full    Dental  (+) Edentulous Upper, Edentulous Lower, Dental Advisory Given   Pulmonary former smoker,  breath sounds clear to auscultation        Cardiovascular hypertension, Pt. on medications + Peripheral Vascular Disease Rhythm:Regular Rate:Normal     Neuro/Psych  Neuromuscular disease    GI/Hepatic hiatal hernia,   Endo/Other  Hypothyroidism   Renal/GU Renal disease     Musculoskeletal   Abdominal   Peds  Hematology   Anesthesia Other Findings   Reproductive/Obstetrics                       Anesthesia Physical Anesthesia Plan  ASA: III and emergent  Anesthesia Plan: General   Post-op Pain Management:    Induction: Intravenous  Airway Management Planned: Oral ETT  Additional Equipment:   Intra-op Plan:   Post-operative Plan: Extubation in OR  Informed Consent: I have reviewed the patients History and Physical, chart, labs and discussed the procedure including the risks, benefits and alternatives for the proposed anesthesia with the patient or authorized representative who has indicated his/her understanding and acceptance.     Plan Discussed with:   Anesthesia Plan Comments:         Anesthesia Quick Evaluation

## 2013-06-20 NOTE — Transfer of Care (Signed)
Immediate Anesthesia Transfer of Care Note  Patient: Nicole [name]  Procedure(s) Performed: Procedure(s): THROMBECTOMY FEMORAL ARTERY (Left)  Patient Location: PACU  Anesthesia Type:General  Level of Consciousness: oriented, sedated, patient cooperative and responds to stimulation  Airway & Oxygen Therapy: Patient Spontanous Breathing and Patient connected to nasal cannula oxygen  Post-op Assessment: Report given to PACU RN, Post -op Vital signs reviewed and stable, Patient moving all extremities and Patient moving all extremities X 4  Post vital signs: Reviewed and stable  Complications: No apparent anesthesia complications

## 2013-06-20 NOTE — Anesthesia Postprocedure Evaluation (Signed)
Anesthesia Post Note  Patient: Nicole [name]  Procedure(s) Performed: Procedure(s) (LRB): THROMBECTOMY FEMORAL ARTERY (Left)  Anesthesia type: General  Patient location: PACU  Post pain: Pain level controlled  Post assessment: Patient's Cardiovascular Status Stable  Last Vitals:  Filed Vitals:   06/20/13 2143  BP: 134/54  Pulse: 71  Temp:   Resp: 13    Post vital signs: Reviewed and stable  Level of consciousness: alert  Complications: No apparent anesthesia complications

## 2013-06-20 NOTE — H&P (View-Only) (Signed)
[facility] [place] HISTORY AND PHYSICAL   History of Present Illness:  Patient is a 80 y.o. year old female who presents for evaluation of gangrene right foot. The patient states that the toes of her right foot had been slowly progressively worse over the last month. She states originally she had an ulcer from trimming her nails too closely. She has chronic pain in the right foot.  She had some type of "revascularization 14 years ago" she does not remember what this was. Her history states aortobifemoral bypass.Review of an old CT scan shows this is probably an aortobiiliac bypass.  She does not really describe claudication. However she is only minimally ambulatory. She is on chronic hemodialysis. Her dialysis today is Tuesday Thursday Saturday. She currently dialyzes from a right-sided catheter. She has had a failed right forearm graft. She does not describe any problems a left foot. She states that she does have a dark-colored toe on the left foot. Other medical problems include hypertension, anemia spinal stenosis prior pulmonary embolus, bradycardia. All these are currently controlled. She is currently smoking. Greater than 3 minutes they spent regarding smoking cessation counseling.  Past Medical History  Diagnosis Date  . Hypertension   . ESRD (end stage renal disease)   . Peripheral vascular disease     revascularization appx. 14 years ago  . Hypothyroidism   . Hiatal hernia   . Iron deficiency anemia   . Osteoarthritis   . Depression with anxiety   . Pulmonary embolism   . AAA (abdominal aortic aneurysm) 1999    aortic byfem bypass  . Anemia in chronic kidney disease(285.21)   . Spinal stenosis, lumbar region, without neurogenic claudication   . Other specified disease of white blood cells   . Hypopotassemia   . Other abnormal blood chemistry   . Macrocytosis   . Depression   . Anxiety   . Bradycardia   . H/O prolonged Q-T interval on ECG     Referral to EP     Past Surgical History  Procedure Laterality Date  . Abdominal surgery  about 14 years ago    REVASCULARIZATION  . Ateriovenous graft  04/18/2009    LEFT FOREARM avg  . Av fistula placement, radiocephalic  16/10/96  . Avgg removal  12/02/09    SECONDARY TO INFECTION LT FOREARM  . Av fistula placement, radiocephalic  0/4/54    NONMATURED  . Arteriovenous graft placement  07/24/10    RIGHT FOREARM LOOP  . Dg av dialysis graft declot or  12/05/10, 6/17/123, 09/24/11, 01/26/12    CLOTTED GRAFT  . Angioplasty  12/21/11    of RUE AVG    Social History History  Substance Use Topics  . Smoking status: Former Smoker -- 0.30 packs/day for 45 years    Quit date: 01/07/2012  . Smokeless tobacco: Not on file  . Alcohol Use: No    Family History No family history on file.  Allergies  No Known Allergies   Current Outpatient Prescriptions  Medication Sig Dispense Refill  . acetaminophen (TYLENOL) 325 MG tablet Take 650 mg by mouth every 4 (four) hours as needed.      Marland Kitchen amLODipine (NORVASC) 10 MG tablet Take 10 mg by mouth at bedtime.      . calcium acetate (PHOSLO) 667 MG capsule       . docusate sodium (COLACE) 100 MG capsule Take 100 mg by mouth 2 (two) times daily as needed for mild constipation.      Marland Kitchen  hydrALAZINE (APRESOLINE) 25 MG tablet Take 25 mg twice daily after dialysis days      . HYDROcodone-acetaminophen (NORCO) 10-325 MG per tablet Take one tablet by mouth every 6 hours as needed for pain      . levothyroxine (SYNTHROID, LEVOTHROID) 100 MCG tablet Take 100 mcg by mouth daily before breakfast.      . lisinopril (PRINIVIL,ZESTRIL) 40 MG tablet Take 40 mg by mouth at bedtime.      . mirtazapine (REMERON) 15 MG tablet Take 15 mg by mouth at bedtime.      . Multiple Vitamins-Minerals (CERTA-VITE PO) Take 1 tablet by mouth daily.      . polyethylene glycol (MIRALAX / GLYCOLAX) packet Take 17 g by mouth daily as needed for mild constipation or moderate constipation.      .  traMADol (ULTRAM) 50 MG tablet Take one tablet by mouth three times daily for pain  90 tablet  5   No current facility-administered medications for this visit.    ROS:   General:  No weight loss, Fever, chills  HEENT: No recent headaches, no nasal bleeding, no visual changes, no sore throat  Neurologic: No dizziness, blackouts, seizures. No recent symptoms of stroke or mini- stroke. No recent episodes of slurred speech, or temporary blindness.  Cardiac: No recent episodes of chest pain/pressure, no shortness of breath at rest.  No shortness of breath with exertion.  Denies history of atrial fibrillation or irregular heartbeat  Vascular: No history of rest pain in feet.  No history of claudication.  No history of non-healing ulcer, No history of DVT   Pulmonary: No home oxygen, no productive cough, no hemoptysis,  No asthma or wheezing  Musculoskeletal:  [ ]  Arthritis, [ ]  Low back pain,  [ ]  Joint pain  Hematologic:No history of hypercoagulable state.  No history of easy bleeding.  No history of anemia  Gastrointestinal: No hematochezia or melena,  No gastroesophageal reflux, no trouble swallowing  Urinary: [ ]  chronic Kidney disease, [ ]  on HD - [ ]  MWF or [ ]  TTHS, [ ]  Burning with urination, [ ]  Frequent urination, [ ]  Difficulty urinating;   Skin: No rashes  Psychological: No history of anxiety,  No history of depression   Physical Examination   General:  Alert and oriented, no acute distress, frail appearing HEENT: Normal Neck: No bruit or JVD Pulmonary: Clear to auscultation bilaterally Cardiac: Regular Rate and Rhythm Abdomen: Soft, non-tender, non-distended, no mass Skin: No rash, early gangrenous changes toes 2 and 3 right foot, left second toe dark at distal phalanx Extremity Pulses:  2+ radial, brachial, femoral, absent popliteal dorsalis pedis, posterior tibial pulses bilaterally Musculoskeletal: No deformity or edema  Neurologic: Upper and lower extremity  motor 5/5 and symmetric  DATA:  The patient had bilateral ABIs today. I reviewed and interpreted this study. She calcified vessels with unreliable ABIs.   ASSESSMENT:  Severe bilateral lower extremity peripheral arterial disease now with gangrenous toes right foot at risk for limb loss   PLAN:  Aortogram with bilateral lower extremity runoff on 06/20/2013. Possible intervention at that point. Patient was informed of the risks benefits possible complications and procedure details of arteriography and possible intervention. She wished to proceed.  Ruta Hinds, MD [facility] [place] Office: [phone] Pager: [phone]

## 2013-06-20 NOTE — Telephone Encounter (Signed)
Message copied by Denman George on Wed Jun 20, 2013  5:58 PM ------      Message from: Ruta Hinds E      Created: Wed Jun 20, 2013  2:02 PM       Abdominal aortogram with ultrasound      Pt unable to hold still for runoff and wished to end procedure.            She can follow up on an as needed basis if she wishes to have a right leg amputation.             She is not a candidate for further intervention otherwise and this was discussed with the patient.            Fields ------

## 2013-06-20 NOTE — Interval H&P Note (Signed)
History and Physical Interval Note:  06/20/2013 1:23 PM  Nicole [name]  has presented today for surgery, with the diagnosis of asc toe left foot  The various methods of treatment have been discussed with the patient and family. After consideration of risks, benefits and other options for treatment, the patient has consented to  Procedure(s): ABDOMINAL AORTAGRAM (N/A) as a surgical intervention .  The patient's history has been reviewed, patient examined, no change in status, stable for surgery.  I have reviewed the patient's chart and labs.  Questions were answered to the patient's satisfaction.     Elam Dutch

## 2013-06-20 NOTE — Progress Notes (Signed)
CLIENT C/O 10/10 LEFT FOOT PAIN AND PULSE COMES AND GOES LEFT POSTERIOR TIBIAL; DR FIELDS IN

## 2013-06-20 NOTE — Op Note (Signed)
Procedure: Abdominal aortogram  Preoperative diagnosis: Gangrene right foot  Postoperative diagnosis: Same  Anesthesia: Local  Operative findings: Patent aortobifemoral bypass  Operative details: After obtaining informed consent, the patient was brought to the [place] lab. The patient was placed in supine position the Angio table. Both groins were prepped and draped in usual sterile fashion. Local anesthesia was then threaded over the left common femoral artery. Ultrasound was used to identify the left common femoral artery. The patient had had a previous aortobifemoral bypass. The graft was visualized under ultrasound. The graft was directly cannulated. An 81 Versacore wire was then threaded up into the abdominal aorta under fluoroscopic guidance. A 5 French sheath was placed over the guidewire into the left common femoral artery. The sheath was thoroughly flushed with heparinized saline. A 5 French pigtail catheter was then placed over the guidewire into the abdominal aorta. An abdominal aortogram was obtained. There are single renal arteries bilaterally. These are patent. The infrarenal abdominal aorta is patent. There is an aortobifemoral bypass graft which begins just below the takeoff of the renal arteries. The internal iliac arteries are occluded bilaterally. The aortobifemoral bypass is widely patent. The common femoral arteries are patent. The profunda femoris artery is patent bilaterally. The full length of the superficial femoral artery is not identified on this image.  At this point the patient became quite agitated. She stated that she wished to have the procedure terminated at this point. She stated that she did not want to hold still any longer. She was unable to comply with lying flat for any images or holding still for them. I discussed with the patient the importance of obtaining these images for determining her lower extremity circulation. However, at this point she wished no further  procedures done. At this point the abdominal aortogram runoff views were aborted. The 5 French pigtail catheter was removed over a guidewire. The 5 French sheath was flushed with heparinized saline. Sheath was left in place to be pulled in the holding area.  Impression: Patent aortoiliac system. No visualization of lower extremity runoff due to patient being unable to complete exam.  Operative management: Since the patient will not consent to lower extremity runoff views, her only option would be for an amputation of her right leg if she has progressive gangrenous changes. The patient will followup on as-needed basis if she wishes to have an amputation at some point in the future.  Nicole Hinds, MD [facility] [place] Office: [phone] Pager: [phone]

## 2013-06-21 ENCOUNTER — Telehealth: Payer: Self-pay | Admitting: Vascular Surgery

## 2013-06-21 ENCOUNTER — Encounter (HOSPITAL_COMMUNITY): Payer: Self-pay | Admitting: Acute Care

## 2013-06-21 LAB — COMPREHENSIVE METABOLIC PANEL
ALBUMIN: 2.8 g/dL — AB (ref 3.5–5.2)
ALT: 6 U/L (ref 0–35)
AST: 15 U/L (ref 0–37)
Alkaline Phosphatase: 93 U/L (ref 39–117)
BUN: 44 mg/dL — ABNORMAL HIGH (ref 6–23)
CALCIUM: 9.2 mg/dL (ref 8.4–10.5)
CO2: 24 mEq/L (ref 19–32)
CREATININE: 7.28 mg/dL — AB (ref 0.50–1.10)
Chloride: 96 mEq/L (ref 96–112)
GFR calc Af Amer: 5 mL/min — ABNORMAL LOW (ref >90)
GFR calc non Af Amer: 5 mL/min — ABNORMAL LOW (ref >90)
Glucose, Bld: 147 mg/dL — ABNORMAL HIGH (ref 70–99)
Potassium: 3.9 mEq/L (ref 3.7–5.3)
SODIUM: 139 meq/L (ref 137–147)
Total Bilirubin: 0.2 mg/dL — ABNORMAL LOW (ref 0.3–1.2)
Total Protein: 7.1 g/dL (ref 6.0–8.3)

## 2013-06-21 LAB — CBC
HCT: 27.1 % — ABNORMAL LOW (ref 36.0–46.0)
HCT: 32.5 % — ABNORMAL LOW (ref 36.0–46.0)
Hemoglobin: 10.6 g/dL — ABNORMAL LOW (ref 12.0–15.0)
Hemoglobin: 8.7 g/dL — ABNORMAL LOW (ref 12.0–15.0)
MCH: 34.8 pg — ABNORMAL HIGH (ref 26.0–34.0)
MCH: 35 pg — AB (ref 26.0–34.0)
MCHC: 32.1 g/dL (ref 30.0–36.0)
MCHC: 32.6 g/dL (ref 30.0–36.0)
MCV: 107.3 fL — ABNORMAL HIGH (ref 78.0–100.0)
MCV: 108.4 fL — ABNORMAL HIGH (ref 78.0–100.0)
PLATELETS: 174 10*3/uL (ref 150–400)
Platelets: 157 10*3/uL (ref 150–400)
RBC: 2.5 MIL/uL — ABNORMAL LOW (ref 3.87–5.11)
RBC: 3.03 MIL/uL — ABNORMAL LOW (ref 3.87–5.11)
RDW: 16.3 % — AB (ref 11.5–15.5)
RDW: 16.1 % — AB (ref 11.5–15.5)
WBC: 10.9 10*3/uL — ABNORMAL HIGH (ref 4.0–10.5)
WBC: 9.8 10*3/uL (ref 4.0–10.5)

## 2013-06-21 LAB — GLUCOSE, CAPILLARY: GLUCOSE-CAPILLARY: 155 mg/dL — AB (ref 70–99)

## 2013-06-21 LAB — RENAL FUNCTION PANEL
ALBUMIN: 2.6 g/dL — AB (ref 3.5–5.2)
BUN: 50 mg/dL — ABNORMAL HIGH (ref 6–23)
CO2: 24 mEq/L (ref 19–32)
CREATININE: 8.54 mg/dL — AB (ref 0.50–1.10)
Calcium: 8.7 mg/dL (ref 8.4–10.5)
Chloride: 94 mEq/L — ABNORMAL LOW (ref 96–112)
GFR calc Af Amer: 5 mL/min — ABNORMAL LOW (ref >90)
GFR calc non Af Amer: 4 mL/min — ABNORMAL LOW (ref >90)
Glucose, Bld: 115 mg/dL — ABNORMAL HIGH (ref 70–99)
PHOSPHORUS: 7.1 mg/dL — AB (ref 2.3–4.6)
POTASSIUM: 4.9 meq/L (ref 3.7–5.3)
Sodium: 139 mEq/L (ref 137–147)

## 2013-06-21 LAB — PROTIME-INR
INR: 1.03 (ref 0.00–1.49)
Prothrombin Time: 13.3 seconds (ref 11.6–15.2)

## 2013-06-21 MED ORDER — SODIUM CHLORIDE 0.9 % IV SOLN: 100.0000 mL | INTRAVENOUS | Status: DC | PRN

## 2013-06-21 MED ORDER — LIDOCAINE HCL (PF) 1 % IJ SOLN: 5.0000 mL | INTRAMUSCULAR | Status: DC | PRN

## 2013-06-21 MED ORDER — PENTAFLUOROPROP-TETRAFLUOROETH EX AERO: 1.0000 "application " | INHALATION_SPRAY | CUTANEOUS | Status: DC | PRN

## 2013-06-21 MED ORDER — ALTEPLASE 2 MG IJ SOLR
2.0000 mg | Freq: Once | INTRAMUSCULAR | Status: DC | PRN
  Filled 2013-06-21: qty 2

## 2013-06-21 MED ORDER — HYDROCODONE-ACETAMINOPHEN 10-325 MG PO TABS: 1.0000 | ORAL_TABLET | Freq: Four times a day (QID) | ORAL | Status: DC | PRN

## 2013-06-21 MED ORDER — HEPARIN SODIUM (PORCINE) 1000 UNIT/ML DIALYSIS: 1000.0000 [IU] | INTRAMUSCULAR | Status: DC | PRN

## 2013-06-21 MED ORDER — HYDROCODONE-ACETAMINOPHEN 10-325 MG PO TABS
1.0000 | ORAL_TABLET | Freq: Four times a day (QID) | ORAL | Status: DC | PRN
Start: 2013-06-21 — End: 2013-06-21

## 2013-06-21 MED ORDER — NEPRO/CARBSTEADY PO LIQD
237.0000 mL | ORAL | Status: DC | PRN
  Filled 2013-06-21: qty 237

## 2013-06-21 MED ORDER — LIDOCAINE-PRILOCAINE 2.5-2.5 % EX CREA
1.0000 "application " | TOPICAL_CREAM | CUTANEOUS | Status: DC | PRN
  Filled 2013-06-21: qty 5

## 2013-06-21 NOTE — Progress Notes (Signed)
Pt d/c'd to [facility] via stretcher accompanied by EMS. Report called to nurse on [place] hall at [facility]. Pt's son contacted to pick up pt's wheelchair at [place], son plans to come get it this evening.

## 2013-06-21 NOTE — Procedures (Signed)
Pt seen on HD.  Ap 180 Vp 130.  BFR 444. SBP 115.  Tolerating HD well so far.

## 2013-06-21 NOTE — Consult Note (Signed)
[facility] Renal Consultation Note  Indication for Consultation:  Management of ESRD/hemodialysis; anemia, hypertension/volume and secondary hyperparathyroidism  HPI: Nicole [name] is a 80 y.o. [place] female resident of [facility] with a history of hypertension, PVD, and ESRD on dialysis on TTS at the [facility] who presented yesterday for abdominal aortogram per Dr. Oneida Alar of [facility] to evaluate pain secondary to worsening gangrenous changes of the right foot.  Her aortoiliac system was found to be patent, but the exam was incomplete due to patient agitation. However, she was noted to have an aortobifemoral graft which further Doppler studies revealed to have an occlusion at the left limb, requiring emergent thrombectomy.  Today her right foot pain has improved, and she is ready to return to the nursing home, but will require her regular scheduled dialysis prior to discharge.  Dialysis Orders:   TTS @ [place] 4 hrs    49 kg    2K/2.25Ca      400/A1.5       Heparin 6000 U      R IJ catheter  No Hectorol               Epogen 3600 U        No Venofer  Past Medical History  Diagnosis Date  . Hypertension   . ESRD (end stage renal disease)   . Peripheral vascular disease     revascularization appx. 14 years ago  . Hypothyroidism   . Hiatal hernia   . Iron deficiency anemia   . Osteoarthritis   . Depression with anxiety   . Pulmonary embolism   . AAA (abdominal aortic aneurysm) 1999    aortic byfem bypass  . Anemia in chronic kidney disease(285.21)   . Spinal stenosis, lumbar region, without neurogenic claudication   . Other specified disease of white blood cells   . Hypopotassemia   . Other abnormal blood chemistry   . Macrocytosis   . Depression   . Anxiety   . Bradycardia   . H/O prolonged Q-T interval on ECG     Referral to EP   Past Surgical History  Procedure Laterality Date  . Abdominal surgery  about 14 years ago    REVASCULARIZATION  .  Ateriovenous graft  04/18/2009    LEFT FOREARM avg  . Av fistula placement, radiocephalic  30/86/57  . Avgg removal  12/02/09    SECONDARY TO INFECTION LT FOREARM  . Av fistula placement, radiocephalic  10/10/67    NONMATURED  . Arteriovenous graft placement  07/24/10    RIGHT FOREARM LOOP  . Dg av dialysis graft declot or  12/05/10, 6/17/123, 09/24/11, 01/26/12    CLOTTED GRAFT  . Angioplasty  12/21/11    of RUE AVG   History reviewed. No pertinent family history.  Social History She quit smoking cigarettes when she moved into her nursing home 2-3 years ago and states that a pack usually lasted 2-3 days.  She denies any history of alcohol or illicit drug use.   No Known Allergies Prior to Admission medications   Medication Sig Start Date End Date Taking? Authorizing Provider  acetaminophen (TYLENOL) 325 MG tablet Take 650 mg by mouth every 4 (four) hours as needed for mild pain.    Yes Historical Provider, MD  amLODipine (NORVASC) 10 MG tablet Take 10 mg by mouth at bedtime.   Yes Historical Provider, MD  calcium acetate (PHOSLO) 667 MG capsule Take 2,001 mg by mouth 3 (three)  times daily with meals.  04/10/13  Yes Historical Provider, MD  calcium acetate (PHOSLO) 667 MG capsule Take 667 mg by mouth with snacks.   Yes Historical Provider, MD  docusate sodium (COLACE) 100 MG capsule Take 100 mg by mouth 2 (two) times daily as needed for mild constipation.   Yes Historical Provider, MD  doxycycline (VIBRA-TABS) 100 MG tablet Take 100 mg by mouth 2 (two) times daily. For 10 days started 06/13/13   Yes Historical Provider, MD  hydrALAZINE (APRESOLINE) 25 MG tablet Take 25 mg by mouth See admin instructions. Take 25 mg twice daily after dialysis days on Tuesday Thursday and Saturday   Yes Historical Provider, MD  levothyroxine (SYNTHROID, LEVOTHROID) 100 MCG tablet Take 100 mcg by mouth daily before breakfast.   Yes Historical Provider, MD  lisinopril (PRINIVIL,ZESTRIL) 40 MG tablet Take 40 mg by  mouth at bedtime.   Yes Historical Provider, MD  mirtazapine (REMERON) 15 MG tablet Take 15 mg by mouth at bedtime.   Yes Historical Provider, MD  Multiple Vitamins-Minerals (CERTA-VITE PO) Take 1 tablet by mouth daily.   Yes Historical Provider, MD  polyethylene glycol (MIRALAX / GLYCOLAX) packet Take 17 g by mouth daily as needed for mild constipation or moderate constipation.   Yes Historical Provider, MD  traMADol (ULTRAM) 50 MG tablet Take 50 mg by mouth 3 (three) times daily.   Yes Historical Provider, MD  HYDROcodone-acetaminophen (NORCO) 10-325 MG per tablet Take 1 tablet by mouth every 6 (six) hours as needed for moderate pain. 06/21/13   Gabriel Earing, PA-C   Labs:  Results for orders placed during the hospital encounter of 06/20/13 (from the past 48 hour(s))  POCT I-STAT, CHEM 8     Status: Abnormal   Collection Time    06/20/13  9:10 AM      Result Value Ref Range   Sodium 140  137 - 147 mEq/L   Potassium 3.8  3.7 - 5.3 mEq/L   Chloride 98  96 - 112 mEq/L   BUN 36 (*) 6 - 23 mg/dL   Creatinine, Ser 6.60 (*) 0.50 - 1.10 mg/dL   Glucose, Bld 101 (*) 70 - 99 mg/dL   Calcium, Ion 1.18  1.13 - 1.30 mmol/L   TCO2 27  0 - 100 mmol/L   Hemoglobin 14.3  12.0 - 15.0 g/dL   HCT 42.0  36.0 - 46.0 %  GLUCOSE, CAPILLARY     Status: Abnormal   Collection Time    06/20/13  9:01 PM      Result Value Ref Range   Glucose-Capillary 138 (*) 70 - 99 mg/dL  GLUCOSE, CAPILLARY     Status: Abnormal   Collection Time    06/20/13  9:58 PM      Result Value Ref Range   Glucose-Capillary 155 (*) 70 - 99 mg/dL   Comment 1 Notify RN     Comment 2 Documented in Chart    MRSA PCR SCREENING     Status: None   Collection Time    06/20/13 10:24 PM      Result Value Ref Range   MRSA by PCR NEGATIVE  NEGATIVE   Comment:            The GeneXpert MRSA Assay (FDA     approved for NASAL specimens     only), is one component of a     comprehensive MRSA colonization     surveillance program. It is not  intended to diagnose MRSA     infection nor to guide or     monitor treatment for     MRSA infections.  CBC     Status: Abnormal   Collection Time    06/20/13 11:45 PM      Result Value Ref Range   WBC 10.9 (*) 4.0 - 10.5 K/uL   RBC 3.03 (*) 3.87 - 5.11 MIL/uL   Hemoglobin 10.6 (*) 12.0 - 15.0 g/dL   Comment: REPEATED TO VERIFY   HCT 32.5 (*) 36.0 - 46.0 %   MCV 107.3 (*) 78.0 - 100.0 fL   MCH 35.0 (*) 26.0 - 34.0 pg   MCHC 32.6  30.0 - 36.0 g/dL   RDW 16.1 (*) 11.5 - 15.5 %   Platelets 174  150 - 400 K/uL  COMPREHENSIVE METABOLIC PANEL     Status: Abnormal   Collection Time    06/20/13 11:45 PM      Result Value Ref Range   Sodium 139  137 - 147 mEq/L   Potassium 3.9  3.7 - 5.3 mEq/L   Chloride 96  96 - 112 mEq/L   CO2 24  19 - 32 mEq/L   Glucose, Bld 147 (*) 70 - 99 mg/dL   BUN 44 (*) 6 - 23 mg/dL   Creatinine, Ser 7.28 (*) 0.50 - 1.10 mg/dL   Calcium 9.2  8.4 - 10.5 mg/dL   Total Protein 7.1  6.0 - 8.3 g/dL   Albumin 2.8 (*) 3.5 - 5.2 g/dL   AST 15  0 - 37 U/L   ALT 6  0 - 35 U/L   Alkaline Phosphatase 93  39 - 117 U/L   Total Bilirubin 0.2 (*) 0.3 - 1.2 mg/dL   GFR calc non Af Amer 5 (*) >90 mL/min   GFR calc Af Amer 5 (*) >90 mL/min   Comment: (NOTE)     The eGFR has been calculated using the CKD EPI equation.     This calculation has not been validated in all clinical situations.     eGFR's persistently <90 mL/min signify possible Chronic Kidney     Disease.  PROTIME-INR     Status: None   Collection Time    06/20/13 11:45 PM      Result Value Ref Range   Prothrombin Time 13.3  11.6 - 15.2 seconds   INR 1.03  0.00 - 1.49   Constitutional: negative for chills, fatigue, fevers and sweats Ears, nose, mouth, throat, and face: negative for earaches, hoarseness, nasal congestion and sore throat Respiratory: negative for cough, dyspnea on exertion, hemoptysis and sputum Cardiovascular: negative for chest pain, chest pressure/discomfort, dyspnea, orthopnea and  palpitations Gastrointestinal: negative for abdominal pain, change in bowel habits, nausea and vomiting Genitourinary:negative, anuric Musculoskeletal:positive for mild right foot pain; negative for arthralgias, back pain, myalgias and neck pain Neurological: negative for dizziness, headaches, paresthesia and speech problems  Physical Exam: Filed Vitals:   06/21/13 0802  BP: 101/37  Pulse: 58  Temp:   Resp: 23     General appearance: alert, cooperative and no distress Head: Normocephalic, without obvious abnormality, atraumatic Neck: no adenopathy, no carotid bruit, no JVD and supple, symmetrical, trachea midline Resp: clear to auscultation bilaterally Cardio: regular rate and rhythm, S1, S2 normal, no murmur, click, rub or gallop GI: soft, non-tender; bowel sounds normal; no masses,  no organomegaly Extremities: No edema, atraumatic, mild local swelling and darkening of R 1st & 2nd toes and L 2nd toe  Neurologic: Grossly normal Dialysis Access: R IJ catheter   Assessment/Plan: 1. R foot gangrene - unable to tolerate aortogram, but Doppler showed minimal flow in R foot; only remaining option is amputation per Dr. Oneida Alar. 2. L femoral occlusion - s/p thrombectomy of L femoral artery yesterday. 3. ESRD - HD on TTS @ [place], K 3.9.  HD today before discharge. 4. Hypertension/volume - BP 101/37 on Amlodipine, Lisinopril, & Hydralazine; wt 53.5 kg, UF goal 3 L as tolerated. 5. Anemia - Hgb 10.6 on outpatient Epogen. 6. Metabolic bone disease - Ca 9.2, last P 8.4, iPTH 171; no Hecorol, Phoslo 3 with meals. 7. Nutrition - Alb 3.4, currently liquid diet, multivitamin.  Ramiro Harvest 06/21/2013, 9:52 AM  I have seen and examined this patient and agree with plan per Ramiro Harvest.  80yo BF with ESRD and PVD, SP thrombectomy of Lt femoral Art.  Also has chronic ischemic toes on Rt foot.  Will plan HD today.  Note plans for DC later today. Bennie Dallas 06/21/2013 11:05 AM Attending  Nephrologist: Fleet Contras, MD

## 2013-06-21 NOTE — Telephone Encounter (Addendum)
Message copied by Gena Fray on Thu Jun 21, 2013  2:26 PM ------      Message from: [place], [place] K      Created: Thu Jun 21, 2013  8:48 AM      Regarding: Schedule                   ----- Message -----         From: Gabriel Earing, PA-C         Sent: 06/21/2013   8:46 AM           To: [facility] Charge Pool            S/p Thrombectomy left femoral artery.  F/u with Dr. Oneida Alar in 2 weeks.            Thanks ------  06/21/13: lm with Mendel Corning, dpm

## 2013-06-21 NOTE — Clinical Social Work Psychosocial (Signed)
Clinical Social Work Department BRIEF PSYCHOSOCIAL ASSESSMENT 06/21/2013  Patient:  Nicole [name], Nicole [name]     Account Number:  [id number]     Admit date:  06/20/2013  Clinical Social Worker:  Lovey Newcomer  Date/Time:  06/21/2013 10:30 AM  Referred by:  Physician  Date Referred:  06/21/2013 Referred for  SNF Placement   Other Referral:   Interview type:  Patient Other interview type:   Patient alert and oriented at time of assessment.    PSYCHOSOCIAL DATA Living Status:  FACILITY Admitted from facility:  [facility] Level of care:  [place] Primary support name:  Jaely Silman Primary support relationship to patient:  CHILD, ADULT Degree of support available:   Support is adequate.    CURRENT CONCERNS Current Concerns  Post-Acute Placement   Other Concerns:    SOCIAL WORK ASSESSMENT / PLAN CSW met with patient at bedside to complete assessment. Patient confirms that she is from [facility] and plans to return at discharge. Patient states that she has been at [facility] for about 5 months and likes it there. Patient states that she has several family members in [facility] but her main support person is her son Richardson Landry. Patient appeared tired during assessment, but content with plan to return to [facility].   Assessment/plan status:  Psychosocial Support/Ongoing Assessment of Needs Other assessment/ plan:   Complete FL2, Fax   Information/referral to community resources:   CSW contact information given to patient.    PATIENT'S/FAMILY'S RESPONSE TO PLAN OF CARE: Patient plans to return to [facility] at discharge. Patient was pleasant, appropriate, and appreciative of CSW contact. CSW will assist with DC.       Liz Beach MSW, [place], [place], [phone]

## 2013-06-21 NOTE — Progress Notes (Addendum)
[facility] [place]  Subjective  - patient is having increased pain in the right foot greater than the left.  She does not want more surgery.   Objective 103/50 68 98.8 F (37.1 C) (Oral) 27 99%  Intake/Output Summary (Last 24 hours) at 06/21/13 0804 Last data filed at 06/21/13 0100  Gross per 24 hour  Intake    990 ml  Output    400 ml  Net    590 ml    No doppler signals on both feet. Left groin incision is clean and dry with out hematoma  Assessment/Planning: POD #1 Thrombectomy left femoral artery Abdominal aortogram  Will discuss care plan with Dr. Oneida Alar We may need a palliative consult for possible DNR code status changes pending patients wishes   Ulyses Amor 06/21/2013 8:04 AM  Groin incision clean Faint left peroneal doppler, minimal flow right foot Pt unable to tolerated agram Will have dialysis here today then d/c home She can arrange for palliative care or code status changes as outpt. Only thing I have to offer her for worsening pain or gangrene is amputation.  Not a revasc candidate Spoke with Dr Joelyn Oms who will arrange dialysis prior to d/c  Ruta Hinds, MD [facility] [place]: [phone] Pager: [phone]  --  Laboratory Lab Results:  Recent Labs  06/20/13 0910 06/20/13 2345  WBC  --  10.9*  HGB 14.3 10.6*  HCT 42.0 32.5*  PLT  --  174   BMET  Recent Labs  06/20/13 0910 06/20/13 2345  NA 140 139  K 3.8 3.9  CL 98 96  CO2  --  24  GLUCOSE 101* 147*  BUN 36* 44*  CREATININE 6.60* 7.28*  CALCIUM  --  9.2    COAG Lab Results  Component Value Date   INR 1.03 06/20/2013   INR 0.97 09/06/2012   INR 1.03 03/22/2012   No results found for this basename: PTT

## 2013-06-21 NOTE — Clinical Social Work Note (Signed)
Per MD patient ready to DC back to [facility] SNF today. RN, patient, and facility aware of DC. RN given number for report (on DC packet). RN instructed to get signature page back from PA and place in packet prior to discharging patient. CSW left number for ambulance transport on patient's DC packet for RN. CSW signing off at this.    Liz Beach MSW, [place], [place], [phone]

## 2013-06-21 NOTE — Discharge Summary (Signed)
[facility] Discharge Summary  Nicole [name] 08-20-1932 80 y.o. female  [id number]  Admission Date: 06/20/2013  Discharge Date: 06/21/13  Physician: Elam Dutch, MD  Admission Diagnosis: asc toe left foot   HPI:   This is a 80 y.o. female who presents for evaluation of gangrene right foot. The patient states that the toes of her right foot had been slowly progressively worse over the last month. She states originally she had an ulcer from trimming her nails too closely. She has chronic pain in the right foot. She had some type of "revascularization 14 years ago" she does not remember what this was. Her history states aortobifemoral bypass.Review of an old CT scan shows this is probably an aortobiiliac bypass. She does not really describe claudication. However she is only minimally ambulatory. She is on chronic hemodialysis. Her dialysis today is Tuesday Thursday Saturday. She currently dialyzes from a right-sided catheter. She has had a failed right forearm graft. She does not describe any problems a left foot. She states that she does have a dark-colored toe on the left foot. Other medical problems include hypertension, anemia spinal stenosis prior pulmonary embolus, bradycardia. All these are currently controlled. She is currently smoking. Greater than 3 minutes they spent regarding smoking cessation counseling.  Hospital Course:  The patient was admitted to the hospital and taken to the PVL on 06/20/2013 and underwent: abdominal aortogram    The pt tolerated the procedure well and was transported to the PACU in good condition. After arriving in short stay, she was noted to not have doppler flow in her left foot.  She was difficult to examine due to her combativeness.  She has an aortobifem bypass. She had a fairly straight forward stick but was moving around significantly during and after the exam. She has doppler flow in the left groin but it is difficult to see is  she has a femoral pulse due to her agitation. Will have vascular lab duplex her left groin to make sure the aortobifem is patent.  She was noted to have an occlusion of the left limb of her aortobifem and went emergently to the OR for thrombectomy of left femoral artery.  She tolerated well and was transferred to the PACU in stable condition.    On POD 1, she has a faint left peroneal doppler signal and minimal flow in the right foot.  She is unable to tolerate agram and does not want any further surgery.  She is not a revascularization candidate and her only option would be amputation for worsening pain or gangrene.  She will have dialysis and then be discharged home after that.  The remainder of the hospital course consisted of increasing mobilization and increasing intake of solids without difficulty.  CBC    Component Value Date/Time   WBC 10.9* 06/20/2013 2345   RBC 3.03* 06/20/2013 2345   RBC 3.30* 12/14/2009 1909   HGB 10.6* 06/20/2013 2345   HCT 32.5* 06/20/2013 2345   PLT 174 06/20/2013 2345   MCV 107.3* 06/20/2013 2345   MCH 35.0* 06/20/2013 2345   MCHC 32.6 06/20/2013 2345   RDW 16.1* 06/20/2013 2345   LYMPHSABS 1.9 02/13/2013 2120   MONOABS 0.7 02/13/2013 2120   EOSABS 0.5 02/13/2013 2120   BASOSABS 0.0 02/13/2013 2120    BMET    Component Value Date/Time   NA 139 06/20/2013 2345   K 3.9 06/20/2013 2345   CL 96 06/20/2013 2345   CO2 24 06/20/2013  2345   GLUCOSE 147* 06/20/2013 2345   BUN 44* 06/20/2013 2345   CREATININE 7.28* 06/20/2013 2345   CALCIUM 9.2 06/20/2013 2345   CALCIUM 9.1 09/12/2012 1257   GFRNONAA 5* 06/20/2013 2345   GFRAA 5* 06/20/2013 2345     Discharge Instructions:   The patient is discharged to home with extensive instructions on wound care and progressive ambulation.  They are instructed not to drive or perform any heavy lifting until returning to see the physician in his office.  Discharge Orders   Future Orders Complete By Expires   Call MD for:  redness,  tenderness, or signs of infection (pain, swelling, bleeding, redness, odor or green/yellow discharge around incision site)  As directed    Call MD for:  severe or increased pain, loss or decreased feeling  in affected limb(s)  As directed    Call MD for:  temperature >100.5  As directed    Driving Restrictions  As directed    Resume previous diet  As directed       Discharge Diagnosis:  asc toe left foot  Secondary Diagnosis: Patient Active Problem List   Diagnosis Date Noted  . PAD (peripheral artery disease) 06/20/2013  . Ischemic foot 06/14/2013  . Atherosclerosis of native arteries of the extremities with ulceration(440.23) 06/14/2013  . Bradycardia 05/07/2013  . End stage renal disease 04/23/2013  . Anorexia 11/29/2012  . Depressive disorder, not elsewhere classified 11/29/2012  . Hypercalcemia 10/30/2012  . Essential thrombocythemia 10/30/2012  . Macrocytosis 10/30/2012  . Unspecified constipation 10/28/2012  . Secondary renovascular hypertension, benign 10/18/2012  . Unspecified protein-calorie malnutrition 09/10/2012  . Discitis of lumbar region 09/05/2012  . Osteomyelitis of lumbar spine 09/05/2012  . Psoas abscess 09/05/2012  . Acute back pain 03/21/2012  . HTN (hypertension), malignant 03/21/2012  . ? Osteomyelitis vs diskitis spine 03/21/2012  . Abnormal involuntary movements(781.0) 03/21/2012  . Pulmonary edema 06/15/2011  . Anemia in chronic kidney disease(285.21) 06/15/2011  . Hypothyroidism 06/12/2011  . Hypertension 06/11/2011  . ESRD (end stage renal disease) on dialysis 06/11/2011  . PVD (peripheral vascular disease) 06/11/2011   Past Medical History  Diagnosis Date  . Hypertension   . ESRD (end stage renal disease)   . Peripheral vascular disease     revascularization appx. 14 years ago  . Hypothyroidism   . Hiatal hernia   . Iron deficiency anemia   . Osteoarthritis   . Depression with anxiety   . Pulmonary embolism   . AAA (abdominal aortic  aneurysm) 1999    aortic byfem bypass  . Anemia in chronic kidney disease(285.21)   . Spinal stenosis, lumbar region, without neurogenic claudication   . Other specified disease of white blood cells   . Hypopotassemia   . Other abnormal blood chemistry   . Macrocytosis   . Depression   . Anxiety   . Bradycardia   . H/O prolonged Q-T interval on ECG     Referral to EP       Medication List         acetaminophen 325 MG tablet  Commonly known as:  TYLENOL  Take 650 mg by mouth every 4 (four) hours as needed for mild pain.     amLODipine 10 MG tablet  Commonly known as:  NORVASC  Take 10 mg by mouth at bedtime.     calcium acetate 667 MG capsule  Commonly known as:  PHOSLO  Take 2,001 mg by mouth 3 (three) times daily with meals.  calcium acetate 667 MG capsule  Commonly known as:  PHOSLO  Take 667 mg by mouth with snacks.     CERTA-VITE PO  Take 1 tablet by mouth daily.     docusate sodium 100 MG capsule  Commonly known as:  COLACE  Take 100 mg by mouth 2 (two) times daily as needed for mild constipation.     doxycycline 100 MG tablet  Commonly known as:  VIBRA-TABS  Take 100 mg by mouth 2 (two) times daily. For 10 days started 06/13/13     hydrALAZINE 25 MG tablet  Commonly known as:  APRESOLINE  Take 25 mg by mouth See admin instructions. Take 25 mg twice daily after dialysis days on Tuesday Thursday and Saturday     HYDROcodone-acetaminophen 10-325 MG per tablet  Commonly known as:  NORCO  Take 1 tablet by mouth every 6 (six) hours as needed for moderate pain.     levothyroxine 100 MCG tablet  Commonly known as:  SYNTHROID, LEVOTHROID  Take 100 mcg by mouth daily before breakfast.     lisinopril 40 MG tablet  Commonly known as:  PRINIVIL,ZESTRIL  Take 40 mg by mouth at bedtime.     mirtazapine 15 MG tablet  Commonly known as:  REMERON  Take 15 mg by mouth at bedtime.     polyethylene glycol packet  Commonly known as:  MIRALAX / GLYCOLAX  Take 17 g  by mouth daily as needed for mild constipation or moderate constipation.     traMADol 50 MG tablet  Commonly known as:  ULTRAM  Take 50 mg by mouth 3 (three) times daily.        Norco #30 No Refill  Disposition: home  Patient's condition: is Fair  Follow up: 1. Dr. Oneida Alar in 2 weeks   Leontine Locket, PA-C [facility] [phone] 06/21/2013  8:48 AM

## 2013-06-21 NOTE — Progress Notes (Signed)
Utilization Review Completed.Neoma Laming T Dowell4/16/2015

## 2013-06-22 ENCOUNTER — Other Ambulatory Visit: Payer: Self-pay | Admitting: *Deleted

## 2013-06-22 ENCOUNTER — Non-Acute Institutional Stay (SKILLED_NURSING_FACILITY): Payer: Medicare Other | Admitting: Internal Medicine

## 2013-06-22 DIAGNOSIS — I739 Peripheral vascular disease, unspecified: Secondary | ICD-10-CM

## 2013-06-22 DIAGNOSIS — D539 Nutritional anemia, unspecified: Secondary | ICD-10-CM

## 2013-06-22 DIAGNOSIS — Z992 Dependence on renal dialysis: Secondary | ICD-10-CM

## 2013-06-22 DIAGNOSIS — N186 End stage renal disease: Secondary | ICD-10-CM

## 2013-06-22 MED ORDER — HYDROCODONE-ACETAMINOPHEN 10-325 MG PO TABS: 1.0000 | ORAL_TABLET | Freq: Four times a day (QID) | ORAL | Status: DC | PRN

## 2013-06-22 MED ORDER — HYDROCODONE-ACETAMINOPHEN 10-325 MG PO TABS
1.0000 | ORAL_TABLET | Freq: Four times a day (QID) | ORAL | Status: DC | PRN
Start: 2013-06-22 — End: 2013-06-22

## 2013-06-22 NOTE — Telephone Encounter (Signed)
Rx faxed to [facility] @ [phone] 

## 2013-06-22 NOTE — Telephone Encounter (Signed)
Rx faxed to [facility] @ [phone]

## 2013-06-22 NOTE — Telephone Encounter (Signed)
rx faxed to [facility] @ [phone]. 

## 2013-06-25 NOTE — Progress Notes (Addendum)
Patient ID: Nicole [name], female   DOB: [birth year], 80 y.o.   MRN: [id number]                    HISTORY & PHYSICAL  DATE:  06/22/2013    FACILITY: [facility]    LEVEL OF CARE:   SNF   CHIEF COMPLAINT:  Review of recent hospitalization under Vascular Surgery.    HISTORY OF PRESENT ILLNESS:  This is a 80 year-old woman who has been in the facility since January 2014, who I believe came to [place] after having recurrent lumbar diskitis and osteomyelitis.    She has end-stage renal disease, on dialysis.    She was admitted to hospital by Vascular Surgery for review of pain in her right toes.  She had a prior history of an aortoiliac bypass.  She is only minimally ambulatory.  She was admitted to hospital and went to have an abdominal aortogram on 06/20/2013.  She was noted not to have a doppler flow in her left foot.  She was noted to have an occlusion of the left limb of her aorta bi-fem and was taken urgently to the OR for a thrombectomy of the left femoral artery.  She appears to have tolerated this well.  She was not felt to be a candidate for any further re-vascularization and the only option if she developed severe pain or worsening gangrene would be amputation.    PAST MEDICAL HISTORY/PROBLEM LIST:  Includes.    Hypertension.    End-stage renal disease.  On dialysis.    Peripheral vascular disease with the re-vascularization described 14 years ago.    Hypothyroidism.    Hiatal hernia.    Iron deficiency anemia.    Depression with anxiety.    History of pulmonary embolism.    Anemia of chronic renal disease.    Lumbar spinal stenosis with a history of diskitis.    CURRENT MEDICATIONS:  Medication list is reviewed.    PHYSICAL EXAMINATION:   VITAL SIGNS:   O2 SATURATIONS:  92% on room air.   RESPIRATIONS:  18 and unlabored.   CHEST/RESPIRATORY:  Shallow, but otherwise clear air entry bilaterally.   CARDIOVASCULAR:  CARDIAC:  Midsystolic murmur.  No evidence of  CHF.   GASTROINTESTINAL:  ABDOMEN:   Soft.   LIVER/SPLEEN/KIDNEYS:  No liver, no spleen.   SKIN:  INSPECTION:  Left groin:  She has a clean incision in the left groin area from her thrombectomy procedure.    CIRCULATION:   ARTERIAL:  Extremities:  Her peripheral pulses are not palpable at the popliteal or in her feet.  However, I do not see any open wounds.  The patient states her foot is a lot better.    ASSESSMENT/PLAN:  PAD with thrombectomy as described.  She is not felt to be a candidate for any further re-vascularization as noted above.  If there is further disabling pain or wounds, then amputation was suggested.    End-stage renal disease.  On dialysis.    Iron deficiency anemia.  Lab work as shown below.    Extreme macrocytosis; I will need to research this. Ddx, b12,folate, myelodysplastic etc  The patient appears to be stable.  She is followed at dialysis, as well.    CPT CODE: 75643       Results for Nicole, [name] (MRN [id number]) as of 06/29/2013 06:48  Ref. Range 06/20/2013 22:24 06/20/2013 23:39 06/20/2013 23:45 06/21/2013 10:30  Sodium Latest Range: 137-147 mEq/L  139 139  Potassium Latest Range: 3.7-5.3 mEq/L   3.9 4.9  Chloride Latest Range: 96-112 mEq/L   96 94 (L)  CO2 Latest Range: 19-32 mEq/L   24 24  BUN Latest Range: 6-23 mg/dL   44 (H) 50 (H)  Creatinine Latest Range: 0.50-1.10 mg/dL   7.28 (H) 8.54 (H)  Calcium Latest Range: 8.4-10.5 mg/dL   9.2 8.7  GFR calc non Af Amer Latest Range: >90 mL/min   5 (L) 4 (L)  GFR calc Af Amer Latest Range: >90 mL/min   5 (L) 5 (L)  Glucose Latest Range: 70-99 mg/dL   147 (H) 115 (H)  Phosphorus Latest Range: 2.3-4.6 mg/dL    7.1 (H)  Alkaline Phosphatase Latest Range: 39-117 U/L   93   Albumin Latest Range: 3.5-5.2 g/dL   2.8 (L) 2.6 (L)  AST Latest Range: 0-37 U/L   15   ALT Latest Range: 0-35 U/L   6   Total Protein Latest Range: 6.0-8.3 g/dL   7.1   Total Bilirubin Latest Range: 0.3-1.2 mg/dL   0.2 (L)   WBC Latest  Range: 4.0-10.5 K/uL   10.9 (H) 9.8  RBC Latest Range: 3.87-5.11 MIL/uL   3.03 (L) 2.50 (L)  Hemoglobin Latest Range: 12.0-15.0 g/dL   10.6 (L) 8.7 (L)  HCT Latest Range: 36.0-46.0 %   32.5 (L) 27.1 (L)  MCV Latest Range: 78.0-100.0 fL   107.3 (H) 108.4 (H)  MCH Latest Range: 26.0-34.0 pg   35.0 (H) 34.8 (H)  MCHC Latest Range: 30.0-36.0 g/dL   32.6 32.1  RDW Latest Range: 11.5-15.5 %   16.1 (H) 16.3 (H)  Platelets Latest Range: 150-400 K/uL   174 157  Prothrombin Time Latest Range: 11.6-15.2 seconds   13.3   INR Latest Range: 0.00-1.49    1.03

## 2013-07-04 ENCOUNTER — Encounter: Payer: Self-pay | Admitting: Vascular Surgery

## 2013-07-05 ENCOUNTER — Encounter: Payer: Medicare Other | Admitting: Vascular Surgery

## 2013-07-11 ENCOUNTER — Encounter: Payer: Self-pay | Admitting: Vascular Surgery

## 2013-07-12 ENCOUNTER — Encounter: Payer: Medicare Other | Admitting: Vascular Surgery

## 2013-07-16 ENCOUNTER — Non-Acute Institutional Stay (SKILLED_NURSING_FACILITY): Payer: Medicare Other | Admitting: Internal Medicine

## 2013-07-16 DIAGNOSIS — I150 Renovascular hypertension: Secondary | ICD-10-CM

## 2013-07-16 DIAGNOSIS — N039 Chronic nephritic syndrome with unspecified morphologic changes: Secondary | ICD-10-CM

## 2013-07-16 DIAGNOSIS — E039 Hypothyroidism, unspecified: Secondary | ICD-10-CM

## 2013-07-16 DIAGNOSIS — K5900 Constipation, unspecified: Secondary | ICD-10-CM

## 2013-07-16 DIAGNOSIS — D631 Anemia in chronic kidney disease: Secondary | ICD-10-CM

## 2013-07-16 NOTE — Progress Notes (Signed)
        PROGRESS NOTE  DATE: 07-16-13  FACILITY: Nursing Home Location: [facility]  LEVEL OF CARE: SNF (31)  Routine Visit  CHIEF COMPLAINT:  Manage hypothyroidism, HTN & constipation  HISTORY OF PRESENT ILLNESS:  REASSESSMENT OF ONGOING PROBLEM(S):  HYPOTHYROIDISM: The hypothyroidism remains stable. No complications noted from the medications presently being used.  The patient denies fatigue or constipation.  Last TSH 0.051 on 11-08-12, in 11-14 TSH 0.513  HTN: Pt 's HTN remains stable.  Denies CP, sob, DOE, pedal edema, headaches, dizziness or visual disturbances.  No complications from the medications currently being used.  Last BP :140/70, 132/70, 110/58, 94/57  CONSTIPATION: The constipation remains stable. No complications from the medications presently being used. Patient denies ongoing constipation, abdominal pain, nausea or vomiting.  PAST MEDICAL HISTORY : Reviewed.  No changes.  CURRENT MEDICATIONS: Reviewed per [facility]  REVIEW OF SYSTEMS:  GENERAL: no change in appetite, no fatigue, no weight changes, no fever, chills or weakness RESPIRATORY: no cough, SOB, DOE, wheezing, hemoptysis CARDIAC: no chest pain,  or palpitations, complains of lower extremity swelling GI: no abdominal pain, diarrhea, constipation, heart burn, nausea or vomiting  PHYSICAL EXAMINATION  VS:  See vital signs section  GENERAL: no acute distress, normal body habitus EYES: conjunctivae normal, sclerae normal, normal eye lids NECK: supple, trachea midline, no neck masses, no thyroid tenderness, no thyromegaly LYMPHATICS: no LAN in the neck, no supraclavicular LAN RESPIRATORY: breathing is even & unlabored, BS CTAB CARDIAC: RRR, no murmur,no extra heart sounds, +2 bilateral lower extremity edema GI: abdomen soft, normal BS, no masses, no tenderness, no hepatomegaly, no splenomegaly PSYCHIATRIC: the patient is alert & oriented to person, affect & behavior  appropriate  LABS/RADIOLOGY: 4-15 hemoglobin 9.6, MCV 106 otherwise CBC normal, creatinine 4.75 otherwise CMP normal, vitamin B12 level 771, folate greater than 19.9, vitamin D level 39.5 8/14 AP 130 ow liver profile nl  7/14 wbc 6.3, Hb 10.9, mcv 101, plt 406, cr 6.16, Ca 10.8 ow bmp nl, vitamin B12 level 1259, folate 16.6  ASSESSMENT/PLAN:  Hypothyroidism-well controlled. Check TSH Renovascular HTN-well controlled. Constipation -well controlled. Anemia of chronic kidney dz-stable ESRD-on HD Depression-continue Remeron Hyperphosphatemia-on PhosLo  CPT CODE: 18841  Edgar Frisk. Durwin Reges, [place] [phone]

## 2013-07-18 ENCOUNTER — Encounter: Payer: Self-pay | Admitting: Vascular Surgery

## 2013-07-19 ENCOUNTER — Ambulatory Visit (INDEPENDENT_AMBULATORY_CARE_PROVIDER_SITE_OTHER): Payer: Medicare Other | Admitting: Vascular Surgery

## 2013-07-19 ENCOUNTER — Encounter: Payer: Self-pay | Admitting: Vascular Surgery

## 2013-07-19 VITALS — BP 124/68 | HR 64 | Ht 62.0 in | Wt 102.0 lb

## 2013-07-19 DIAGNOSIS — I739 Peripheral vascular disease, unspecified: Secondary | ICD-10-CM

## 2013-07-19 DIAGNOSIS — L98499 Non-pressure chronic ulcer of skin of other sites with unspecified severity: Principal | ICD-10-CM

## 2013-07-19 MED ORDER — OXYCODONE HCL 5 MG PO TABS: 5.0000 mg | ORAL_TABLET | ORAL | Status: DC | PRN

## 2013-07-19 NOTE — Progress Notes (Signed)
Patient is a 80 year old female returns for followup today. She recently had an attempt at a diagnostic arteriogram for rest pain in her right foot. She was unable to cooperate with the exam. She subsequently occluded the left limb of her aortobifemoral bypass do to inability to cooperate post procedure after the arteriogram. Most of these issues seem to be related to dementia. She is essentially nonambulatory. She returns today for followup with worsening right foot symptoms. She has rest pain in the right foot. She has noticed darkish discoloration of several toes on the right foot.  She was also referred by the dialysis center for evaluation for placement of a new hemodialysis access. She currently is dialyzing via a right-sided catheter. She has previously had bilateral forearm AV grafts.    Past Medical History  Diagnosis Date  . Hypertension   . ESRD (end stage renal disease)   . Peripheral vascular disease     revascularization appx. 14 years ago  . Hypothyroidism   . Hiatal hernia   . Iron deficiency anemia   . Osteoarthritis   . Depression with anxiety   . Pulmonary embolism   . AAA (abdominal aortic aneurysm) 1999    aortic byfem bypass  . Anemia in chronic kidney disease(285.21)   . Spinal stenosis, lumbar region, without neurogenic claudication   . Other specified disease of white blood cells   . Hypopotassemia   . Other abnormal blood chemistry   . Macrocytosis   . Depression   . Anxiety   . Bradycardia   . H/O prolonged Q-T interval on ECG     Referral to EP   Past Surgical History  Procedure Laterality Date  . Abdominal surgery  about 14 years ago    REVASCULARIZATION  . Ateriovenous graft  04/18/2009    LEFT FOREARM avg  . Av fistula placement, radiocephalic  16/96/78  . Avgg removal  12/02/09    SECONDARY TO INFECTION LT FOREARM  . Av fistula placement, radiocephalic  11/09/79    NONMATURED  . Arteriovenous graft placement  07/24/10    RIGHT FOREARM LOOP  . Dg av  dialysis graft declot or  12/05/10, 6/17/123, 09/24/11, 01/26/12    CLOTTED GRAFT  . Angioplasty  12/21/11    of RUE AVG  . Thrombectomy femoral artery Left 06/20/2013    Procedure: THROMBECTOMY FEMORAL ARTERY;  Surgeon: Elam Dutch, MD;  Location: [facility];  Service: Vascular;  Laterality: Left;   Aortobifemoral bypass  Current Outpatient Prescriptions on File Prior to Visit  Medication Sig Dispense Refill  . acetaminophen (TYLENOL) 325 MG tablet Take 650 mg by mouth every 4 (four) hours as needed for mild pain.       Marland Kitchen amLODipine (NORVASC) 10 MG tablet Take 10 mg by mouth at bedtime.      . calcium acetate (PHOSLO) 667 MG capsule Take 2,001 mg by mouth 3 (three) times daily with meals.       . calcium acetate (PHOSLO) 667 MG capsule Take 667 mg by mouth with snacks.      . docusate sodium (COLACE) 100 MG capsule Take 100 mg by mouth 2 (two) times daily as needed for mild constipation.      Marland Kitchen doxycycline (VIBRA-TABS) 100 MG tablet Take 100 mg by mouth 2 (two) times daily. For 10 days started 06/13/13      . hydrALAZINE (APRESOLINE) 25 MG tablet Take 25 mg by mouth See admin instructions. Take 25 mg twice daily after dialysis days on  Tuesday Thursday and Saturday      . levothyroxine (SYNTHROID, LEVOTHROID) 100 MCG tablet Take 100 mcg by mouth daily before breakfast.      . lisinopril (PRINIVIL,ZESTRIL) 40 MG tablet Take 40 mg by mouth at bedtime.      . mirtazapine (REMERON) 15 MG tablet Take 15 mg by mouth at bedtime.      . Multiple Vitamins-Minerals (CERTA-VITE PO) Take 1 tablet by mouth daily.      . polyethylene glycol (MIRALAX / GLYCOLAX) packet Take 17 g by mouth daily as needed for mild constipation or moderate constipation.      . traMADol (ULTRAM) 50 MG tablet Take 50 mg by mouth 3 (three) times daily.       No current facility-administered medications on file prior to visit.    No Known Allergies  Physical exam:  Filed Vitals:   07/19/13 1204  BP: 124/68  Pulse: 64   Height: 5\' 2"  (1.575 m)  Weight: 102 lb (46.267 kg)  SpO2: 100%    Extremities: Left groin incision well-healed, 2+ femoral pulses bilaterally, absent popliteal and pedal pulses bilaterally Early gangrenous changes for toes right foot and left second toe, bilateral lower extremity edema 1+  Right and left forearm AV graft occlusions, right side dialysis catheter no surrounding drainage erythema or tenderness  Assessment: Bilateral severe peripheral arterial occlusive disease. Right foot worse than left. Primary complaints are on the right side. The patient is not ambulatory. She has dementia. She was unable to tolerate a diagnostic arteriogram and I do not believe she is a candidate for any revascularization procedures of her lower extremities. I have offered her a right above-knee amputation for pain control. She currently wishes pain medication only. I gave her prescription today for oxycodone #40 dispensed. I discontinued her previous prescription for Norco.  I do not believe narcotics are going to be a good long-term solution for her. I will not refill her pain medications further. I discussed with her today that she needs to have discussions with her son regarding a possible right above-knee amputation. The patient will followup on as-needed basis if she wishes to have her right leg amputation.  As far as a new permanent hemodialysis access is concerned. The patient was fairly resistant to this idea. Additionally I would not consider placing a new access until the leg issues have been addressed.  Ruta Hinds, MD [facility] Office: [phone] Pager: [phone]

## 2013-07-20 ENCOUNTER — Other Ambulatory Visit: Payer: Self-pay | Admitting: *Deleted

## 2013-07-20 MED ORDER — OXYCODONE HCL 5 MG PO TABS: 5.0000 mg | ORAL_TABLET | ORAL | Status: DC | PRN

## 2013-07-20 NOTE — Telephone Encounter (Signed)
[facility] 

## 2013-07-23 ENCOUNTER — Non-Acute Institutional Stay (SKILLED_NURSING_FACILITY): Payer: Medicare Other | Admitting: Internal Medicine

## 2013-07-23 DIAGNOSIS — D7289 Other specified disorders of white blood cells: Secondary | ICD-10-CM

## 2013-07-23 DIAGNOSIS — D631 Anemia in chronic kidney disease: Secondary | ICD-10-CM

## 2013-07-23 DIAGNOSIS — N039 Chronic nephritic syndrome with unspecified morphologic changes: Principal | ICD-10-CM

## 2013-07-25 DIAGNOSIS — D7289 Other specified disorders of white blood cells: Secondary | ICD-10-CM | POA: Insufficient documentation

## 2013-07-25 NOTE — Progress Notes (Signed)
Patient ID: Nicole [name], female   DOB: 1932/09/19, 80 y.o.   MRN: [id number]            PROGRESS NOTE  DATE: 07/23/2013       FACILITY:  [facility]  LEVEL OF CARE: SNF (31)  Acute Visit  CHIEF COMPLAINT:  Manage anemia of chronic kidney disease and leukocytosis.     HISTORY OF PRESENT ILLNESS: I was requested by the staff to assess the patient regarding above problem(s):  ANEMIA: The anemia has been stable. The patient denies fatigue, melena or hematochezia. No complications from the medications currently being used.   On 07/18/2013:  Hemoglobin 10.4, MCV 107.  In 06/2013:  Hemoglobin 9.6, MCV 106.     LEUKOCYTOSIS:  New problem.   On 07/18/2013:  WBC 12.3, ANC 9.4.  In 06/2013:  WBC 6.5.   Patient denies fever, chills, night sweats, cough, or shortness of breath.    PAST MEDICAL HISTORY : Reviewed.  No changes/see problem list  CURRENT MEDICATIONS: Reviewed per MAR/see medication list  REVIEW OF SYSTEMS:  GENERAL: no change in appetite, no fatigue, no weight changes, no fever, chills or weakness RESPIRATORY: no cough, SOB, DOE,, wheezing, hemoptysis CARDIAC: no chest pain, edema or palpitations GI: no abdominal pain, diarrhea, constipation, heart burn, nausea or vomiting  PHYSICAL EXAMINATION  VS:  T 97.6       P 72      RR 18      BP 94/57     POX 97%       WT (Lb) 110      GENERAL: no acute distress, normal body habitus NECK: supple, trachea midline, no neck masses, no thyroid tenderness, no thyromegaly RESPIRATORY: breathing is even & unlabored, BS CTAB CARDIAC: RRR, no murmur,no extra heart sounds, +2 bilateral lower extremity edema     GI: abdomen soft, normal BS, no masses, no tenderness, no hepatomegaly, no splenomegaly PSYCHIATRIC: the patient is alert & oriented to person, affect & behavior appropriate  ASSESSMENT/PLAN:  Anemia of chronic kidney disease.   Hemoglobin improved.      Leukocytosis.  New problem.  Patient is asymptomatic.  We  will monitor.    CPT CODE: 95284        [name], [place] [phone]

## 2013-07-26 ENCOUNTER — Encounter: Payer: Medicare Other | Admitting: Vascular Surgery

## 2013-08-08 ENCOUNTER — Non-Acute Institutional Stay (SKILLED_NURSING_FACILITY): Payer: Medicare Other | Admitting: Internal Medicine

## 2013-08-08 DIAGNOSIS — K5900 Constipation, unspecified: Secondary | ICD-10-CM

## 2013-08-08 DIAGNOSIS — N039 Chronic nephritic syndrome with unspecified morphologic changes: Secondary | ICD-10-CM

## 2013-08-08 DIAGNOSIS — I150 Renovascular hypertension: Secondary | ICD-10-CM

## 2013-08-08 DIAGNOSIS — E039 Hypothyroidism, unspecified: Secondary | ICD-10-CM | POA: Insufficient documentation

## 2013-08-08 DIAGNOSIS — D631 Anemia in chronic kidney disease: Secondary | ICD-10-CM

## 2013-08-08 NOTE — Progress Notes (Signed)
         PROGRESS NOTE  DATE: 08-08-13  FACILITY: Nursing Home Location: [facility]  LEVEL OF CARE: SNF (31)  Routine Visit  CHIEF COMPLAINT:  Manage hypothyroidism, HTN & constipation  HISTORY OF PRESENT ILLNESS:  REASSESSMENT OF ONGOING PROBLEM(S):  HYPOTHYROIDISM: The hypothyroidism remains stable. No complications noted from the medications presently being used.  The patient denies fatigue or constipation.  Last TSH 0.051 on 11-08-12, in 11-14 TSH 0.513  HTN: Pt 's HTN remains stable.  Denies CP, sob, DOE, pedal edema, headaches, dizziness or visual disturbances.  No complications from the medications currently being used.  Last BP :140/70, 132/70, 110/58, 94/57, 110/70  CONSTIPATION: The constipation remains stable. No complications from the medications presently being used. Patient denies ongoing constipation, abdominal pain, nausea or vomiting.  PAST MEDICAL HISTORY : Reviewed.  No changes.  CURRENT MEDICATIONS: Reviewed per [facility]  REVIEW OF SYSTEMS:  GENERAL: no change in appetite, no fatigue, no weight changes, no fever, chills or weakness RESPIRATORY: no cough, SOB, DOE, wheezing, hemoptysis CARDIAC: no chest pain,  or palpitations, complains of lower extremity swelling GI: no abdominal pain, diarrhea, constipation, heart burn, nausea or vomiting  PHYSICAL EXAMINATION  VS:  See vital signs section  GENERAL: no acute distress, normal body habitus NECK: supple, trachea midline, no neck masses, no thyroid tenderness, no thyromegaly RESPIRATORY: breathing is even & unlabored, BS CTAB CARDIAC: RRR, no murmur,no extra heart sounds, +1 bilateral lower extremity edema GI: abdomen soft, normal BS, no masses, no tenderness, no hepatomegaly, no splenomegaly PSYCHIATRIC: the patient is alert & oriented to person, affect & behavior appropriate  LABS/RADIOLOGY: 5-15 WBC 12.3, hemoglobin 10.4, MCV 117, platelet 259, BUN 30, creatinine 6.1, alkaline phosphatase 18  otherwise CMP normal, vitamin B12 level 618, folate 14.7, vitamin D level 33.2 4-15 hemoglobin 9.6, MCV 106 otherwise CBC normal, creatinine 4.75 otherwise CMP normal, vitamin B12 level 771, folate greater than 19.9, vitamin D level 39.5 8/14 AP 130 ow liver profile nl  7/14 wbc 6.3, Hb 10.9, mcv 101, plt 406, cr 6.16, Ca 10.8 ow bmp nl, vitamin B12 level 1259, folate 16.6  ASSESSMENT/PLAN:  Hypothyroidism-well controlled. Check TSH Renovascular HTN-well controlled. Constipation -well controlled. Anemia of chronic kidney dz-stable ESRD-on HD Depression-continue Remeron Hyperphosphatemia-on PhosLo Right foot gangrene-vascular surgery considering a BKA  CPT CODE: 16109  Edgar Frisk. Durwin Reges, [place] [phone]

## 2013-08-13 ENCOUNTER — Non-Acute Institutional Stay (SKILLED_NURSING_FACILITY): Payer: Medicare Other | Admitting: Internal Medicine

## 2013-08-13 DIAGNOSIS — E039 Hypothyroidism, unspecified: Secondary | ICD-10-CM

## 2013-08-17 NOTE — Progress Notes (Signed)
Patient ID: Nicole [name], female   DOB: 02-27-1933, 80 y.o.   MRN: [id number]            PROGRESS NOTE  DATE: 08/13/2013        FACILITY:  [facility]  LEVEL OF CARE: SNF (31)  Acute Visit  CHIEF COMPLAINT:  Manage hypothyroidism.    HISTORY OF PRESENT ILLNESS: I was requested by the staff to assess the patient regarding above problem(s):  HYPOTHYROIDISM: The hypothyroidism is unstable. No complications noted from the medications presently being used.  The patient denies fatigue or constipation.  Last TSH:  On 08/09/2013:  TSH 45.89.    PAST MEDICAL HISTORY : Reviewed.  No changes/see problem list  CURRENT MEDICATIONS: Reviewed per MAR/see medication list  REVIEW OF SYSTEMS:  GENERAL: no change in appetite, no fatigue, no weight changes, no fever, chills or weakness RESPIRATORY: no cough, SOB, DOE,, wheezing, hemoptysis CARDIAC: no chest pain, edema or palpitations GI: no abdominal pain, diarrhea, constipation, heart burn, nausea or vomiting  PHYSICAL EXAMINATION  VS:  T 97.7       P 60      RR 18      BP 110/70     POX 97%       WT (Lb) 115        GENERAL: no acute distress, normal body habitus NECK: supple, trachea midline, no neck masses, no thyroid tenderness, no thyromegaly RESPIRATORY: breathing is even & unlabored, BS CTAB CARDIAC: RRR, no murmur,no extra heart sounds, no edema GI: abdomen soft, normal BS, no masses, no tenderness, no hepatomegaly, no splenomegaly PSYCHIATRIC: the patient is alert & oriented to person, affect & behavior appropriate  ASSESSMENT/PLAN:  Hypothyroidism.  Unstable problem.  Increase levothyroxine to 150 mcg q.d.  Check TSH in eight weeks.    CPT CODE: 35573         Remijio Holleran Y Benedicta Sultan, [place] [phone]

## 2013-08-30 ENCOUNTER — Other Ambulatory Visit: Payer: Self-pay | Admitting: *Deleted

## 2013-08-30 MED ORDER — HYDROCODONE-ACETAMINOPHEN 10-325 MG PO TABS: ORAL_TABLET | ORAL | Status: DC

## 2013-08-30 NOTE — Telephone Encounter (Signed)
[facility] 

## 2013-09-10 ENCOUNTER — Non-Acute Institutional Stay (SKILLED_NURSING_FACILITY): Payer: Medicare Other | Admitting: Internal Medicine

## 2013-09-10 DIAGNOSIS — K5900 Constipation, unspecified: Secondary | ICD-10-CM

## 2013-09-10 DIAGNOSIS — D631 Anemia in chronic kidney disease: Secondary | ICD-10-CM

## 2013-09-10 DIAGNOSIS — N039 Chronic nephritic syndrome with unspecified morphologic changes: Secondary | ICD-10-CM

## 2013-09-10 DIAGNOSIS — I150 Renovascular hypertension: Secondary | ICD-10-CM

## 2013-09-10 DIAGNOSIS — E039 Hypothyroidism, unspecified: Secondary | ICD-10-CM

## 2013-09-12 NOTE — Progress Notes (Signed)
        PROGRESS NOTE  DATE: 09-10-13  FACILITY: Nursing Home Location: [facility]  LEVEL OF CARE: SNF (31)  Routine Visit  CHIEF COMPLAINT:  Manage hypothyroidism, HTN & constipation  HISTORY OF PRESENT ILLNESS:  REASSESSMENT OF ONGOING PROBLEM(S):  HYPOTHYROIDISM: The hypothyroidism remains stable. No complications noted from the medications presently being used.  The patient denies fatigue or constipation.  Last TSH 0.051 on 11-08-12, in 11-14 TSH 0.513, in 6-15 TSH 45.89  HTN: Pt 's HTN remains stable.  Denies CP, sob, DOE, pedal edema, headaches, dizziness or visual disturbances.  No complications from the medications currently being used.  Last BP :140/70, 132/70, 110/58, 94/57, 110/70, 116/59  CONSTIPATION: The constipation remains stable. No complications from the medications presently being used. Patient denies ongoing constipation, abdominal pain, nausea or vomiting.  PAST MEDICAL HISTORY : Reviewed.  No changes.  CURRENT MEDICATIONS: Reviewed per [facility]  REVIEW OF SYSTEMS:  GENERAL: no change in appetite, no fatigue, no weight changes, no fever, chills or weakness RESPIRATORY: no cough, SOB, DOE, wheezing, hemoptysis CARDIAC: no chest pain,  or palpitations, complains of lower extremity swelling GI: no abdominal pain, diarrhea, constipation, heart burn, nausea or vomiting  PHYSICAL EXAMINATION  VS:  See vital signs section  GENERAL: no acute distress, normal body habitus NECK: supple, trachea midline, no neck masses, no thyroid tenderness, no thyromegaly RESPIRATORY: breathing is even & unlabored, BS CTAB CARDIAC: RRR, no murmur,no extra heart sounds, +1 bilateral lower extremity edema GI: abdomen soft, normal BS, no masses, no tenderness, no hepatomegaly, no splenomegaly PSYCHIATRIC: the patient is alert & oriented to person, affect & behavior appropriate  LABS/RADIOLOGY: 5-15 WBC 12.3, hemoglobin 10.4, MCV 117, platelet 259, BUN 30, creatinine 6.1,  alkaline phosphatase 18 otherwise CMP normal, vitamin B12 level 618, folate 14.7, vitamin D level 33.2 4-15 hemoglobin 9.6, MCV 106 otherwise CBC normal, creatinine 4.75 otherwise CMP normal, vitamin B12 level 771, folate greater than 19.9, vitamin D level 39.5 8/14 AP 130 ow liver profile nl  7/14 wbc 6.3, Hb 10.9, mcv 101, plt 406, cr 6.16, Ca 10.8 ow bmp nl, vitamin B12 level 1259, folate 16.6  ASSESSMENT/PLAN:  Hypothyroidism-uncontrolled.  levothyroxine was increased. Recheck TSH is pending. Renovascular HTN-well controlled. Constipation -well controlled. Anemia of chronic kidney dz-stable ESRD-on HD Depression-continue Remeron Hyperphosphatemia-on PhosLo Right foot gangrene-vascular surgery considering a BKA  CPT CODE: 27078  Edgar Frisk. Durwin Reges, [place] [phone]

## 2013-10-03 ENCOUNTER — Other Ambulatory Visit: Payer: Self-pay | Admitting: *Deleted

## 2013-10-03 MED ORDER — HYDROCODONE-ACETAMINOPHEN 10-325 MG PO TABS: ORAL_TABLET | ORAL | Status: DC

## 2013-10-03 NOTE — Telephone Encounter (Signed)
[facility] 

## 2013-10-24 ENCOUNTER — Non-Acute Institutional Stay (SKILLED_NURSING_FACILITY): Payer: Medicare Other | Admitting: Internal Medicine

## 2013-10-24 DIAGNOSIS — E039 Hypothyroidism, unspecified: Secondary | ICD-10-CM

## 2013-10-24 DIAGNOSIS — N039 Chronic nephritic syndrome with unspecified morphologic changes: Secondary | ICD-10-CM

## 2013-10-24 DIAGNOSIS — K5900 Constipation, unspecified: Secondary | ICD-10-CM

## 2013-10-24 DIAGNOSIS — D631 Anemia in chronic kidney disease: Secondary | ICD-10-CM

## 2013-10-24 DIAGNOSIS — I150 Renovascular hypertension: Secondary | ICD-10-CM

## 2013-10-26 ENCOUNTER — Other Ambulatory Visit: Payer: Self-pay | Admitting: *Deleted

## 2013-10-26 DIAGNOSIS — Z01810 Encounter for preprocedural cardiovascular examination: Secondary | ICD-10-CM

## 2013-10-26 DIAGNOSIS — N186 End stage renal disease: Secondary | ICD-10-CM

## 2013-10-26 NOTE — Progress Notes (Signed)
        PROGRESS NOTE  DATE: 10-24-13  FACILITY: Nursing Home Location: [facility]  LEVEL OF CARE: SNF (31)  Routine Visit  CHIEF COMPLAINT:  Manage hypothyroidism, HTN & constipation  HISTORY OF PRESENT ILLNESS:  REASSESSMENT OF ONGOING PROBLEM(S):  HYPOTHYROIDISM: The hypothyroidism remains stable. No complications noted from the medications presently being used.  The patient denies fatigue or constipation.  Last TSH 0.051 on 11-08-12, in 11-14 TSH 0.513, in 6-15 TSH 45.89  HTN: Pt 's HTN remains stable.  Denies CP, sob, DOE, pedal edema, headaches, dizziness or visual disturbances.  No complications from the medications currently being used.  Last BP :140/70, 132/70, 110/58, 94/57, 110/70, 116/59, 131/56  CONSTIPATION: The constipation remains stable. No complications from the medications presently being used. Patient denies ongoing constipation, abdominal pain, nausea or vomiting.  PAST MEDICAL HISTORY : Reviewed.  No changes.  CURRENT MEDICATIONS: Reviewed per [facility]  REVIEW OF SYSTEMS:  GENERAL: no change in appetite, no fatigue, no weight changes, no fever, chills or weakness RESPIRATORY: no cough, SOB, DOE, wheezing, hemoptysis CARDIAC: no chest pain,  or palpitations, complains of lower extremity swelling GI: no abdominal pain, diarrhea, constipation, heart burn, nausea or vomiting  PHYSICAL EXAMINATION  VS:  See vital signs section  GENERAL: no acute distress, normal body habitus NECK: supple, trachea midline, no neck masses, no thyroid tenderness, no thyromegaly RESPIRATORY: breathing is even & unlabored, BS CTAB CARDIAC: RRR, no murmur,no extra heart sounds, +1 bilateral lower extremity edema GI: abdomen soft, normal BS, no masses, no tenderness, no hepatomegaly, no splenomegaly PSYCHIATRIC: the patient is alert & oriented to person, affect & behavior appropriate  LABS/RADIOLOGY: 5-15 WBC 12.3, hemoglobin 10.4, MCV 117, platelet 259, BUN 30,  creatinine 6.1, alkaline phosphatase 18 otherwise CMP normal, vitamin B12 level 618, folate 14.7, vitamin D level 33.2 4-15 hemoglobin 9.6, MCV 106 otherwise CBC normal, creatinine 4.75 otherwise CMP normal, vitamin B12 level 771, folate greater than 19.9, vitamin D level 39.5 8/14 AP 130 ow liver profile nl  7/14 wbc 6.3, Hb 10.9, mcv 101, plt 406, cr 6.16, Ca 10.8 ow bmp nl, vitamin B12 level 1259, folate 16.6  ASSESSMENT/PLAN:  Hypothyroidism-uncontrolled.  levothyroxine was increased. Recheck TSH. Renovascular HTN-well controlled. Constipation -well controlled. Anemia of chronic kidney dz-stable ESRD-on HD Depression-continue Remeron Hyperphosphatemia-on PhosLo  CPT CODE: 32671  Edgar Frisk. Durwin Reges, [place] [employer] [phone]

## 2013-10-29 ENCOUNTER — Non-Acute Institutional Stay (SKILLED_NURSING_FACILITY): Payer: Medicare Other | Admitting: Internal Medicine

## 2013-10-29 DIAGNOSIS — E039 Hypothyroidism, unspecified: Secondary | ICD-10-CM

## 2013-10-30 ENCOUNTER — Encounter: Payer: Self-pay | Admitting: Vascular Surgery

## 2013-10-30 NOTE — Progress Notes (Signed)
Patient ID: Nicole [name], female   DOB: [birth year], 80 y.o.   MRN: [id number]           PROGRESS NOTE  DATE: 10/29/2013        FACILITY:  [facility]  LEVEL OF CARE: SNF (31)  Acute Visit  CHIEF COMPLAINT:  Manage hypothyroidism.    HISTORY OF PRESENT ILLNESS: I was requested by the staff to assess the patient regarding above problem(s):  HYPOTHYROIDISM: The hypothyroidism is unstable. No complications noted from the medications presently being used.  The patient denies fatigue or constipation.  Last TSH:  On 10/26/2013:  TSH 13.52.    PAST MEDICAL HISTORY : Reviewed.  No changes/see problem list  CURRENT MEDICATIONS: Reviewed per MAR/see medication list  REVIEW OF SYSTEMS:  GENERAL: no change in appetite, no fatigue, no weight changes, no fever, chills or weakness RESPIRATORY: no cough, SOB, DOE,, wheezing, hemoptysis CARDIAC: no chest pain, edema or palpitations GI: no abdominal pain, diarrhea, constipation, heart burn, nausea or vomiting  PHYSICAL EXAMINATION  VS: see VS section  GENERAL: no acute distress, normal body habitus NECK: supple, trachea midline, no neck masses, no thyroid tenderness, no thyromegaly RESPIRATORY: breathing is even & unlabored, BS CTAB CARDIAC: RRR, no murmur,no extra heart sounds, no edema GI: abdomen soft, normal BS, no masses, no tenderness, no hepatomegaly, no splenomegaly PSYCHIATRIC: the patient is alert & oriented to person, affect & behavior appropriate  ASSESSMENT/PLAN:  Hypothyroidism.  Uncontrolled problem.  Increase levothyroxine to 175 mcg q.d.  Check TSH in six weeks.    CPT CODE: 53646          Andria Head Y Doral Ventrella, [place] [phone]

## 2013-10-31 ENCOUNTER — Encounter (HOSPITAL_COMMUNITY): Payer: Medicare Other

## 2013-10-31 ENCOUNTER — Ambulatory Visit: Payer: Medicare Other | Admitting: Vascular Surgery

## 2013-10-31 ENCOUNTER — Other Ambulatory Visit (HOSPITAL_COMMUNITY): Payer: Medicare Other

## 2013-11-09 ENCOUNTER — Other Ambulatory Visit: Payer: Self-pay | Admitting: *Deleted

## 2013-11-09 MED ORDER — HYDROCODONE-ACETAMINOPHEN 10-325 MG PO TABS: ORAL_TABLET | ORAL | Status: DC

## 2013-11-09 NOTE — Telephone Encounter (Signed)
[facility] 

## 2013-11-20 ENCOUNTER — Encounter: Payer: Self-pay | Admitting: Vascular Surgery

## 2013-11-21 ENCOUNTER — Non-Acute Institutional Stay (SKILLED_NURSING_FACILITY): Payer: Medicare Other | Admitting: Internal Medicine

## 2013-11-21 ENCOUNTER — Ambulatory Visit: Payer: Medicare Other | Admitting: Vascular Surgery

## 2013-11-21 ENCOUNTER — Encounter (HOSPITAL_COMMUNITY): Payer: Medicare Other

## 2013-11-21 ENCOUNTER — Other Ambulatory Visit (HOSPITAL_COMMUNITY): Payer: Medicare Other

## 2013-11-21 DIAGNOSIS — D631 Anemia in chronic kidney disease: Secondary | ICD-10-CM

## 2013-11-21 DIAGNOSIS — K5900 Constipation, unspecified: Secondary | ICD-10-CM

## 2013-11-21 DIAGNOSIS — E039 Hypothyroidism, unspecified: Secondary | ICD-10-CM

## 2013-11-21 DIAGNOSIS — I150 Renovascular hypertension: Secondary | ICD-10-CM

## 2013-11-21 DIAGNOSIS — N039 Chronic nephritic syndrome with unspecified morphologic changes: Secondary | ICD-10-CM

## 2013-11-22 NOTE — Progress Notes (Signed)
        PROGRESS NOTE  DATE: 11-21-13  FACILITY: Nursing Home Location: [place] [facility]  LEVEL OF CARE: SNF (31)  Routine Visit  CHIEF COMPLAINT:  Manage hypothyroidism, HTN & constipation  HISTORY OF PRESENT ILLNESS:  REASSESSMENT OF ONGOING PROBLEM(S):  HYPOTHYROIDISM: The hypothyroidism remains stable. No complications noted from the medications presently being used.  The patient denies fatigue or constipation.  Last TSH 0.051 on 11-08-12, in 11-14 TSH 0.513, in 6-15 TSH 45.89, in 8-15 TSH 13.52.  HTN: Pt 's HTN remains stable.  Denies CP, sob, DOE, pedal edema, headaches, dizziness or visual disturbances.  No complications from the medications currently being used.  Last BP :140/70, 132/70, 110/58, 94/57, 110/70, 116/59, 131/56, 110/62  CONSTIPATION: The constipation remains stable. No complications from the medications presently being used. Patient denies ongoing constipation, abdominal pain, nausea or vomiting.  PAST MEDICAL HISTORY : Reviewed.  No changes.  CURRENT MEDICATIONS: Reviewed per [facility]  REVIEW OF SYSTEMS:  GENERAL: no change in appetite, no fatigue, no weight changes, no fever, chills or weakness RESPIRATORY: no cough, SOB, DOE, wheezing, hemoptysis CARDIAC: no chest pain,  or palpitations, complains of lower extremity swelling GI: no abdominal pain, diarrhea, constipation, heart burn, nausea or vomiting  PHYSICAL EXAMINATION  VS:  See vital signs section  GENERAL: no acute distress, normal body habitus NECK: supple, trachea midline, no neck masses, no thyroid tenderness, no thyromegaly RESPIRATORY: breathing is even & unlabored, BS CTAB CARDIAC: RRR, no murmur,no extra heart sounds, +1 bilateral lower extremity edema GI: abdomen soft, normal BS, no masses, no tenderness, no hepatomegaly, no splenomegaly PSYCHIATRIC: the patient is alert & oriented to person, affect & behavior appropriate  LABS/RADIOLOGY: 5-15 WBC 12.3, hemoglobin 10.4, MCV 117,  platelet 259, BUN 30, creatinine 6.1, alkaline phosphatase 18 otherwise CMP normal, vitamin B12 level 618, folate 14.7, vitamin D level 33.2 4-15 hemoglobin 9.6, MCV 106 otherwise CBC normal, creatinine 4.75 otherwise CMP normal, vitamin B12 level 771, folate greater than 19.9, vitamin D level 39.5 8/14 AP 130 ow liver profile nl  7/14 wbc 6.3, Hb 10.9, mcv 101, plt 406, cr 6.16, Ca 10.8 ow bmp nl, vitamin B12 level 1259, folate 16.6  ASSESSMENT/PLAN:  Hypothyroidism-uncontrolled.  levothyroxine was increased. Recheck TSH is pending. Renovascular HTN-well controlled. Constipation -well controlled. Anemia of chronic kidney dz-stable ESRD-on HD Depression-continue Remeron Hyperphosphatemia-on PhosLo  CPT CODE: 93903  Edgar Frisk. Durwin Reges, [place] [phone]

## 2013-12-12 ENCOUNTER — Non-Acute Institutional Stay (SKILLED_NURSING_FACILITY): Payer: Medicare Other | Admitting: Internal Medicine

## 2013-12-12 DIAGNOSIS — E039 Hypothyroidism, unspecified: Secondary | ICD-10-CM

## 2013-12-18 ENCOUNTER — Encounter: Payer: Self-pay | Admitting: Vascular Surgery

## 2013-12-19 ENCOUNTER — Ambulatory Visit (INDEPENDENT_AMBULATORY_CARE_PROVIDER_SITE_OTHER): Payer: Medicare Other | Admitting: Vascular Surgery

## 2013-12-19 ENCOUNTER — Encounter: Payer: Self-pay | Admitting: Vascular Surgery

## 2013-12-19 ENCOUNTER — Ambulatory Visit (INDEPENDENT_AMBULATORY_CARE_PROVIDER_SITE_OTHER)
Admission: RE | Admit: 2013-12-19 | Discharge: 2013-12-19 | Disposition: A | Discharge status: Home / Self Care | Payer: Medicare Other | Source: Ambulatory Visit | Attending: Vascular Surgery | Admitting: Vascular Surgery

## 2013-12-19 ENCOUNTER — Ambulatory Visit (HOSPITAL_COMMUNITY)
Admission: RE | Admit: 2013-12-19 | Discharge: 2013-12-19 | Disposition: A | Discharge status: Home / Self Care | Payer: Medicare Other | Source: Ambulatory Visit | Attending: Vascular Surgery | Admitting: Vascular Surgery

## 2013-12-19 ENCOUNTER — Other Ambulatory Visit: Payer: Self-pay | Admitting: Vascular Surgery

## 2013-12-19 VITALS — BP 168/58 | HR 52 | Ht 62.0 in | Wt 110.0 lb

## 2013-12-19 DIAGNOSIS — L98499 Non-pressure chronic ulcer of skin of other sites with unspecified severity: Secondary | ICD-10-CM

## 2013-12-19 DIAGNOSIS — N186 End stage renal disease: Secondary | ICD-10-CM

## 2013-12-19 DIAGNOSIS — Z992 Dependence on renal dialysis: Secondary | ICD-10-CM

## 2013-12-19 DIAGNOSIS — Z01810 Encounter for preprocedural cardiovascular examination: Secondary | ICD-10-CM

## 2013-12-19 DIAGNOSIS — I998 Other disorder of circulatory system: Secondary | ICD-10-CM

## 2013-12-19 DIAGNOSIS — I739 Peripheral vascular disease, unspecified: Secondary | ICD-10-CM

## 2013-12-19 NOTE — Progress Notes (Signed)
Patient ID: Nicole [name], female   DOB: 22-Sep-1932, 80 y.o.   MRN: [id number]           PROGRESS NOTE  DATE: 12/12/2013           FACILITY:  [facility]  LEVEL OF CARE: SNF (31)  Acute Visit  CHIEF COMPLAINT:  Manage hypothyroidism.    HISTORY OF PRESENT ILLNESS: I was requested by the staff to assess the patient regarding above problem(s):  HYPOTHYROIDISM: The hypothyroidism is unstable. No complications noted from the medications presently being used.  The patient denies fatigue or constipation.  Last TSH:  On 12/10/2013:  TSH 0.327.    PAST MEDICAL HISTORY : Reviewed.  No changes/see problem list  CURRENT MEDICATIONS: Reviewed per MAR/see medication list  REVIEW OF SYSTEMS:  GENERAL: no change in appetite, no fatigue, no weight changes, no fever, chills or weakness RESPIRATORY: no cough, SOB, DOE,, wheezing, hemoptysis CARDIAC: no chest pain, edema or palpitations GI: no abdominal pain, diarrhea, constipation, heart burn, nausea or vomiting  PHYSICAL EXAMINATION  VS: see VS section  GENERAL: no acute distress, normal body habitus NECK: supple, trachea midline, no neck masses, no thyroid tenderness, no thyromegaly RESPIRATORY: breathing is even & unlabored, BS CTAB CARDIAC: RRR, no murmur,no extra heart sounds, no edema GI: abdomen soft, normal BS, no masses, no tenderness, no hepatomegaly, no splenomegaly PSYCHIATRIC: the patient is alert & oriented to person, affect & behavior appropriate  ASSESSMENT/PLAN:  Hypothyroidism.  Unstable problem.  Patient is overtreated.  Decrease levothyroxine to 150 mcg q.d.  Check TSH in six weeks.    CPT CODE: 71062            Gayani Y Dasanayaka, [place] [phone]

## 2013-12-19 NOTE — Progress Notes (Signed)
[facility] [place]  Patient name: Nicole [name] MRN: [id number] DOB: 07-Jul-1932 Sex: female  REASON FOR VISIT: Evaluate for new hemodialysis access.  HPI: Nicole [name] is a 80 y.o. female who was last seen in our office by Dr. Oneida Alar on 07/19/2013. She had an attempted arteriogram to workup rest pain of her right foot but was unable to cooperate with the exam. She apparently subsequently occluded the left limb of her aortobifemoral bypass graft, and underwent a thrombectomy on the left. She does have a history of significant dementia. When she was seen last she had discoloration of several toes of the right foot. She had been referred by the dialysis center for evaluation for placement of new access. She previously had bilateral forearm grafts. Dr. Oneida Alar did not think she was a candidate for revascularization of the lower extremities and offered her a right above-the-knee amputation for pain control. Also, at that time, the patient was not willing to consider new hemodialysis access. In addition, Dr. Oneida Alar did not feel that it would be wise to place new access until the leg issues have been addressed.  I have reviewed all of her records. According to Dr. Stephens Shire notes, she has a left brachiocephalic vein stenosis. This reason she does not appear to be a candidate for a fistula or graft in the left arm. On the right side she had previously placed radiocephalic fistula ligated. The artery was noted to be significantly diseased and the basilic vein was felt to be too small for fistula as was the upper arm cephalic vein. Therefore a right forearm graft was placed.  Currently she denies any problems with her right IJ tunneled dialysis catheter.  Past Medical History  Diagnosis Date  . Hypertension   . ESRD (end stage renal disease)   . Peripheral vascular disease     revascularization appx. 14 years ago  . Hypothyroidism   . Hiatal hernia   . Iron deficiency anemia   .  Osteoarthritis   . Depression with anxiety   . Pulmonary embolism   . AAA (abdominal aortic aneurysm) 1999    aortic byfem bypass  . Anemia in chronic kidney disease(285.21)   . Spinal stenosis, lumbar region, without neurogenic claudication   . Other specified disease of white blood cells   . Hypopotassemia   . Other abnormal blood chemistry   . Macrocytosis   . Depression   . Anxiety   . Bradycardia   . H/O prolonged Q-T interval on ECG     Referral to EP   No family history on file. SOCIAL HISTORY: History  Substance Use Topics  . Smoking status: Former Smoker -- 0.30 packs/day for 45 years    Quit date: 01/07/2012  . Smokeless tobacco: Not on file  . Alcohol Use: No   No Known Allergies Current Outpatient Prescriptions  Medication Sig Dispense Refill  . acetaminophen (TYLENOL) 325 MG tablet Take 650 mg by mouth every 4 (four) hours as needed for mild pain.       Marland Kitchen amLODipine (NORVASC) 10 MG tablet Take 10 mg by mouth at bedtime.      . calcium acetate (PHOSLO) 667 MG capsule Take 2,001 mg by mouth 3 (three) times daily with meals.       . calcium acetate (PHOSLO) 667 MG capsule Take 667 mg by mouth with snacks.      . docusate sodium (COLACE) 100 MG capsule Take 100 mg by mouth 2 (two) times  daily as needed for mild constipation.      Marland Kitchen doxycycline (VIBRA-TABS) 100 MG tablet Take 100 mg by mouth 2 (two) times daily. For 10 days started 06/13/13      . hydrALAZINE (APRESOLINE) 25 MG tablet Take 25 mg by mouth See admin instructions. Take 25 mg twice daily after dialysis days on Tuesday Thursday and Saturday      . HYDROcodone-acetaminophen (NORCO) 10-325 MG per tablet Take one tablet by mouth every four hours around the clock for pain  180 tablet  0  . levothyroxine (SYNTHROID, LEVOTHROID) 100 MCG tablet Take 100 mcg by mouth daily before breakfast.      . lisinopril (PRINIVIL,ZESTRIL) 40 MG tablet Take 40 mg by mouth at bedtime.      . mirtazapine (REMERON) 15 MG tablet Take  15 mg by mouth at bedtime.      . Multiple Vitamins-Minerals (CERTA-VITE PO) Take 1 tablet by mouth daily.      Marland Kitchen oxyCODONE (OXY IR/ROXICODONE) 5 MG immediate release tablet Take 1 tablet (5 mg total) by mouth every 4 (four) hours as needed for severe pain.  180 tablet  0  . polyethylene glycol (MIRALAX / GLYCOLAX) packet Take 17 g by mouth daily as needed for mild constipation or moderate constipation.      . traMADol (ULTRAM) 50 MG tablet Take 50 mg by mouth 3 (three) times daily.       No current facility-administered medications for this visit.   REVIEW OF SYSTEMS: Valu.Nieves ] denotes positive finding; [  ] denotes negative finding  CARDIOVASCULAR:  [ ]  chest pain   [ ]  chest pressure   [ ]  palpitations   [ ]  orthopnea   Valu.Nieves ] dyspnea on exertion   [ ]  claudication   [ ]  rest pain   [ ]  DVT   [ ]  phlebitis PULMONARY:   [ ]  productive cough   [ ]  asthma   [ ]  wheezing NEUROLOGIC:   [ ]  weakness  [ ]  paresthesias  [ ]  aphasia  [ ]  amaurosis  [ ]  dizziness HEMATOLOGIC:   [ ]  bleeding problems   [ ]  clotting disorders MUSCULOSKELETAL:  Valu.Nieves ] joint pain   [ ]  joint swelling [ ]  leg swelling GASTROINTESTINAL: [ ]   blood in stool  [ ]   hematemesis GENITOURINARY:  [ ]   dysuria  [ ]   hematuria PSYCHIATRIC:  [ ]  history of major depression INTEGUMENTARY:  [ ]  rashes  [ ]  ulcers CONSTITUTIONAL:  [ ]  fever   [ ]  chills  PHYSICAL EXAM: Filed Vitals:   12/19/13 1116  BP: 168/58  Pulse: 52  Height: 5\' 2"  (1.575 m)  Weight: 110 lb (49.896 kg)  SpO2: 100%   Body mass index is 20.11 kg/(m^2). GENERAL: The patient is a well-nourished female, in no acute distress. The vital signs are documented above. CARDIOVASCULAR: There is a regular rate and rhythm. I cannot palpate a right radial pulse. She has a diminished left radial pulse. She has palpable femoral pulses PULMONARY: There is good air exchange bilaterally without wheezing or rales. ABDOMEN: Soft and non-tender with normal pitched bowel sounds.    MUSCULOSKELETAL: There are no major deformities or cyanosis. NEUROLOGIC: No focal weakness or paresthesias are detected. 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 PSYCHIATRIC: The patient has a normal affect.  DATA:  I reviewed her fistulogram which was done by [facility]. This was a procedure on the right forearm  graft which is now occluded the  Her upper extremity arterial Doppler study today shows monophasic Doppler signals in both upper extremities.  MEDICAL ISSUES: ESRD (end stage renal disease) on dialysis The patient currently has a functioning right IJ tunneled dialysis catheter. She has a known left brachiocephalic vein stenosis and is therefore not a candidate for access in the left arm because of the risk of venous hypertension. In addition she has evidence of arterial insufficiency with monophasic Doppler signals in the right wrist and would be at risk for steal. On the left side she had no adequate veins for a fistula. Her only remaining option on the right would be a right upper arm graft. However, I would agree with Dr. Oneida Alar that it would not be wise to place a prosthetic graft in the right arm given the gangrene of her right foot. He had previously discussed with her the option of a primary amputation on the right her she is not agreeable to this. If ultimately the foot progresses and she is agreeable to amputation, once this has healed she could potentially be considered for a right upper arm graft. Is not a candidate for a thigh graft given that she has multilevel arterial occlusive disease in the lower extremities bilaterally. For all these reasons currently I would continue to use her tunneled dialysis catheter.       [place] [facility] [place]: [phone]

## 2013-12-19 NOTE — Assessment & Plan Note (Signed)
The patient currently has a functioning right IJ tunneled dialysis catheter. She has a known left brachiocephalic vein stenosis and is therefore not a candidate for access in the left arm because of the risk of venous hypertension. In addition she has evidence of arterial insufficiency with monophasic Doppler signals in the right wrist and would be at risk for steal. On the left side she had no adequate veins for a fistula. Her only remaining option on the right would be a right upper arm graft. However, I would agree with Dr. Oneida Alar that it would not be wise to place a prosthetic graft in the right arm given the gangrene of her right foot. He had previously discussed with her the option of a primary amputation on the right her she is not agreeable to this. If ultimately the foot progresses and she is agreeable to amputation, once this has healed she could potentially be considered for a right upper arm graft. Is not a candidate for a thigh graft given that she has multilevel arterial occlusive disease in the lower extremities bilaterally. For all these reasons currently I would continue to use her tunneled dialysis catheter.

## 2013-12-26 ENCOUNTER — Non-Acute Institutional Stay (SKILLED_NURSING_FACILITY): Payer: Medicare Other | Admitting: Internal Medicine

## 2013-12-26 DIAGNOSIS — I150 Renovascular hypertension: Secondary | ICD-10-CM

## 2013-12-26 DIAGNOSIS — K5900 Constipation, unspecified: Secondary | ICD-10-CM

## 2013-12-26 DIAGNOSIS — E039 Hypothyroidism, unspecified: Secondary | ICD-10-CM

## 2013-12-26 DIAGNOSIS — N189 Chronic kidney disease, unspecified: Secondary | ICD-10-CM

## 2013-12-26 DIAGNOSIS — D631 Anemia in chronic kidney disease: Secondary | ICD-10-CM

## 2013-12-29 NOTE — Progress Notes (Signed)
        PROGRESS NOTE  DATE: 12-26-13  FACILITY: Nursing Home Location: [facility]  LEVEL OF CARE: SNF (31)  Routine Visit  CHIEF COMPLAINT:  Manage hypothyroidism, HTN & constipation  HISTORY OF PRESENT ILLNESS:  REASSESSMENT OF ONGOING PROBLEM(S):  HYPOTHYROIDISM: The hypothyroidism remains stable. No complications noted from the medications presently being used.  The patient denies fatigue or constipation.  Last TSH 0.051 on 11-08-12, in 11-14 TSH 0.513, in 6-15 TSH 45.89, in 8-15 TSH 13.52, in 10-15 TSH 0.327.  HTN: Pt 's HTN remains stable.  Denies CP, sob, DOE, pedal edema, headaches, dizziness or visual disturbances.  No complications from the medications currently being used.  Last BP :140/70, 132/70, 110/58, 94/57, 110/70, 116/59, 131/56, 110/62, 140/76  CONSTIPATION: The constipation remains stable. No complications from the medications presently being used. Patient denies ongoing constipation, abdominal pain, nausea or vomiting.  PAST MEDICAL HISTORY : Reviewed.  No changes.  CURRENT MEDICATIONS: Reviewed per [facility]  REVIEW OF SYSTEMS:  GENERAL: no change in appetite, no fatigue, no weight changes, no fever, chills or weakness RESPIRATORY: no cough, SOB, DOE, wheezing, hemoptysis CARDIAC: no chest pain,  or palpitations, complains of lower extremity swelling GI: no abdominal pain, diarrhea, constipation, heart burn, nausea or vomiting  PHYSICAL EXAMINATION  VS:  See vital signs section  GENERAL: no acute distress, normal body habitus NECK: supple, trachea midline, no neck masses, no thyroid tenderness, no thyromegaly RESPIRATORY: breathing is even & unlabored, BS CTAB CARDIAC: RRR, no murmur,no extra heart sounds, +1 bilateral lower extremity edema GI: abdomen soft, normal BS, no masses, no tenderness, no hepatomegaly, no splenomegaly PSYCHIATRIC: the patient is alert & oriented to person, affect & behavior appropriate  LABS/RADIOLOGY: 5-15 WBC  12.3, hemoglobin 10.4, MCV 117, platelet 259, BUN 30, creatinine 6.1, alkaline phosphatase 18 otherwise CMP normal, vitamin B12 level 618, folate 14.7, vitamin D level 33.2 4-15 hemoglobin 9.6, MCV 106 otherwise CBC normal, creatinine 4.75 otherwise CMP normal, vitamin B12 level 771, folate greater than 19.9, vitamin D level 39.5 8/14 AP 130 ow liver profile nl  7/14 wbc 6.3, Hb 10.9, mcv 101, plt 406, cr 6.16, Ca 10.8 ow bmp nl, vitamin B12 level 1259, folate 16.6  ASSESSMENT/PLAN:  Hypothyroidism-uncontrolled.  levothyroxine was decreased. Recheck TSH is pending. Renovascular HTN-BP borderline Constipation -well controlled. Anemia of chronic kidney dz-stable ESRD-on HD Depression-continue Remeron Hyperphosphatemia-on PhosLo  CPT CODE: 46270  Edgar Frisk. Durwin Reges, [place] [phone]

## 2014-01-01 ENCOUNTER — Non-Acute Institutional Stay (SKILLED_NURSING_FACILITY): Payer: Medicare Other | Admitting: Internal Medicine

## 2014-01-01 DIAGNOSIS — Z992 Dependence on renal dialysis: Secondary | ICD-10-CM

## 2014-01-01 DIAGNOSIS — G253 Myoclonus: Secondary | ICD-10-CM

## 2014-01-01 DIAGNOSIS — N186 End stage renal disease: Secondary | ICD-10-CM

## 2014-01-02 ENCOUNTER — Non-Acute Institutional Stay (SKILLED_NURSING_FACILITY): Payer: Medicare Other | Admitting: Internal Medicine

## 2014-01-02 DIAGNOSIS — N189 Chronic kidney disease, unspecified: Secondary | ICD-10-CM

## 2014-01-02 DIAGNOSIS — E039 Hypothyroidism, unspecified: Secondary | ICD-10-CM

## 2014-01-02 DIAGNOSIS — D631 Anemia in chronic kidney disease: Secondary | ICD-10-CM

## 2014-01-03 NOTE — Progress Notes (Signed)
Patient ID: Nicole [name], female   DOB: 02-17-1933, 80 y.o.   MRN: [id number]           PROGRESS NOTE  DATE: 01/02/2014         FACILITY:  [facility]  LEVEL OF CARE: SNF (31)  Acute Visit  CHIEF COMPLAINT:  Manage anemia of chronic kidney disease and hypothyroidism.    HISTORY OF PRESENT ILLNESS: I was requested by the staff to assess the patient regarding above problem(s):  ANEMIA: The anemia is unstable. The patient denies fatigue, melena or hematochezia. No complications from the medications currently being used.  On 12/28/2013:  Hemoglobin 9.3, MCV 100.  In 07/2013:  Hemoglobin 10.4.     HYPOTHYROIDISM: The hypothyroidism is unstable. No complications noted from the medications presently being used.  The patient denies fatigue or constipation.  Last TSH:  On 12/29/2013:  TSH 0.401.    PAST MEDICAL HISTORY : Reviewed.  No changes/see problem list  CURRENT MEDICATIONS: Reviewed per MAR/see medication list  REVIEW OF SYSTEMS:  GENERAL: no change in appetite, no fatigue, no weight changes, no fever, chills or weakness RESPIRATORY: no cough, SOB, DOE,, wheezing, hemoptysis CARDIAC: no chest pain, edema or palpitations GI: no abdominal pain, diarrhea, constipation, heart burn, nausea or vomiting  PHYSICAL EXAMINATION  VS: see VS section  GENERAL: no acute distress, normal body habitus NECK: supple, trachea midline, no neck masses, no thyroid tenderness, no thyromegaly RESPIRATORY: breathing is even & unlabored, BS CTAB CARDIAC: RRR, no murmur,no extra heart sounds, no edema GI: abdomen soft, normal BS, no masses, no tenderness, no hepatomegaly, no splenomegaly PSYCHIATRIC: the patient is alert & oriented to person, affect & behavior appropriate  ASSESSMENT/PLAN:  Anemia of chronic kidney disease.  Unstable problem.  Hemoglobin declined.  We will monitor.    Hypothyroidism.  Unstable.  Levothyroxine was decreased.  Recheck TSH in six weeks is pending.      CPT CODE: 79150          Gayani Y Dasanayaka, [place] [phone]

## 2014-01-07 NOTE — Progress Notes (Addendum)
Patient ID: Nicole [name], female   DOB: 04-02-1932, 80 y.o.   MRN: [id number]               PROGRESS NOTE  DATE:  01/01/2014    FACILITY: [facility]    LEVEL OF CARE:   SNF   Acute Visit   CHIEF COMPLAINT:  Shaking movements this morning before dialysis.    HISTORY OF PRESENT ILLNESS:  Apparently, this patient was observed to have bilateral upper extremity shaking movements sometime this morning.  She was seen by her nephrologist at dialysis this morning and she came back with orders to stop her routine Norco that she gets around-the-clock, 10/325.  They also wanted her Remeron to stop.  They left suggestions for Tylenol for pain.    In discussing with the patient, she states that she had uncontrolled tremors in her arms.  This was even before she got on dialysis.  She was awake and could remember this, but could not stop.  I did not see this, nor do I see any good description.    The patient has continued problems with ischemic changes in her foot, although I have not seen this in quite some time.  She has significant PAD.  Has a prior thrombectomy, but was not felt to be a candidate for any further revascularization.  Nevertheless, she is not really describing pain at the moment and it is now late in the day.      PHYSICAL EXAMINATION:   GENERAL APPEARANCE:  The patient appears to be in no distress.  Talks coherently.   CARDIOVASCULAR:  CARDIAC:  She has a grade 2/6 pansystolic murmur at the lower left sternal border, compatible with tricuspid regurgitation.   CHEST/RESPIRATORY:  Air entry is clear.   GASTROINTESTINAL:  ABDOMEN:   No tenderness.   CIRCULATION:  EDEMA/VARICOSITIES:  Extremities:  No edema.   ARTERIAL:  However, she has very marked absence of her peripheral pulses.  I do not really feel anything at the popliteal.    ASSESSMENT/PLAN:    ?Myoclonic jerking, even before dialysis this morning.  I was concerned about abrupt withdrawal of her Norco that was being  given around-the-clock.  However, the patient seems quite stable at the moment and is not really complaining of pain.  I am going to leave her Tylenol Extra-Strength in case there is discomfort.    I will try to have a look at her foot later on in the week.

## 2014-01-10 IMAGING — XA IR SHUNTOGRAM/ FISTULAGRAM *R*
1 series · 13 of 24 positions shown · non-contrast
Comparison: none

CLINICAL DATA: End-stage renal disease

AV SHUNTOGRAM :
Procedure: The right forearm was prepped and draped in a sterile
fashion.  An 18 gauge Angiocath was inserted into the AV graft.
Contrast was injected.  No complication.

[Series 1: run · 13 of 55 slices shown]
[im 1/55]
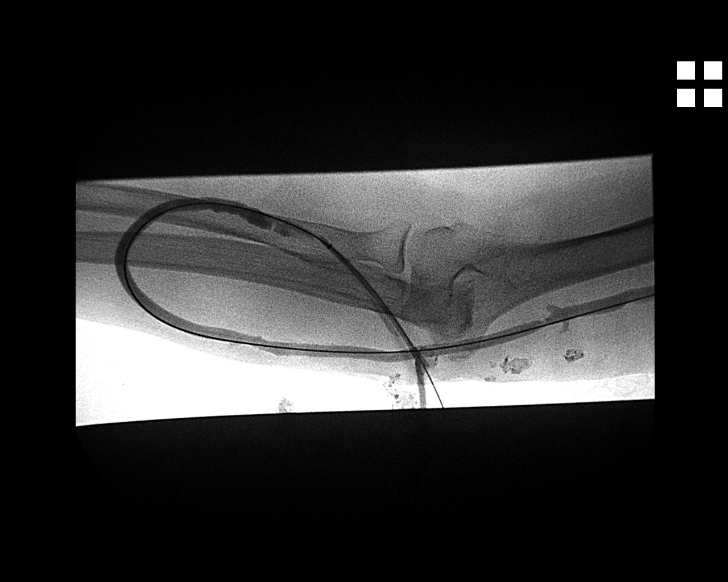
[im 5/55]
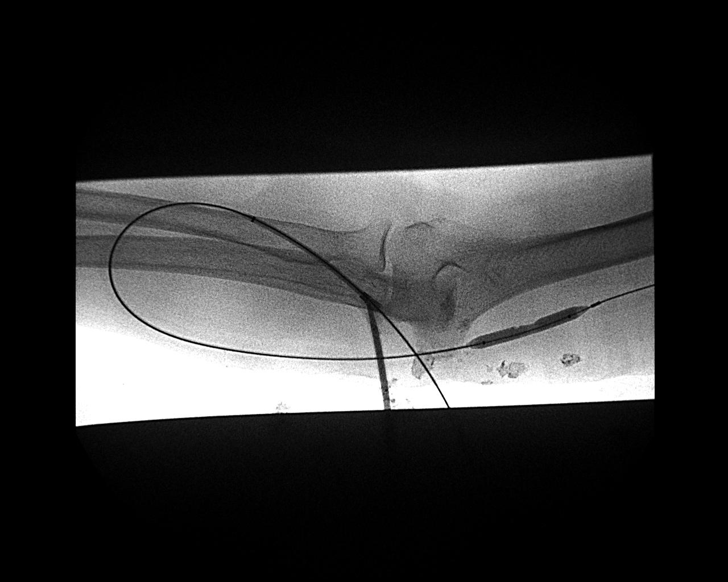
[im 10/55]
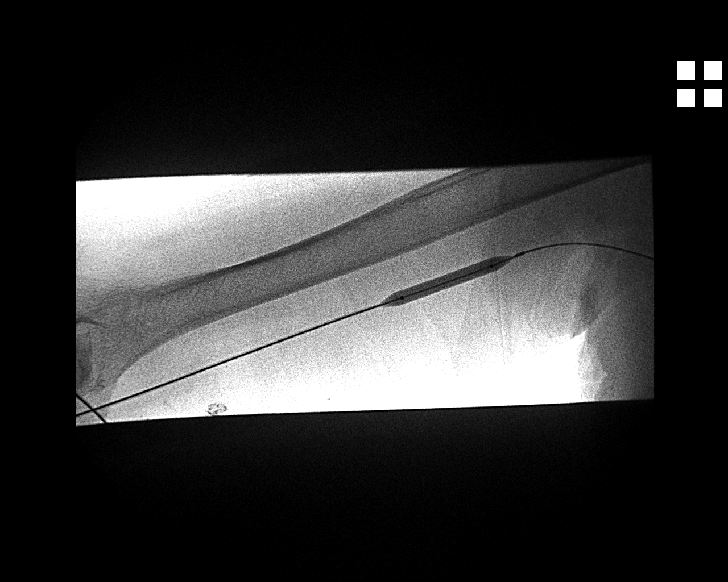
[im 15/55]
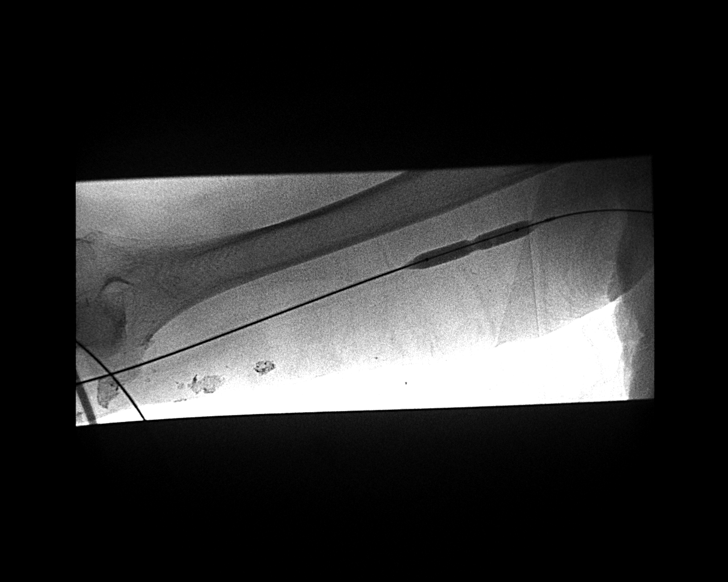
[im 19/55]
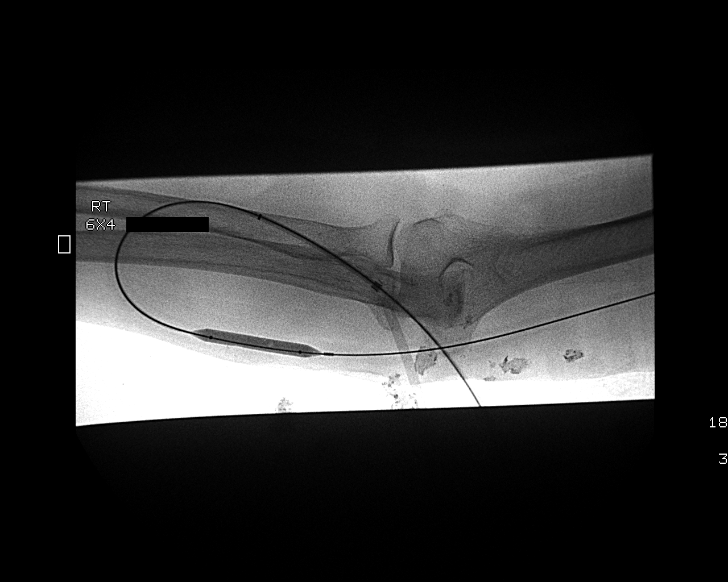
[im 24/55]
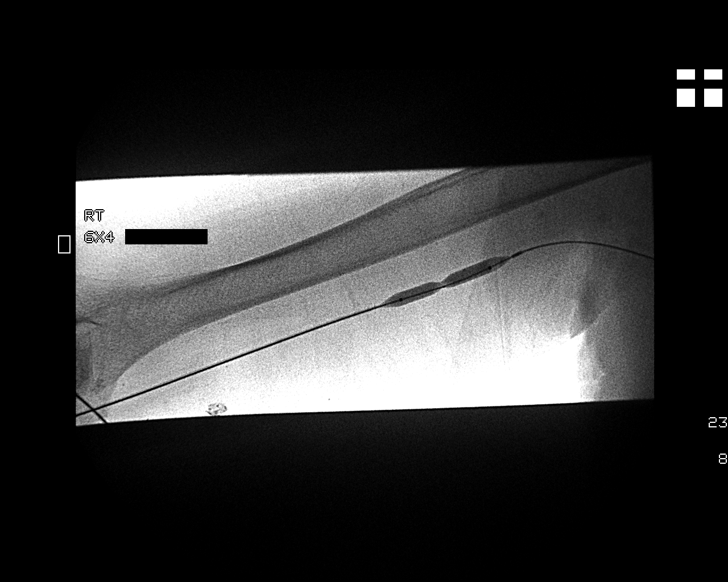
[im 29/55]
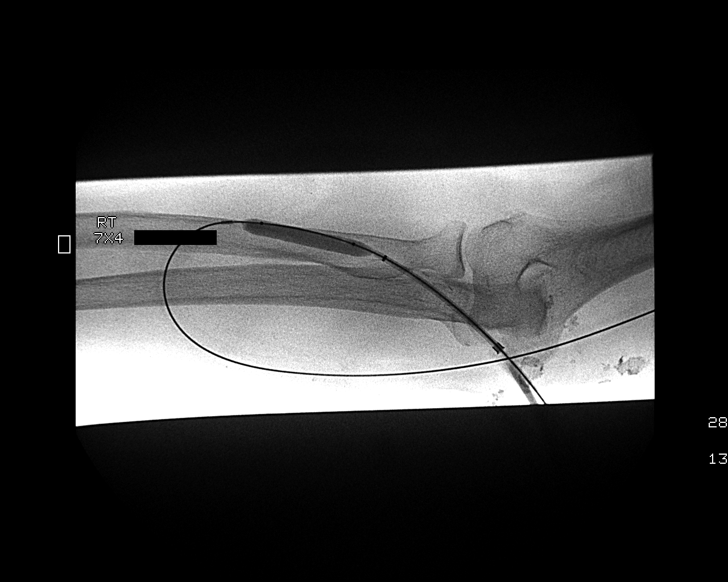
[im 31/55]
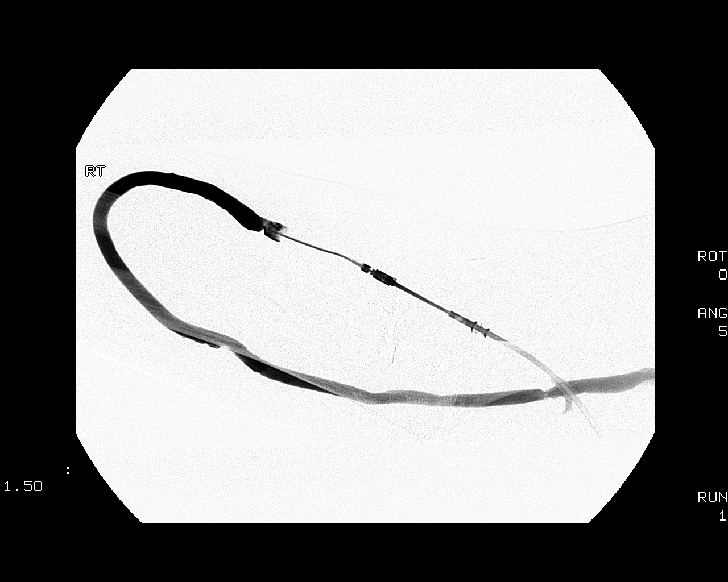
[im 36/55]
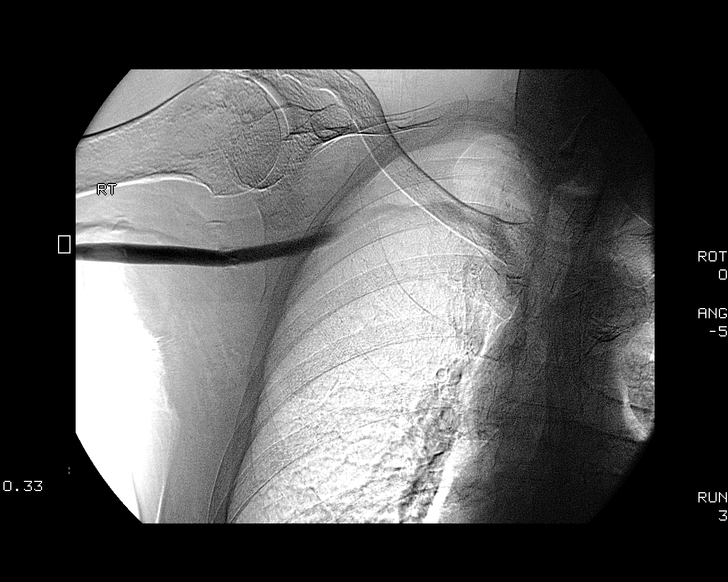
[im 40/55]
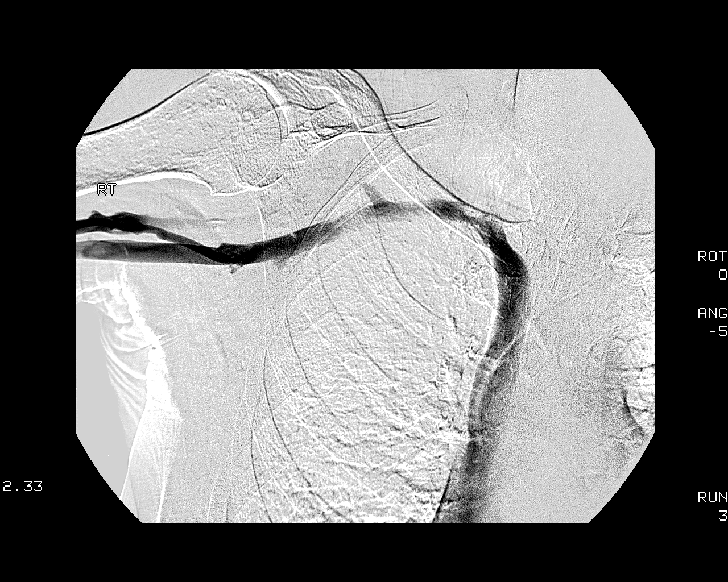
[im 45/55]
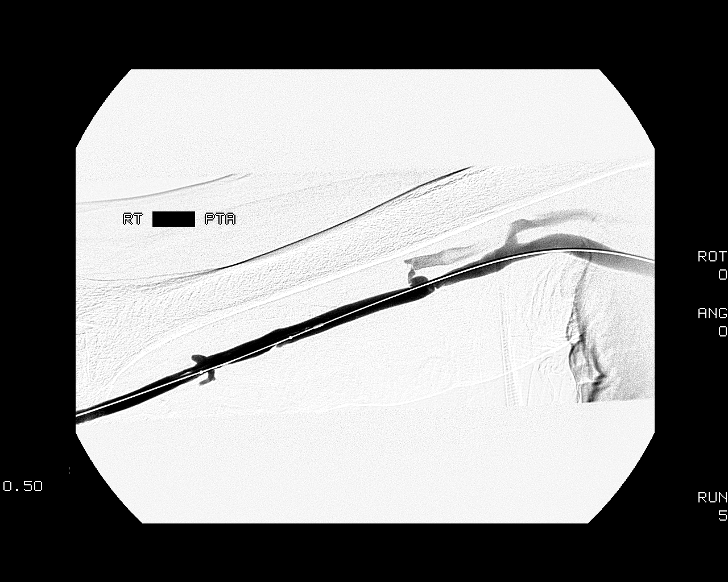
[im 50/55]
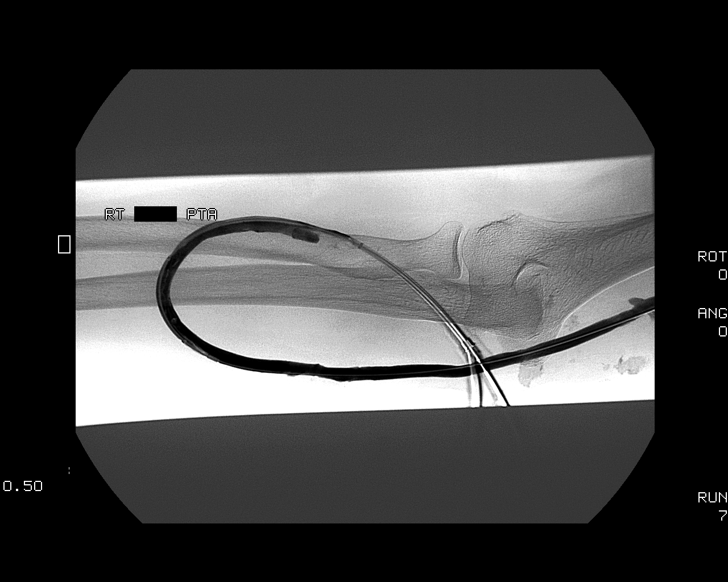
[im 55/55]
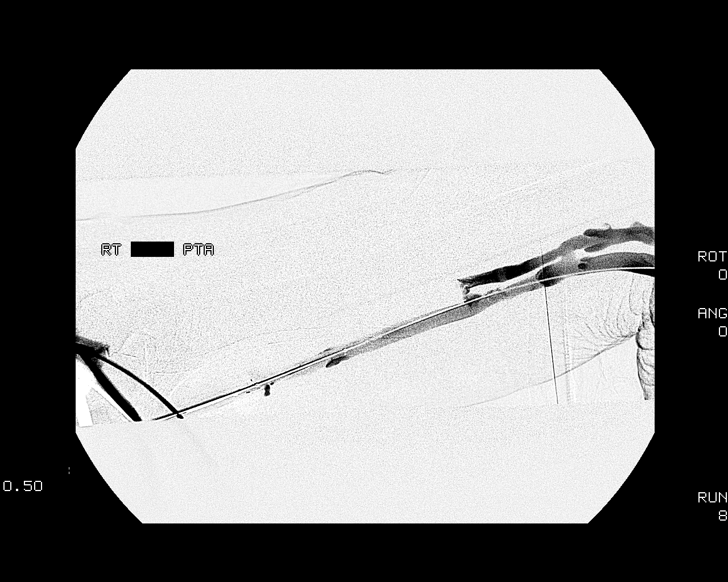

[13 of 24 positions shown; findings below may reference images not displayed]

FINDINGS: Multiple graft stenoses.  Multiple outflow vein stenoses.
Central venous structures and arterial anastomosis are widely
patent.
IMPRESSION: Graft and outflow vein stenoses are present.

VENOUS PTA

Procedure:  The 18 gauge Angiocath was exchanged over Jashmin Stat for
a 6 French sheath. The multiple graft and outflow arm vein stenoses
were dilated to 6 mm.  Repeat imaging was performed.  They were
then dilated to 7 mm. Repeat imaging was performed. The sheath was
removed and hemostasis was achieved with a 0 Prolene figure-of-
eight stitch.  No complications.
FINDINGS: After dilatation to 6 mm, there is some improvement but
residual narrowing was present.  After dilatation to 7 mm, the
graft and outflow vein were noted be widely patent.
IMPRESSION: Successful dilatation of graft and outflow vein stenoses to 7 mm.

## 2014-02-14 ENCOUNTER — Encounter (HOSPITAL_COMMUNITY): Payer: Self-pay | Admitting: Surgery

## 2014-02-18 ENCOUNTER — Encounter: Payer: Self-pay | Admitting: Vascular Surgery

## 2014-02-19 ENCOUNTER — Encounter: Payer: Self-pay | Admitting: Vascular Surgery

## 2014-02-20 ENCOUNTER — Encounter: Payer: Self-pay | Admitting: Vascular Surgery

## 2014-02-20 ENCOUNTER — Ambulatory Visit (INDEPENDENT_AMBULATORY_CARE_PROVIDER_SITE_OTHER): Payer: Medicare Other | Admitting: Vascular Surgery

## 2014-02-20 VITALS — BP 119/60 | HR 72 | Resp 18 | Ht 62.0 in | Wt 111.0 lb

## 2014-02-20 DIAGNOSIS — I70269 Atherosclerosis of native arteries of extremities with gangrene, unspecified extremity: Secondary | ICD-10-CM | POA: Diagnosis not present

## 2014-02-20 DIAGNOSIS — I739 Peripheral vascular disease, unspecified: Secondary | ICD-10-CM

## 2014-02-20 DIAGNOSIS — L98499 Non-pressure chronic ulcer of skin of other sites with unspecified severity: Secondary | ICD-10-CM

## 2014-02-20 NOTE — Progress Notes (Signed)
Patient is a 80 year old female referred by Dr. Justin Mend today for evaluation of gangrene right foot. The patient previously has been deemed not a revascularization candidate due to her essentially nonambulatory status as well as dementia and inability to tolerate an arteriogram procedure. She has some pain in the right foot but is able to take 1 or 2 pain pills each evening and this is controlled. She denies any fever or chills. She has no significant drainage from the foot. She appears had aortobifemoral bypass. At her arteriogram she occluded the left limb of her aortobifem and required emergent thrombectomy of the left limb of her aortobifem. She has end-stage renal disease and is on dialysis. She resides at a skilled nursing facility. Other chronic medical problems include spinal stenosis depression and anxiety all of which are currently stable.  Past Medical History  Diagnosis Date  . Hypertension   . ESRD (end stage renal disease)   . Peripheral vascular disease     revascularization appx. 14 years ago  . Hypothyroidism   . Hiatal hernia   . Iron deficiency anemia   . Osteoarthritis   . Depression with anxiety   . Pulmonary embolism   . AAA (abdominal aortic aneurysm) 1999    aortic byfem bypass  . Anemia in chronic kidney disease(285.21)   . Spinal stenosis, lumbar region, without neurogenic claudication   . Other specified disease of white blood cells   . Hypopotassemia   . Other abnormal blood chemistry   . Macrocytosis   . Depression   . Anxiety   . Bradycardia   . H/O prolonged Q-T interval on ECG     Referral to EP   Current Outpatient Prescriptions on File Prior to Visit  Medication Sig Dispense Refill  . acetaminophen (TYLENOL) 325 MG tablet Take 650 mg by mouth every 4 (four) hours as needed for mild pain.     Marland Kitchen amLODipine (NORVASC) 10 MG tablet Take 10 mg by mouth at bedtime.    . calcium acetate (PHOSLO) 667 MG capsule Take 2,001 mg by mouth 3 (three) times daily with  meals.     . calcium acetate (PHOSLO) 667 MG capsule Take 667 mg by mouth with snacks.    . docusate sodium (COLACE) 100 MG capsule Take 100 mg by mouth 2 (two) times daily as needed for mild constipation.    Marland Kitchen doxycycline (VIBRA-TABS) 100 MG tablet Take 100 mg by mouth 2 (two) times daily. For 10 days started 06/13/13    . hydrALAZINE (APRESOLINE) 25 MG tablet Take 25 mg by mouth See admin instructions. Take 25 mg twice daily after dialysis days on Tuesday Thursday and Saturday    . HYDROcodone-acetaminophen (NORCO) 10-325 MG per tablet Take one tablet by mouth every four hours around the clock for pain 180 tablet 0  . levothyroxine (SYNTHROID, LEVOTHROID) 100 MCG tablet Take 100 mcg by mouth daily before breakfast.    . lisinopril (PRINIVIL,ZESTRIL) 40 MG tablet Take 40 mg by mouth at bedtime.    . mirtazapine (REMERON) 15 MG tablet Take 15 mg by mouth at bedtime.    . Multiple Vitamins-Minerals (CERTA-VITE PO) Take 1 tablet by mouth daily.    Marland Kitchen oxyCODONE (OXY IR/ROXICODONE) 5 MG immediate release tablet Take 1 tablet (5 mg total) by mouth every 4 (four) hours as needed for severe pain. 180 tablet 0  . polyethylene glycol (MIRALAX / GLYCOLAX) packet Take 17 g by mouth daily as needed for mild constipation or moderate constipation.    Marland Kitchen  traMADol (ULTRAM) 50 MG tablet Take 50 mg by mouth 3 (three) times daily.     No current facility-administered medications on file prior to visit.    Physical exam:  Filed Vitals:   02/20/14 1053  BP: 119/60  Pulse: 72  Resp: 18  Height: 5\' 2"  (1.575 m)  Weight: 111 lb (50.349 kg)    Right lower extremity: Dry gangrenous changes toes 2 and 3 right foot, no palpable pedal pulses  Assessment:  Patient with dementia not a revascularization candidate. The patient was previously offered a right above-knee amputation and refused. I again offered the patient a right above-knee amputation today. She is still reluctant to undergo this procedure. She currently has  dry gangrenous changes and the pain in her foot is controlled with minimal oral pain medication. She does not need urgent amputation as she does not have active ongoing wet gangrene or ascending infection or unrelenting pain. I discussed with the patient today the possibility of an above-knee amputation versus continued pain medication for symptomatic control.  Plan: The patient is currently opted for conservative management with oral pain medication control as well. I discussed with her that if the pain becomes worse she would need a right above-knee amputation. I also told her that if she changes her mind we could offer her a right above-knee amputation at any point. She can follow-up at any time if she wishes a right above-knee amputation. However that is the only operation I would offer her. She is not a revascularization candidate. There is no need for further follow-up unless she wishes to have a right above-knee amputation.  Ruta Hinds, MD [facility] Office: [phone] Pager: [phone]

## 2014-03-20 ENCOUNTER — Telehealth: Payer: Self-pay | Admitting: Vascular Surgery

## 2014-03-20 NOTE — Telephone Encounter (Signed)
-----   Message from Sherrye Payor, RN sent at 03/19/2014 12:59 PM EST ----- Regarding: needs ov with [facility] This pt. needs an OV with Dr. Oneida Alar to discuss right AKA.  She dialyzes on T-Th-Sat in AM, so will need an afternoon OV.

## 2014-03-20 NOTE — Telephone Encounter (Signed)
LM for [employer], ddp

## 2014-04-03 ENCOUNTER — Encounter: Payer: Self-pay | Admitting: Vascular Surgery

## 2014-04-04 ENCOUNTER — Ambulatory Visit: Payer: Medicare Other | Admitting: Vascular Surgery

## 2014-04-10 ENCOUNTER — Encounter: Payer: Self-pay | Admitting: Vascular Surgery

## 2014-04-11 ENCOUNTER — Ambulatory Visit: Payer: Medicare Other | Admitting: Vascular Surgery

## 2014-04-16 ENCOUNTER — Telehealth: Payer: Self-pay | Admitting: Vascular Surgery

## 2014-04-16 NOTE — Telephone Encounter (Signed)
Nicole [name] is already scheduled for 04/24/14.

## 2014-04-16 NOTE — Telephone Encounter (Signed)
-----   Message from Sherrye Payor, RN sent at 04/16/2014 12:30 PM EST ----- Regarding: NEEDS APPT. WITH [facility] Rec'd phone call from [place], [place] for [facility]  He said this pt. Is ready to schedule her right leg amputation.  She needs an OV with Dr. Oneida Alar to discuss right AKA. She dialyzes on T-Th-Sat in AM, so will need an afternoon OV.  Thank you.

## 2014-04-23 ENCOUNTER — Encounter: Payer: Self-pay | Admitting: Vascular Surgery

## 2014-04-24 ENCOUNTER — Ambulatory Visit (INDEPENDENT_AMBULATORY_CARE_PROVIDER_SITE_OTHER): Payer: Medicare Other | Admitting: Vascular Surgery

## 2014-04-24 ENCOUNTER — Encounter: Payer: Self-pay | Admitting: Vascular Surgery

## 2014-04-24 VITALS — BP 146/75 | HR 84 | Resp 16 | Ht 62.0 in | Wt 114.0 lb

## 2014-04-24 DIAGNOSIS — I70269 Atherosclerosis of native arteries of extremities with gangrene, unspecified extremity: Secondary | ICD-10-CM | POA: Diagnosis not present

## 2014-04-24 NOTE — Progress Notes (Signed)
Patient is a 81 year old female referred  for evaluation of gangrene right foot. The patient previously has been deemed not a revascularization candidate due to her essentially nonambulatory status as well as dementia and inability to tolerate an arteriogram procedure. She has some pain in the right foot but is able to take 1 or 2 pain pills each evening and this is controlled. She denies any fever or chills. She has no significant drainage from the foot. She previously had aortobifemoral bypass. At her arteriogram she occluded the left limb of her aortobifem and required emergent thrombectomy of the left limb of her aortobifem. She has end-stage renal disease and is on dialysis. Her dialysis today is Tuesday Thursday Saturday. She resides at a skilled nursing facility. Other chronic medical problems include spinal stenosis depression and anxiety all of which are currently stable. She basically sits either on the side of the bed or in her wheelchair all day long.    Past Medical History  Diagnosis Date  . Hypertension   . ESRD (end stage renal disease)   . Peripheral vascular disease     revascularization appx. 14 years ago  . Hypothyroidism   . Hiatal hernia   . Iron deficiency anemia   . Osteoarthritis   . Depression with anxiety   . Pulmonary embolism   . AAA (abdominal aortic aneurysm) 1999    aortic byfem bypass  . Anemia in chronic kidney disease(285.21)   . Spinal stenosis, lumbar region, without neurogenic claudication   . Other specified disease of white blood cells   . Hypopotassemia   . Other abnormal blood chemistry   . Macrocytosis   . Depression   . Anxiety   . Bradycardia   . H/O prolonged Q-T interval on ECG     Referral to EP   Current Outpatient Prescriptions on File Prior to Visit  Medication Sig Dispense Refill  . acetaminophen (TYLENOL) 325 MG tablet Take 650 mg by mouth every 4 (four) hours as needed for mild pain.     Marland Kitchen amLODipine (NORVASC) 10 MG tablet Take 10  mg by mouth at bedtime.    . calcium acetate (PHOSLO) 667 MG capsule Take 2,001 mg by mouth 3 (three) times daily with meals.     . calcium acetate (PHOSLO) 667 MG capsule Take 667 mg by mouth with snacks.    . docusate sodium (COLACE) 100 MG capsule Take 100 mg by mouth 2 (two) times daily as needed for mild constipation.    Marland Kitchen doxycycline (VIBRA-TABS) 100 MG tablet Take 100 mg by mouth 2 (two) times daily. For 10 days started 06/13/13    . hydrALAZINE (APRESOLINE) 25 MG tablet Take 25 mg by mouth See admin instructions. Take 25 mg twice daily after dialysis days on Tuesday Thursday and Saturday    . HYDROcodone-acetaminophen (NORCO) 10-325 MG per tablet Take one tablet by mouth every four hours around the clock for pain 180 tablet 0  . levothyroxine (SYNTHROID, LEVOTHROID) 100 MCG tablet Take 100 mcg by mouth daily before breakfast.    . lisinopril (PRINIVIL,ZESTRIL) 40 MG tablet Take 40 mg by mouth at bedtime.    . mirtazapine (REMERON) 15 MG tablet Take 15 mg by mouth at bedtime.    . Multiple Vitamins-Minerals (CERTA-VITE PO) Take 1 tablet by mouth daily.    Marland Kitchen oxyCODONE (OXY IR/ROXICODONE) 5 MG immediate release tablet Take 1 tablet (5 mg total) by mouth every 4 (four) hours as needed for severe pain. (Patient not taking: Reported  on 04/24/2014) 180 tablet 0  . polyethylene glycol (MIRALAX / GLYCOLAX) packet Take 17 g by mouth daily as needed for mild constipation or moderate constipation.    . traMADol (ULTRAM) 50 MG tablet Take 50 mg by mouth 3 (three) times daily.     No current facility-administered medications on file prior to visit.    Physical exam:  Filed Vitals:   04/24/14 1433  BP: 146/75  Pulse: 84  Resp: 16  Height: 5\' 2"  (1.575 m)  Weight: 114 lb (51.71 kg)    Right lower extremity: Dry gangrenous changes all 5 toes right foot, no palpable pedal pulses, 2+ femoral pulses bilaterally, left foot with dependent edema and some rubor of the toes  Chest: Clear to auscultation  bilaterally, right side dialysis catheter  Cardiac: Regular rate and rhythm  Assessment:  Patient with dementia not a revascularization candidate. The patient was previously offered a right above-knee amputation and refused. I again offered the patient a right above-knee amputation today.   Plan: At this point the patient has agreed to a right above-knee amputation. This is scheduled for 05/06/2014. Risks benefits possible complications and procedure details were explained the patient today including not limited to bleeding infection risk of nonhealing less than 1%.  Ruta Hinds, MD [facility] Office: [phone] Pager: [phone]

## 2014-04-30 ENCOUNTER — Other Ambulatory Visit: Payer: Self-pay

## 2014-05-03 ENCOUNTER — Encounter (HOSPITAL_COMMUNITY): Payer: Self-pay | Admitting: *Deleted

## 2014-05-03 NOTE — Progress Notes (Signed)
Spoke with nurse Shanti to make aware of pt arrival time of 7:00AM on Monday, 05/06/14.

## 2014-05-03 NOTE — Progress Notes (Signed)
Pre op instructions give to [facility], pts nurse.   tranportation will be nursing home.

## 2014-05-05 MED ORDER — CEFUROXIME SODIUM 1.5 G IJ SOLR
1.5000 g | INTRAMUSCULAR | Status: AC
  Administered 2014-05-06: 1.5 g via INTRAVENOUS
  Filled 2014-05-05: qty 1.5

## 2014-05-06 ENCOUNTER — Inpatient Hospital Stay (HOSPITAL_COMMUNITY): Payer: Medicare Other | Admitting: Anesthesiology

## 2014-05-06 ENCOUNTER — Inpatient Hospital Stay (HOSPITAL_COMMUNITY)
Admission: RE | Admit: 2014-05-06 | Discharge: 2014-05-10 | DRG: 239 | Disposition: A | Discharge status: Skilled Nursing Facility | Payer: Medicare Other | Source: Ambulatory Visit | Attending: Vascular Surgery | Admitting: Vascular Surgery

## 2014-05-06 ENCOUNTER — Encounter (HOSPITAL_COMMUNITY): Payer: Self-pay | Admitting: Anesthesiology

## 2014-05-06 ENCOUNTER — Encounter (HOSPITAL_COMMUNITY)
Admission: RE | Disposition: A | Discharge status: Skilled Nursing Facility | Payer: Self-pay | Source: Ambulatory Visit | Attending: Vascular Surgery

## 2014-05-06 DIAGNOSIS — Z86711 Personal history of pulmonary embolism: Secondary | ICD-10-CM | POA: Diagnosis not present

## 2014-05-06 DIAGNOSIS — Z6820 Body mass index (BMI) 20.0-20.9, adult: Secondary | ICD-10-CM | POA: Diagnosis not present

## 2014-05-06 DIAGNOSIS — R011 Cardiac murmur, unspecified: Secondary | ICD-10-CM | POA: Diagnosis present

## 2014-05-06 DIAGNOSIS — N2581 Secondary hyperparathyroidism of renal origin: Secondary | ICD-10-CM | POA: Diagnosis present

## 2014-05-06 DIAGNOSIS — Z87891 Personal history of nicotine dependence: Secondary | ICD-10-CM

## 2014-05-06 DIAGNOSIS — R627 Adult failure to thrive: Secondary | ICD-10-CM | POA: Diagnosis present

## 2014-05-06 DIAGNOSIS — D631 Anemia in chronic kidney disease: Secondary | ICD-10-CM | POA: Diagnosis present

## 2014-05-06 DIAGNOSIS — N186 End stage renal disease: Secondary | ICD-10-CM | POA: Diagnosis present

## 2014-05-06 DIAGNOSIS — Z79891 Long term (current) use of opiate analgesic: Secondary | ICD-10-CM | POA: Diagnosis not present

## 2014-05-06 DIAGNOSIS — E119 Type 2 diabetes mellitus without complications: Secondary | ICD-10-CM | POA: Diagnosis present

## 2014-05-06 DIAGNOSIS — E039 Hypothyroidism, unspecified: Secondary | ICD-10-CM | POA: Diagnosis present

## 2014-05-06 DIAGNOSIS — F0390 Unspecified dementia without behavioral disturbance: Secondary | ICD-10-CM | POA: Diagnosis present

## 2014-05-06 DIAGNOSIS — I120 Hypertensive chronic kidney disease with stage 5 chronic kidney disease or end stage renal disease: Secondary | ICD-10-CM | POA: Diagnosis present

## 2014-05-06 DIAGNOSIS — M1990 Unspecified osteoarthritis, unspecified site: Secondary | ICD-10-CM | POA: Diagnosis present

## 2014-05-06 DIAGNOSIS — Z79899 Other long term (current) drug therapy: Secondary | ICD-10-CM

## 2014-05-06 DIAGNOSIS — Z992 Dependence on renal dialysis: Secondary | ICD-10-CM

## 2014-05-06 DIAGNOSIS — I70261 Atherosclerosis of native arteries of extremities with gangrene, right leg: Secondary | ICD-10-CM | POA: Diagnosis present

## 2014-05-06 DIAGNOSIS — E43 Unspecified severe protein-calorie malnutrition: Secondary | ICD-10-CM | POA: Diagnosis present

## 2014-05-06 DIAGNOSIS — L02214 Cutaneous abscess of groin: Secondary | ICD-10-CM | POA: Diagnosis not present

## 2014-05-06 DIAGNOSIS — D509 Iron deficiency anemia, unspecified: Secondary | ICD-10-CM | POA: Diagnosis present

## 2014-05-06 DIAGNOSIS — I96 Gangrene, not elsewhere classified: Secondary | ICD-10-CM | POA: Diagnosis present

## 2014-05-06 DIAGNOSIS — I70269 Atherosclerosis of native arteries of extremities with gangrene, unspecified extremity: Secondary | ICD-10-CM | POA: Diagnosis present

## 2014-05-06 HISTORY — PX: AMPUTATION: SHX166

## 2014-05-06 HISTORY — PX: ABOVE KNEE LEG AMPUTATION: SUR20

## 2014-05-06 HISTORY — PX: INCISION AND DRAINAGE ABSCESS: SHX5864

## 2014-05-06 LAB — CBC
HCT: 29.1 % — ABNORMAL LOW (ref 36.0–46.0)
Hemoglobin: 9.2 g/dL — ABNORMAL LOW (ref 12.0–15.0)
MCH: 35.1 pg — ABNORMAL HIGH (ref 26.0–34.0)
MCHC: 31.6 g/dL (ref 30.0–36.0)
MCV: 111.1 fL — ABNORMAL HIGH (ref 78.0–100.0)
Platelets: 343 10*3/uL (ref 150–400)
RBC: 2.62 MIL/uL — ABNORMAL LOW (ref 3.87–5.11)
RDW: 15.4 % (ref 11.5–15.5)
WBC: 10.7 10*3/uL — ABNORMAL HIGH (ref 4.0–10.5)

## 2014-05-06 LAB — COMPREHENSIVE METABOLIC PANEL
ALBUMIN: 3 g/dL — AB (ref 3.5–5.2)
ALT: 12 U/L (ref 0–35)
ANION GAP: 17 — AB (ref 5–15)
AST: 25 U/L (ref 0–37)
Alkaline Phosphatase: 79 U/L (ref 39–117)
BILIRUBIN TOTAL: 0.9 mg/dL (ref 0.3–1.2)
BUN: 42 mg/dL — ABNORMAL HIGH (ref 6–23)
CO2: 19 mmol/L (ref 19–32)
CREATININE: 9.43 mg/dL — AB (ref 0.50–1.10)
Calcium: 8.8 mg/dL (ref 8.4–10.5)
Chloride: 102 mmol/L (ref 96–112)
GFR calc Af Amer: 4 mL/min — ABNORMAL LOW (ref >90)
GFR, EST NON AFRICAN AMERICAN: 3 mL/min — AB (ref >90)
Glucose, Bld: 85 mg/dL (ref 70–99)
Potassium: 6 mmol/L — ABNORMAL HIGH (ref 3.5–5.1)
Sodium: 138 mmol/L (ref 135–145)
TOTAL PROTEIN: 7.9 g/dL (ref 6.0–8.3)

## 2014-05-06 LAB — RENAL FUNCTION PANEL
Albumin: 2.7 g/dL — ABNORMAL LOW (ref 3.5–5.2)
Anion gap: 12 (ref 5–15)
BUN: 46 mg/dL — ABNORMAL HIGH (ref 6–23)
CO2: 24 mmol/L (ref 19–32)
Calcium: 8.4 mg/dL (ref 8.4–10.5)
Chloride: 103 mmol/L (ref 96–112)
Creatinine, Ser: 9.71 mg/dL — ABNORMAL HIGH (ref 0.50–1.10)
GFR calc Af Amer: 4 mL/min — ABNORMAL LOW (ref >90)
GFR, EST NON AFRICAN AMERICAN: 3 mL/min — AB (ref >90)
GLUCOSE: 107 mg/dL — AB (ref 70–99)
PHOSPHORUS: 8.2 mg/dL — AB (ref 2.3–4.6)
POTASSIUM: 6 mmol/L — AB (ref 3.5–5.1)
Sodium: 139 mmol/L (ref 135–145)

## 2014-05-06 LAB — PROTIME-INR
INR: 1.08 (ref 0.00–1.49)
Prothrombin Time: 14.1 seconds (ref 11.6–15.2)

## 2014-05-06 LAB — GLUCOSE, CAPILLARY
GLUCOSE-CAPILLARY: 109 mg/dL — AB (ref 70–99)
GLUCOSE-CAPILLARY: 115 mg/dL — AB (ref 70–99)
Glucose-Capillary: 54 mg/dL — ABNORMAL LOW (ref 70–99)

## 2014-05-06 LAB — APTT: APTT: 39 s — AB (ref 24–37)

## 2014-05-06 SURGERY — AMPUTATION, ABOVE KNEE
Anesthesia: General | Site: Leg Upper | Laterality: Right

## 2014-05-06 MED ORDER — PHENOL 1.4 % MT LIQD
1.0000 | OROMUCOSAL | Status: DC | PRN
  Filled 2014-05-06: qty 177

## 2014-05-06 MED ORDER — DOCUSATE SODIUM 100 MG PO CAPS: 100.0000 mg | ORAL_CAPSULE | Freq: Two times a day (BID) | ORAL | Status: DC | PRN

## 2014-05-06 MED ORDER — NEOSTIGMINE METHYLSULFATE 10 MG/10ML IV SOLN
INTRAVENOUS | Status: DC | PRN
  Administered 2014-05-06: 3 mg via INTRAVENOUS

## 2014-05-06 MED ORDER — LABETALOL HCL 5 MG/ML IV SOLN
10.0000 mg | INTRAVENOUS | Status: DC | PRN
  Filled 2014-05-06: qty 4

## 2014-05-06 MED ORDER — LEVOTHYROXINE SODIUM 150 MCG PO TABS
150.0000 ug | ORAL_TABLET | Freq: Every day | ORAL | Status: DC
  Administered 2014-05-07 – 2014-05-10 (×4): 150 ug via ORAL
  Filled 2014-05-06 (×5): qty 1

## 2014-05-06 MED ORDER — MORPHINE SULFATE 2 MG/ML IJ SOLN
INTRAMUSCULAR | Status: AC
  Filled 2014-05-06: qty 1

## 2014-05-06 MED ORDER — LIDOCAINE-PRILOCAINE 2.5-2.5 % EX CREA
1.0000 "application " | TOPICAL_CREAM | CUTANEOUS | Status: DC | PRN
  Filled 2014-05-06: qty 5

## 2014-05-06 MED ORDER — 0.9 % SODIUM CHLORIDE (POUR BTL) OPTIME
TOPICAL | Status: DC | PRN
  Administered 2014-05-06: 1000 mL

## 2014-05-06 MED ORDER — GUAIFENESIN-DM 100-10 MG/5ML PO SYRP
15.0000 mL | ORAL_SOLUTION | ORAL | Status: DC | PRN
Start: 2014-05-06 — End: 2014-05-10

## 2014-05-06 MED ORDER — SODIUM CHLORIDE 0.9 % IV SOLN: 100.0000 mL | INTRAVENOUS | Status: DC | PRN

## 2014-05-06 MED ORDER — DARBEPOETIN ALFA 100 MCG/0.5ML IJ SOSY
100.0000 ug | PREFILLED_SYRINGE | INTRAMUSCULAR | Status: DC
  Filled 2014-05-06: qty 0.5

## 2014-05-06 MED ORDER — ROCURONIUM BROMIDE 50 MG/5ML IV SOLN
INTRAVENOUS | Status: AC
  Filled 2014-05-06: qty 1

## 2014-05-06 MED ORDER — LIDOCAINE HCL (CARDIAC) 20 MG/ML IV SOLN
INTRAVENOUS | Status: AC
  Filled 2014-05-06: qty 5

## 2014-05-06 MED ORDER — MAGNESIUM SULFATE 2 GM/50ML IV SOLN
2.0000 g | Freq: Every day | INTRAVENOUS | Status: DC | PRN
Start: 2014-05-06 — End: 2014-05-07
  Filled 2014-05-06: qty 50

## 2014-05-06 MED ORDER — ROCURONIUM BROMIDE 100 MG/10ML IV SOLN
INTRAVENOUS | Status: DC | PRN
  Administered 2014-05-06: 15 mg via INTRAVENOUS

## 2014-05-06 MED ORDER — ENSURE PUDDING PO PUDG
1.0000 | Freq: Three times a day (TID) | ORAL | Status: DC
  Administered 2014-05-08 (×2): 1 via ORAL

## 2014-05-06 MED ORDER — ALTEPLASE 2 MG IJ SOLR
2.0000 mg | Freq: Once | INTRAMUSCULAR | Status: AC | PRN
  Filled 2014-05-06: qty 2

## 2014-05-06 MED ORDER — DEXTROSE 50 % IV SOLN
INTRAVENOUS | Status: AC
  Filled 2014-05-06: qty 50

## 2014-05-06 MED ORDER — NEPRO/CARBSTEADY PO LIQD
237.0000 mL | ORAL | Status: DC | PRN
  Filled 2014-05-06: qty 237

## 2014-05-06 MED ORDER — MORPHINE SULFATE 2 MG/ML IJ SOLN
2.0000 mg | INTRAMUSCULAR | Status: DC | PRN
  Administered 2014-05-06 – 2014-05-07 (×2): 2 mg via INTRAVENOUS
  Administered 2014-05-07: 4 mg via INTRAVENOUS
  Administered 2014-05-07 – 2014-05-08 (×2): 2 mg via INTRAVENOUS
  Administered 2014-05-08: 4 mg via INTRAVENOUS
  Administered 2014-05-09 (×2): 2 mg via INTRAVENOUS
  Filled 2014-05-06: qty 1
  Filled 2014-05-06: qty 2
  Filled 2014-05-06: qty 1
  Filled 2014-05-06: qty 2
  Filled 2014-05-06 (×3): qty 1

## 2014-05-06 MED ORDER — PROPOFOL 10 MG/ML IV BOLUS
INTRAVENOUS | Status: AC
  Filled 2014-05-06: qty 20

## 2014-05-06 MED ORDER — DOXERCALCIFEROL 4 MCG/2ML IV SOLN
2.0000 ug | INTRAVENOUS | Status: DC
  Filled 2014-05-06: qty 2

## 2014-05-06 MED ORDER — PANTOPRAZOLE SODIUM 40 MG PO TBEC
40.0000 mg | DELAYED_RELEASE_TABLET | Freq: Every day | ORAL | Status: DC
  Administered 2014-05-07 – 2014-05-10 (×4): 40 mg via ORAL
  Filled 2014-05-06 (×4): qty 1

## 2014-05-06 MED ORDER — FENTANYL CITRATE 0.05 MG/ML IJ SOLN
INTRAMUSCULAR | Status: AC
  Filled 2014-05-06: qty 5

## 2014-05-06 MED ORDER — METOPROLOL TARTRATE 1 MG/ML IV SOLN
2.0000 mg | INTRAVENOUS | Status: DC | PRN
Start: 2014-05-06 — End: 2014-05-10

## 2014-05-06 MED ORDER — MIDAZOLAM HCL 5 MG/5ML IJ SOLN
INTRAMUSCULAR | Status: DC | PRN
  Administered 2014-05-06: 0.5 mg via INTRAVENOUS

## 2014-05-06 MED ORDER — HYDRALAZINE HCL 20 MG/ML IJ SOLN: 5.0000 mg | INTRAMUSCULAR | Status: DC | PRN

## 2014-05-06 MED ORDER — GLYCOPYRROLATE 0.2 MG/ML IJ SOLN
INTRAMUSCULAR | Status: DC | PRN
  Administered 2014-05-06: .4 mg via INTRAVENOUS

## 2014-05-06 MED ORDER — POTASSIUM CHLORIDE CRYS ER 20 MEQ PO TBCR: 20.0000 meq | EXTENDED_RELEASE_TABLET | Freq: Every day | ORAL | Status: DC | PRN

## 2014-05-06 MED ORDER — SODIUM CHLORIDE 0.9 % IV SOLN
INTRAVENOUS | Status: DC
  Administered 2014-05-06: 10 mL/h via INTRAVENOUS

## 2014-05-06 MED ORDER — ONDANSETRON HCL 4 MG/2ML IJ SOLN
INTRAMUSCULAR | Status: DC | PRN
  Administered 2014-05-06: 4 mg via INTRAVENOUS

## 2014-05-06 MED ORDER — ALUM & MAG HYDROXIDE-SIMETH 200-200-20 MG/5ML PO SUSP: 15.0000 mL | ORAL | Status: DC | PRN

## 2014-05-06 MED ORDER — CHLORHEXIDINE GLUCONATE CLOTH 2 % EX PADS: 6.0000 | MEDICATED_PAD | Freq: Once | CUTANEOUS | Status: DC

## 2014-05-06 MED ORDER — FENTANYL CITRATE 0.05 MG/ML IJ SOLN
25.0000 ug | INTRAMUSCULAR | Status: DC | PRN
  Administered 2014-05-06 (×2): 25 ug via INTRAVENOUS
  Administered 2014-05-06 (×2): 50 ug via INTRAVENOUS

## 2014-05-06 MED ORDER — DOCUSATE SODIUM 100 MG PO CAPS
100.0000 mg | ORAL_CAPSULE | Freq: Every day | ORAL | Status: DC
  Administered 2014-05-07 – 2014-05-10 (×4): 100 mg via ORAL
  Filled 2014-05-06 (×4): qty 1

## 2014-05-06 MED ORDER — VANCOMYCIN HCL 500 MG IV SOLR
500.0000 mg | INTRAVENOUS | Status: DC
  Administered 2014-05-07: 500 mg via INTRAVENOUS
  Filled 2014-05-06 (×4): qty 500

## 2014-05-06 MED ORDER — HEPARIN SODIUM (PORCINE) 1000 UNIT/ML DIALYSIS
1000.0000 [IU] | INTRAMUSCULAR | Status: DC | PRN
  Filled 2014-05-06: qty 1

## 2014-05-06 MED ORDER — PROPOFOL 10 MG/ML IV BOLUS
INTRAVENOUS | Status: DC | PRN
  Administered 2014-05-06: 75 mg via INTRAVENOUS

## 2014-05-06 MED ORDER — NEOSTIGMINE METHYLSULFATE 10 MG/10ML IV SOLN
INTRAVENOUS | Status: AC
  Filled 2014-05-06: qty 1

## 2014-05-06 MED ORDER — HYDROCODONE-ACETAMINOPHEN 10-325 MG PO TABS
1.0000 | ORAL_TABLET | ORAL | Status: DC | PRN
  Administered 2014-05-06 – 2014-05-10 (×6): 1 via ORAL
  Filled 2014-05-06 (×6): qty 1

## 2014-05-06 MED ORDER — POLYETHYLENE GLYCOL 3350 17 G PO PACK
17.0000 g | PACK | Freq: Every day | ORAL | Status: DC | PRN
  Administered 2014-05-07: 17 g via ORAL
  Filled 2014-05-06 (×3): qty 1

## 2014-05-06 MED ORDER — MEPERIDINE HCL 25 MG/ML IJ SOLN: 6.2500 mg | INTRAMUSCULAR | Status: DC | PRN

## 2014-05-06 MED ORDER — LIDOCAINE HCL (PF) 1 % IJ SOLN: 5.0000 mL | INTRAMUSCULAR | Status: DC | PRN

## 2014-05-06 MED ORDER — MIDAZOLAM HCL 2 MG/2ML IJ SOLN
INTRAMUSCULAR | Status: AC
Start: 2014-05-06 — End: 2014-05-06
  Filled 2014-05-06: qty 2

## 2014-05-06 MED ORDER — FENTANYL CITRATE 0.05 MG/ML IJ SOLN
INTRAMUSCULAR | Status: DC | PRN
  Administered 2014-05-06 (×3): 50 ug via INTRAVENOUS
  Administered 2014-05-06: 100 ug via INTRAVENOUS

## 2014-05-06 MED ORDER — DEXTROSE 5 % IV SOLN
INTRAVENOUS | Status: DC | PRN
  Administered 2014-05-06: 10:00:00 via INTRAVENOUS

## 2014-05-06 MED ORDER — ONDANSETRON HCL 4 MG/2ML IJ SOLN
INTRAMUSCULAR | Status: AC
  Filled 2014-05-06: qty 2

## 2014-05-06 MED ORDER — LIDOCAINE HCL (CARDIAC) 20 MG/ML IV SOLN
INTRAVENOUS | Status: DC | PRN
  Administered 2014-05-06: 60 mg via INTRAVENOUS

## 2014-05-06 MED ORDER — PENTAFLUOROPROP-TETRAFLUOROETH EX AERO: 1.0000 "application " | INHALATION_SPRAY | CUTANEOUS | Status: DC | PRN

## 2014-05-06 MED ORDER — AMLODIPINE BESYLATE 10 MG PO TABS
10.0000 mg | ORAL_TABLET | Freq: Every day | ORAL | Status: DC
  Administered 2014-05-06 – 2014-05-09 (×4): 10 mg via ORAL
  Filled 2014-05-06 (×5): qty 1

## 2014-05-06 MED ORDER — PROMETHAZINE HCL 25 MG/ML IJ SOLN: 6.2500 mg | INTRAMUSCULAR | Status: DC | PRN

## 2014-05-06 MED ORDER — FENTANYL CITRATE 0.05 MG/ML IJ SOLN
INTRAMUSCULAR | Status: AC
  Filled 2014-05-06: qty 2

## 2014-05-06 MED ORDER — VANCOMYCIN HCL IN DEXTROSE 1-5 GM/200ML-% IV SOLN
1000.0000 mg | Freq: Once | INTRAVENOUS | Status: AC
  Administered 2014-05-06: 1000 mg via INTRAVENOUS
  Filled 2014-05-06: qty 200

## 2014-05-06 MED ORDER — HYDRALAZINE HCL 25 MG PO TABS
25.0000 mg | ORAL_TABLET | ORAL | Status: DC
  Filled 2014-05-06 (×2): qty 1

## 2014-05-06 MED ORDER — HEPARIN SODIUM (PORCINE) 5000 UNIT/ML IJ SOLN
5000.0000 [IU] | Freq: Three times a day (TID) | INTRAMUSCULAR | Status: DC
  Administered 2014-05-07 – 2014-05-10 (×9): 5000 [IU] via SUBCUTANEOUS
  Filled 2014-05-06 (×12): qty 1

## 2014-05-06 MED ORDER — ONDANSETRON HCL 4 MG/2ML IJ SOLN: 4.0000 mg | Freq: Four times a day (QID) | INTRAMUSCULAR | Status: DC | PRN

## 2014-05-06 MED ORDER — GLYCOPYRROLATE 0.2 MG/ML IJ SOLN
INTRAMUSCULAR | Status: AC
  Filled 2014-05-06: qty 2

## 2014-05-06 MED ORDER — SODIUM CHLORIDE 0.9 % IV SOLN: INTRAVENOUS | Status: DC

## 2014-05-06 MED ORDER — INSULIN ASPART 100 UNIT/ML ~~LOC~~ SOLN
SUBCUTANEOUS | Status: AC
  Filled 2014-05-06: qty 1

## 2014-05-06 MED ORDER — ACETAMINOPHEN 325 MG PO TABS
650.0000 mg | ORAL_TABLET | ORAL | Status: DC | PRN
  Administered 2014-05-09: 650 mg via ORAL
  Filled 2014-05-06: qty 2

## 2014-05-06 MED ORDER — LISINOPRIL 40 MG PO TABS
40.0000 mg | ORAL_TABLET | Freq: Every day | ORAL | Status: DC
  Administered 2014-05-06 – 2014-05-08 (×3): 40 mg via ORAL
  Filled 2014-05-06 (×4): qty 1

## 2014-05-06 SURGICAL SUPPLY — 44 items
BANDAGE ELASTIC 6 VELCRO ST LF (GAUZE/BANDAGES/DRESSINGS) ×3 IMPLANT
BLADE SAW RECIP 87.9 MT (BLADE) ×3 IMPLANT
BNDG COHESIVE 4X5 TAN STRL (GAUZE/BANDAGES/DRESSINGS) ×2 IMPLANT
BNDG COHESIVE 6X5 TAN STRL LF (GAUZE/BANDAGES/DRESSINGS) ×3 IMPLANT
BNDG GAUZE ELAST 4 BULKY (GAUZE/BANDAGES/DRESSINGS) ×3 IMPLANT
CANISTER SUCTION 2500CC (MISCELLANEOUS) ×3 IMPLANT
CLIP TI MEDIUM 6 (CLIP) IMPLANT
COVER SURGICAL LIGHT HANDLE (MISCELLANEOUS) ×3 IMPLANT
COVER TABLE BACK 60X90 (DRAPES) ×3 IMPLANT
DRAIN CHANNEL 19F RND (DRAIN) IMPLANT
DRAPE ORTHO SPLIT 77X108 STRL (DRAPES) ×6
DRAPE PROXIMA HALF (DRAPES) ×3 IMPLANT
DRAPE SURG ORHT 6 SPLT 77X108 (DRAPES) ×4 IMPLANT
DRSG ADAPTIC 3X8 NADH LF (GAUZE/BANDAGES/DRESSINGS) ×3 IMPLANT
ELECT REM PT RETURN 9FT ADLT (ELECTROSURGICAL) ×3
ELECTRODE REM PT RTRN 9FT ADLT (ELECTROSURGICAL) ×2 IMPLANT
EVACUATOR SILICONE 100CC (DRAIN) IMPLANT
GAUZE SPONGE 4X4 12PLY STRL (GAUZE/BANDAGES/DRESSINGS) ×3 IMPLANT
GLOVE BIO SURGEON STRL SZ 6.5 (GLOVE) ×2 IMPLANT
GLOVE BIO SURGEON STRL SZ7.5 (GLOVE) ×3 IMPLANT
GLOVE BIOGEL PI IND STRL 6.5 (GLOVE) IMPLANT
GLOVE BIOGEL PI INDICATOR 6.5 (GLOVE) ×2
GLOVE SURG SS PI 7.0 STRL IVOR (GLOVE) ×2 IMPLANT
GOWN STRL REUS W/ TWL LRG LVL3 (GOWN DISPOSABLE) ×7 IMPLANT
GOWN STRL REUS W/TWL LRG LVL3 (GOWN DISPOSABLE) ×12
KIT BASIN OR (CUSTOM PROCEDURE TRAY) ×3 IMPLANT
KIT ROOM TURNOVER OR (KITS) ×3 IMPLANT
NS IRRIG 1000ML POUR BTL (IV SOLUTION) ×3 IMPLANT
PACK GENERAL/GYN (CUSTOM PROCEDURE TRAY) ×3 IMPLANT
PAD ARMBOARD 7.5X6 YLW CONV (MISCELLANEOUS) ×6 IMPLANT
SPONGE GAUZE 4X4 12PLY STER LF (GAUZE/BANDAGES/DRESSINGS) ×2 IMPLANT
STAPLER VISISTAT 35W (STAPLE) ×3 IMPLANT
STOCKINETTE IMPERVIOUS LG (DRAPES) ×3 IMPLANT
SUT ETHILON 3 0 PS 1 (SUTURE) IMPLANT
SUT SILK 2 0 SH (SUTURE) IMPLANT
SUT SILK 2 0 TIES 10X30 (SUTURE) ×3 IMPLANT
SUT VIC AB 2-0 CT1 18 (SUTURE) ×4 IMPLANT
SUT VIC AB 2-0 SH 18 (SUTURE) ×6 IMPLANT
SUT VIC AB 3-0 SH 27 (SUTURE) ×6
SUT VIC AB 3-0 SH 27X BRD (SUTURE) ×4 IMPLANT
SWAB COLLECTION DEVICE MRSA (MISCELLANEOUS) ×1 IMPLANT
TAPE CLOTH SURG 4X10 WHT LF (GAUZE/BANDAGES/DRESSINGS) ×2 IMPLANT
UNDERPAD 30X30 INCONTINENT (UNDERPADS AND DIAPERS) ×3 IMPLANT
WATER STERILE IRR 1000ML POUR (IV SOLUTION) ×3 IMPLANT

## 2014-05-06 NOTE — OR Nursing (Signed)
10:33 MD Fields spoke with son of patient about cyst/abscess to left groin and an I&D after right AKA. Son consented over phone to procedure.

## 2014-05-06 NOTE — Transfer of Care (Signed)
Immediate Anesthesia Transfer of Care Note  Patient: Nicole [name]  Procedure(s) Performed: Procedure(s): AMPUTATION ABOVE RIGHT KNEE (Right) INCISION AND DRAINAGE ABSCESS LEFT GROIN (Left)  Patient Location: PACU  Anesthesia Type:General  Level of Consciousness: awake and patient cooperative  Airway & Oxygen Therapy: Patient Spontanous Breathing and Patient connected to nasal cannula oxygen  Post-op Assessment: Report given to RN, Post -op Vital signs reviewed and stable and Patient moving all extremities  Post vital signs: Reviewed and stable  Last Vitals:  Filed Vitals:   05/06/14 1130  BP: 155/51  Pulse: 64  Temp: 36.4 C  Resp: 27    Complications: No apparent anesthesia complications

## 2014-05-06 NOTE — Progress Notes (Signed)
Unable to complete admission documentation.  Pt sleeping from surgery.  Family present for transfer from PACU.  Pt resting with call bell within reach.  Will continue to monitor. Payton Emerald, RN

## 2014-05-06 NOTE — Anesthesia Preprocedure Evaluation (Addendum)
Anesthesia Evaluation  Patient identified by MRN, date of birth, ID band Patient awake    Reviewed: Allergy & Precautions, NPO status , Patient's Chart, lab work & pertinent test results, reviewed documented beta blocker date and time   Airway Mallampati: II  TM Distance: >3 FB Neck ROM: Full    Dental  (+) Edentulous Upper   Pulmonary former smoker (quit 2013),  + rhonchi         Cardiovascular hypertension, Pt. on medications + Peripheral Vascular Disease Rhythm:Regular  ECHO 2013 EF 50%, EKG 2015 LVH   Neuro/Psych Anxiety Depression    GI/Hepatic Neg liver ROS,   Endo/Other  Hypothyroidism   Renal/GU DialysisRenal diseaseTu-Th-Sat via diatek     Musculoskeletal   Abdominal (+)  Abdomen: soft.    Peds  Hematology  (+) anemia , 8/27 06/2013  9/29 05/06/2014   Anesthesia Other Findings   Reproductive/Obstetrics                         Anesthesia Physical Anesthesia Plan  ASA: III  Anesthesia Plan: General   Post-op Pain Management:    Induction: Intravenous  Airway Management Planned: Oral ETT  Additional Equipment:   Intra-op Plan:   Post-operative Plan: Extubation in OR  Informed Consent: I have reviewed the patients History and Physical, chart, labs and discussed the procedure including the risks, benefits and alternatives for the proposed anesthesia with the patient or authorized representative who has indicated his/her understanding and acceptance.     Plan Discussed with:   Anesthesia Plan Comments: (K+ 6.0, will give insulin and glucose and proceed  )       Anesthesia Quick Evaluation

## 2014-05-06 NOTE — Anesthesia Postprocedure Evaluation (Signed)
  Anesthesia Post-op Note  Patient: Nicole [name]  Procedure(s) Performed: Procedure(s): AMPUTATION ABOVE RIGHT KNEE (Right) INCISION AND DRAINAGE ABSCESS LEFT GROIN (Left)  Patient Location: PACU  Anesthesia Type:General  Level of Consciousness: awake and alert   Airway and Oxygen Therapy: Patient Spontanous Breathing and Patient connected to nasal cannula oxygen  Post-op Pain: mild  Post-op Assessment: Post-op Vital signs reviewed, Patient's Cardiovascular Status Stable, Respiratory Function Stable, Patent Airway and No signs of Nausea or vomiting  Post-op Vital Signs: Reviewed and stable  Last Vitals:  Filed Vitals:   05/06/14 1130  BP: 155/51  Pulse: 64  Temp: 36.4 C  Resp: 27    Complications: No apparent anesthesia complications

## 2014-05-06 NOTE — Progress Notes (Signed)
ANTIBIOTIC CONSULT NOTE - INITIAL  Pharmacy Consult for Vancomycin Indication: surgical prophylaxis and drained abscess  No Known Allergies  Patient Measurements: Height: 5\' 2"  (157.5 cm) Weight: 114 lb (51.71 kg) IBW/kg (Calculated) : 50.1  Vital Signs: Temp: 98.3 F (36.8 C) (02/29 1416) Temp Source: Oral (02/29 1416) BP: 149/95 mmHg (02/29 1416) Pulse Rate: 73 (02/29 1416) Intake/Output from previous day:   Intake/Output from this shift: Total I/O In: 350 [I.V.:350] Out: 50 [Blood:50]  Labs:  Recent Labs  05/06/14 0825  WBC 10.7*  HGB 9.2*  PLT 343  CREATININE 9.43*   Estimated Creatinine Clearance: 3.8 mL/min (by C-G formula based on Cr of 9.43). No results for input(s): VANCOTROUGH, VANCOPEAK, VANCORANDOM, GENTTROUGH, GENTPEAK, GENTRANDOM, TOBRATROUGH, TOBRAPEAK, TOBRARND, AMIKACINPEAK, AMIKACINTROU, AMIKACIN in the last 72 hours.   Microbiology: No results found for this or any previous visit (from the past 720 hour(s)).  Medical History: Past Medical History  Diagnosis Date  . Hypertension   . ESRD (end stage renal disease)   . Peripheral vascular disease     revascularization appx. 14 years ago  . Hypothyroidism   . Hiatal hernia   . Iron deficiency anemia   . Osteoarthritis   . Depression with anxiety   . Pulmonary embolism   . AAA (abdominal aortic aneurysm) 1999    aortic byfem bypass  . Anemia in chronic kidney disease(285.21)   . Spinal stenosis, lumbar region, without neurogenic claudication   . Other specified disease of white blood cells   . Hypopotassemia   . Other abnormal blood chemistry   . Macrocytosis   . Depression   . Anxiety   . Bradycardia   . H/O prolonged Q-T interval on ECG     Referral to EP    Medications:  Prescriptions prior to admission  Medication Sig Dispense Refill Last Dose  . acetaminophen (TYLENOL) 325 MG tablet Take 650 mg by mouth every 4 (four) hours as needed for mild pain.    05/05/2014 at Unknown time   . amLODipine (NORVASC) 10 MG tablet Take 10 mg by mouth at bedtime.   05/05/2014 at Unknown time  . hydrALAZINE (APRESOLINE) 25 MG tablet Take 25 mg by mouth See admin instructions. Take 25 mg twice daily after dialysis days on Tuesday Thursday and Saturday   05/05/2014 at Unknown time  . HYDROcodone-acetaminophen (NORCO) 10-325 MG per tablet Take one tablet by mouth every four hours around the clock for pain 180 tablet 0 05/05/2014 at Unknown time  . levothyroxine (SYNTHROID, LEVOTHROID) 150 MCG tablet Take 150 mcg by mouth daily before breakfast.   05/05/2014 at Unknown time  . lisinopril (PRINIVIL,ZESTRIL) 40 MG tablet Take 40 mg by mouth at bedtime.   05/05/2014 at Unknown time  . traMADol (ULTRAM) 50 MG tablet Take 50 mg by mouth 3 (three) times daily.   05/05/2014 at Unknown time  . docusate sodium (COLACE) 100 MG capsule Take 100 mg by mouth 2 (two) times daily as needed for mild constipation.   Unknown at Unknown time  . oxyCODONE (OXY IR/ROXICODONE) 5 MG immediate release tablet Take 1 tablet (5 mg total) by mouth every 4 (four) hours as needed for severe pain. (Patient not taking: Reported on 04/24/2014) 180 tablet 0 Not Taking at Unknown time  . polyethylene glycol (MIRALAX / GLYCOLAX) packet Take 17 g by mouth daily as needed for mild constipation or moderate constipation.   Unknown at Unknown time   Scheduled:  . amLODipine  10 mg Oral QHS  . [  START ON 05/07/2014] docusate sodium  100 mg Oral Daily  . feeding supplement (ENSURE)  1 Container Oral TID WC  . heparin  5,000 Units Subcutaneous 3 times per day  . hydrALAZINE  25 mg Oral See admin instructions  . [START ON 05/07/2014] levothyroxine  150 mcg Oral QAC breakfast  . lisinopril  40 mg Oral QHS  . pantoprazole  40 mg Oral Daily   Infusions:  . sodium chloride     Assessment: 81yo female with history of ESRD on HD TTS here for R AKA and I&D left groin abscess. Pharmacy is consulted to dose vancomycin and renally adjust other  antibiotics for surgical prophylaxis and I&D of L groin abscess. Pt is afebrile, WBC 10.7, sCr 9.43.   Goal of Therapy:  Vancomycin pre-HD target level 15-25 mcg/ml  Plan:  Vancomycin 1g IV load followed by 500mg  IV qHD Measure antibiotic drug levels at steady state Follow up culture results, HD schedule, and clinical course  Andrey Cota. Diona Foley, PharmD Clinical Pharmacist Pager [phone] Sharilyn Sites Jerilynn Mages 05/06/2014,3:06 PM

## 2014-05-06 NOTE — Anesthesia Procedure Notes (Signed)
Procedure Name: Intubation Date/Time: 05/06/2014 10:17 AM Performed by: Terrill Mohr Pre-anesthesia Checklist: Patient identified, Emergency Drugs available, Suction available and Patient being monitored Patient Re-evaluated:Patient Re-evaluated prior to inductionOxygen Delivery Method: Circle system utilized Preoxygenation: Pre-oxygenation with 100% oxygen Intubation Type: IV induction Ventilation: Mask ventilation without difficulty Laryngoscope Size: Mac and 3 Grade View: Grade II Tube type: Oral Tube size: 7.5 mm Number of attempts: 1 Airway Equipment and Method: Stylet Placement Confirmation: ETT inserted through vocal cords under direct vision,  breath sounds checked- equal and bilateral and positive ETCO2 Secured at: 21 (cm at gum) cm Tube secured with: Tape Dental Injury: Teeth and Oropharynx as per pre-operative assessment

## 2014-05-06 NOTE — Consult Note (Signed)
[facility] Renal Consultation Note  Indication for Consultation:  Management of ESRD/hemodialysis; anemia, hypertension/volume and secondary hyperparathyroidism  HPI: Nicole [name] is a 81 y.o. female with a history of hypertension, depression, severe peripheral vascular disease s/p abdominal aortic aneurysm repair and aortobifemoral bypass in 1999, and ESRD on dialysis at the [facility] who had abdominal aortogram per Dr. Oneida Alar of [facility] to evaluate worsening right foot gangrene on 06/21/13, at which time she was deemed not a candidate for revascularization and would likely require amputation, but only recently, during office follow-up on 2/17, agreed to have surgery and today presented for right above knee amputation.  She was seen post-surgery in PACU and was confused and restless, but with no specific complaints.  Dialysis Orders:  TTS @ [facility] 52.5 kg     4 hrs       400/A1.5      2K/2Ca      Heparin 6000 U       R IJ catheter Hectorol 2 mcg           Aranesp 60 mcg on Thurs          No Venofer  Past Medical History  Diagnosis Date  . Hypertension   . ESRD (end stage renal disease)   . Peripheral vascular disease     revascularization appx. 14 years ago  . Hypothyroidism   . Hiatal hernia   . Iron deficiency anemia   . Osteoarthritis   . Depression with anxiety   . Pulmonary embolism   . AAA (abdominal aortic aneurysm) 1999    aortic byfem bypass  . Anemia in chronic kidney disease(285.21)   . Spinal stenosis, lumbar region, without neurogenic claudication   . Other specified disease of white blood cells   . Hypopotassemia   . Other abnormal blood chemistry   . Macrocytosis   . Depression   . Anxiety   . Bradycardia   . H/O prolonged Q-T interval on ECG     Referral to EP   Past Surgical History  Procedure Laterality Date  . Abdominal surgery  about 14 years ago    REVASCULARIZATION  . Ateriovenous graft  04/18/2009    LEFT FOREARM avg  .  Av fistula placement, radiocephalic  67/20/94  . Avgg removal  12/02/09    SECONDARY TO INFECTION LT FOREARM  . Av fistula placement, radiocephalic  7/0/96    NONMATURED  . Arteriovenous graft placement  07/24/10    RIGHT FOREARM LOOP  . Dg av dialysis graft declot or  12/05/10, 6/17/123, 09/24/11, 01/26/12    CLOTTED GRAFT  . Angioplasty  12/21/11    of RUE AVG  . Thrombectomy femoral artery Left 06/20/2013    Procedure: THROMBECTOMY FEMORAL ARTERY;  Surgeon: Elam Dutch, MD;  Location: [facility];  Service: Vascular;  Laterality: Left;  . Abdominal aortagram N/A 06/20/2013    Procedure: ABDOMINAL Maxcine Ham;  Surgeon: Serafina Mitchell, MD;  Location: [facility] CATH [facility];  Service: Cardiovascular;  Laterality: N/A;   History reviewed. No pertinent family history.  Social History She quit smoking cigarettes when she moved into her nursing home 2-3 years ago and states that a pack usually lasted 2-3 days. She denies any history of alcohol or illicit drug use.  No Known Allergies Prior to Admission medications   Medication Sig Start Date End Date Taking? Authorizing Provider  acetaminophen (TYLENOL) 325 MG tablet Take 650 mg by mouth every 4 (four) hours as needed for  mild pain.    Yes Historical Provider, MD  amLODipine (NORVASC) 10 MG tablet Take 10 mg by mouth at bedtime.   Yes Historical Provider, MD  hydrALAZINE (APRESOLINE) 25 MG tablet Take 25 mg by mouth See admin instructions. Take 25 mg twice daily after dialysis days on Tuesday Thursday and Saturday   Yes Historical Provider, MD  HYDROcodone-acetaminophen (NORCO) 10-325 MG per tablet Take one tablet by mouth every four hours around the clock for pain 11/09/13  Yes Tiffany L Reed, DO  levothyroxine (SYNTHROID, LEVOTHROID) 150 MCG tablet Take 150 mcg by mouth daily before breakfast.   Yes Historical Provider, MD  lisinopril (PRINIVIL,ZESTRIL) 40 MG tablet Take 40 mg by mouth at bedtime.   Yes Historical Provider, MD  traMADol (ULTRAM) 50 MG  tablet Take 50 mg by mouth 3 (three) times daily.   Yes Historical Provider, MD  docusate sodium (COLACE) 100 MG capsule Take 100 mg by mouth 2 (two) times daily as needed for mild constipation.    Historical Provider, MD  oxyCODONE (OXY IR/ROXICODONE) 5 MG immediate release tablet Take 1 tablet (5 mg total) by mouth every 4 (four) hours as needed for severe pain. Patient not taking: Reported on 04/24/2014 07/20/13   Lauree Chandler, NP  polyethylene glycol (MIRALAX / GLYCOLAX) packet Take 17 g by mouth daily as needed for mild constipation or moderate constipation.    Historical Provider, MD   Labs:  Results for orders placed or performed during the hospital encounter of 05/06/14 (from the past 48 hour(s))  APTT     Status: Abnormal   Collection Time: 05/06/14  8:25 AM  Result Value Ref Range   aPTT 39 (H) 24 - 37 seconds    Comment:        IF BASELINE aPTT IS ELEVATED, SUGGEST PATIENT RISK ASSESSMENT BE USED TO DETERMINE APPROPRIATE ANTICOAGULANT THERAPY.   CBC     Status: Abnormal   Collection Time: 05/06/14  8:25 AM  Result Value Ref Range   WBC 10.7 (H) 4.0 - 10.5 K/uL   RBC 2.62 (L) 3.87 - 5.11 MIL/uL   Hemoglobin 9.2 (L) 12.0 - 15.0 g/dL   HCT 29.1 (L) 36.0 - 46.0 %   MCV 111.1 (H) 78.0 - 100.0 fL   MCH 35.1 (H) 26.0 - 34.0 pg   MCHC 31.6 30.0 - 36.0 g/dL   RDW 15.4 11.5 - 15.5 %   Platelets 343 150 - 400 K/uL  Comprehensive metabolic panel     Status: Abnormal   Collection Time: 05/06/14  8:25 AM  Result Value Ref Range   Sodium 138 135 - 145 mmol/L   Potassium 6.0 (H) 3.5 - 5.1 mmol/L   Chloride 102 96 - 112 mmol/L   CO2 19 19 - 32 mmol/L   Glucose, Bld 85 70 - 99 mg/dL   BUN 42 (H) 6 - 23 mg/dL   Creatinine, Ser 9.43 (H) 0.50 - 1.10 mg/dL   Calcium 8.8 8.4 - 10.5 mg/dL   Total Protein 7.9 6.0 - 8.3 g/dL   Albumin 3.0 (L) 3.5 - 5.2 g/dL   AST 25 0 - 37 U/L   ALT 12 0 - 35 U/L   Alkaline Phosphatase 79 39 - 117 U/L   Total Bilirubin 0.9 0.3 - 1.2 mg/dL   GFR  calc non Af Amer 3 (L) >90 mL/min   GFR calc Af Amer 4 (L) >90 mL/min    Comment: (NOTE) The eGFR has been calculated using the CKD  EPI equation. This calculation has not been validated in all clinical situations. eGFR's persistently <90 mL/min signify possible Chronic Kidney Disease.    Anion gap 17 (H) 5 - 15  Protime-INR     Status: None   Collection Time: 05/06/14  8:25 AM  Result Value Ref Range   Prothrombin Time 14.1 11.6 - 15.2 seconds   INR 1.08 0.00 - 1.49   Review of Systems unable to be obtained secondary to patient factors.  Physical Exam: Filed Vitals:   05/06/14 1130  BP: 155/51  Pulse: 64  Temp: 97.6 F (36.4 C)  Resp: 27     General appearance: alert, moderate distress and restless In pain Head: Normocephalic, without obvious abnormality, atraumatic Neck: no adenopathy, no carotid bruit, no JVD and supple, symmetrical, trachea midline PCL Resp: clear to auscultation bilaterally decreased bs, rales in bases Cardio: regular rate and rhythm, S1, S2 normal, Gr II/VI systolic murmur, no click, rub or gallop LV lift GI: soft, non-tender; bowel sounds normal; no masses,  no organomegaly Liver down 5 cm Extremities:  Right AKA with dressing, 2+ edema on left, no cyanosis Neurologic: Grossly normal Dialysis Access: R Ij catheter  Assessment/Plan: 1. R foot gangrene - s/p R AKA today per Dr. Oneida Alar, stable post-surgery. 2. ESRD - HD on TTS @ [place], K 6.  HD today, vol xs and ^ K 3. Hypertension/volume - BP 155/51, outpatient meds include Amlodipine 10 mg qhs, Hydralazine 25 mg bid, Lisinopril 40 mg qhs; wt 51.7 kg, needs to lower EDW. Stop hydral. Lower dry 4. Anemia - Hgb 9.2, Aranesp 60 mcg on Thurs. ^ dose 5. Metabolic bone disease - Ca 8.8 (9.6 corrected), last P 9, iPTH 370; Hectorol 2 mcg, Renvela 2 with meals. 6. Nutrition - Alb 3, currently NPO, vitamin. Suspect chronic inflam with PVD 7. PVD - s/p aortobifemoral bypass & AAA in 1999. 8. Hypothyroidism - on  Synthroid.  LYLES,CHARLES 05/06/2014, 11:43 AM   Attending Nephrologist:   Mauricia Area, MD I have seen and examined this patient and agree with the plan of care seen, eval, examined, orders written, meds adjusted. .  Julie-Ann Vanmaanen L 05/06/2014, 1:13 PM

## 2014-05-06 NOTE — Progress Notes (Signed)
Utilization review completed.  

## 2014-05-06 NOTE — Op Note (Addendum)
[facility] OPERATIVE NOTE  Procedure: Right above knee amputation, Incise and drain left groin abscess  Surgeon(s): Elam Dutch, MD  ASSISTANT: Silva Bandy, PA-C  Anesthesia: General  Specimens: Right leg, culture of left groin wound  PROCEDURE DETAIL: After obtaining informed consent, the patient was taken to the operating room. The patient was placed in supine position the operating room table. After induction of general anesthesia and endotracheal intubation the patient's Foley catheter was placed. Next patient's entire right lower extremity was prepped and draped in usual sterile fashion. A circumferential incision was made on the right leg just above the knee. The incision was carried down into the sucutaneous tissues down to level of the saphenous vein. This was ligated and divided between silk ties. Soft tissues were taken down as well as the muscle and fascia with cautery. The superficial femoral artery and vein were dissected free circumferentially clamped and divided. These were  ligated proximally. Remainder of the soft tissues were taken down with cautery. The periosteum was raised on the femur approximately 5 cm above the skin edge. The femur was divided at this level. The leg was passed off the table as a specimen. Hemostasis was obtained. The wound was thoroughly irrigated with normal saline solution. The fascial edges were reapproximated using interrupted 2 0 Vicryl sutures. The subcutaneous tissues reapproximated using a running 3-0 Vicryl suture. The skin was closed staples.   Attention was then turned to the left groin.  There was a 3 cm carbuncle at the apex of and old left groin incision.  The patient had and aortobifem in 1999 and thrombectomy of this limb April 2015.  Betadine prep was performed.  The carbuncle was incised and approximately 15 cc of yellow thick purulent material was evacuated.  This was sent to the lab for culture.  The wound was then  packed with a saline moistened dressing.  No graft was visualized in the groin at this point.  Patient tolerated procedure well and there were no complications. Instrument sponge and needle counts correct in the case. Patient was taken to recovery in stable condition.  Ruta Hinds, MD [facility] [place] Office: [phone] Pager: [phone]

## 2014-05-06 NOTE — H&P (View-Only) (Signed)
Patient is a 81 year old female referred  for evaluation of gangrene right foot. The patient previously has been deemed not a revascularization candidate due to her essentially nonambulatory status as well as dementia and inability to tolerate an arteriogram procedure. She has some pain in the right foot but is able to take 1 or 2 pain pills each evening and this is controlled. She denies any fever or chills. She has no significant drainage from the foot. She previously had aortobifemoral bypass. At her arteriogram she occluded the left limb of her aortobifem and required emergent thrombectomy of the left limb of her aortobifem. She has end-stage renal disease and is on dialysis. Her dialysis today is Tuesday Thursday Saturday. She resides at a skilled nursing facility. Other chronic medical problems include spinal stenosis depression and anxiety all of which are currently stable. She basically sits either on the side of the bed or in her wheelchair all day long.    Past Medical History  Diagnosis Date  . Hypertension   . ESRD (end stage renal disease)   . Peripheral vascular disease     revascularization appx. 14 years ago  . Hypothyroidism   . Hiatal hernia   . Iron deficiency anemia   . Osteoarthritis   . Depression with anxiety   . Pulmonary embolism   . AAA (abdominal aortic aneurysm) 1999    aortic byfem bypass  . Anemia in chronic kidney disease(285.21)   . Spinal stenosis, lumbar region, without neurogenic claudication   . Other specified disease of white blood cells   . Hypopotassemia   . Other abnormal blood chemistry   . Macrocytosis   . Depression   . Anxiety   . Bradycardia   . H/O prolonged Q-T interval on ECG     Referral to EP   Current Outpatient Prescriptions on File Prior to Visit  Medication Sig Dispense Refill  . acetaminophen (TYLENOL) 325 MG tablet Take 650 mg by mouth every 4 (four) hours as needed for mild pain.     Marland Kitchen amLODipine (NORVASC) 10 MG tablet Take 10  mg by mouth at bedtime.    . calcium acetate (PHOSLO) 667 MG capsule Take 2,001 mg by mouth 3 (three) times daily with meals.     . calcium acetate (PHOSLO) 667 MG capsule Take 667 mg by mouth with snacks.    . docusate sodium (COLACE) 100 MG capsule Take 100 mg by mouth 2 (two) times daily as needed for mild constipation.    Marland Kitchen doxycycline (VIBRA-TABS) 100 MG tablet Take 100 mg by mouth 2 (two) times daily. For 10 days started 06/13/13    . hydrALAZINE (APRESOLINE) 25 MG tablet Take 25 mg by mouth See admin instructions. Take 25 mg twice daily after dialysis days on Tuesday Thursday and Saturday    . HYDROcodone-acetaminophen (NORCO) 10-325 MG per tablet Take one tablet by mouth every four hours around the clock for pain 180 tablet 0  . levothyroxine (SYNTHROID, LEVOTHROID) 100 MCG tablet Take 100 mcg by mouth daily before breakfast.    . lisinopril (PRINIVIL,ZESTRIL) 40 MG tablet Take 40 mg by mouth at bedtime.    . mirtazapine (REMERON) 15 MG tablet Take 15 mg by mouth at bedtime.    . Multiple Vitamins-Minerals (CERTA-VITE PO) Take 1 tablet by mouth daily.    Marland Kitchen oxyCODONE (OXY IR/ROXICODONE) 5 MG immediate release tablet Take 1 tablet (5 mg total) by mouth every 4 (four) hours as needed for severe pain. (Patient not taking: Reported  on 04/24/2014) 180 tablet 0  . polyethylene glycol (MIRALAX / GLYCOLAX) packet Take 17 g by mouth daily as needed for mild constipation or moderate constipation.    . traMADol (ULTRAM) 50 MG tablet Take 50 mg by mouth 3 (three) times daily.     No current facility-administered medications on file prior to visit.    Physical exam:  Filed Vitals:   04/24/14 1433  BP: 146/75  Pulse: 84  Resp: 16  Height: 5\' 2"  (1.575 m)  Weight: 114 lb (51.71 kg)    Right lower extremity: Dry gangrenous changes all 5 toes right foot, no palpable pedal pulses, 2+ femoral pulses bilaterally, left foot with dependent edema and some rubor of the toes  Chest: Clear to auscultation  bilaterally, right side dialysis catheter  Cardiac: Regular rate and rhythm  Assessment:  Patient with dementia not a revascularization candidate. The patient was previously offered a right above-knee amputation and refused. I again offered the patient a right above-knee amputation today.   Plan: At this point the patient has agreed to a right above-knee amputation. This is scheduled for 05/06/2014. Risks benefits possible complications and procedure details were explained the patient today including not limited to bleeding infection risk of nonhealing less than 1%.  Ruta Hinds, MD [facility] [place] Office: [phone] Pager: [phone]

## 2014-05-06 NOTE — Progress Notes (Signed)
Pt ran 4 hour on hemodialysis. Goal was 3-4L. Not able to meet goal due to pt having some hypotensive episodes. Pt was also in pain from her amputation and received pain med during her Tx. Was able to pull 2392cc of fluid

## 2014-05-06 NOTE — Interval H&P Note (Signed)
History and Physical Interval Note:  05/06/2014 9:34 AM  Nicole [name]  has presented today for surgery, with the diagnosis of Peripheral Vascular Disease with Gangrene right foot I70.261  The various methods of treatment have been discussed with the patient and family. After consideration of risks, benefits and other options for treatment, the patient has consented to  Procedure(s): AMPUTATION ABOVE KNEE (Right) as a surgical intervention .  The patient's history has been reviewed, patient examined, no change in status, stable for surgery.  I have reviewed the patient's chart and labs.  Questions were answered to the patient's satisfaction.     FIELDS,CHARLES E

## 2014-05-06 NOTE — Interval H&P Note (Signed)
History and Physical Interval Note:  05/06/2014 9:42 AM  Nicole [name]  has presented today for surgery, with the diagnosis of Peripheral Vascular Disease with Gangrene right foot I70.261  The various methods of treatment have been discussed with the patient and family. After consideration of risks, benefits and other options for treatment, the patient has consented to  Procedure(s): AMPUTATION ABOVE KNEE (Right) as a surgical intervention .  The patient's history has been reviewed, patient examined, no change in status, stable for surgery.  I have reviewed the patient's chart and labs.  Questions were answered to the patient's satisfaction.     Passion Lavin E

## 2014-05-07 ENCOUNTER — Encounter (HOSPITAL_COMMUNITY): Payer: Self-pay | Admitting: General Practice

## 2014-05-07 LAB — CBC
HCT: 28.8 % — ABNORMAL LOW (ref 36.0–46.0)
HEMATOCRIT: 30.5 % — AB (ref 36.0–46.0)
HEMOGLOBIN: 9.6 g/dL — AB (ref 12.0–15.0)
Hemoglobin: 9.1 g/dL — ABNORMAL LOW (ref 12.0–15.0)
MCH: 34.6 pg — ABNORMAL HIGH (ref 26.0–34.0)
MCH: 35.7 pg — ABNORMAL HIGH (ref 26.0–34.0)
MCHC: 31.5 g/dL (ref 30.0–36.0)
MCHC: 31.6 g/dL (ref 30.0–36.0)
MCV: 113.4 fL — ABNORMAL HIGH (ref 78.0–100.0)
MCV: 109.5 fL — AB (ref 78.0–100.0)
Platelets: 368 10*3/uL (ref 150–400)
Platelets: 383 10*3/uL (ref 150–400)
RBC: 2.63 MIL/uL — ABNORMAL LOW (ref 3.87–5.11)
RBC: 2.69 MIL/uL — AB (ref 3.87–5.11)
RDW: 15.6 % — ABNORMAL HIGH (ref 11.5–15.5)
RDW: 15.2 % (ref 11.5–15.5)
WBC: 10.1 10*3/uL (ref 4.0–10.5)
WBC: 10.9 10*3/uL — ABNORMAL HIGH (ref 4.0–10.5)

## 2014-05-07 LAB — BASIC METABOLIC PANEL
ANION GAP: 15 (ref 5–15)
BUN: 9 mg/dL (ref 6–23)
CO2: 30 mmol/L (ref 19–32)
CREATININE: 4.06 mg/dL — AB (ref 0.50–1.10)
Calcium: 8.5 mg/dL (ref 8.4–10.5)
Chloride: 96 mmol/L (ref 96–112)
GFR calc Af Amer: 11 mL/min — ABNORMAL LOW (ref >90)
GFR, EST NON AFRICAN AMERICAN: 10 mL/min — AB (ref >90)
GLUCOSE: 98 mg/dL (ref 70–99)
Potassium: 4.3 mmol/L (ref 3.5–5.1)
Sodium: 141 mmol/L (ref 135–145)

## 2014-05-07 LAB — GLUCOSE, CAPILLARY
GLUCOSE-CAPILLARY: 97 mg/dL (ref 70–99)
GLUCOSE-CAPILLARY: 124 mg/dL — AB (ref 70–99)

## 2014-05-07 MED ORDER — RENA-VITE PO TABS
1.0000 | ORAL_TABLET | Freq: Every day | ORAL | Status: DC
  Administered 2014-05-07: 1 via ORAL
  Administered 2014-05-08: 22:00:00 via ORAL
  Administered 2014-05-09: 1 via ORAL
  Filled 2014-05-07 (×4): qty 1

## 2014-05-07 MED ORDER — DOXERCALCIFEROL 4 MCG/2ML IV SOLN
2.0000 ug | INTRAVENOUS | Status: DC
  Administered 2014-05-08 – 2014-05-10 (×2): 2 ug via INTRAVENOUS
  Filled 2014-05-07 (×2): qty 2

## 2014-05-07 MED ORDER — VANCOMYCIN HCL 500 MG IV SOLR
500.0000 mg | INTRAVENOUS | Status: DC
  Administered 2014-05-08 – 2014-05-10 (×2): 500 mg via INTRAVENOUS
  Filled 2014-05-07 (×7): qty 500

## 2014-05-07 MED ORDER — LANTHANUM CARBONATE 500 MG PO CHEW
1000.0000 mg | CHEWABLE_TABLET | Freq: Three times a day (TID) | ORAL | Status: DC
  Administered 2014-05-07 – 2014-05-10 (×7): 1000 mg via ORAL
  Filled 2014-05-07 (×14): qty 2

## 2014-05-07 NOTE — Progress Notes (Addendum)
  [facility] Progress Note  05/07/2014 7:58 AM 1 Day Post-Op  Subjective:  Slept well last night. No complaints   Filed Vitals:   05/06/14 2329  BP: 153/67  Pulse:   Temp:   Resp:     Physical Exam: Dried sanguinous drainage on dressing. Dressing taken down. Staple line is clean and intact. Stump is warm and soft. No active bleeding. No edema.  Left groin with serosanguinous drainage on dressing. Some purulent drainage. No graft exposed.   CBC    Component Value Date/Time   WBC 10.1 05/07/2014 0417   RBC 2.69* 05/07/2014 0417   RBC 3.30* 12/14/2009 1909   HGB 9.6* 05/07/2014 0417   HCT 30.5* 05/07/2014 0417   PLT 383 05/07/2014 0417   MCV 113.4* 05/07/2014 0417   MCH 35.7* 05/07/2014 0417   MCHC 31.5 05/07/2014 0417   RDW 15.6* 05/07/2014 0417   LYMPHSABS 1.9 02/13/2013 2120   MONOABS 0.7 02/13/2013 2120   EOSABS 0.5 02/13/2013 2120   BASOSABS 0.0 02/13/2013 2120    BMET    Component Value Date/Time   NA 141 05/07/2014 0417   K 4.3 05/07/2014 0417   CL 96 05/07/2014 0417   CO2 30 05/07/2014 0417   GLUCOSE 98 05/07/2014 0417   BUN 9 05/07/2014 0417   CREATININE 4.06* 05/07/2014 0417   CALCIUM 8.5 05/07/2014 0417   CALCIUM 9.1 09/12/2012 1257   GFRNONAA 10* 05/07/2014 0417   GFRAA 11* 05/07/2014 0417    INR    Component Value Date/Time   INR 1.08 05/06/2014 0825     Intake/Output Summary (Last 24 hours) at 05/07/14 0758 Last data filed at 05/06/14 2229  Gross per 24 hour  Intake    350 ml  Output   2442 ml  Net  -2092 ml     Assessment:  81 y.o. female is s/p: right above-knee amputation  1 Day Post-Op  Plan: -Dressing taken down due to dried sanguinous drainage seen on bandage. Stump is viable with no active bleeding. No edema.  -Continue vancomycin for abscess. Wound culture pending.  -Continue pain control.  -Had dialysis yesterday for high K+. Plans for dialysis tomorrow.  -Patient is from nursing facility. CSW for  coordination back to SNF. Likely d/c tomorrow.    Virgina Jock, PA-C [facility] Office: [phone] Pager: [phone] 05/07/2014 7:58 AM     Spoke with Dr. Jimmy Footman regarding dialysis permanent access He wants to try a thigh graft on the right LE once the AKA heals 6-8 weeks.  There was a concern for steal and ischemic changes to the right foot prior to this amputation.  I will discuss this with Dr. Oneida Alar.  COLLINS, EMMA MAUREEN PA-C   Left AKA pain controlled Left groin abcess wound clean today no exposed graft currently.  Will do wet to dry dressings for now. Consider VAC if cleans up.  Follow up cultures No plans to place thigh graft as risk of infecting her aortobifem is too high. D/c home when pain controlled Change AkA dressing tomorrow.  Ruta Hinds, MD [facility] [place] Office: [phone] Pager: [phone]

## 2014-05-07 NOTE — Evaluation (Signed)
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 PMHx for ESRD, PVD, anemia, AAA from [place]   Pt was performing self care and ADL transfers at a modified independent level prior to admission. Pt presents with increased R LE pain limiting activity tolerance.  Pt required max assist for bed mobility and sat EOB with supervision x 10 minutes. Pt was not able to perform a transfers today.  Will benefit from acute OT.  Recommend return to SNF and short term rehab.    Follow Up Recommendations  SNF    Equipment Recommendations       Recommendations for Other Services       Precautions / Restrictions Precautions Precautions: Fall Precaution Comments: R AKA Restrictions RLE Weight Bearing: Non weight bearing      Mobility Bed Mobility Overal bed mobility: Needs Assistance Bed Mobility: Supine to Sit;Sit to Supine     Supine to sit: Max assist;HOB elevated Sit to supine: Mod assist;HOB elevated   General bed mobility comments: Heavy reliance on rail, assisted hips with pad, assisted L LE up in bed  Transfers Overall transfer level:  (unable today)               General transfer comment: Pt declined, due to pain.    Balance Overall balance assessment: Needs assistance Sitting-balance support: Feet supported Sitting balance-Leahy Scale: Fair Sitting balance - Comments: Sat ~ 10 minutes at EOB with supervision, flexed posture                                    ADL Overall ADL's : Needs assistance/impaired Eating/Feeding: Set up;Bed level   Grooming: Sitting;Min guard   Upper Body Bathing: Moderate assistance;Sitting   Lower Body Bathing: Maximal assistance;Bed level   Upper Body Dressing : Minimal assistance;Sitting   Lower Body Dressing:  Maximal assistance;Sit to/from stand                       Vision     Perception     Praxis      Pertinent Vitals/Pain Pain Assessment: Faces Faces Pain Scale: Hurts worst Pain Location: R LE with mobility, particularly rolling to R Pain Descriptors / Indicators: Grimacing;Crying;Operative site guarding Pain Intervention(s): Limited activity within patient's tolerance;Monitored during session;Premedicated before session     Hand Dominance Right   Extremity/Trunk Assessment Upper Extremity Assessment Upper Extremity Assessment: RUE deficits/detail;LUE deficits/detail   Lower Extremity Assessment Lower Extremity Assessment: Defer to PT evaluation RLE Deficits / Details: increased pain       Communication Communication Communication: No difficulties   Cognition Arousal/Alertness: Awake/alert Behavior During Therapy: Flat affect Overall Cognitive Status: Within Functional Limits for tasks assessed                     General Comments       Exercises       Shoulder Instructions      Home Living Family/patient expects to be discharged to:: Skilled nursing facility                                 Additional Comments: pt is a resident of [facility]      Prior Functioning/Environment Level of Independence: Needs assistance  Gait / Transfers Assistance Needed: pt reports she primarily uses a w/c for mobility ADL's / Homemaking Assistance Needed: bathes, dresses and transfers to toilet independently        OT Diagnosis: Generalized weakness;Acute pain   OT Problem List: Decreased strength;Decreased activity tolerance;Impaired balance (sitting and/or standing);Decreased knowledge of use of DME or AE;Pain   OT Treatment/Interventions: Self-care/ADL training;Balance training;Patient/family education;DME and/or AE instruction;Therapeutic activities    OT Goals(Current goals can be found in the care plan section) Acute Rehab OT  Goals Patient Stated Goal: be out of pain OT Goal Formulation: With patient Time For Goal Achievement: 05/21/14 Potential to Achieve Goals: Good ADL Goals Pt Will Perform Grooming: with supervision;sitting Pt Will Perform Upper Body Bathing: with supervision;sitting Pt Will Perform Upper Body Dressing: with supervision;sitting Pt Will Transfer to Toilet: with min assist;stand pivot transfer;bedside commode Pt Will Perform Toileting - Clothing Manipulation and hygiene: with min assist;sit to/from stand Pt/caregiver will Perform Home Exercise Program: Increased strength;Both right and left upper extremity;With theraband;With written HEP provided Additional ADL Goal #1: Pt will perform bed mobility with min guard assist in preparation for ADL at EOB.  OT Frequency: Min 2X/week   Barriers to D/C:            Co-evaluation              End of Session    Activity Tolerance: Patient limited by pain Patient left: in bed;with call bell/phone within reach;with chair alarm set   Time: 1310-1340 OT Time Calculation (min): 30 min Charges:  OT General Charges $OT Visit: 1 Procedure OT Evaluation $Initial OT Evaluation Tier I: 1 Procedure OT Treatments $Therapeutic Activity: 8-22 mins G-Codes:    [name] 05/07/2014, 3:27 PM  [phone]

## 2014-05-07 NOTE — Progress Notes (Signed)
Subjective: Interval History: has no complaint, tired but feels better.  Objective: Vital signs in last 24 hours: Temp:  [97.6 F (36.4 C)-99.2 F (37.3 C)] 99.2 F (37.3 C) (02/29 2229) Pulse Rate:  [41-89] 77 (02/29 2240) Resp:  [13-30] 14 (02/29 2240) BP: (64-180)/(21-95) 153/67 mmHg (02/29 2329) SpO2:  [97 %-100 %] 97 % (02/29 2229) Weight:  [50 kg (110 lb 3.7 oz)-51.9 kg (114 lb 6.7 oz)] 50 kg (110 lb 3.7 oz) (02/29 2229) Weight change:   Intake/Output from previous day: 02/29 0701 - 03/01 0700 In: 350 [I.V.:350] Out: 2442 [Blood:50] Intake/Output this shift:    General appearance: cooperative and slowed mentation Resp: clear to auscultation bilaterally Chest wall: RIJ cath Cardio: S1, S2 normal and systolic murmur: holosystolic 2/6, blowing at apex GI: pos bs, soft, liver down 4 cm Extremities: RAKA, R groin briut  Lab Results:  Recent Labs  05/06/14 2353 05/07/14 0417  WBC 10.9* 10.1  HGB 9.1* 9.6*  HCT 28.8* 30.5*  PLT 368 383   BMET:  Recent Labs  05/06/14 1600 05/07/14 0417  NA 139 141  K 6.0* 4.3  CL 103 96  CO2 24 30  GLUCOSE 107* 98  BUN 46* 9  CREATININE 9.71* 4.06*  CALCIUM 8.4 8.5   No results for input(s): PTH in the last 72 hours. Iron Studies: No results for input(s): IRON, TIBC, TRANSFERRIN, FERRITIN in the last 72 hours.  Studies/Results: No results found.  I have reviewed the patient's current medications.  Assessment/Plan: 1 ESRD HD last pm for ^ K.  Will try to hold off until Wed to get back on sched 2 Anemia epo 3 PVD s/P AKA 4 HPTH Vit D 5 Access  Hopefully can now use R groin in 6-8 wk for access P PT, pain control, follow chem, adjust meds, HD on Wed    LOS: 1 day   Lulubelle Simcoe L 05/07/2014,7:14 AM

## 2014-05-07 NOTE — Clinical Social Work Note (Signed)
Patient confirms that she is from [facility] and would like to return there at time of discharge- full assessment to follow.  Nicole [name], [place] Social Worker [phone]

## 2014-05-07 NOTE — Evaluation (Signed)
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 PMHx for ESRD, PVD, anemia, AAA from [place]  Pt pleasant but reports very difficult time controlling her pain. Pt premedicated and initially assisting and performing scooting to EOB well but then pt developed increased pain and stated she could not tolerate further mobility and returned to bed. Pt encouraged to move RLE for amputee exercises as well as to assist with nursing mobility. Pt with these and all deficits listed below who will benefit from acute therapy to maximize mobility, function, transfers and safety to decrease burden of care.      Follow Up Recommendations SNF;Supervision/Assistance - 24 hour    Equipment Recommendations  None recommended by PT    Recommendations for Other Services       Precautions / Restrictions Precautions Precautions: Fall Precaution Comments: R AKA Restrictions RLE Weight Bearing: Non weight bearing      Mobility  Bed Mobility Overal bed mobility: Needs Assistance Bed Mobility: Supine to Sit     Supine to sit: Max assist     General bed mobility comments: Attempted pivot to EOB but pt very fearful of pain. Pt assisting with slight elevation of sacrum to scoot segmentally to EOB with use of pad. Achieved half of distance to EOB and pt stated too much pain. Scooted back into bed and toward HOE with max assist of pad. Pt willing to try but very limited by pain despite premedication  Transfers Overall transfer level:  (unable today)                  Ambulation/Gait                Stairs            Wheelchair Mobility    Modified Rankin (Stroke Patients Only)       Balance                                             Pertinent Vitals/Pain Pain  Assessment: 0-10 Pain Location: pt states 0/10 at rest but 10/10 with increased activity and back to 1/10 end of session RLE Pain Intervention(s): Limited activity within patient's tolerance;Premedicated before session;Repositioned    Home Living Family/patient expects to be discharged to:: Skilled nursing facility                      Prior Function Level of Independence: Needs assistance   Gait / Transfers Assistance Needed: Pt states she normally walks with RW, WC for long distances  ADL's / Homemaking Assistance Needed: pt states she sponges bathes and transfers to toilet on her own        Hand Dominance        Extremity/Trunk Assessment   Upper Extremity Assessment: Generalized weakness           Lower Extremity Assessment: Generalized weakness;RLE deficits/detail RLE Deficits / Details: Pt would not tolerate any ROM to RLE at this time       Communication   Communication: No difficulties  Cognition Arousal/Alertness: Awake/alert Behavior During Therapy: Flat affect Overall Cognitive Status: Within Functional Limits for tasks assessed  General Comments      Exercises        Assessment/Plan    PT Assessment Patient needs continued PT services  PT Diagnosis Generalized weakness;Acute pain;Difficulty walking   PT Problem List Decreased strength;Decreased activity tolerance;Decreased mobility;Decreased range of motion;Decreased knowledge of use of DME;Pain;Decreased skin integrity  PT Treatment Interventions DME instruction;Therapeutic exercise;Therapeutic activities;Functional mobility training;Balance training;Patient/family education;Gait training   PT Goals (Current goals can be found in the Care Plan section) Acute Rehab PT Goals Patient Stated Goal: be out of pain PT Goal Formulation: With patient Time For Goal Achievement: 05/21/14 Potential to Achieve Goals: Fair    Frequency Min 3X/week   Barriers to  discharge        Co-evaluation               End of Session   Activity Tolerance: Patient limited by pain Patient left: in bed;with call bell/phone within reach;with bed alarm set Nurse Communication: Mobility status         Time: 1012-1028 PT Time Calculation (min) (ACUTE ONLY): 16 min   Charges:   PT Evaluation $Initial PT Evaluation Tier I: 1 Procedure     PT G CodesMelford Aase 05/07/2014, 12:14 PM Elwyn Reach, [place]

## 2014-05-08 ENCOUNTER — Inpatient Hospital Stay (HOSPITAL_COMMUNITY): Payer: Medicare Other

## 2014-05-08 LAB — CBC
HCT: 28.8 % — ABNORMAL LOW (ref 36.0–46.0)
HEMOGLOBIN: 9.1 g/dL — AB (ref 12.0–15.0)
MCH: 36 pg — ABNORMAL HIGH (ref 26.0–34.0)
MCHC: 31.6 g/dL (ref 30.0–36.0)
MCV: 113.8 fL — ABNORMAL HIGH (ref 78.0–100.0)
PLATELETS: 364 10*3/uL (ref 150–400)
RBC: 2.53 MIL/uL — ABNORMAL LOW (ref 3.87–5.11)
RDW: 15.5 % (ref 11.5–15.5)
WBC: 9.3 10*3/uL (ref 4.0–10.5)

## 2014-05-08 LAB — RENAL FUNCTION PANEL
ALBUMIN: 2.6 g/dL — AB (ref 3.5–5.2)
Anion gap: 12 (ref 5–15)
BUN: 25 mg/dL — ABNORMAL HIGH (ref 6–23)
CALCIUM: 8.3 mg/dL — AB (ref 8.4–10.5)
CHLORIDE: 96 mmol/L (ref 96–112)
CO2: 29 mmol/L (ref 19–32)
Creatinine, Ser: 7.2 mg/dL — ABNORMAL HIGH (ref 0.50–1.10)
GFR, EST AFRICAN AMERICAN: 6 mL/min — AB (ref >90)
GFR, EST NON AFRICAN AMERICAN: 5 mL/min — AB (ref >90)
Glucose, Bld: 121 mg/dL — ABNORMAL HIGH (ref 70–99)
Phosphorus: 10 mg/dL — ABNORMAL HIGH (ref 2.3–4.6)
Potassium: 4.4 mmol/L (ref 3.5–5.1)
Sodium: 137 mmol/L (ref 135–145)

## 2014-05-08 MED ORDER — DOXERCALCIFEROL 4 MCG/2ML IV SOLN
INTRAVENOUS | Status: AC
  Filled 2014-05-08: qty 2

## 2014-05-08 MED ORDER — ENSURE PUDDING PO PUDG
1.0000 | ORAL | Status: DC
  Administered 2014-05-09 – 2014-05-10 (×2): 1 via ORAL

## 2014-05-08 MED ORDER — BOOST / RESOURCE BREEZE PO LIQD
1.0000 | ORAL | Status: DC
  Administered 2014-05-10: 1 via ORAL

## 2014-05-08 MED ORDER — NEPRO/CARBSTEADY PO LIQD
237.0000 mL | ORAL | Status: DC
  Filled 2014-05-08 (×3): qty 237

## 2014-05-08 MED ORDER — IOHEXOL 350 MG/ML SOLN
100.0000 mL | Freq: Once | INTRAVENOUS | Status: AC | PRN
  Administered 2014-05-08: 100 mL via INTRAVENOUS

## 2014-05-08 MED ORDER — PRO-STAT SUGAR FREE PO LIQD
30.0000 mL | Freq: Every day | ORAL | Status: DC
  Administered 2014-05-09 – 2014-05-10 (×2): 30 mL via ORAL
  Filled 2014-05-08 (×4): qty 30

## 2014-05-08 NOTE — Procedures (Signed)
I was present at this session.  I have reviewed the session itself and made appropriate changes.  Bp Mid 100s, cath flow 400.  Tol well  Randilyn Foisy L 3/2/20165:50 PM

## 2014-05-08 NOTE — Progress Notes (Signed)
Subjective: Interval History: has no complaint of wants to get up, will go to HD in bed.  Objective: Vital signs in last 24 hours: Temp:  [98.8 F (37.1 C)-99.6 F (37.6 C)] 99.6 F (37.6 C) (03/02 0525) Pulse Rate:  [57-68] 67 (03/02 0525) Resp:  [16-18] 16 (03/02 0525) BP: (137-142)/(50-57) 139/57 mmHg (03/02 0525) SpO2:  [93 %-98 %] 98 % (03/02 0525) Weight change:   Intake/Output from previous day: 03/01 0701 - 03/02 0700 In: 653 [P.O.:653] Out: -  Intake/Output this shift:    General appearance: alert and cooperative Resp: clear to auscultation bilaterally Chest wall: RIJ cath Cardio: S1, S2 normal, systolic murmur: holosystolic 2/6, blowing at apex and occ premature GI: soft, non-tender; bowel sounds normal; no masses,  no organomegaly Extremities: R LE amp wrapped  Lab Results:  Recent Labs  05/07/14 0417 05/08/14 0500  WBC 10.1 9.3  HGB 9.6* 9.1*  HCT 30.5* 28.8*  PLT 383 364   BMET:  Recent Labs  05/07/14 0417 05/08/14 0500  NA 141 137  K 4.3 4.4  CL 96 96  CO2 30 29  GLUCOSE 98 121*  BUN 9 25*  CREATININE 4.06* 7.20*  CALCIUM 8.5 8.3*   No results for input(s): PTH in the last 72 hours. Iron Studies: No results for input(s): IRON, TIBC, TRANSFERRIN, FERRITIN in the last 72 hours.  Studies/Results: No results found.  I have reviewed the patient's current medications.  Assessment/Plan: 1  ESRD for HD 2 anemia stable, cont epo 3 DM controlled 4 HPTH on meds 5 PVD per [facility] 6 Access need to consider groin AVG. Risk of chronic catheter significant and probably outweighs risk of injury/infx of ABFGs 7 HTN controlled on less meds P HD, epo, mobilize    LOS: 2 days   Maleeya Peterkin L 05/08/2014,7:07 AM

## 2014-05-08 NOTE — Progress Notes (Addendum)
[facility]  Subjective  - right leg still hurts but a little better   Objective 139/57 67 99.6 F (37.6 C) (Oral) 16 98%  Intake/Output Summary (Last 24 hours) at 05/08/14 0831 Last data filed at 05/08/14 0700  Gross per 24 hour  Intake    653 ml  Output      0 ml  Net    653 ml   Right AKA healing Left groin still with purulent yellow drainage primarily from lower aspect of wound  Assessment/Planning: Continue Vancomycin CT Angio with runoff today to evaluate graft and runoff Check prealbumin/albumin continue to push nutrition for wound healing.  Pt has severe protein calorie malnutrition.  Family states pt eating minimal. Cultures negative to date from left groin most likely from antibiotics masking  [name],[name] 05/08/2014 8:31 AM --  Laboratory Lab Results:  Recent Labs  05/07/14 0417 05/08/14 0500  WBC 10.1 9.3  HGB 9.6* 9.1*  HCT 30.5* 28.8*  PLT 383 364   BMET  Recent Labs  05/07/14 0417 05/08/14 0500  NA 141 137  K 4.3 4.4  CL 96 96  CO2 30 29  GLUCOSE 98 121*  BUN 9 25*  CREATININE 4.06* 7.20*  CALCIUM 8.5 8.3*    COAG Lab Results  Component Value Date   INR 1.08 05/06/2014   INR 1.03 06/20/2013   INR 0.97 09/06/2012   No results found for: PTT

## 2014-05-08 NOTE — Progress Notes (Addendum)
INITIAL NUTRITION ASSESSMENT  DOCUMENTATION CODES Per approved criteria  -Severe malnutrition in the context of chronic illness  Pt meets criteria for SEVERE MALNUTRITION in the context of CHRONIC ILLNESS as evidenced by estimated energy intake <75% of estimated energy needs for > 1 month and severe muscle wasting per physical exam.  INTERVENTION: Provide Resource Breeze once daily (197 ml water), provides 250 kcal and 9 grams of protein Provide Nepro Shakes once daily (173 ml water), provides 425 kcal and 19 grams of protein Provide Pro-stat once daily, provides 100 kcal and 15 grams of protein Provide Magic Cup once daily with dinner, provides 290 kcal and 9 grams of protein Continue Ensure Pudding once daily, provides 170 kcal and 4 grams of protein Provide Snack HS Encourage PO intake   NUTRITION DIAGNOSIS: Inadequate oral intake related to poor appetite as evidenced by <50% meal completion for > 1 month.   Goal: Pt to meet >/= 90% of their estimated nutrition needs   Monitor:  PO intake, supplement acceptance, weight trend, labs, I/O's  Reason for Assessment: Low [place]  81 y.o. 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 PMHx for ESRD, PVD, anemia, and AAA.   Per nursing notes pt has been consuming 0% to 10% of meals. Pt states she has had a very poor appetite for over a months and typically only eats a few bites of food at each meal. She reports snacking in between meals PTA; RD offered pt snacks but, all she requested was potato chips and Cheetos. Pt is agreeable to trying a variety of nutritional supplements to improve nutrition intake. RD encouraged pt to at least try to eat a few bites at each meal.   Labs: high creatinine, low GFR, elevated phosphorus, low hemoglobin  Nutrition Focused Physical Exam:  Subcutaneous Fat:  Orbital Region: mild wasting Upper Arm Region: moderate  wasting Thoracic and Lumbar Region: NA  Muscle:  Temple Region: mild wasting Clavicle Bone Region: moderate wasting Clavicle and Acromion Bone Region: mild wasting Scapular Bone Region: NA Dorsal Hand: severe wasting Patellar Region: severe wasting Anterior Thigh Region: moderate wasting Posterior Calf Region: severe wasting  Edema: none noted   Height: Ht Readings from Last 1 Encounters:  05/06/14 5\' 2"  (1.575 m)    Weight: Wt Readings from Last 1 Encounters:  05/06/14 110 lb 3.7 oz (50 kg)    Ideal Body Weight: 110 lbs  % Ideal Body Weight: 100%  Wt Readings from Last 10 Encounters:  05/06/14 110 lb 3.7 oz (50 kg)  04/24/14 114 lb (51.71 kg)  02/20/14 111 lb (50.349 kg)  12/29/13 112 lb (50.803 kg)  12/19/13 110 lb (49.896 kg)  11/22/13 110 lb (49.896 kg)  10/29/13 110 lb (49.896 kg)  10/26/13 107 lb (48.535 kg)  09/12/13 112 lb (50.803 kg)  08/17/13 115 lb (52.164 kg)    Usual Body Weight: 110-115 lbs  % Usual Body Weight: 100%  BMI:  Body mass index is 20.16 kg/(m^2).  Estimated Nutritional Needs: Kcal: 1400-1600 Protein: 60-70 grams Fluid: 1.2 L/day restriction  Skin: sacral pressure ulcer; closed incisions on left leg ad left groin  Diet Order: Diet renal W/1241mL fluid restriction  EDUCATION NEEDS: -No education needs identified at this time   Intake/Output Summary (Last 24 hours) at 05/08/14 1516 Last data filed at 05/08/14 1100  Gross per 24 hour  Intake    673 ml  Output  0 ml  Net    673 ml    Last BM: 3/2  Labs:   Recent Labs Lab 05/06/14 1600 05/07/14 0417 05/08/14 0500  NA 139 141 137  K 6.0* 4.3 4.4  CL 103 96 96  CO2 24 30 29   BUN 46* 9 25*  CREATININE 9.71* 4.06* 7.20*  CALCIUM 8.4 8.5 8.3*  PHOS 8.2*  --  10.0*  GLUCOSE 107* 98 121*    CBG (last 3)   Recent Labs  05/06/14 1646 05/07/14 0657 05/07/14 1632  GLUCAP 115* 97 124*    Scheduled Meds: . amLODipine  10 mg Oral QHS  . [START ON  05/09/2014] darbepoetin (ARANESP) injection - DIALYSIS  100 mcg Intravenous Q Thu-HD  . docusate sodium  100 mg Oral Daily  . doxercalciferol  2 mcg Intravenous Q M,W,F-HD  . feeding supplement (ENSURE)  1 Container Oral TID WC  . heparin  5,000 Units Subcutaneous 3 times per day  . lanthanum  1,000 mg Oral TID WC  . levothyroxine  150 mcg Oral QAC breakfast  . lisinopril  40 mg Oral QHS  . multivitamin  1 tablet Oral QHS  . pantoprazole  40 mg Oral Daily  . vancomycin  500 mg Intravenous Q M,W,F-HD    Continuous Infusions: . sodium chloride      Past Medical History  Diagnosis Date  . Hypertension   . ESRD (end stage renal disease)   . Peripheral vascular disease     revascularization appx. 14 years ago  . Hypothyroidism   . Hiatal hernia   . Iron deficiency anemia   . Osteoarthritis   . Depression with anxiety   . Pulmonary embolism   . AAA (abdominal aortic aneurysm) 1999    aortic byfem bypass  . Anemia in chronic kidney disease(285.21)   . Spinal stenosis, lumbar region, without neurogenic claudication   . Other specified disease of white blood cells   . Hypopotassemia   . Other abnormal blood chemistry   . Macrocytosis   . Depression   . Anxiety   . Bradycardia   . H/O prolonged Q-T interval on ECG     Referral to EP    Past Surgical History  Procedure Laterality Date  . Abdominal surgery  about 14 years ago    REVASCULARIZATION  . Ateriovenous graft  04/18/2009    LEFT FOREARM avg  . Av fistula placement, radiocephalic  09/16/17  . Avgg removal  12/02/09    SECONDARY TO INFECTION LT FOREARM  . Av fistula placement, radiocephalic  09/10/86    NONMATURED  . Arteriovenous graft placement  07/24/10    RIGHT FOREARM LOOP  . Dg av dialysis graft declot or  12/05/10, 6/17/123, 09/24/11, 01/26/12    CLOTTED GRAFT  . Angioplasty  12/21/11    of RUE AVG  . Thrombectomy femoral artery Left 06/20/2013    Procedure: THROMBECTOMY FEMORAL ARTERY;  Surgeon: Elam Dutch, MD;  Location: [facility];  Service: Vascular;  Laterality: Left;  . Abdominal aortagram N/A 06/20/2013    Procedure: ABDOMINAL Maxcine Ham;  Surgeon: Serafina Mitchell, MD;  Location: [facility] CATH LAB;  Service: Cardiovascular;  Laterality: N/A;  . Above knee leg amputation Right 05/06/2014    DR FIELDS  . Amputation Right 05/06/2014    Procedure: AMPUTATION ABOVE RIGHT KNEE;  Surgeon: Elam Dutch, MD;  Location: [place];  Service: Vascular;  Laterality: Right;  . Incision and drainage abscess Left 05/06/2014    Procedure:  INCISION AND DRAINAGE ABSCESS LEFT GROIN;  Surgeon: Elam Dutch, MD;  Location: [facility];  Service: Vascular;  Laterality: Left;    Pryor Ochoa RD, LDN Inpatient Clinical Dietitian Pager: [phone] After Hours Pager: [phone]

## 2014-05-09 DIAGNOSIS — E43 Unspecified severe protein-calorie malnutrition: Secondary | ICD-10-CM | POA: Insufficient documentation

## 2014-05-09 LAB — PREALBUMIN: Prealbumin: 27.1 mg/dL (ref 17.0–34.0)

## 2014-05-09 LAB — CULTURE, ROUTINE-ABSCESS: Culture: NO GROWTH

## 2014-05-09 LAB — ALBUMIN: Albumin: 2.6 g/dL — ABNORMAL LOW (ref 3.5–5.2)

## 2014-05-09 MED ORDER — LISINOPRIL 20 MG PO TABS
20.0000 mg | ORAL_TABLET | Freq: Every day | ORAL | Status: DC
  Administered 2014-05-09: 20 mg via ORAL
  Filled 2014-05-09 (×2): qty 1

## 2014-05-09 NOTE — Progress Notes (Addendum)
   [facility]  Subjective  - Didn't sleep well last night.  My stump was not wrapped and it hurt me all night.    Objective 145/50 80 99.6 F (37.6 C) (Oral) 18 95%  Intake/Output Summary (Last 24 hours) at 05/09/14 0827 Last data filed at 05/08/14 2043  Gross per 24 hour  Intake    100 ml  Output   1000 ml  Net   -900 ml   CTA: Right Lower Extremity: There is flow in the right profunda femoral arteries. The proximal right superficial femoral artery is patent with segmental disease in the right SFA. Areas of occlusion and critical stenosis in knee right SFA. The distal right SFA is Occluded Left Lower Extremity: There is an open wound in the left groin. There is soft tissue thickening or fluid around the distal left aortofemoral limb. No large abscess formation in this location. The left superficial femoral artery is occluded. There is flow in the left profunda femoral arteries. Reconstitution at the left adductor canal. The left popliteal artery has segmental disease but it is patent. Atherosclerotic disease involving the left calf runoff vessels. The proximal calf vessels are patent. There is occlusion of the mid posterior tibial artery. Primary runoff is the peroneal artery. The anterior tibial artery occludes in the distal aspect. There is distal reconstitution of the anterior tibial artery and dorsalis pedis artery. There is some distal reconstitution of the posterior tibial artery. Right stump healing well no drainage, re-wrapped with ace Left groin wet to dry re-dressing.  Yellow discharge on old packing.   Assessment/Planning: Right AKA healing well will order [employer] sock today  Will discuss findings of CTA with Dr. Oneida Alar Cultures negative final still pending, cont. IV Vancomyocin. Will cont. Vancomycin at dialysis Plan D/C tomorrow [facility]   [facility] 05/09/2014 8:27 AM -- Exam as above CTA reviewed.   No perigraft air no focal fluid collection.  Psoas/disc findings as likely to be chronic as they are acute/subacute Regardless she is in no shape to consider graft removal currently. Will treat with Vancomycin on dialysis for 6 weeks and repeat CT in 4-6 weeks Continue local wound care left groin AKA healing well so far Plan to d/c to [facility] tomorrow.  Discussed all the above with pt.  Ruta Hinds, MD [facility] Office: [phone] Pager: [phone]  Laboratory Lab Results:  Recent Labs  05/07/14 0417 05/08/14 0500  WBC 10.1 9.3  HGB 9.6* 9.1*  HCT 30.5* 28.8*  PLT 383 364   BMET  Recent Labs  05/07/14 0417 05/08/14 0500  NA 141 137  K 4.3 4.4  CL 96 96  CO2 30 29  GLUCOSE 98 121*  BUN 9 25*  CREATININE 4.06* 7.20*  CALCIUM 8.5 8.3*    COAG Lab Results  Component Value Date   INR 1.08 05/06/2014   INR 1.03 06/20/2013   INR 0.97 09/06/2012   No results found for: PTT

## 2014-05-09 NOTE — Progress Notes (Signed)
Utilization review completed.  

## 2014-05-09 NOTE — Progress Notes (Signed)
Orthopedic Tech Progress Note Patient Details:  Nicole [name] 17-May-1932 [id number] [employer] called for brace Patient ID: [name], female   DOB: 1932/09/07, 81 y.o.   MRN: [id number]   [name] 05/09/2014, 9:42 AM

## 2014-05-09 NOTE — Progress Notes (Signed)
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 PMHx for ESRD, PVD, anemia, AAA from [facility]    PT Comments    Pt with increased mobility today able to tolerate OOB to chair for the first time but pt states fear and pain with all mobility but benefits from encouragement and reassurance throughout. Pt comfortable and calm once in chair with niece present throughout session and nursing staff educated for transfer sequence. Will continue to follow and educated [name] for bil LE HEP and encouraged throughout the day particularly RLE as well as rubbing limb for desensitization.   Follow Up Recommendations  SNF;Supervision/Assistance - 24 hour     Equipment Recommendations       Recommendations for Other Services       Precautions / Restrictions Precautions Precautions: Fall Precaution Comments: R AKA Restrictions Weight Bearing Restrictions: Yes RLE Weight Bearing: Non weight bearing    Mobility  Bed Mobility Overal bed mobility: Needs Assistance Bed Mobility: Rolling;Sidelying to Sit Rolling: Mod assist Sidelying to sit: Mod assist       General bed mobility comments: cues for sequence with extensive assist of pad to rotate pelvis with cues for use of rail to pull trunk over with increased time and pt at time resisting and hitting due to fear and pain but benefits from reassurance and encouragement. Mod assist to eleavate trunk from surface. Pt able to scoot to EOB and back in chair with cues and no assist  Transfers Overall transfer level: Needs assistance   Transfers: Sit to/from Stand;Stand Pivot Transfers Sit to Stand: Mod assist;+2 physical assistance Stand pivot transfers: Mod assist;+2 physical assistance       General transfer comment: bil UE hooked and use of belt for stand pivot on LLE  without buckling, max cues and reassurance throughout  Ambulation/Gait                 Stairs            Wheelchair Mobility    Modified Rankin (Stroke Patients Only)       Balance     Sitting balance-Leahy Scale: Fair                              Cognition Arousal/Alertness: Awake/alert Behavior During Therapy: Flat affect Overall Cognitive Status: Within Functional Limits for tasks assessed                      Exercises General Exercises - Lower Extremity Long Arc Quad: AROM;Left;20 reps;Seated Hip Flexion/Marching: AROM;Left;15 reps;Seated Amputee Exercises Hip Extension: AROM;Right;10 reps;Seated Hip ABduction/ADduction: AAROM;Seated;Right;10 reps    General Comments        Pertinent Vitals/Pain Pain Location: R LE with rolling, unrated, none at rest Pain Intervention(s): Limited activity within patient's tolerance;Repositioned;RN gave pain meds during session    Home Living                      Prior Function            PT Goals (current goals can now be found in the care plan section) Progress towards PT goals: Progressing toward goals    Frequency       PT Plan Current plan remains appropriate    Co-evaluation  End of Session Equipment Utilized During Treatment: Gait belt Activity Tolerance: Patient limited by pain Patient left: in chair;with call bell/phone within reach;with family/visitor present     Time: 0721-8288 PT Time Calculation (min) (ACUTE ONLY): 24 min  Charges:  $Therapeutic Exercise: 8-22 mins $Therapeutic Activity: 8-22 mins                    G Codes:      Melford Aase 05/25/14, 10:29 AM Elwyn Reach, [place]

## 2014-05-09 NOTE — Progress Notes (Signed)
OT Cancellation Note  Patient Details Name: Nicole [name] MRN: [id number] DOB: [birth year]   Cancelled Treatment:    Reason Eval/Treat Not Completed: Patient declined, no reason specified.  [name] 05/09/2014, 2:46 PM

## 2014-05-09 NOTE — Progress Notes (Signed)
Subjective: Interval History: has complaints still much discomfort in stump.  Objective: Vital signs in last 24 hours: Temp:  [98.8 F (37.1 C)-99.7 F (37.6 C)] 99.6 F (37.6 C) (03/03 0548) Pulse Rate:  [52-97] 80 (03/03 0548) Resp:  [18-23] 18 (03/03 0548) BP: (106-182)/(45-73) 145/50 mmHg (03/03 0548) SpO2:  [95 %-96 %] 95 % (03/03 0548) Weight:  [50.9 kg (112 lb 3.4 oz)-51.9 kg (114 lb 6.7 oz)] 50.9 kg (112 lb 3.4 oz) (03/02 2043) Weight change:   Intake/Output from previous day: 03/02 0701 - 03/03 0700 In: 200 [P.O.:200] Out: 1000  Intake/Output this shift: Total I/O In: -  Out: 1000 [Other:1000]  General appearance: alert, cooperative and mild distress Resp: diminished breath sounds bilaterally Cardio: S1, S2 normal and systolic murmur: holosystolic 2/6, blowing at apex GI: pos bs, liver down 5 cm Extremities: R amp, RIJ PC  Lab Results:  Recent Labs  05/07/14 0417 05/08/14 0500  WBC 10.1 9.3  HGB 9.6* 9.1*  HCT 30.5* 28.8*  PLT 383 364   BMET:  Recent Labs  05/07/14 0417 05/08/14 0500  NA 141 137  K 4.3 4.4  CL 96 96  CO2 30 29  GLUCOSE 98 121*  BUN 9 25*  CREATININE 4.06* 7.20*  CALCIUM 8.5 8.3*   No results for input(s): PTH in the last 72 hours. Iron Studies: No results for input(s): IRON, TIBC, TRANSFERRIN, FERRITIN in the last 72 hours.  Studies/Results: Ct Angio Ao+bifem W/cm &/or Wo/cm  05/08/2014   CLINICAL DATA:  Severe pain since having a below the knee right leg amputation. Left groin abscess.  EXAM: CT ANGIOGRAPHY OF ABDOMINAL AORTA WITH ILIOFEMORAL RUNOFF  TECHNIQUE: Multidetector CT imaging of the abdomen, pelvis and lower extremities was performed using the standard protocol during bolus administration of intravenous contrast. Multiplanar CT image reconstructions and MIPs were obtained to evaluate the vascular anatomy.  CONTRAST:  120mL OMNIPAQUE IOHEXOL 350 MG/ML SOLN  COMPARISON:  09/11/2012  FINDINGS: Aorta: There is  circumferential mural thrombus in the descending thoracic aorta. There is a focal outpouching along the left side of the descending thoracic aorta on sequence 6, image 45. The outpouching measures 1.0 x 0.5 cm. Atherosclerotic disease with mural thrombus throughout the abdominal aorta. Extensive atherosclerotic disease involving the visceral arteries. Narrowing of the proximal celiac trunk may be related to the median arcuate ligament. SMA is patent. Single right renal artery is heavily diseased with mixed plaque. There appears to be two left renal arteries with atherosclerotic disease. There is an the aortobifemoral bypass graft. Evidence for a pseudoaneurysm coming off the proximal left femoral limb on sequence 6, image 237. This pseudoaneurysm measures up to 1.4 cm. In retrospect, this was present on the MRI from 09/05/2012 and previously measured 0.9 cm. Both femoral limbs are patent. There is soft tissue or fluid thickening around the femoral limbs bilaterally. This thickening is most prominent on the left side and measures up to 2 cm on sequence 6, image 295. Similar finding was present on the MRI from 2014.  Right Lower Extremity: There is flow in the right profunda femoral arteries. The proximal right superficial femoral artery is patent with segmental disease in the right SFA. Areas of occlusion and critical stenosis in knee right SFA. The distal right SFA is occluded. There is calcified disease involving the right profunda femoral artery branches. There are skin staples in the right lower thigh related to a recent above the knee amputation. Small amount of subcutaneous gas from the recent  surgery. No evidence for an abscess formation in the right lower extremity.  Left Lower Extremity: There is an open wound in the left groin. There is soft tissue thickening or fluid around the distal left aortofemoral limb. No large abscess formation in this location. The left superficial femoral artery is occluded. There  is flow in the left profunda femoral arteries. Reconstitution at the left adductor canal. The left popliteal artery has segmental disease but it is patent. Atherosclerotic disease involving the left calf runoff vessels. The proximal calf vessels are patent. There is occlusion of the mid posterior tibial artery. Primary runoff is the peroneal artery. The anterior tibial artery occludes in the distal aspect. There is distal reconstitution of the anterior tibial artery and dorsalis pedis artery. There is some distal reconstitution of the posterior tibial artery. Diffuse osteopenia in the tarsal bones. Prominent loose body along the posterior knee joint.  Abdomen and pelvis: There is bibasilar atelectasis at the lung bases. There is no gross abnormality to the liver or gallbladder. No gross abnormality in the spleen. Both kidneys are atrophic. Normal appearance of the adrenal tissue and pancreas. There appears to be calcifications involving the uterus. No gross abnormality to the small or large bowel.  There is a 3.6 x 1.6 cm low-density collection along the medial left psoas muscle. There may be a small amount of fluid along the right psoas muscle on sequence 4, image 92. There is complete loss of the disc space at L2-L3 with extensive sclerosis of the L2 and L3 vertebral bodies. There is also marked narrowing of the central canal at L2-L3. These findings are similar to the prior MRI. There is chronic grade 2 anterolisthesis at L4-L5 related to pars defects at L4.  Review of the MIP images confirms the above findings.  IMPRESSION: The aortobifemoral bypass is patent but there is a 1.4 cm pseudoaneurysm along the anterior aspect of the proximal left limb. In retrospect, this was present on the MRI from 09/05/2012 and measured roughly 0.9 cm on the prior examination. Based on the history of discitis and recurrent psoas abscesses, this pseudoaneurysm could be inflammatory in etiology.  New low density collection in the  left psoas muscle. Findings are concerning for a recurrent left psoas abscess which may reflect recurrent diskitis and osteomyelitis at L2-L3. This may be better characterized with MRI.  There is soft tissue or fluid surrounding the bypass limbs, most prominent on the left. Similar finding was present on the MRI from 2014. This low density fluid or soft tissue around the graft limbs is likely postinflammatory.  There is a soft tissue defect in left groin related to a recent abscess. There is low density soft tissue and fluid around the left groin arterial structures but no evidence for a large undrained abscess collection in the left groin.  Post right leg amputation above the knee. Expected postoperative changes at the amputation site.  Severe atherosclerotic disease in the aorta and lower extremity arteries. Occlusion of the left superficial femoral artery. Left lower extremity runoff disease as described.  Focal dilatation in the descending thoracic aorta as described.  These results will be called to the ordering clinician or representative by the Radiologist Assistant, and communication documented in the PACS or zVision Dashboard.   Electronically Signed   By: Markus Daft M.D.   On: 05/08/2014 16:02    I have reviewed the patient's current medications.  Assessment/Plan: 1 ESRD HD MWF  , doing well 2 Anemia on epo, stable  3 PVD per [facility] 4 HTN better with less vol , lower meds 5 Access deal with in 6-8 wk P HD in am, epo, mobilize, pain control    LOS: 3 days   Halil Rentz L 05/09/2014,6:50 AM

## 2014-05-09 NOTE — Clinical Social Work Psychosocial (Signed)
Clinical Social Work Department BRIEF PSYCHOSOCIAL ASSESSMENT 05/09/2014  Patient:  Nicole [name], Nicole [name]     Account Number:  [id number]     Admit date:  05/06/2014  Clinical Social Worker:  Domenica Reamer, [place]  Date/Time:  05/09/2014 04:33 PM  Referred by:  Physician  Date Referred:  05/06/2014 Referred for  SNF Placement   Other Referral:   Interview type:  Patient Other interview type:    PSYCHOSOCIAL DATA Living Status:  FACILITY Admitted from facility:  [facility] Level of care:  [place] Primary support name:  Richardson Landry Primary support relationship to patient:  FAMILY Degree of support available:   high    CURRENT CONCERNS Current Concerns  Post-Acute Placement   Other Concerns:    SOCIAL WORK ASSESSMENT / PLAN CSW spoke with patient about return to [facility]- pt is agreeable and has lived there for about 2 years- finds [facility] to be supportive environment.   Assessment/plan status:  Psychosocial Support/Ongoing Assessment of Needs Other assessment/ plan:   FL2 update   Information/referral to community resources:    PATIENT'S/FAMILY'S RESPONSE TO PLAN OF CARE: Patient is agreeable to return to [facility] and wishes she were able to return there sooner rather than later.       Domenica Reamer, [place] Social Worker [phone]

## 2014-05-10 ENCOUNTER — Other Ambulatory Visit: Payer: Self-pay | Admitting: *Deleted

## 2014-05-10 DIAGNOSIS — Z992 Dependence on renal dialysis: Secondary | ICD-10-CM

## 2014-05-10 DIAGNOSIS — Z48812 Encounter for surgical aftercare following surgery on the circulatory system: Secondary | ICD-10-CM

## 2014-05-10 DIAGNOSIS — I739 Peripheral vascular disease, unspecified: Secondary | ICD-10-CM

## 2014-05-10 DIAGNOSIS — N186 End stage renal disease: Secondary | ICD-10-CM

## 2014-05-10 LAB — CBC
HEMATOCRIT: 27.5 % — AB (ref 36.0–46.0)
Hemoglobin: 8.5 g/dL — ABNORMAL LOW (ref 12.0–15.0)
MCH: 35.3 pg — ABNORMAL HIGH (ref 26.0–34.0)
MCHC: 30.9 g/dL (ref 30.0–36.0)
MCV: 114.1 fL — AB (ref 78.0–100.0)
Platelets: 283 10*3/uL (ref 150–400)
RBC: 2.41 MIL/uL — ABNORMAL LOW (ref 3.87–5.11)
RDW: 15.2 % (ref 11.5–15.5)
WBC: 8.5 10*3/uL (ref 4.0–10.5)

## 2014-05-10 LAB — RENAL FUNCTION PANEL
Albumin: 2.5 g/dL — ABNORMAL LOW (ref 3.5–5.2)
Anion gap: 13 (ref 5–15)
BUN: 38 mg/dL — ABNORMAL HIGH (ref 6–23)
CALCIUM: 8.7 mg/dL (ref 8.4–10.5)
CO2: 28 mmol/L (ref 19–32)
CREATININE: 6.57 mg/dL — AB (ref 0.50–1.10)
Chloride: 99 mmol/L (ref 96–112)
GFR calc non Af Amer: 5 mL/min — ABNORMAL LOW (ref >90)
GFR, EST AFRICAN AMERICAN: 6 mL/min — AB (ref >90)
Glucose, Bld: 105 mg/dL — ABNORMAL HIGH (ref 70–99)
PHOSPHORUS: 6.2 mg/dL — AB (ref 2.3–4.6)
Potassium: 4.3 mmol/L (ref 3.5–5.1)
SODIUM: 140 mmol/L (ref 135–145)

## 2014-05-10 MED ORDER — DARBEPOETIN ALFA 100 MCG/0.5ML IJ SOSY
100.0000 ug | PREFILLED_SYRINGE | Freq: Once | INTRAMUSCULAR | Status: AC
Start: 2014-05-10 — End: 2014-05-10
  Administered 2014-05-10: 100 ug via SUBCUTANEOUS
  Filled 2014-05-10 (×2): qty 0.5

## 2014-05-10 MED ORDER — HYDROCODONE-ACETAMINOPHEN 10-325 MG PO TABS: 1.0000 | ORAL_TABLET | ORAL | Status: AC | PRN

## 2014-05-10 MED ORDER — DARBEPOETIN ALFA 100 MCG/0.5ML IJ SOSY
100.0000 ug | PREFILLED_SYRINGE | Freq: Once | INTRAMUSCULAR | Status: DC
  Filled 2014-05-10: qty 0.5

## 2014-05-10 MED ORDER — VANCOMYCIN HCL 500 MG IV SOLR
250.0000 mg | Freq: Once | INTRAVENOUS | Status: AC
  Administered 2014-05-10: 250 mg via INTRAVENOUS
  Filled 2014-05-10 (×2): qty 250

## 2014-05-10 MED ORDER — HYDROCODONE-ACETAMINOPHEN 10-325 MG PO TABS: 1.0000 | ORAL_TABLET | ORAL | Status: DC | PRN

## 2014-05-10 MED ORDER — HYDROCODONE-ACETAMINOPHEN 5-325 MG PO TABS
ORAL_TABLET | ORAL | Status: AC
  Filled 2014-05-10: qty 1

## 2014-05-10 MED ORDER — DARBEPOETIN ALFA 100 MCG/0.5ML IJ SOSY: 100.0000 ug | PREFILLED_SYRINGE | INTRAMUSCULAR | Status: DC

## 2014-05-10 MED ORDER — DOXERCALCIFEROL 4 MCG/2ML IV SOLN
INTRAVENOUS | Status: AC
  Filled 2014-05-10: qty 2

## 2014-05-10 MED ORDER — TRAMADOL HCL 50 MG PO TABS: 50.0000 mg | ORAL_TABLET | Freq: Three times a day (TID) | ORAL | Status: AC

## 2014-05-10 NOTE — Progress Notes (Signed)
Medicare Important Message given? YES (If response is "NO", the following Medicare IM given date fields will be blank) Date Medicare IM given:05/10/2014 Medicare IM given by: Evan Mackie 

## 2014-05-10 NOTE — Progress Notes (Signed)
[facility] [place]  Subjective  - leg sore but less sore   Objective 81/44 61 98.5 F (36.9 C) (Oral) 14 96% No intake or output data in the 24 hours ending 05/10/14 0926  Left groin wound cleaner but still some yellow drainage Right AKA healing  Assessment/Planning: To SNF today after dialysis Vanc for 6 weeks on dialysis for left groin Follow up with me in 2 weeks. Will need repeat CT in 6 weeks Wet to dry BID for left groin   Nicole [name],Nicole [name] 05/10/2014 9:26 AM --  Laboratory Lab Results:  Recent Labs  05/08/14 0500 05/10/14 0805  WBC 9.3 8.5  HGB 9.1* 8.5*  HCT 28.8* 27.5*  PLT 364 283   BMET  Recent Labs  05/08/14 0500  NA 137  K 4.4  CL 96  CO2 29  GLUCOSE 121*  BUN 25*  CREATININE 7.20*  CALCIUM 8.3*    COAG Lab Results  Component Value Date   INR 1.08 05/06/2014   INR 1.03 06/20/2013   INR 0.97 09/06/2012   No results found for: PTT

## 2014-05-10 NOTE — Progress Notes (Signed)
OT Cancellation Note  Patient Details Name: Nicole [name] MRN: [id number] DOB: 11/29/1932   Cancelled Treatment:    Reason Eval/Treat Not Completed: Patient at procedure or test/ unavailable (HD)  [name] 05/10/2014, 8:29 AM

## 2014-05-10 NOTE — Procedures (Signed)
I was present at this session.  I have reviewed the session itself and made appropriate changes.  HD via PC, bp in low-mid 100s. tol well  Osamah Schmader L 3/4/20168:30 AM

## 2014-05-10 NOTE — Care Management Note (Signed)
    Page 1 of 1   05/10/2014     3:03:24 PM CARE MANAGEMENT NOTE 05/10/2014  Patient:  Nicole [name], Nicole [name]   Account Number:  [id number]  Date Initiated:  05/10/2014  Documentation initiated by:  Marvetta Gibbons  Subjective/Objective Assessment:   Pt admitted s/p AKA     Action/Plan:   PTA pt lived at [facility] to follow for return to SNF   Anticipated DC Date:  05/10/2014   Anticipated DC Plan:  SKILLED NURSING FACILITY  In-house referral  Clinical Social Worker         Choice offered to / List presented to:             Status of service:  Completed, signed off Medicare Important Message given?  YES (If response is "NO", the following Medicare IM given date fields will be blank) Date Medicare IM given:  05/10/2014 Medicare IM given by:  Whitman Hero Date Additional Medicare IM given:   Additional Medicare IM given by:    Discharge Disposition:  [place]  Per UR Regulation:  Reviewed for med. necessity/level of care/duration of stay  If discussed at [place] of Stay Meetings, dates discussed:    Comments:

## 2014-05-10 NOTE — Discharge Summary (Signed)
[facility] Discharge Summary   Patient ID:  Nicole [name] MRN: [id number] DOB/AGE: Jul 29, 1932 81 y.o.  Admit date: 05/06/2014 Discharge date: 05/10/2014 Date of Surgery: 05/06/2014 Surgeon: Surgeon(s): Elam Dutch, MD  Admission Diagnosis: Peripheral Vascular Disease with Gangrene right foot I70.261  Discharge Diagnoses:  Peripheral Vascular Disease with Gangrene right foot I70.261  Secondary Diagnoses: Past Medical History  Diagnosis Date  . Hypertension   . ESRD (end stage renal disease)   . Peripheral vascular disease     revascularization appx. 14 years ago  . Hypothyroidism   . Hiatal hernia   . Iron deficiency anemia   . Osteoarthritis   . Depression with anxiety   . Pulmonary embolism   . AAA (abdominal aortic aneurysm) 1999    aortic byfem bypass  . Anemia in chronic kidney disease(285.21)   . Spinal stenosis, lumbar region, without neurogenic claudication   . Other specified disease of white blood cells   . Hypopotassemia   . Other abnormal blood chemistry   . Macrocytosis   . Depression   . Anxiety   . Bradycardia   . H/O prolonged Q-T interval on ECG     Referral to EP    Procedure(s): AMPUTATION ABOVE RIGHT KNEE INCISION AND DRAINAGE ABSCESS LEFT GROIN  Discharged Condition: good  HPI: Patient is a 81 year old female referred for evaluation of gangrene right foot. The patient previously has been deemed not a revascularization candidate due to her essentially nonambulatory status as well as dementia and inability to tolerate an arteriogram procedure. She has some pain in the right foot but is able to take 1 or 2 pain pills each evening and this is controlled. She denies any fever or chills. She has no significant drainage from the foot. She previously had aortobifemoral bypass. At her arteriogram she occluded the left limb of her aortobifem and required emergent thrombectomy of the left limb of her aortobifem. She has end-stage renal  disease and is on dialysis. Her dialysis today is Tuesday Thursday Saturday. She resides at a skilled nursing facility. Other chronic medical problems include spinal stenosis depression and anxiety all of which are currently stable. She basically sits either on the side of the bed or in her wheelchair all day long.     Hospital Course:  Nicole [name] is a 81 y.o. female is S/P  Procedure(s): AMPUTATION ABOVE RIGHT KNEE INCISION AND DRAINAGE ABSCESS LEFT GROIN IV vancomycin is continued after dialysis.  Wet to dry dressing change left groin daily. Nutrition consult performed 05/08/2014 Pt meets criteria for SEVERE MALNUTRITION in the context of CHRONIC ILLNESS as evidenced by estimated energy intake <75% of estimated energy needs for > 1 month and severe muscle wasting per physical exam. INTERVENTION: Provide Resource Breeze once daily (197 ml water), provides 250 kcal and 9 grams of protein Provide Nepro Shakes once daily (173 ml water), provides 425 kcal and 19 grams of protein Provide Pro-stat once daily, provides 100 kcal and 15 grams of protein Provide Magic Cup once daily with dinner, provides 290 kcal and 9 grams of protein Continue Ensure Pudding once daily, provides 170 kcal and 4 grams of protein Provide Snack HS Encourage PO intake 05/08/2014  CTA reviewed. No perigraft air no focal fluid collection. Psoas/disc findings as likely to be chronic as they are acute/subacute Regardless she is in no shape to consider graft removal currently. Will treat with Vancomycin on dialysis for 6 weeks and repeat CT in 4-6 weeks Plan to  d/c to [facility] today. Discussed all the above with pt.  Consults:  Treatment Team:  Placido Sou, MD  Significant Diagnostic Studies: CBC Lab Results  Component Value Date   WBC 8.5 05/10/2014   HGB 8.5* 05/10/2014   HCT 27.5* 05/10/2014   MCV 114.1* 05/10/2014   PLT 283 05/10/2014    BMET    Component Value Date/Time   NA 140 05/10/2014  0805   K 4.3 05/10/2014 0805   CL 99 05/10/2014 0805   CO2 28 05/10/2014 0805   GLUCOSE 105* 05/10/2014 0805   BUN 38* 05/10/2014 0805   CREATININE 6.57* 05/10/2014 0805   CALCIUM 8.7 05/10/2014 0805   CALCIUM 9.1 09/12/2012 1257   GFRNONAA 5* 05/10/2014 0805   GFRAA 6* 05/10/2014 0805   COAG Lab Results  Component Value Date   INR 1.08 05/06/2014   INR 1.03 06/20/2013   INR 0.97 09/06/2012     Disposition:  Discharge to :Skilled nursing facility Discharge Instructions    Call MD for:  redness, tenderness, or signs of infection (pain, swelling, bleeding, redness, odor or green/yellow discharge around incision site)    Complete by:  As directed      Call MD for:  severe or increased pain, loss or decreased feeling  in affected limb(s)    Complete by:  As directed      Call MD for:  temperature >100.5    Complete by:  As directed      Discharge instructions    Complete by:  As directed   Wet to dry dressing left groin daily packing.  Compression sock to right Amputation.     Resume previous diet    Complete by:  As directed             Medication List    STOP taking these medications        oxyCODONE 5 MG immediate release tablet  Commonly known as:  Oxy IR/ROXICODONE      TAKE these medications        acetaminophen 325 MG tablet  Commonly known as:  TYLENOL  Take 650 mg by mouth every 4 (four) hours as needed for mild pain.     amLODipine 10 MG tablet  Commonly known as:  NORVASC  Take 10 mg by mouth at bedtime.     docusate sodium 100 MG capsule  Commonly known as:  COLACE  Take 100 mg by mouth 2 (two) times daily as needed for mild constipation.     hydrALAZINE 25 MG tablet  Commonly known as:  APRESOLINE  Take 25 mg by mouth See admin instructions. Take 25 mg twice daily after dialysis days on Tuesday Thursday and Saturday     HYDROcodone-acetaminophen 10-325 MG per tablet  Commonly known as:  NORCO  Take 1 tablet by mouth every 4 (four) hours as  needed for moderate pain or severe pain.     levothyroxine 150 MCG tablet  Commonly known as:  SYNTHROID, LEVOTHROID  Take 150 mcg by mouth daily before breakfast.     lisinopril 40 MG tablet  Commonly known as:  PRINIVIL,ZESTRIL  Take 40 mg by mouth at bedtime.     polyethylene glycol packet  Commonly known as:  MIRALAX / GLYCOLAX  Take 17 g by mouth daily as needed for mild constipation or moderate constipation.     traMADol 50 MG tablet  Commonly known as:  ULTRAM  Take 1 tablet (50 mg total) by mouth 3 (  three) times daily.       Verbal and written Discharge instructions given to the patient. Wound care per Discharge AVS   Signed: Laurence Slate [facility] 05/10/2014, 11:36 AM

## 2014-05-10 NOTE — Clinical Social Work Note (Signed)
Clinical Social Worker facilitated patient discharge including contacting patient family and facility to confirm patient discharge plans.  Clinical information faxed to facility and family agreeable with plan.  CSW arranged ambulance transport via [facility] to [place]. RN to call report prior to discharge.  DC packet prepared and on chart for transport.   Clinical Social Worker will sign off for now as social work intervention is no longer needed. Please consult [place] again if new need arises.  Glendon Axe, MSW, LCSWA [phone] 05/10/2014 2:46 PM

## 2014-05-10 NOTE — Progress Notes (Signed)
Subjective: Interval History: has complaints no weak..  Objective: Vital signs in last 24 hours: Temp:  [98.5 F (36.9 C)-99.2 F (37.3 C)] 98.5 F (36.9 C) (03/04 0746) Pulse Rate:  [63-77] 69 (03/04 0746) Resp:  [18-20] 20 (03/04 0746) BP: (109-136)/(32-95) 109/32 mmHg (03/04 0746) SpO2:  [94 %-96 %] 96 % (03/04 0746) Weight:  [52 kg (114 lb 10.2 oz)] 52 kg (114 lb 10.2 oz) (03/04 0746) Weight change:   Intake/Output from previous day:   Intake/Output this shift:    General appearance: cooperative, pale and slowed mentation Resp: clear to auscultation bilaterally Chest wall: RIJ cath Cardio: S1, S2 normal and systolic murmur: holosystolic 2/6, blowing at apex GI: pos bs, soft, liver down 4 cm, mild tender Extremities: RAKA  Lab Results:  Recent Labs  05/08/14 0500  WBC 9.3  HGB 9.1*  HCT 28.8*  PLT 364   BMET:  Recent Labs  05/08/14 0500  NA 137  K 4.4  CL 96  CO2 29  GLUCOSE 121*  BUN 25*  CREATININE 7.20*  CALCIUM 8.3*   No results for input(s): PTH in the last 72 hours. Iron Studies: No results for input(s): IRON, TIBC, TRANSFERRIN, FERRITIN in the last 72 hours.  Studies/Results: Ct Angio Ao+bifem W/cm &/or Wo/cm  05/08/2014   CLINICAL DATA:  Severe pain since having a below the knee right leg amputation. Left groin abscess.  EXAM: CT ANGIOGRAPHY OF ABDOMINAL AORTA WITH ILIOFEMORAL RUNOFF  TECHNIQUE: Multidetector CT imaging of the abdomen, pelvis and lower extremities was performed using the standard protocol during bolus administration of intravenous contrast. Multiplanar CT image reconstructions and MIPs were obtained to evaluate the vascular anatomy.  CONTRAST:  187mL OMNIPAQUE IOHEXOL 350 MG/ML SOLN  COMPARISON:  09/11/2012  FINDINGS: Aorta: There is circumferential mural thrombus in the descending thoracic aorta. There is a focal outpouching along the left side of the descending thoracic aorta on sequence 6, image 45. The outpouching measures 1.0 x  0.5 cm. Atherosclerotic disease with mural thrombus throughout the abdominal aorta. Extensive atherosclerotic disease involving the visceral arteries. Narrowing of the proximal celiac trunk may be related to the median arcuate ligament. SMA is patent. Single right renal artery is heavily diseased with mixed plaque. There appears to be two left renal arteries with atherosclerotic disease. There is an the aortobifemoral bypass graft. Evidence for a pseudoaneurysm coming off the proximal left femoral limb on sequence 6, image 237. This pseudoaneurysm measures up to 1.4 cm. In retrospect, this was present on the MRI from 09/05/2012 and previously measured 0.9 cm. Both femoral limbs are patent. There is soft tissue or fluid thickening around the femoral limbs bilaterally. This thickening is most prominent on the left side and measures up to 2 cm on sequence 6, image 295. Similar finding was present on the MRI from 2014.  Right Lower Extremity: There is flow in the right profunda femoral arteries. The proximal right superficial femoral artery is patent with segmental disease in the right SFA. Areas of occlusion and critical stenosis in knee right SFA. The distal right SFA is occluded. There is calcified disease involving the right profunda femoral artery branches. There are skin staples in the right lower thigh related to a recent above the knee amputation. Small amount of subcutaneous gas from the recent surgery. No evidence for an abscess formation in the right lower extremity.  Left Lower Extremity: There is an open wound in the left groin. There is soft tissue thickening or fluid around the distal  left aortofemoral limb. No large abscess formation in this location. The left superficial femoral artery is occluded. There is flow in the left profunda femoral arteries. Reconstitution at the left adductor canal. The left popliteal artery has segmental disease but it is patent. Atherosclerotic disease involving the left  calf runoff vessels. The proximal calf vessels are patent. There is occlusion of the mid posterior tibial artery. Primary runoff is the peroneal artery. The anterior tibial artery occludes in the distal aspect. There is distal reconstitution of the anterior tibial artery and dorsalis pedis artery. There is some distal reconstitution of the posterior tibial artery. Diffuse osteopenia in the tarsal bones. Prominent loose body along the posterior knee joint.  Abdomen and pelvis: There is bibasilar atelectasis at the lung bases. There is no gross abnormality to the liver or gallbladder. No gross abnormality in the spleen. Both kidneys are atrophic. Normal appearance of the adrenal tissue and pancreas. There appears to be calcifications involving the uterus. No gross abnormality to the small or large bowel.  There is a 3.6 x 1.6 cm low-density collection along the medial left psoas muscle. There may be a small amount of fluid along the right psoas muscle on sequence 4, image 92. There is complete loss of the disc space at L2-L3 with extensive sclerosis of the L2 and L3 vertebral bodies. There is also marked narrowing of the central canal at L2-L3. These findings are similar to the prior MRI. There is chronic grade 2 anterolisthesis at L4-L5 related to pars defects at L4.  Review of the MIP images confirms the above findings.  IMPRESSION: The aortobifemoral bypass is patent but there is a 1.4 cm pseudoaneurysm along the anterior aspect of the proximal left limb. In retrospect, this was present on the MRI from 09/05/2012 and measured roughly 0.9 cm on the prior examination. Based on the history of discitis and recurrent psoas abscesses, this pseudoaneurysm could be inflammatory in etiology.  New low density collection in the left psoas muscle. Findings are concerning for a recurrent left psoas abscess which may reflect recurrent diskitis and osteomyelitis at L2-L3. This may be better characterized with MRI.  There is soft  tissue or fluid surrounding the bypass limbs, most prominent on the left. Similar finding was present on the MRI from 2014. This low density fluid or soft tissue around the graft limbs is likely postinflammatory.  There is a soft tissue defect in left groin related to a recent abscess. There is low density soft tissue and fluid around the left groin arterial structures but no evidence for a large undrained abscess collection in the left groin.  Post right leg amputation above the knee. Expected postoperative changes at the amputation site.  Severe atherosclerotic disease in the aorta and lower extremity arteries. Occlusion of the left superficial femoral artery. Left lower extremity runoff disease as described.  Focal dilatation in the descending thoracic aorta as described.  These results will be called to the ordering clinician or representative by the Radiologist Assistant, and communication documented in the PACS or zVision Dashboard.   Electronically Signed   By: Markus Daft M.D.   On: 05/08/2014 16:02    I have reviewed the patient's current medications.  Assessment/Plan: 1 ESRD on Hd. Doing well . Needs perm access 2 Anemia  Hb 9.1 on epo 3 HPTH, meds 4 PVD s/p amp 5 Malnutrtion  6 FTT P HD, mobilize ,epo ,vit D    LOS: 4 days   Caio Devera L 05/10/2014,8:31 AM

## 2014-05-10 NOTE — Progress Notes (Signed)
PT Cancellation Note  Patient Details Name: Nicole [name] MRN: [id number] DOB: [birth year]   Cancelled Treatment:    Reason Eval/Treat Not Completed: Patient at procedure or test/unavailable (in HD currently)   Lanetta Inch [facility] 05/10/2014, 7:43 AM Elwyn Reach, [place]

## 2014-05-10 NOTE — Progress Notes (Addendum)
ANTIBIOTIC CONSULT NOTE  Pharmacy Consult for Vancomycin Indication: surgical prophylaxis and drained abscess  No Known Allergies  Patient Measurements: Height: 5\' 2"  (157.5 cm) Weight: 114 lb 10.2 oz (52 kg) IBW/kg (Calculated) : 50.1  Vital Signs: Temp: 98.5 F (36.9 C) (03/04 0746) Temp Source: Oral (03/04 0746) BP: 75/45 mmHg (03/04 0800) Pulse Rate: 65 (03/04 0800) Intake/Output from previous day:   Intake/Output from this shift:    Labs:  Recent Labs  05/08/14 0500 05/10/14 0805  WBC 9.3 8.5  HGB 9.1* 8.5*  PLT 364 283  CREATININE 7.20*  --    Estimated Creatinine Clearance: 4.9 mL/min (by C-G formula based on Cr of 7.2). No results for input(s): VANCOTROUGH, VANCOPEAK, VANCORANDOM, GENTTROUGH, GENTPEAK, GENTRANDOM, TOBRATROUGH, TOBRAPEAK, TOBRARND, AMIKACINPEAK, AMIKACINTROU, AMIKACIN in the last 72 hours.   Microbiology: Recent Results (from the past 720 hour(s))  Culture, routine-abscess     Status: None   Collection Time: 05/06/14 11:33 AM  Result Value Ref Range Status   Specimen Description ABSCESS GROIN LEFT  Final   Special Requests NONE  Final   Gram Stain   Final    ABUNDANT WBC PRESENT,BOTH PMN AND MONONUCLEAR NO SQUAMOUS EPITHELIAL CELLS SEEN NO ORGANISMS SEEN Performed at [facility]    Culture   Final    NO GROWTH 3 DAYS Performed at [facility]    Report Status 05/09/2014 FINAL  Final   Assessment: 81yo female with history of ESRD on HD TTS PTA who was admitted for R AKA and I&D left groin abscess. She was started on vancomycin for surgical prophylaxis and I&D of L groin abscess.  Here, she has been on HD MWF this week. Note she was loaded with an appropriate vancomycin dose on 2/29 prior to starting HD, but the RN charted "med not available" on 3/1 after patient completed HD (session ran until ~2300 on 2/29). Patient did get dose after HD on 3/2. Suspect her level is low at this time d/t missed dose and load being given  right before an HD session.  WBC wnl, patient is afebrile. Note vascular is planning to continue vancomycin at HD sessions as outpt for 6 weeks.  Goal of Therapy:  Vancomycin pre-HD target level 15-25 mcg/mL Vancomycin post-HD target level 5-15 mcg/mL  Plan:  -vancomycin random level to be drawn post-HD today- spoke with RN in HD about this -ok to give vancomycin 500mg  IV today after HD, suspect she will need an extra dose today when she goes back to the floor to compensate for suspected low level -vancomycin 500mg  IV qHD moving forward- she was on HD TTSat prior to admission, here vancomycin has been ordered MWF d/t her being off schedule.   Tenicia Gural D. Zi Newbury, PharmD, BCPS Clinical Pharmacist Pager: [phone] 05/10/2014 8:54 AM   ADDENDUM Vancomycin random was not drawn this afternoon in HD despite order being entered and RN being informed. Dose of 500mg  WAS given. Spoke with Dr. Jimmy Footman- patient will not go to HD tomorrow, next session is Tuesday.  Based on patient missing a vancomycin dose earlier this week and expected low level, will give her an extra vancomycin 250mg  IV x1 now prior to discharge (confirmed with RN Drue Dun that pt still has IV access and dose is ok to give)  [place] to inform them that she will need a pre-HD vancomycin level drawn on Tuesday 3/8 when she comes in to ensure she is therapeutic.  Asked for an order to be  faxed to them.  Drue Dun also told me that patient was not given Aranesp in HD. Will change to a subQ dose and retime for her to give now prior to discharge.  Tapanga Ottaway D. Vergia Chea, PharmD, BCPS Clinical Pharmacist Pager: [phone] 05/10/2014 1:44 PM

## 2014-05-11 IMAGING — XA IR SHUNTOGRAM/ FISTULAGRAM *R*
1 series · 12 of 24 positions shown · non-contrast
Comparison: none

CLINICAL DATA: End-stage renal disease, decreased access flow rates

[Series 1: run · 12 of 40 slices shown]
[im 2/40]
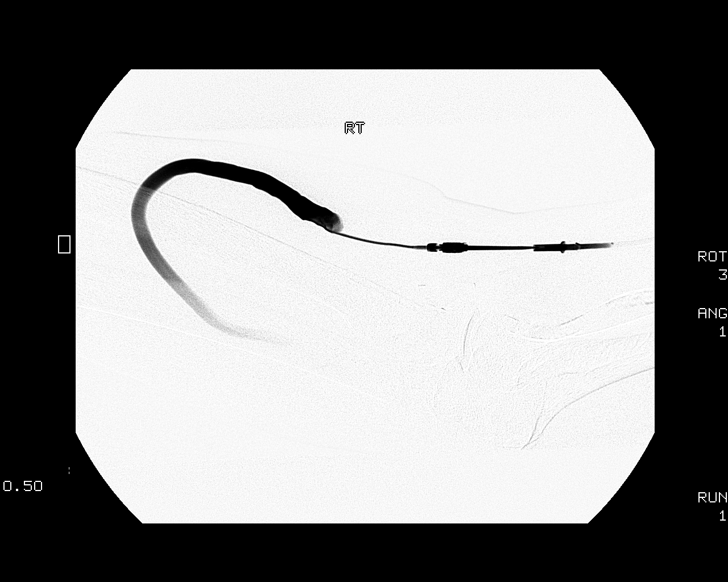
[im 6/40]
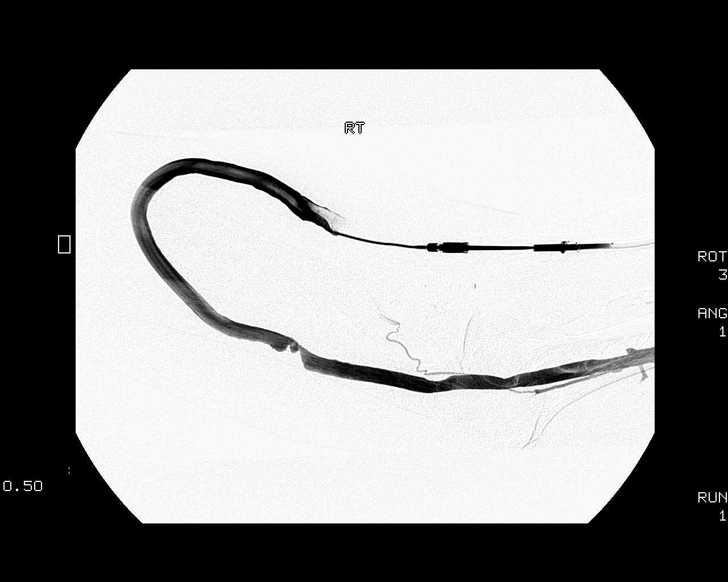
[im 9/40]
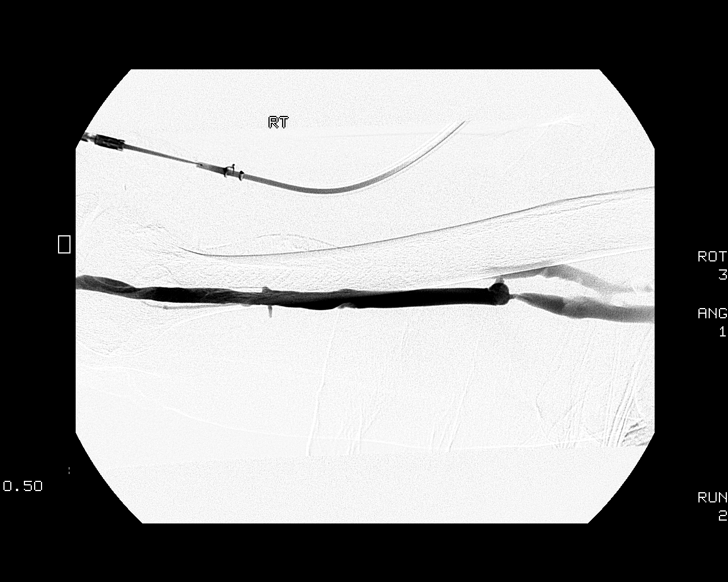
[im 12/40]
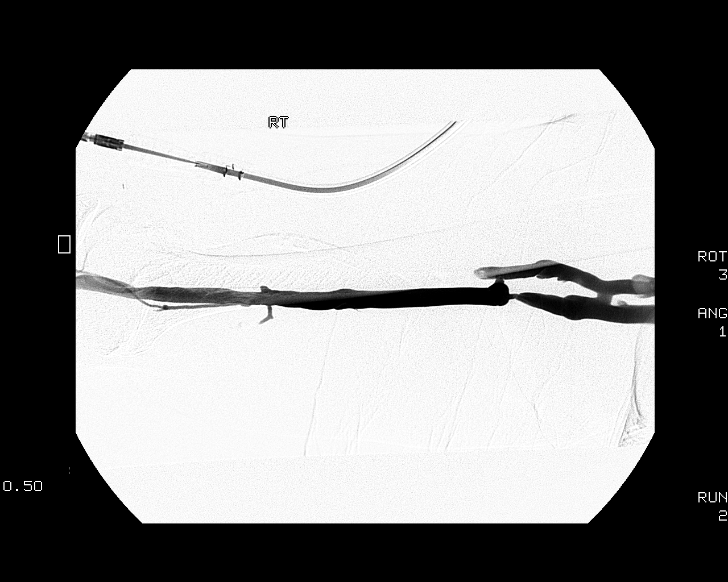
[im 16/40]
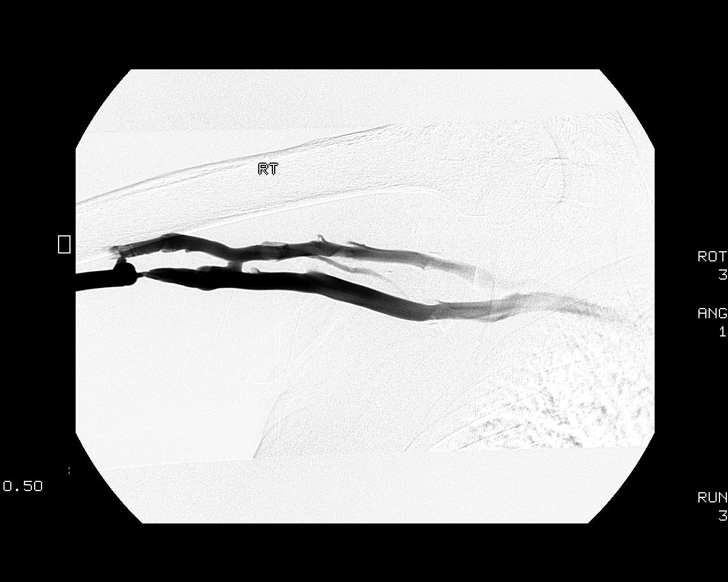
[im 19/40]
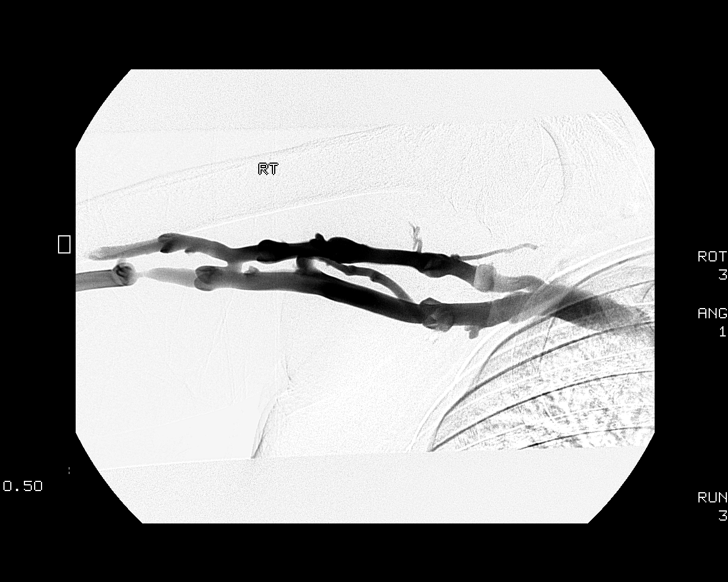
[im 23/40]
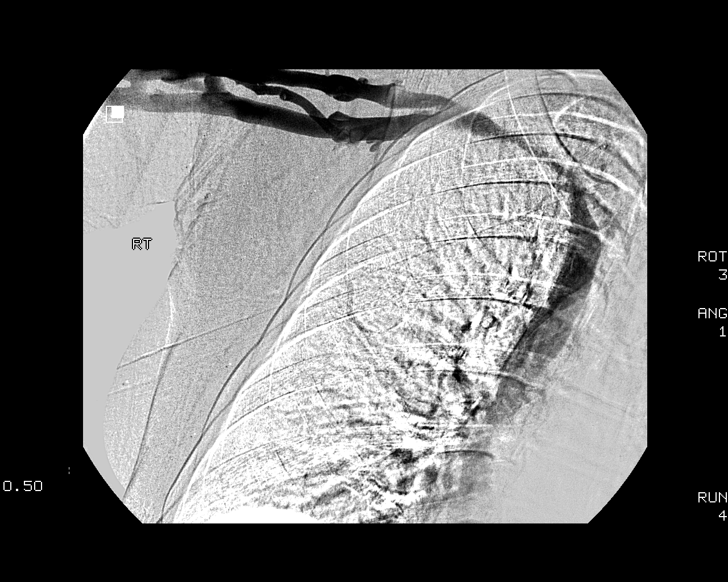
[im 26/40]
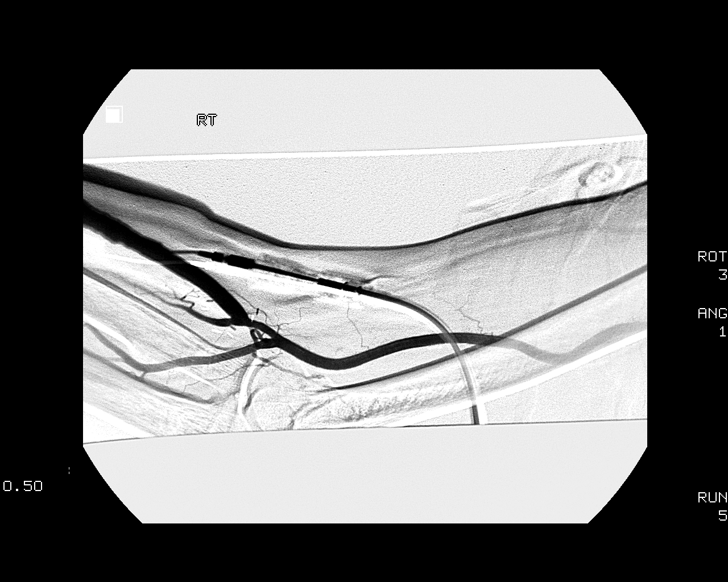
[im 29/40]
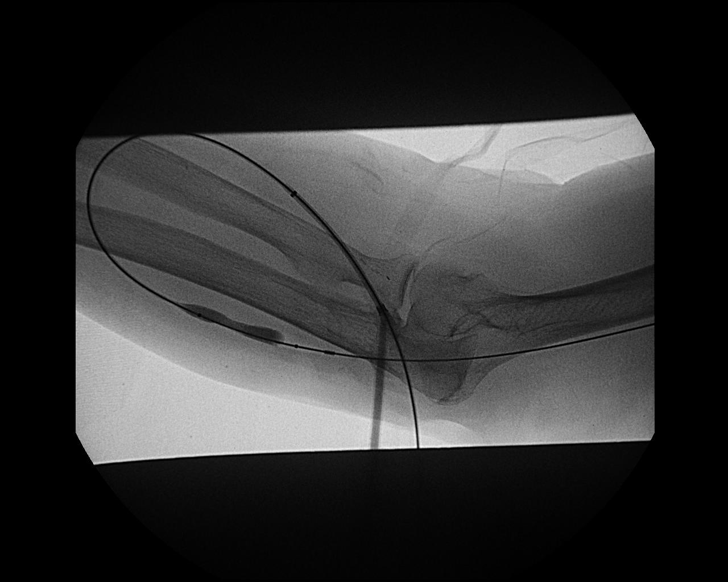
[im 33/40]
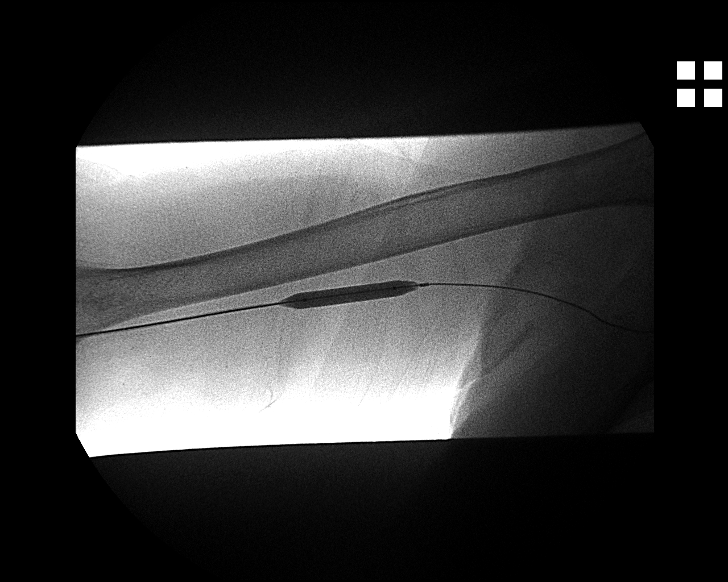
[im 36/40]
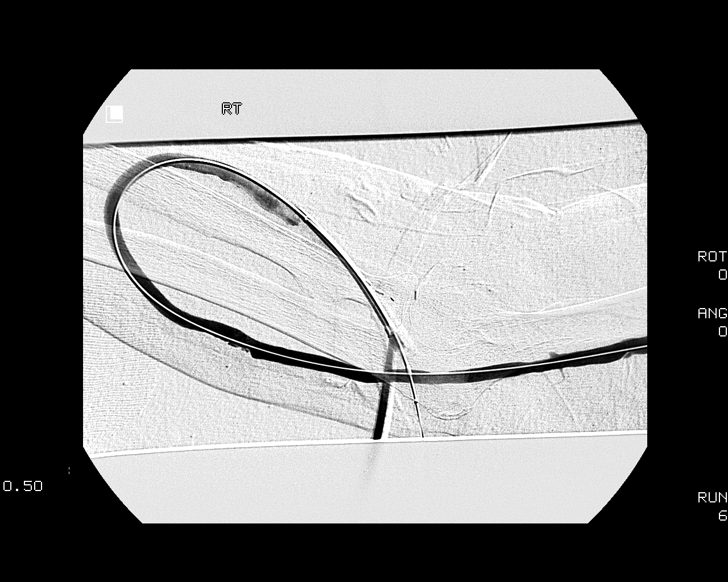
[im 40/40]
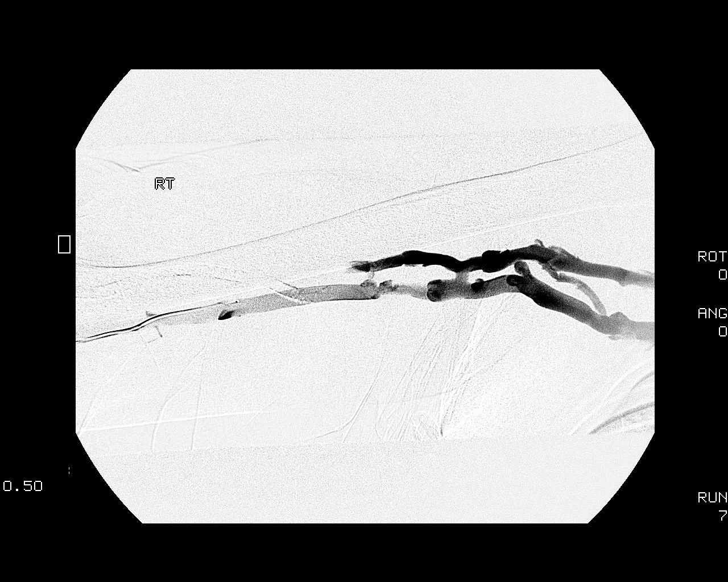

[12 of 24 positions shown; findings below may reference images not displayed]

RIGHT FOREARM AV GRAFT SHUNTOGRAM
7 MM VENOUS ANGIOPLASTY

Date:  12/22/2011 [DATE]

Radiologist:  Atsumu Sawafuji, M.D.

Medications:  1% lidocaine locally

Guidance:  Fluoroscopic

Fluoroscopy time:  2.1 minutes

Sedation time:  None.

Contrast volume:  50 ml 7mnipaque-PRR

Complications:  No immediate

PROCEDURE/FINDINGS:

Informed consent was obtained from the patient following
explanation of the procedure, risks, benefits and alternatives.
The patient understands, agrees and consents for the procedure.
All questions were addressed.  A time out was performed.

Maximal barrier sterile technique utilized including caps, mask,
sterile gowns, sterile gloves, large sterile drape, hand hygiene,
and Chloroprep

Under sterile conditions, the right forearm loop graft was accessed
with an 18 gauge angiocath.  Contrast injection performed for a
complete shuntogram from anastomosis to the right atrium.

Right shuntogram:  Arterial anastomosis to the radial artery is
patent.  Loop graft is patent.  Venous limb intragraft stenosis
noted, greater than 50%.  Outflow veins across the elbow are
patent.  Mid humerus outflow focal venous stenosis also estimated
at greater than 50%.  The axillary, subclavian and innominate veins
are patent.  SVC patent.  No significant central stenosis or
occlusion.

Venous angioplasty:  Under sterile conditions and local anesthesia,
a 6-French sheath was inserted.  Guide wire access obtained across
the intragraft and outflow venous stenoses.  7 mm angioplasty
performed of both sites.  The two inflations performed to 30
atmospheres for 60 seconds.  Balloon was deflated removed.  Final
shuntogram demonstrates minimal residual narrowing of both
treatment sites but no significant stenosis or complication.  There
is brisk graft outflow.

Sheath and guide wire removed.  Hemostasis obtained with
pursestring suture.  No immediate complication.
IMPRESSION: Successful 7 mm angioplasty of a venous limb intragraft stenosis
and a mid humerus venous outflow stenosis.

Access management:  This access remains amenable intervention

## 2014-05-20 ENCOUNTER — Telehealth: Payer: Self-pay | Admitting: Vascular Surgery

## 2014-05-20 NOTE — Telephone Encounter (Signed)
-----   Message from Mena Goes, RN sent at 05/10/2014 12:39 PM EST ----- Regarding: Schedule   ----- Message -----    From: Ulyses Amor, PA-C    Sent: 05/10/2014  11:43 AM      To: [facility] Charge Pool  Will treat with Vancomycin on dialysis for 6 weeks and repeat CTA graft and runoff Left LE in 4-6 weeks  Right AKA, Left groin I & D.  Wet to dry dressing changes left groin.  D/C to SNF.  F/U with Dr. Oneida Alar

## 2014-05-20 NOTE — Telephone Encounter (Signed)
LM for [employer] and mailed CT letter, dpm

## 2014-06-07 ENCOUNTER — Emergency Department (HOSPITAL_COMMUNITY)
Admission: EM | Admit: 2014-06-07 | Discharge: 2014-07-07 | Disposition: E | Payer: Medicare Other | Attending: Emergency Medicine | Admitting: Emergency Medicine

## 2014-06-07 ENCOUNTER — Emergency Department (HOSPITAL_COMMUNITY): Payer: Medicare Other

## 2014-06-07 ENCOUNTER — Encounter (HOSPITAL_COMMUNITY): Payer: Self-pay | Admitting: Emergency Medicine

## 2014-06-07 DIAGNOSIS — Z8739 Personal history of other diseases of the musculoskeletal system and connective tissue: Secondary | ICD-10-CM | POA: Diagnosis not present

## 2014-06-07 DIAGNOSIS — E162 Hypoglycemia, unspecified: Secondary | ICD-10-CM | POA: Diagnosis not present

## 2014-06-07 DIAGNOSIS — Z87891 Personal history of nicotine dependence: Secondary | ICD-10-CM | POA: Diagnosis not present

## 2014-06-07 DIAGNOSIS — I120 Hypertensive chronic kidney disease with stage 5 chronic kidney disease or end stage renal disease: Secondary | ICD-10-CM | POA: Insufficient documentation

## 2014-06-07 DIAGNOSIS — Z992 Dependence on renal dialysis: Secondary | ICD-10-CM | POA: Diagnosis not present

## 2014-06-07 DIAGNOSIS — Z9861 Coronary angioplasty status: Secondary | ICD-10-CM | POA: Insufficient documentation

## 2014-06-07 DIAGNOSIS — E039 Hypothyroidism, unspecified: Secondary | ICD-10-CM | POA: Diagnosis not present

## 2014-06-07 DIAGNOSIS — D631 Anemia in chronic kidney disease: Secondary | ICD-10-CM | POA: Insufficient documentation

## 2014-06-07 DIAGNOSIS — E872 Acidosis, unspecified: Secondary | ICD-10-CM

## 2014-06-07 DIAGNOSIS — N186 End stage renal disease: Secondary | ICD-10-CM | POA: Insufficient documentation

## 2014-06-07 DIAGNOSIS — Z86711 Personal history of pulmonary embolism: Secondary | ICD-10-CM | POA: Diagnosis not present

## 2014-06-07 DIAGNOSIS — I469 Cardiac arrest, cause unspecified: Secondary | ICD-10-CM | POA: Diagnosis not present

## 2014-06-07 DIAGNOSIS — Z8719 Personal history of other diseases of the digestive system: Secondary | ICD-10-CM | POA: Diagnosis not present

## 2014-06-07 DIAGNOSIS — E875 Hyperkalemia: Secondary | ICD-10-CM

## 2014-06-07 DIAGNOSIS — Z8659 Personal history of other mental and behavioral disorders: Secondary | ICD-10-CM | POA: Diagnosis not present

## 2014-06-07 LAB — CBC WITH DIFFERENTIAL/PLATELET
Basophils Absolute: 0.2 10*3/uL — ABNORMAL HIGH (ref 0.0–0.1)
Basophils Relative: 1 % (ref 0–1)
EOS PCT: 0 % (ref 0–5)
Eosinophils Absolute: 0 10*3/uL (ref 0.0–0.7)
HCT: 27.8 % — ABNORMAL LOW (ref 36.0–46.0)
Hemoglobin: 8.3 g/dL — ABNORMAL LOW (ref 12.0–15.0)
LYMPHS ABS: 1.3 10*3/uL (ref 0.7–4.0)
LYMPHS PCT: 8 % — AB (ref 12–46)
MCH: 33.6 pg (ref 26.0–34.0)
MCHC: 29.9 g/dL — ABNORMAL LOW (ref 30.0–36.0)
MCV: 112.6 fL — AB (ref 78.0–100.0)
MONOS PCT: 10 % (ref 3–12)
Monocytes Absolute: 1.6 10*3/uL — ABNORMAL HIGH (ref 0.1–1.0)
NEUTROS PCT: 81 % — AB (ref 43–77)
Neutro Abs: 12.8 10*3/uL — ABNORMAL HIGH (ref 1.7–7.7)
PLATELETS: 44 10*3/uL — AB (ref 150–400)
RBC: 2.47 MIL/uL — AB (ref 3.87–5.11)
RDW: 16 % — ABNORMAL HIGH (ref 11.5–15.5)
WBC: 15.9 10*3/uL — AB (ref 4.0–10.5)

## 2014-06-07 LAB — BASIC METABOLIC PANEL
Anion gap: 35 — ABNORMAL HIGH (ref 5–15)
BUN: 43 mg/dL — ABNORMAL HIGH (ref 6–23)
CALCIUM: 11.4 mg/dL — AB (ref 8.4–10.5)
CO2: 9 mmol/L — CL (ref 19–32)
Chloride: 98 mmol/L (ref 96–112)
Creatinine, Ser: 6.01 mg/dL — ABNORMAL HIGH (ref 0.50–1.10)
GFR, EST AFRICAN AMERICAN: 7 mL/min — AB (ref >90)
GFR, EST NON AFRICAN AMERICAN: 6 mL/min — AB (ref >90)
GLUCOSE: 411 mg/dL — AB (ref 70–99)
Potassium: >7.5 mmol/L (ref 3.5–5.1)
SODIUM: 142 mmol/L (ref 135–145)

## 2014-06-07 LAB — I-STAT CHEM 8, ED
BUN: 57 mg/dL — AB (ref 6–23)
CALCIUM ION: 1.22 mmol/L (ref 1.13–1.30)
CREATININE: 5.9 mg/dL — AB (ref 0.50–1.10)
Chloride: 103 mmol/L (ref 96–112)
Glucose, Bld: 393 mg/dL — ABNORMAL HIGH (ref 70–99)
HCT: 31 % — ABNORMAL LOW (ref 36.0–46.0)
HEMOGLOBIN: 10.5 g/dL — AB (ref 12.0–15.0)
Potassium: 7.9 mmol/L (ref 3.5–5.1)
Sodium: 135 mmol/L (ref 135–145)
TCO2: 8 mmol/L (ref 0–100)

## 2014-06-07 LAB — I-STAT ARTERIAL BLOOD GAS, ED
Acid-base deficit: 27 mmol/L — ABNORMAL HIGH (ref 0.0–2.0)
BICARBONATE: 3.5 meq/L — AB (ref 20.0–24.0)
O2 Saturation: 75 %
PCO2 ART: 19.9 mmHg — AB (ref 35.0–45.0)
PH ART: 6.842 — AB (ref 7.350–7.450)
Patient temperature: 95.3
pO2, Arterial: 63 mmHg — ABNORMAL LOW (ref 80.0–100.0)

## 2014-06-07 LAB — CBG MONITORING, ED: Glucose-Capillary: <10 mg/dL — CL (ref 70–99)

## 2014-06-07 LAB — I-STAT TROPONIN, ED: TROPONIN I, POC: 12.06 ng/mL — AB (ref 0.00–0.08)

## 2014-06-07 LAB — I-STAT CG4 LACTIC ACID, ED

## 2014-06-07 MED ORDER — ALBUTEROL (5 MG/ML) CONTINUOUS INHALATION SOLN
10.0000 mg/h | INHALATION_SOLUTION | RESPIRATORY_TRACT | Status: DC
  Administered 2014-06-07: 10 mg/h via RESPIRATORY_TRACT
  Filled 2014-06-07: qty 20

## 2014-06-07 MED ORDER — ETOMIDATE 2 MG/ML IV SOLN
20.0000 mg | Freq: Once | INTRAVENOUS | Status: AC
  Administered 2014-06-07: 20 mg via INTRAVENOUS

## 2014-06-07 MED ORDER — EPINEPHRINE HCL 0.1 MG/ML IJ SOSY
PREFILLED_SYRINGE | INTRAMUSCULAR | Status: AC | PRN
  Administered 2014-06-07 (×2): 1 mg via INTRAVENOUS

## 2014-06-07 MED ORDER — SODIUM CHLORIDE 0.9 % IV SOLN: 1.0000 g | Freq: Once | INTRAVENOUS | Status: DC

## 2014-06-07 MED ORDER — DEXTROSE 50 % IV SOLN
INTRAVENOUS | Status: AC
  Filled 2014-06-07: qty 50

## 2014-06-07 MED ORDER — DEXTROSE 10 % IV SOLN
INTRAVENOUS | Status: DC
  Administered 2014-06-07: 21:00:00 via INTRAVENOUS

## 2014-06-07 MED ORDER — DEXTROSE 5 % IV SOLN
0.5000 ug/min | INTRAVENOUS | Status: DC
  Administered 2014-06-07: 5 ug/min via INTRAVENOUS
  Filled 2014-06-07: qty 4

## 2014-06-07 MED ORDER — CALCIUM CHLORIDE 10 % IV SOLN
INTRAVENOUS | Status: AC | PRN
  Administered 2014-06-07: 1 g via INTRAVENOUS

## 2014-06-07 MED ORDER — DEXTROSE 50 % IV SOLN
50.0000 mL | Freq: Once | INTRAVENOUS | Status: AC
  Administered 2014-06-07: 50 mL via INTRAVENOUS

## 2014-06-07 MED ORDER — EPINEPHRINE HCL 0.1 MG/ML IJ SOSY
1.0000 mg | PREFILLED_SYRINGE | Freq: Once | INTRAMUSCULAR | Status: AC
  Administered 2014-06-07: 1 mg via INTRAVENOUS

## 2014-06-07 MED ORDER — CALCIUM CHLORIDE 10 % IV SOLN
1.0000 g | Freq: Once | INTRAVENOUS | Status: AC
  Administered 2014-06-07: 1 g via INTRAVENOUS

## 2014-06-07 MED ORDER — ROCURONIUM BROMIDE 50 MG/5ML IV SOLN
70.0000 mg | Freq: Once | INTRAVENOUS | Status: AC
  Administered 2014-06-07: 70 mg via INTRAVENOUS

## 2014-06-07 MED ORDER — DEXTROSE 50 % IV SOLN
INTRAVENOUS | Status: AC | PRN
  Administered 2014-06-07: 50 mL via INTRAVENOUS

## 2014-06-07 MED ORDER — SODIUM CHLORIDE 0.9 % IV BOLUS (SEPSIS)
1000.0000 mL | Freq: Once | INTRAVENOUS | Status: AC
  Administered 2014-06-07: 1000 mL via INTRAVENOUS

## 2014-06-07 NOTE — ED Notes (Signed)
Critical care MD at bedside 

## 2014-06-07 NOTE — ED Notes (Signed)
Dr Reather Converse given a copy of lactic acid, chem 8 and troponin results

## 2014-06-07 NOTE — ED Provider Notes (Signed)
CSN: [id number]     Arrival date & time 06/26/2014  2005 History   First MD Initiated Contact with Patient 07/04/2014 2020     Chief Complaint  Patient presents with  . Cardiac Arrest     (Consider location/radiation/quality/duration/timing/severity/associated sxs/prior Treatment) Patient is a 81 y.o. female presenting with altered mental status. The history is provided by the EMS personnel. The history is limited by the condition of the patient (unresponsive).  Altered Mental Status Presenting symptoms: confusion and unresponsiveness   Severity:  Severe Most recent episode:  Today Episode history:  Single Timing:  Constant Progression:  Worsening Chronicity:  New Context: nursing home resident   Associated symptoms: no nausea and no vomiting     Past Medical History  Diagnosis Date  . Hypertension   . ESRD (end stage renal disease)   . Peripheral vascular disease     revascularization appx. 14 years ago  . Hypothyroidism   . Hiatal hernia   . Iron deficiency anemia   . Osteoarthritis   . Depression with anxiety   . Pulmonary embolism   . AAA (abdominal aortic aneurysm) 1999    aortic byfem bypass  . Anemia in chronic kidney disease(285.21)   . Spinal stenosis, lumbar region, without neurogenic claudication   . Other specified disease of white blood cells   . Hypopotassemia   . Other abnormal blood chemistry   . Macrocytosis   . Depression   . Anxiety   . Bradycardia   . H/O prolonged Q-T interval on ECG     Referral to EP   Past Surgical History  Procedure Laterality Date  . Abdominal surgery  about 14 years ago    REVASCULARIZATION  . Ateriovenous graft  04/18/2009    LEFT FOREARM avg  . Av fistula placement, radiocephalic  54/98/26  . Avgg removal  12/02/09    SECONDARY TO INFECTION LT FOREARM  . Av fistula placement, radiocephalic  06/06/56    NONMATURED  . Arteriovenous graft placement  07/24/10    RIGHT FOREARM LOOP  . Dg av dialysis graft declot or  12/05/10,  6/17/123, 09/24/11, 01/26/12    CLOTTED GRAFT  . Angioplasty  12/21/11    of RUE AVG  . Thrombectomy femoral artery Left 06/20/2013    Procedure: THROMBECTOMY FEMORAL ARTERY;  Surgeon: Elam Dutch, MD;  Location: [place];  Service: Vascular;  Laterality: Left;  . Abdominal aortagram N/A 06/20/2013    Procedure: ABDOMINAL Maxcine Ham;  Surgeon: Serafina Mitchell, MD;  Location[facility] C[facility] LAB;  Service: Cardiovascular;  Laterality: N/A;  . Above knee leg amputation Right 05/06/2014    DR FIELDS  . Amputation Right 05/06/2014    Procedure: AMPUTATION ABOVE RIGHT KNEE;  Surgeon: Elam Dutch, MD;  Location: [place];  Service: Vascular;  Laterality: Right;  . Incision and drainage abscess Left 05/06/2014    Procedure: INCISION AND DRAINAGE ABSCESS LEFT GROIN;  Surgeon: Elam Dutch, MD;  Location: [place];  Service: Vascular;  Laterality: Left;   No family history on file. History  Substance Use Topics  . Smoking status: Former Smoker -- 0.30 packs/day for 45 years    Quit date: 01/07/2012  . Smokeless tobacco: Never Used  . Alcohol Use: No   OB History    No data available     Review of Systems  Unable to perform ROS: Patient unresponsive  Gastrointestinal: Negative for nausea and vomiting.  Psychiatric/Behavioral: Positive for confusion.      Allergies  Review of patient's allergies indicates no known allergies.  Home Medications   Prior to Admission medications   Medication Sig Start Date End Date Taking? Authorizing Provider  acetaminophen (TYLENOL) 325 MG tablet Take 650 mg by mouth every 4 (four) hours as needed for mild pain.    Yes Historical Provider, MD  amLODipine (NORVASC) 10 MG tablet Take 10 mg by mouth at bedtime.   Yes Historical Provider, MD  hydrALAZINE (APRESOLINE) 25 MG tablet Take 25 mg by mouth See admin instructions. Take 25 mg twice daily after dialysis days on Tuesday Thursday and Saturday   Yes Historical Provider, MD  HYDROcodone-acetaminophen  (NORCO/VICODIN) 5-325 MG per tablet Take 1 tablet by mouth every 6 (six) hours as needed for moderate pain.   Yes Historical Provider, MD  levothyroxine (SYNTHROID, LEVOTHROID) 150 MCG tablet Take 150 mcg by mouth daily before breakfast.   Yes Historical Provider, MD  lisinopril (PRINIVIL,ZESTRIL) 40 MG tablet Take 40 mg by mouth at bedtime.   Yes Historical Provider, MD  Nutritional Supplements (NUTRITIONAL SUPPLEMENT PO) Take 1 each by mouth 3 (three) times daily. Magicup   Yes Historical Provider, MD  traMADol (ULTRAM) 50 MG tablet Take 1 tablet (50 mg total) by mouth 3 (three) times daily. 05/10/14  Yes Ulyses Amor, PA-C  docusate sodium (COLACE) 100 MG capsule Take 100 mg by mouth 2 (two) times daily as needed for mild constipation.    Historical Provider, MD  HYDROcodone-acetaminophen (NORCO) 10-325 MG per tablet Take 1 tablet by mouth every 4 (four) hours as needed for moderate pain or severe pain. Patient not taking: Reported on 06/10/2014 05/10/14   Ulyses Amor, PA-C  polyethylene glycol [facility] / Floria Raveling) packet Take 17 g by mouth daily as needed for mild constipation or moderate constipation.    Historical Provider, MD   BP 128/85 mmHg  Pulse 51  Temp(Src) 95.3 F (35.2 C) (Rectal)  Resp 14  Ht 5\' 3"  (1.6 m)  Wt 112 lb (50.803 kg)  BMI 19.84 kg/m2  SpO2 75% Physical Exam  Constitutional: She appears well-developed. She appears distressed.  Julius Bowels airway in place  HENT:  Head: Normocephalic and atraumatic.  Eyes: Pupils are equal, round, and reactive to light.  Pupils 4 mm and reactive  Cardiovascular: Normal heart sounds and intact distal pulses.   No murmur heard. Sinus arrhythmia, bradycardia  Pulmonary/Chest: Breath sounds normal. She is in respiratory distress. She has no wheezes. She has no rales.  Respiratory effort contact with assisted breaths  Abdominal: Soft. She exhibits no distension. There is no tenderness.  Musculoskeletal:  Right above-the-knee  amputation. Left lower extremity atraumatic without edema.  Neurological: She is unresponsive. GCS eye subscore is 1. GCS verbal subscore is 1. GCS motor subscore is 1.  Skin: Skin is warm and dry. No rash noted.  Nursing note and vitals reviewed.   ED Course  INTUBATION Date/Time: 06/29/14 12:02 AM Performed by: Larence Penning Authorized by: Larence Penning Consent: The procedure was performed in an emergent situation. Indications: respiratory failure Intubation method: video-assisted Patient status: paralyzed (RSI) Preoxygenation: BVM Sedatives: etomidate Paralytic: rocuronium Laryngoscope size: Mac 3 (glidescope 3) Tube size: 7.5 mm Tube type: cuffed Number of attempts: 1 Cricoid pressure: no Cords visualized: yes Post-procedure assessment: chest rise and ETCO2 monitor Breath sounds: equal Cuff inflated: yes ETT to lip: 24 cm Tube secured with: ETT holder Chest x-ray interpreted by me. Chest x-ray findings: endotracheal tube in appropriate position Patient tolerance: Patient tolerated the procedure well  with no immediate complications   (including critical care time) Labs Review Labs Reviewed  CBC WITH DIFFERENTIAL/PLATELET - Abnormal; Notable for the following:    WBC 15.9 (*)    RBC 2.47 (*)    Hemoglobin 8.3 (*)    HCT 27.8 (*)    MCV 112.6 (*)    MCHC 29.9 (*)    RDW 16.0 (*)    Platelets 44 (*)    Neutrophils Relative % 81 (*)    Lymphocytes Relative 8 (*)    Neutro Abs 12.8 (*)    Monocytes Absolute 1.6 (*)    Basophils Absolute 0.2 (*)    All other components within normal limits  BASIC METABOLIC PANEL - Abnormal; Notable for the following:    Potassium >7.5 (*)    CO2 9 (*)    Glucose, Bld 411 (*)    BUN 43 (*)    Creatinine, Ser 6.01 (*)    Calcium 11.4 (*)    GFR calc non Af Amer 6 (*)    GFR calc Af Amer 7 (*)    Anion gap 35 (*)    All other components within normal limits  I-STAT CG4 LACTIC ACID, ED - Abnormal; Notable for the following:     Lactic Acid, Venous >17.00 (*)    All other components within normal limits  I-STAT CHEM 8, ED - Abnormal; Notable for the following:    Potassium 7.9 (*)    BUN 57 (*)    Creatinine, Ser 5.90 (*)    Glucose, Bld 393 (*)    Hemoglobin 10.5 (*)    HCT 31.0 (*)    All other components within normal limits  I-STAT TROPOININ, ED - Abnormal; Notable for the following:    Troponin i, poc 12.06 (*)    All other components within normal limits  CBG MONITORING, ED - Abnormal; Notable for the following:    Glucose-Capillary <10 (*)    All other components within normal limits  CBG MONITORING, ED - Abnormal; Notable for the following:    Glucose-Capillary <10 (*)    All other components within normal limits  I-STAT ARTERIAL BLOOD GAS, ED - Abnormal; Notable for the following:    pH, Arterial 6.842 (*)    pCO2 arterial 19.9 (*)    pO2, Arterial 63.0 (*)    Bicarbonate 3.5 (*)    Acid-base deficit 27.0 (*)    All other components within normal limits  BLOOD GAS, ARTERIAL    Imaging Review Dg Chest Port 1 View  06/25/2014   CLINICAL DATA:  ETT, cardiac arrest  EXAM: PORTABLE CHEST - 1 VIEW  COMPARISON:  06/15/2011  FINDINGS: Endotracheal tube terminates 1.5 cm above the carina.  Multifocal interstitial/ patchy opacities, suggesting interstitial edema, aspiration, or multifocal pneumonia. No pneumothorax.  Right IJ catheter terminates in the upper right atrium.  Cardiomegaly.  Enteric tube terminates in the gastric cardia.  Defibrillator pads overlie the left hemithorax.  IMPRESSION: Endotracheal tube terminates 1.5 cm above the carina.  Multifocal opacities bilaterally, suggesting interstitial edema, aspiration, or multifocal pneumonia.   Electronically Signed   By: Julian Hy M.D.   On: 06/15/2014 21:17     EKG Interpretation None      ED ECG REPORT   Date: 07-08-2014  Rate: 60  Rhythm: sinus arrhythmia  QRS Axis: normal  Intervals: Wide QRS, prolonged PR.  ST/T Wave  abnormalities: nonspecific ST changes  Conduction Disutrbances:right bundle branch block  Narrative Interpretation:   Old EKG  Reviewed: none available  I have personally reviewed the EKG tracing and agree with the computerized printout as noted.  ED ECG REPORT   Date: Jul 05, 2014  Rate: 30  Rhythm: sinus arrhythmia  QRS Axis: normal  Intervals: PR prolonged and QT prolonged  ST/T Wave abnormalities: nonspecific ST changes  Conduction Disutrbances:nonspecific intraventricular conduction delay  Narrative Interpretation:   Old EKG Reviewed: none available  I have personally reviewed the EKG tracing and agree with the computerized printout as noted.  MDM   Final diagnoses:  Hypoglycemia  Hyperkalemia  Lactic acidosis  Death  ESRD (end stage renal disease)   81 year old female with history of end-stage renal disease presents with cardiac arrest. Found hypoglycemic by EMS after low blood sugars at the nursing facility. She also complained of leg pain and chest pain by report. Blood sugar found to be in the 40s on scene and was given D50. The patient then lost pulses and radial and underwent 3 rounds of CPR with epinephrine and King airway placement. Pulses were regained on the dock  On arrival the patient is bradycardic with no wide complex sinus arrhythmia. Pulses palpable but intermittent. King airway was left in place initially and continued resuscitation with push dose epinephrine as well as EKG, glucose, calcium. The patient did lose pulses 1 additional time of initial resuscitation which were again and after 2 rounds of chest compressions with 2 doses of epinephrine. She was briefly stabilized with an epinephrine drip and this allowed intubation with postintubation chest x-ray being acceptable. Following intubation, potassium returned significantly elevated at 7.5. At that point we gave additional calcium as well as added on albuterol for hyperkalemia. Cardiology was present at the  bedside due to concern for inferior ST elevation on scene which had resolved by the time she was arrived here. Additionally, given additional findings including hyperglycemia and hyperkalemia. Critical care as well as nephrology will consult for emergent dialysis and ICU admission. She was started on a D10 drip for maintenance of hypoglycemia and blood sugars stabilize following that. Lactic acid returned greater than 17 and her blood gas returned with a pH of 6.8 and a bicarbonate of 4 which was reflective of her significant metabolic acidosis and critical care.  : The patient remained critically ill throughout her emergency department stay. Critical care was present at the bedside and had a discussion with her son, at which point she was made DO NOT RESUSCITATE. Ongoing resuscitative measures were continued including epinephrine drip, however despite her efforts the patient became asystolic and did not have further CPR efforts as dictated by her son. Time of death was called by the critical care physician at the bedside.  Larence Penning, MD Jul 05, 2014 1791  Elnora Morrison, MD 06/09/14 [phone]

## 2014-06-07 NOTE — ED Notes (Signed)
Patient lost pulses and time of death called by Dr Liane Comber, [place].

## 2014-06-07 NOTE — ED Notes (Signed)
Patient is from [facility].  EMS was called to bring patient to ED for left leg pain and hypoglycemia.  Per EMS, SNF staff has been having a hard time keeping her sugar up during the day.  Patient was given pain meds for the pain in her left leg.  Patient has history of DVT in right leg with amputation.  Patient's CBG was taken by EMS, which was 48 on scene. One amp of D50 given by EMS.  Patient was being moved to ambulance when patient lossed pulses at 1946.  Patient was placed on [place] and regained pulses at 2002 per EMS.  Patient was given a total of 3 epis en route to ED.  Patient airway maintained by [facility] airway.  IV was established 22G at [facility].  EKG showing inferior MI en route to ED.

## 2014-06-07 NOTE — Consult Note (Signed)
PULMONARY  / CRITICAL CARE MEDICINE CONSULTATION   Name: Nicole [name] MRN: [id number] DOB: 1932/07/02    ADMISSION DATE:  06/20/2014 CONSULTATION DATE: 06/10/2014  REQUESTING CLINICIAN: Dr. Harlow Mares PRIMARY SERVICE: ED  CHIEF COMPLAINT:  Cardiac Arrest  BRIEF PATIENT DESCRIPTION: 25yof, SNF resident, ESRD on HD, vascular disease s/p R AKA presents from nursing home with cardiac arrest with ROSC in the field.  Continued to deteriorate in ED and ultimately was made no escalation and then expired.   SIGNIFICANT EVENTS / STUDIES:  06/20/2014: Cardiac arrest with ROSC x 2.  PEA in ED with death.     HISTORY OF PRESENT ILLNESS:  34yof, SNF resident, ESRD on HD, vascular disease s/p R AKA presents from nursing home with cardiac arrest with ROSC in the field.  Hx obtained from EMS and son who was at bedside.  Pt was in usual state of health until the past 24 hours.  Tolerated HD yesterday.  However, today was c/o severe L leg pain and was noted to be increasingly somnolent.  Was noted to be severely hypoglycemic throughout the day as well with BG's <20 at times.  Ultimately, this prompted EMS to be called.  While in route to the ED, the pt arrested with ROSC after 3 rounds of epi.  EKG on arrival here showed inferior NSTEMI.  Pt PEA arrested again after 10 min in the ED with ROSC after one round Epi.  She was started on an epi gtt. Given comorbidities, cooling was not initiated.  We were then contacted for admission.    PAST MEDICAL HISTORY :  Past Medical History  Diagnosis Date  . Hypertension   . ESRD (end stage renal disease)   . Peripheral vascular disease     revascularization appx. 14 years ago  . Hypothyroidism   . Hiatal hernia   . Iron deficiency anemia   . Osteoarthritis   . Depression with anxiety   . Pulmonary embolism   . AAA (abdominal aortic aneurysm) 1999    aortic byfem bypass  . Anemia in chronic kidney disease(285.21)   . Spinal stenosis, lumbar region, without neurogenic  claudication   . Other specified disease of white blood cells   . Hypopotassemia   . Other abnormal blood chemistry   . Macrocytosis   . Depression   . Anxiety   . Bradycardia   . H/O prolonged Q-T interval on ECG     Referral to EP   Past Surgical History  Procedure Laterality Date  . Abdominal surgery  about 14 years ago    REVASCULARIZATION  . Ateriovenous graft  04/18/2009    LEFT FOREARM avg  . Av fistula placement, radiocephalic  40/98/11  . Avgg removal  12/02/09    SECONDARY TO INFECTION LT FOREARM  . Av fistula placement, radiocephalic  11/06/45    NONMATURED  . Arteriovenous graft placement  07/24/10    RIGHT FOREARM LOOP  . Dg av dialysis graft declot or  12/05/10, 6/17/123, 09/24/11, 01/26/12    CLOTTED GRAFT  . Angioplasty  12/21/11    of RUE AVG  . Thrombectomy femoral artery Left 06/20/2013    Procedure: THROMBECTOMY FEMORAL ARTERY;  Surgeon: Elam Dutch, MD;  Location: [place];  Service: Vascular;  Laterality: Left;  . Abdominal aortagram N/A 06/20/2013    Procedure: ABDOMINAL Maxcine Ham;  Surgeon: Serafina Mitchell, MD;  Location: [facility] CATH LAB;  Service: Cardiovascular;  Laterality: N/A;  . Above knee leg amputation Right 05/06/2014  DR FIELDS  . Amputation Right 05/06/2014    Procedure: AMPUTATION ABOVE RIGHT KNEE;  Surgeon: Elam Dutch, MD;  Location: [place];  Service: Vascular;  Laterality: Right;  . Incision and drainage abscess Left 05/06/2014    Procedure: INCISION AND DRAINAGE ABSCESS LEFT GROIN;  Surgeon: Elam Dutch, MD;  Location: [place];  Service: Vascular;  Laterality: Left;   Prior to Admission medications   Medication Sig Start Date End Date Taking? Authorizing Provider  acetaminophen (TYLENOL) 325 MG tablet Take 650 mg by mouth every 4 (four) hours as needed for mild pain.    Yes Historical Provider, MD  amLODipine (NORVASC) 10 MG tablet Take 10 mg by mouth at bedtime.   Yes Historical Provider, MD  hydrALAZINE (APRESOLINE) 25 MG tablet Take 25  mg by mouth See admin instructions. Take 25 mg twice daily after dialysis days on Tuesday Thursday and Saturday   Yes Historical Provider, MD  HYDROcodone-acetaminophen (NORCO/VICODIN) 5-325 MG per tablet Take 1 tablet by mouth every 6 (six) hours as needed for moderate pain.   Yes Historical Provider, MD  levothyroxine (SYNTHROID, LEVOTHROID) 150 MCG tablet Take 150 mcg by mouth daily before breakfast.   Yes Historical Provider, MD  lisinopril (PRINIVIL,ZESTRIL) 40 MG tablet Take 40 mg by mouth at bedtime.   Yes Historical Provider, MD  Nutritional Supplements (NUTRITIONAL SUPPLEMENT PO) Take 1 each by mouth 3 (three) times daily. Magicup   Yes Historical Provider, MD  traMADol (ULTRAM) 50 MG tablet Take 1 tablet (50 mg total) by mouth 3 (three) times daily. 05/10/14  Yes Ulyses Amor, PA-C  docusate sodium (COLACE) 100 MG capsule Take 100 mg by mouth 2 (two) times daily as needed for mild constipation.    Historical Provider, MD  HYDROcodone-acetaminophen (NORCO) 10-325 MG per tablet Take 1 tablet by mouth every 4 (four) hours as needed for moderate pain or severe pain. Patient not taking: Reported on 07/02/2014 05/10/14   Ulyses Amor, PA-C  polyethylene glycol [facility] / Floria Raveling) packet Take 17 g by mouth daily as needed for mild constipation or moderate constipation.    Historical Provider, MD   No Known Allergies  FAMILY HISTORY:  No family history on file. SOCIAL HISTORY:  reports that she quit smoking about 2 years ago. She has never used smokeless tobacco. She reports that she does not drink alcohol or use illicit drugs.  REVIEW OF SYSTEMS:  Unable to obtain 2/2 pt being intubated, sedated  SUBJECTIVE: Unable to obtain 2/2 pt being intubated, sedated  VITAL SIGNS: Temp:  [95.3 F (35.2 C)] 95.3 F (35.2 C) (04/01 2058) Pulse Rate:  [51-101] 51 (04/01 2115) Resp:  [13-35] 14 (04/01 2130) BP: (122-174)/(38-107) 128/85 mmHg (04/01 2130) SpO2:  [55 %-75 %] 75 % (04/01 2130) FiO2  (%):  [100 %] 100 % (04/01 2115) Weight:  [112 lb (50.803 kg)] 112 lb (50.803 kg) (04/01 2150) HEMODYNAMICS:   VENTILATOR SETTINGS: Vent Mode:  [-] PRVC FiO2 (%):  [100 %] 100 % Set Rate:  [14 bmp] 14 bmp Vt Set:  [420 mL] 420 mL PEEP:  [5 cmH20] 5 cmH20 Plateau Pressure:  [20 cmH20] 20 cmH20 INTAKE / OUTPUT: Intake/Output    None     PHYSICAL EXAMINATION: General:  Intubated, nonresponsive Neuro:  Pupils fixed, dilated, arreflexic HEENT:  ETT in place, markedly distended jugulars Cardiovascular:  Loletha Grayer, regular Lungs:  Coarse BS bilaterally Abdomen:  Neg BS Musculoskeletal:  No edema Skin:  Cold extremities, faint pulses on initial  exam  LABS:  CBC  Recent Labs Lab 06/21/2014 2027 07/05/2014 2033  WBC 15.9*  --   HGB 8.3* 10.5*  HCT 27.8* 31.0*  PLT 44*  --    Coag's No results for input(s): APTT, INR in the last 168 hours. BMET  Recent Labs Lab 06/28/2014 2027 06/22/2014 2033  NA 142 135  K >7.5* 7.9*  CL 98 103  CO2 9*  --   BUN 43* 57*  CREATININE 6.01* 5.90*  GLUCOSE 411* 393*   Electrolytes  Recent Labs Lab 06/23/2014 2027  CALCIUM 11.4*   Sepsis Markers  Recent Labs Lab 07/02/2014 2034  LATICACIDVEN >17.00*   ABG  Recent Labs Lab 06/22/2014 2126  PHART 6.842*  PCO2ART 19.9*  PO2ART 63.0*   Liver Enzymes No results for input(s): AST, ALT, ALKPHOS, BILITOT, ALBUMIN in the last 168 hours. Cardiac Enzymes No results for input(s): TROPONINI, PROBNP in the last 168 hours. Glucose  Recent Labs Lab 06/09/2014 2009 06/11/2014 2025  GLUCAP <10* <10*    Imaging Dg Chest Port 1 View  07/04/2014   CLINICAL DATA:  ETT, cardiac arrest  EXAM: PORTABLE CHEST - 1 VIEW  COMPARISON:  06/15/2011  FINDINGS: Endotracheal tube terminates 1.5 cm above the carina.  Multifocal interstitial/ patchy opacities, suggesting interstitial edema, aspiration, or multifocal pneumonia. No pneumothorax.  Right IJ catheter terminates in the upper right atrium.  Cardiomegaly.   Enteric tube terminates in the gastric cardia.  Defibrillator pads overlie the left hemithorax.  IMPRESSION: Endotracheal tube terminates 1.5 cm above the carina.  Multifocal opacities bilaterally, suggesting interstitial edema, aspiration, or multifocal pneumonia.   Electronically Signed   By: Julian Hy M.D.   On: 07/04/2014 21:17    EKG: Sinus, rate in 60's, ST depression in 2,3, AVF  ASSESSMENT / PLAN:   89yof, SNF resident, ESRD on HD, vascular disease s/p R AKA presents from nursing home with cardiac arrest with ROSC in the field.  At the time of my initial exam, the pt had a concerning neuro exam and was extremely unstable on an epi gtt.   pH on ABG was <6.9 and she had a K >8.  Also with cool extremities and distended neck veins c/w poor cardiac function.  During my exam in the ED, the pt became increasingly bradycardic as well.  I had an extensive discussion with the pt's son at the bedside.  We discussed how the likelihood of recovery given her current exam, labs and multiple episodes of CPR was minimal at best.  Ultimately, the son made the decision to make her do not escalate with no further CPR.  I supported this decision.  While awaiting further family arrival in the ED, the pt expired with TOD of 2147.  The patients son was supported by myself and the chaplain.  All questions were answered to the best of my ability.    TODAY'S SUMMARY: Presented to ED s/p cardiac arrest with ROSC in field.  Arrested in ED with ROSC as well.  Made do not escalate after discussion with family.  Expired in ED.    I have personally obtained a history, examined the patient, evaluated laboratory and imaging results, formulated the assessment and plan and placed orders.  CRITICAL CARE: The patient is critically ill with multiple organ systems failure and requires high complexity decision making for assessment and support, frequent evaluation and titration of therapies, application of advanced monitoring  technologies and extensive interpretation of multiple databases. Critical Care Time devoted to patient  care services described in this note is 55 minutes.   Lucrezia Starch, MD Pulmonary and [place] Pager: [phone]   06/22/2014, 11:44 PM

## 2014-06-07 NOTE — ED Notes (Signed)
Son at bedside with [facility].

## 2014-06-20 ENCOUNTER — Other Ambulatory Visit: Payer: Medicare Other

## 2014-06-20 ENCOUNTER — Encounter: Payer: Medicare Other | Admitting: Vascular Surgery

## 2014-07-07 DIAGNOSIS — 419620001 Death: Secondary | SNOMED CT

## 2014-07-07 NOTE — ED Notes (Signed)
To the morgue

## 2014-07-07 NOTE — ED Notes (Signed)
Family has left the department.

## 2014-07-07 NOTE — Progress Notes (Signed)
Pt came in CPR from nursing home.  Son Quamesha Mullet was present.  Pt  Heart stopped at nursing home, emts worked for restart but condition critical.  Pt on dialysis, blood sugar low, potassium high.  Docs told son her condition was critical and she was unlikely to survive.  Son Shandra Szymborski spent time w/ pt who soon expired.  Son very emotional and waited a long time for wife and other family to arrive.  I offered care and concern and prayer which he appreciated.  Pt moved to another room and as family gathered, they began to visit with deceased pt.  Nelva Bush, [place] 2014/06/22 8:51 AM

## 2014-07-07 DEATH — deceased
